# Patient Record
Sex: Male | Born: 1935 | Race: White | Hispanic: No | Marital: Married | Smoking: Former smoker
Health system: Southern US, Community
[De-identification: ages and names within clinical notes are randomized; demographics above are authoritative.]

## PROBLEM LIST (undated history)

## (undated) DIAGNOSIS — IMO0001 Reserved for inherently not codable concepts without codable children: Secondary | ICD-10-CM

## (undated) DIAGNOSIS — J189 Pneumonia, unspecified organism: Secondary | ICD-10-CM

## (undated) DIAGNOSIS — R350 Frequency of micturition: Secondary | ICD-10-CM

## (undated) DIAGNOSIS — R233 Spontaneous ecchymoses: Secondary | ICD-10-CM

## (undated) DIAGNOSIS — R0600 Dyspnea, unspecified: Secondary | ICD-10-CM

## (undated) DIAGNOSIS — R51 Headache: Secondary | ICD-10-CM

## (undated) DIAGNOSIS — R519 Headache, unspecified: Secondary | ICD-10-CM

## (undated) DIAGNOSIS — D649 Anemia, unspecified: Secondary | ICD-10-CM

## (undated) DIAGNOSIS — I25810 Atherosclerosis of coronary artery bypass graft(s) without angina pectoris: Secondary | ICD-10-CM

## (undated) DIAGNOSIS — E785 Hyperlipidemia, unspecified: Secondary | ICD-10-CM

## (undated) DIAGNOSIS — I639 Cerebral infarction, unspecified: Secondary | ICD-10-CM

## (undated) DIAGNOSIS — N390 Urinary tract infection, site not specified: Secondary | ICD-10-CM

## (undated) DIAGNOSIS — H9190 Unspecified hearing loss, unspecified ear: Secondary | ICD-10-CM

## (undated) DIAGNOSIS — R42 Dizziness and giddiness: Secondary | ICD-10-CM

## (undated) DIAGNOSIS — M542 Cervicalgia: Secondary | ICD-10-CM

## (undated) DIAGNOSIS — F03918 Unspecified dementia, unspecified severity, with other behavioral disturbance: Secondary | ICD-10-CM

## (undated) DIAGNOSIS — I10 Essential (primary) hypertension: Secondary | ICD-10-CM

## (undated) DIAGNOSIS — I6529 Occlusion and stenosis of unspecified carotid artery: Secondary | ICD-10-CM

## (undated) DIAGNOSIS — G8929 Other chronic pain: Secondary | ICD-10-CM

## (undated) DIAGNOSIS — F0391 Unspecified dementia with behavioral disturbance: Secondary | ICD-10-CM

## (undated) DIAGNOSIS — R238 Other skin changes: Secondary | ICD-10-CM

## (undated) DIAGNOSIS — R0602 Shortness of breath: Secondary | ICD-10-CM

## (undated) DIAGNOSIS — R569 Unspecified convulsions: Secondary | ICD-10-CM

## (undated) DIAGNOSIS — K219 Gastro-esophageal reflux disease without esophagitis: Secondary | ICD-10-CM

## (undated) DIAGNOSIS — M1990 Unspecified osteoarthritis, unspecified site: Secondary | ICD-10-CM

## (undated) HISTORY — DX: Gastro-esophageal reflux disease without esophagitis: K21.9

## (undated) HISTORY — DX: Atherosclerosis of coronary artery bypass graft(s) without angina pectoris: I25.810

## (undated) HISTORY — PX: COLONOSCOPY: SHX174

## (undated) HISTORY — DX: Anemia, unspecified: D64.9

## (undated) HISTORY — DX: Essential (primary) hypertension: I10

## (undated) HISTORY — DX: Unspecified osteoarthritis, unspecified site: M19.90

## (undated) HISTORY — DX: Occlusion and stenosis of unspecified carotid artery: I65.29

## (undated) HISTORY — DX: Hyperlipidemia, unspecified: E78.5

## (undated) HISTORY — DX: Cerebral infarction, unspecified: I63.9

---
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
## 1966-03-02 HISTORY — PX: APPENDECTOMY: SHX54

## 1996-03-02 HISTORY — PX: CARDIAC CATHETERIZATION: SHX172

## 1996-03-02 HISTORY — PX: CORONARY ARTERY BYPASS GRAFT: SHX141

## 1998-03-12 ENCOUNTER — Ambulatory Visit (HOSPITAL_COMMUNITY): Admission: RE | Admit: 1998-03-12 | Discharge: 1998-03-12 | Payer: Self-pay | Admitting: Gastroenterology

## 1999-03-08 ENCOUNTER — Encounter: Payer: Self-pay | Admitting: Emergency Medicine

## 1999-03-08 ENCOUNTER — Emergency Department (HOSPITAL_COMMUNITY): Admission: EM | Admit: 1999-03-08 | Discharge: 1999-03-08 | Payer: Self-pay | Admitting: Emergency Medicine

## 1999-04-02 ENCOUNTER — Ambulatory Visit (HOSPITAL_COMMUNITY): Admission: RE | Admit: 1999-04-02 | Discharge: 1999-04-02 | Payer: Self-pay

## 2001-10-24 ENCOUNTER — Ambulatory Visit (HOSPITAL_COMMUNITY): Admission: RE | Admit: 2001-10-24 | Discharge: 2001-10-24 | Payer: Self-pay | Admitting: Gastroenterology

## 2004-07-17 ENCOUNTER — Ambulatory Visit: Payer: Self-pay | Admitting: Cardiology

## 2005-09-16 ENCOUNTER — Ambulatory Visit: Payer: Self-pay | Admitting: Cardiology

## 2005-10-01 ENCOUNTER — Ambulatory Visit: Payer: Self-pay

## 2005-11-09 ENCOUNTER — Ambulatory Visit: Payer: Self-pay | Admitting: Cardiology

## 2006-03-10 ENCOUNTER — Ambulatory Visit: Payer: Self-pay

## 2006-03-10 ENCOUNTER — Ambulatory Visit: Payer: Self-pay | Admitting: Cardiology

## 2006-06-30 ENCOUNTER — Ambulatory Visit: Payer: Self-pay | Admitting: Cardiology

## 2007-07-14 ENCOUNTER — Ambulatory Visit: Payer: Self-pay | Admitting: Internal Medicine

## 2007-07-14 ENCOUNTER — Inpatient Hospital Stay (HOSPITAL_COMMUNITY): Admission: EM | Admit: 2007-07-14 | Discharge: 2007-07-19 | Payer: Self-pay | Admitting: Emergency Medicine

## 2007-07-15 ENCOUNTER — Encounter (INDEPENDENT_AMBULATORY_CARE_PROVIDER_SITE_OTHER): Payer: Self-pay | Admitting: Neurology

## 2007-07-18 ENCOUNTER — Ambulatory Visit: Payer: Self-pay | Admitting: *Deleted

## 2007-07-19 ENCOUNTER — Ambulatory Visit: Payer: Self-pay | Admitting: Physical Medicine & Rehabilitation

## 2007-07-26 ENCOUNTER — Encounter: Admission: RE | Admit: 2007-07-26 | Discharge: 2007-09-06 | Payer: Self-pay | Admitting: Neurology

## 2007-11-16 ENCOUNTER — Ambulatory Visit: Payer: Self-pay | Admitting: Cardiology

## 2008-02-08 ENCOUNTER — Ambulatory Visit: Payer: Self-pay | Admitting: Cardiology

## 2008-02-28 ENCOUNTER — Ambulatory Visit: Payer: Self-pay | Admitting: Vascular Surgery

## 2010-03-02 HISTORY — PX: EYE SURGERY: SHX253

## 2010-03-02 HISTORY — PX: CATARACT EXTRACTION W/ INTRAOCULAR LENS  IMPLANT, BILATERAL: SHX1307

## 2010-07-15 NOTE — Assessment & Plan Note (Signed)
[name]                            CARDIOLOGY OFFICE NOTE   [name], [name]                  MRN:          [id number]  DATE:11/16/2007                            DOB:          1931/06/11    PRIMARY CARE PHYSICIAN:  [name] Cradle, NP   REASON FOR PRESENTATION:  Evaluate the patient with coronary artery  disease and hospitalization earlier this year with a CVA.   HISTORY OF PRESENT ILLNESS:  The patient returns for followup after  being hospitalized in May with a right lenticular nucleus infarct  secondary to right internal carotid artery stenosis.  He was treated  with tPA at that time.  He actually has had little residual deficit.  He  has had some mild left-sided weakness, and a carotid Doppler  demonstrated that the right carotid stenosis, which was previously known  to be 60-80% stenosis with probably on a higher end of 80%.  The left  carotid stenosis was in the lower end of the 60-80% range.  He was to be  seen by the vascular surgeon, but he is yet to do this and wanted to be  seen here first.   He has actually recovered nicely.  He is walking daily.  He states that  with this level of activity, he is not having any chest discomfort, neck  discomfort, arm discomfort, activity-induced nausea or vomiting, or  excessive diaphoresis.  He has not been having any palpitation,  presyncope, or syncope.  He has had no PND or orthopnea.  He has had no  further neurologic complaints.  He has had no motor deficit, speech, or  visual problems.  He is having his newly diagnosed diabetes managed.   PAST MEDICAL HISTORY:  1. Coronary artery disease status post CABG in 1988 (SVG to the PDA,      SVG to the diagonal, SVG to the posterior lateral, and LIMA to the      LAD).  2. CVA as described.  3. Newly diagnosed diabetes mellitus.  4. Gastroesophageal reflux disease.  5. Hypertension.  6. Hyperlipidemia.  7. Appendectomy.   ALLERGIES:   None.   MEDICATIONS:  1. Niaspan 1000 mg daily.  2. Metoprolol 25 mg b.i.d.  3. Simvastatin 20 mg daily.  4. Omega-3.  5. Over-the-counter potassium.  6. Glyburide 2.5 mg b.i.d.  7. Meloxicam 15 mg daily.  8. Plavix 75 mg daily.   REVIEW OF SYSTEMS:  As stated in the HPI and otherwise negative for all  other systems.   PHYSICAL EXAMINATION:  GENERAL:  The patient is in no distress.  VITAL SIGNS:  Blood pressure 142/70, heart rate 79 and regular, weight  191 pounds, and body mass index 26.  HEENT:  Eyelids are unremarkable; pupils equal, round, and reactive to  light; fundi not visualized; oral mucosa unremarkable.  NECK:  No jugular venous distention at 45 degrees; carotid upstroke  brisk and symmetric; no bruits, no thyromegaly.  LYMPHATICS:  No cervical, axillary, and inguinal adenopathy.  LUNGS:  Clear to auscultation bilaterally.  BACK:  No costovertebral angle tenderness.  CHEST:  Unremarkable.  HEART:  PMI not displaced or sustained; S1 and S2 within normal limits;  no S3, no S4; no clicks, no rubs, and no murmurs.  ABDOMEN:  Flat; positive bowel sounds, normal in frequency and pitch; no  bruits, no rebound, no guarding; no midline pulsatile mass, no  hepatomegaly, no splenomegaly.  SKIN:  No rashes, no nodules.  EXTREMITIES:  2+ dorsalis pedis and posterior tibialis on the left, 1+  dorsalis pedis and absent post tibialis on the right; no cyanosis, no  clubbing, no edema.  NEURO:  Oriented to person, place, and time; cranial nerves II through  XII grossly intact; motor grossly intact.   ASSESSMENT AND PLAN:  1. Carotid stenosis.  The patient is now status post cerebrovascular      accident.  He has recovered nicely from this.  However, he does      need to be referred to the surgeons for probable right carotid      endarterectomy and then follow up for his left carotid stenosis.  I      will make this referal today.  He will continue with risk      reduction.  2.  Hypertension.  Blood pressure is slightly elevated.  I would like      him to be back on Altace and will start at 2.5 mg daily.  He will      continue the other meds as listed.  3. Coronary artery disease.  The patient is having no ongoing chest      pain.  He had negative stress perfusion study in 2007.  Therefore,      no further cardiovascular testing is suggested.  He would be at      acceptable risk for carotid endarterectomy.  He needs continued      risk reduction.  4. Dyslipidemia.  He had a good lipid profile.  We will have this      followed by [name].  The goals being LDL less than 70 and      HDL greater than 40.  5. Diabetes.  He has had weight loss and he is dieting.  He is on      medications and he is going to be followed by [name].  6. Claudication.  The patient does not have any ongoing claudication      in his walking.  This is being managed conservatively.  Of note, he      has an ABI of 0.76 on the right and 1.1 on the left.  This was done      in 2007.  7. Followup.  I would like to see him back in few months, but again      will be sending him to the surgeons for followup.     [name] Rotunda, MD, [name]  Electronically Signed    JH/MedQ  DD: 11/16/2007  DT: 11/17/2007  Job #: [id number]   cc:   [name] Cradle, NP

## 2010-07-15 NOTE — H&P (Signed)
NAMESALIOU, BARNIER           ACCOUNT NO.:  [id number]   MEDICAL RECORD NO.:  [id number]          PATIENT TYPE:  INP   LOCATION:  3113                         FACILITY:  [facility]   PHYSICIAN:  Gustavus Messing. Orlin Hilding, M.D.DATE OF BIRTH:  [birth year]   DATE OF ADMISSION:  07/14/2007  DATE OF DISCHARGE:                              HISTORY & PHYSICAL   CHIEF COMPLAINT:  Left-sided weakness.   This was occurred stroke that was called at 8:56 p.m.  Symptom onset was  approximately 7:30 p.m.  CT results were noted at 9:45, initial exam  9:45 p.m.   HISTORY OF PRESENT ILLNESS:  Mr. Rosencrans is a 77 year old right-handed  white man who has a history of hypertension who was in usual state of  health, sat down for dinner at 7:30, while eating he felt that his left  arm was not right, stood up and tried to walk and the left leg would not  work, 911 was called, and he was transferred to emergency room.   REVIEW OF SYSTEMS:  Please refer to the chart.  A 13-system review was  negative except for some occasional arthritic-type shoulder and hip  pain, severe urinary frequency, otherwise as in the history of present  illness.  No headache or vision problems.   PAST MEDICAL HISTORY:  Significant for:  1. Coronary artery disease, status post CABG in 1998.  2. Left carotid stenosis, which has been followed.  3. Hypertension.  4. Hyperlipidemia.  5. Remote appendectomy with gangrene.   MEDICATIONS:  1. Omega-3 fish oil 1 a day.  2. Metoprolol 50 mg half a tablet twice a day.  3. Simvastatin 20 mg nightly.  4. Niaspan ER 1000 mg q.h.s.  5. Aspirin 325 mg daily.   ALLERGIES:  No known drug allergies.   SOCIAL HISTORY:  He is married.  He is retired from [employer].   FAMILY HISTORY:  Positive for some vascular disease.   PHYSICAL EXAMINATION:  VITAL SIGNS:  On exam, blood pressure 142/79,  pulse 82, respirations 28, 97% sat.  HEENT:  Head is normocephalic and atraumatic.  Mucosa is moist.  NECK:   Supple without bruits.  HEART:  Regular rate and rhythm.  LUNGS:  Clear to auscultation.  ABDOMEN:  Obese and nontender.  SKIN:  Dry.  NEUROLOGIC:  He is awake and alert.  Answers 2 out of 2 questions  correctly.  Obeys 2 out of 2 commands correctly.  He has normal gaze.  No visual field loss.  No facial weakness, has a level 1 drift in the  left upper extremity.  No drift in the right upper extremity.  Level 3  drift in the left lower extremity.  No drift in the right lower  extremity.  No ataxia.  Mild decreased sensation to pinprick on the  left.  No aphasia.  No dysarthria.  He scores a 5 on the stroke scale.   CT scan of the brain was negative for any bleeding or anything acute.  NIH stroke scale was 5.  Modified Rankin is 0.  BMET is normal except  for glucose of 204 and there is  no history of diabetes.  CBC is normal.  INR was 1.0.   ASSESSMENT:  Right brain stroke, motor and sensory, but no cortical  features. I suspect a subcortical stroke, either periventricular,  internal capsular or pons.   PLAN:  IV tPA full dose; stroke workup with an MRI, MRA, carotid  Doppler, transcranial Doppler, 2D-echo; may need to put him on Aggrenox  or Plavix, since this occurred while on aspirin.  Stroke Team will  follow.      Catherine A. Orlin Hilding, M.D.  Electronically Signed     CAW/MEDQ  D:  07/14/2007  T:  07/15/2007  Job:  [id number]

## 2010-07-15 NOTE — Assessment & Plan Note (Signed)
[facility]                            CARDIOLOGY OFFICE NOTE   NAME:[name], [name]                  MRN:          [id number]  DATE:02/08/2008                            DOB:          12-13-1931    PRIMARY CARE PHYSICIAN:  Paulita Cradle, Nurse Practitioner.   REASON FOR PRESENTATION:  Evaluate the patient with coronary artery  disease and peripheral vascular disease and hypertension.   HISTORY OF PRESENT ILLNESS:  The patient is 77 years old.  He presents  for followup of the above.  At the last appointment, he was recovering  from a CVA.  He was supposed to see the Vascular Surgeons for  consideration of carotid endarterectomy as he has disease as described  below.  However, for some reason that did not happen.  He has had no  cardiovascular complaints since I last saw him.  He seems to have  recovered substantially from his CVA, though he has some slight memory  problems.  He denies any chest discomfort, neck or arm discomfort.  He  has had no palpitation, presyncope, or syncope.  He denies any PND or  orthopnea.  He says that he is walking routinely.  He is newly diagnosed  for diabetes and he is on medications for control of this.   PAST MEDICAL HISTORY:  1. Coronary artery disease, status post CABG in 1988 (SVG to the PDA,      SVG to diagonal, SVG to posterolateral, LIMA to LAD.  He had a      stress perfusion study most recently in August 2007.  This      demonstrated no evidence of ischemia or infarct).  2. Carotid stenosis (right carotid 60-80% probably on the higher end      of this.  Left carotid 60-80% probably on the lower end).  3. CVA (right lenticular nucleus infarct secondary to right internal      carotid artery stenosis).  4. Peripheral vascular disease (0.76 ABI on the right and 1.1 on the      left managed conservatively).  5. Diabetes mellitus newly diagnosed.  6. Gastroesophageal reflux disease.  7. Hypertension.  8. Hyperlipidemia.  9. Appendectomy.   ALLERGIES:  None.   MEDICATIONS:  1. Niaspan 1000 mg daily.  2. Metoprolol 25 mg b.i.d.  3. Simvastatin 20 mg daily.  4. Omega-3.  5. Glyburide 2.5 mg b.i.d.  6. Meloxicam 15 mg daily.  7. Plavix 75 mg daily.   REVIEW OF SYSTEMS:  As stated in the HPI and otherwise negative for  other systems.   PHYSICAL EXAMINATION:  GENERAL:  The patient is pleasant, in no  distress.  VITAL SIGNS:  Blood pressure 168/68, heart rate 78 and regular, weight  194 pounds, body mass index 26.  HEENT:  Eyes unremarkable.  Pupils equal, round, and reactive to light.  Fundi not visualized.  Oral mucosa unremarkable.  NECK:  No jugular venus distention at 45 degrees, carotid upstroke brisk  and symmetrical.  No bruits.  No thyromegaly.  LYMPHATICS:  No cervical, axillary, or inguinal adenopathy.  LUNGS: Clear to auscultation bilaterally.  BACK:  No costovertebral angle tenderness.  CHEST:  Well-healed sternotomy scar.  HEART:  PMI not displaced or sustained, S1 and S2 are within normal  limits.  No S3, no S4.  No clicks, no rubs, no murmurs.  ABDOMEN:  Obese mildly, positive bowel sounds, normal in frequency and  pitch.  No bruits, no rebound, no guarding.  No midline pulsatile mass.  No organomegaly,  no splenomegaly.  SKIN:  No rashes, no nodules.  EXTREMITIES:  2+ pulses throughout, edema, no cyanosis, no clubbing.  NEUROLOGIC:  Oriented to person, place, and time.  Cranial nerves II  through XII are grossly intact.  Motor grossly intact.   ASSESSMENT/PLAN:  1. Carotid stenosis.  Today, he will leave the office with an      appointment in hand for evaluation for probable carotid      endarterectomy.  He will remain on the medications as listed.  2. Hypertension.  I am going to increase his metoprolol to 50 mg      b.i.d.  I am doing this in lieu of the Altace, which he was to      start at the last appointment.  For some reason, this did not      happen  either.  I suspect that he has some confusion, which started      to rate things down for him.  At this point, I think the beta-      blocker might be a better choice for the benefits with preoperative      management when started long-term before higher risk surgeries.  3. Coronary disease.  There had been no ongoing chest pain.  No      further cardiovascular testing is suggested.  He would be at      acceptable risk for the carotid endarterectomy.  4. Dyslipidemia, per Paulita Cradle with goal LDL less than 17, HDL      greater than 40.  5. Claudication.  The patient is having no new symptoms.  We will      continue with conservative management and risk reduction.  6. Diabetes, per Paulita Cradle.  7. Followup.  I would see him back after the carotid endarterectomy if      indeed this is done.  Otherwise, I will see him back in about 3      months.     Rollene Rotunda, MD, [facility]  Electronically Signed    JH/MedQ  DD: 02/08/2008  DT: 02/09/2008  Job #: [id number]   cc:   Paulita Cradle, NP

## 2010-07-15 NOTE — Discharge Summary (Signed)
[name], [name]           ACCOUNT NO.:  [id number]   MEDICAL RECORD NO.:  [id number]          PATIENT TYPE:  INP   LOCATION:  3034                         FACILITY:  [facility]   PHYSICIAN:  Annie Main, N.P.      DATE OF BIRTH:  01-06-1932   DATE OF ADMISSION:  07/14/2007  DATE OF DISCHARGE:  07/19/2007                               DISCHARGE SUMMARY   DIAGNOSES AT TIME OF DISCHARGE:  1. Right lenticular nucleus infarct secondary to right internal      carotid artery stenosis.  2. Bilateral internal carotid artery stenosis right greater than left.  3. Diabetes, new diagnosis.  4. Coronary artery disease with coronary artery bypass grafting in      1998.  5. Known left carotid stenosis.  6. Hypertension.  7. Hyperlipidemia.  8. Remote appendectomy with gangrene in 1968.   MEDICINE AT THE TIME OF DISCHARGE:  1. Metoprolol 50 mg once a day.  2. Niaspan 1000 mg at bedtime.  3. Simvastatin 20 mg a day.  4. Fish oil 1 g a day.  5. Plavix 75 mg a day.  6. Glyburide 2.5 mg b.i.d.   STUDIES PERFORMED:  1. CT of the brain on admission shows no acute abnormality.  2. MRI of the brain shows small acute nonhemorrhagic infarct in the      posterior aspect of the right lenticular nucleus.  Small-vessel      disease.  Old small left cerebellar infarct.  Paranasal sinus and      mastoid air cell mucosal thickening/partial opacification.  3. MRA of the head shows mild-to-moderate intracranial atherosclerotic      type changes.  4. CT angio of the head first.  Atherosclerotic calcification present      in the cavernous carotids bilaterally with mild-to-moderate      stenosis on the right, no significant stenosis in circle of Willis.      Both vertebral arteries are patent including the basilar.  There is      no intracranial aneurysm.  5. CTA of the neck shows a 60% stenosis right ICA, 50% stenosis left      ICA.  Carotid Doppler shows right greater than 80% stenosis, left      with  60-80% ICA stenosis.  6. Transcranial Doppler performed, results pending.  7. Two-D echocardiogram shows EF of 65-75% with no left ventricular      regional wall motion abnormalities.  No embolic source.  8. EKG shows normal sinus rhythm.   LABORATORY STUDIES:  Cholesterol 119, triglycerides 53, HDL 23, LDL 85.  Urinalysis with 0 to 2 white blood cells, 3 to 6 red blood cells.  Homocystine 9.5, hemoglobin A1c 7.3.  Coagulation studies on admission  normal.  CBC normal.  Chemistry with glucose 204, albumin 3.4,  otherwise, normal.   HISTORY OF PRESENT ILLNESS:  [name] is a 77 year old  right-handed Caucasian male with a history of hypertension who was in  his usual state of health when he sat down for dinner at 7:30 p.m.  While eating, he felt that his left arm was not right, he stood  up and  tried to walk and the left leg would not support him.  911 was called  and he was transferred to the [facility] Emergency Room for evaluation.  His NIH stroke scale was 5 and he was felt to be an intravenous t-PA  candidate.  He was given full-dose intravenous t-PA and admitted to the  neuro intensive care.   HOSPITAL COURSE:  MRI supported a new right lenticular form of infarct  which was found to be secondary to right ICA stenosis.  The patient had  no left ICA stenosis, but now right ICA stenosis is greater than left.  CBTS was consulted.  They evaluated and will likely plan surgery in the  right ICA in the next several weeks.  Related to stroke, he has vascular  risk factors of new diagnosis of diabetes, as well as dyslipidemia and  coronary artery disease and hypertension.  He was placed on Plavix for  secondary stroke prevention.  Glyburide was added for diabetes  treatment.  He began his diabetes education, will need to follow up with  his primary care physician.  Overall, he was ambulating relatively well,  is too high-level to go to inpatient rehab.  They recommended  outpatient  therapy followup for him and that will be arranged.   CONDITION ON DISCHARGE:  The patient alert and oriented x3.  No aphasia.  No dysarthria.  His eye movements are full.  He has no drift.  He has  decreased fine motor movement in his left.  He has mild left hematemesis  in his right arm, orbits his left.  He has mild left lower leg weakness.  His breath sounds are clear.  His heart rate is regular.   DISCHARGE/PLAN:  1. Discharge the patient home with family.  2. Outpatient PT and OT.  3. Plavix for secondary stroke prevention.  4. Follow up with Liliane Bade within 1 month for potential right      carotid endarterectomy.  5. Follow up with primary care physician related to new diagnosis of      diabetes.  6. Follow up Dr. Delia Heady as needed in 3-4 months.      Annie Main, N.P.     SB/MEDQ  D:  07/19/2007  T:  07/19/2007  Job:  [id number]   cc:   Pramod P. Pearlean Brownie, MD  P. Liliane Bade, M.D.

## 2010-07-15 NOTE — Consult Note (Signed)
VASCULAR SURGERY CONSULTATION   [name], [name]  DOB:  October 11, 1931                                       02/28/2008  CHART#:[id number]   I saw the patient in the office today in consultation concerning his  carotid disease.  He was referred by Dr. Antoine [name].  This is a pleasant  77 year old right-handed gentleman with known mild carotid disease.  He  had a carotid duplex scan in January of 2008 at the [facility] office which  showed evidence of a 40-59% right carotid stenosis and a 40-59% left  carotid stenosis.  This is based on velocity criteria.  He subsequently  had a right hemispheric stroke in May of 2009 which was associated with  left arm and left lower extremity weakness.  He had an infarct in the  right lenticular nucleus and underwent successful TPA at that time.  His  symptoms lasted approximately 3 days and essentially resolved completely  with no significant residual deficit.  During that hospitalization his  carotid duplex scan showed evidence of a 60-79% right carotid stenosis.  He also had evidence of a 60-79% left carotid stenosis.  He has been on  Plavix since his stroke and has done well with no further neurologic  symptoms.  Specifically he denies any history of TIAs, expressive or  receptive aphasia or amaurosis fugax.  He was sent today for vascular  consultation.   PAST MEDICAL HISTORY:  1. Significant for coronary artery disease.  He underwent coronary      revascularization in 1988.  2. Recently diagnosed diabetes.  3. Hypertension.  4. Hypercholesterolemia.  5. History of gastroesophageal reflux disease.  6. He denies any history of previous myocardial infarction, history of      congestive heart failure or history of COPD.   FAMILY HISTORY:  There is no history of premature cardiovascular  disease.   SOCIAL HISTORY:  He is married with three children.  He is retired.  He  quit tobacco in 1976.   REVIEW OF SYSTEMS AND MEDICATIONS:   Are documented on the medical  history form in his chart.   PHYSICAL EXAMINATION:  General:  A 77 year old gentleman who appears his  stated age.  HEENT:  Unremarkable.  Neck:  Supple.  There is no cervical  lymphadenopathy.  I do not detect any carotid bruits.  Lungs:  Are clear  bilaterally to auscultation.  Cardiac:  He has a regular rate and  rhythm.  Abdomen:  Soft and nontender.  I cannot palpate an aneurysm.  He has palpable femoral and pedal pulses bilaterally with no significant  lower extremity swelling.  Neurological:  He has good strength in the  upper extremities and lower extremities bilaterally.   Duplex scan in our office today shows evidence of bilateral 40-59%  carotid stenoses.  The velocities are clearly less compared to the study  at [facility] in May.   I have explained that based on his carotid duplex scan today that I  would not recommend carotid endarterectomy based on this information  alone.  It is possible that he had a significant stenosis in May and had  embolic disease and this plaque ultimately healed and now he has a  significantly lesser degree of stenosis.  He has had no further symptoms  on Plavix and I think it would be reasonable to simply  repeat his duplex  scan in 6 months and only consider right carotid endarterectomy if the  stenosis progressed to greater than 80%.  The second option would be to  proceed with cerebral arteriography in order to determine if he has  significant ulceration on the right which might put him at risk for  stroke in which case consideration could be given to carotid  endarterectomy.  If the arteriogram showed a smooth moderate stenosis  then again certainly I would not recommend carotid endarterectomy.  He  understands the risk with cerebral arteriography includes a 1-2% risk of  stroke.  After extensive discussion with the patient and his wife they  are more comfortable with simply checking this again in 6 months and I   think this is perfectly reasonable.  As he has had previous Doppler  studies at the [facility] office we will ask Dr. Antoine [name] to arrange his  followup carotid duplex scan in 6 months and then I can see him back  after his study.  If the [place] office would prefer we would be happy  to arrange for his duplex scan here.  He does know to continue taking  his Plavix.   [name], M.D.  Electronically Signed  CSD/MEDQ  D:  02/28/2008  T:  02/29/2008  Job:  [id number]   cc:   [name] Rotunda, MD, [facility]

## 2010-07-15 NOTE — Procedures (Signed)
CAROTID DUPLEX EXAM   INDICATION:  Follow-up evaluation of known carotid artery disease.   HISTORY:  Diabetes:  Yes.  Cardiac:  Coronary artery bypass graft x4.  Hypertension:  Yes.  Smoking:  No.  Previous Surgery:  No.  CV History:  Previous carotid duplex revealed 60-79% right ICA stenosis  (high end of range) and 60-79% left ICA stenosis (low end of range) on  March 10, 2006.  Patient had a stroke in May of 2009.  Amaurosis Fugax No, Paresthesias No, Hemiparesis No.                                       RIGHT             LEFT  Brachial systolic pressure:         156               158  Brachial Doppler waveforms:         Triphasic         Triphasic  Vertebral direction of flow:        Antegrade         Antegrade  DUPLEX VELOCITIES (cm/sec)  CCA peak systolic                   78                87  ECA peak systolic                   156               142  ICA peak systolic                   161               161  ICA end diastolic                   54                59  PLAQUE MORPHOLOGY:                  Mixed             Mixed  PLAQUE AMOUNT:                      Moderate          Moderate  PLAQUE LOCATION:                    Proximal ICA      Proximal ICA   IMPRESSION:  1. A 40-59% ICA stenosis bilaterally.  2. Velocities are stable compared to previous studies including study      performed on Jul 15, 2007, at [facility].   ___________________________________________  Di Kindle. Edilia Bo, M.D.   MC/MEDQ  D:  02/28/2008  T:  02/28/2008  Job:  [id number]

## 2010-07-18 NOTE — Consult Note (Signed)
. [facility]  Patient:    [name]                   MRN: [id number] Proc. Date: 03/08/99 Adm. Date:  [id number] Attending:  Otilio Connors [name] CC:         [name], [facility], [place], [place]             Rollene [name], M.D. [facility]                          Consultation Report  REASON FOR CONSULTATION:  I have been asked by [name] of the emergency  department to evaluate [name] for shortness of breath and chest discomfort.  HISTORY OF PRESENT ILLNESS:  [name] is status post coronary artery bypass surgery for a positive stress test and dyspnea on exertion a year and a half ago. At that time he had coronary artery bypass surgery by [name] x 4. He is followed now by [name] in [place].  [name] performed a stress test a year after his surgery, which sounds like a stress Cardiolite. Apparently this was normal according to his family.  He has had no exertional angina or no  exertional dyspnea.  This Wednesday, he developed upper respiratory tract infection manifested by nasal stuffiness.  It has been clear in nature.  He denies any fever.  There has been no cough or hemoptysis.  Over the last couple of days when he lies down, he develops a sharp twinge in his chest and then at other times a dull ache with some shortness of breath.  He says it is clearly up in his nose and airway.  He has a lot of postnasal drainage. e saw [name] at [facility], who placed him on Allegra 60 mg and a pharmacist gave him Afrin nasal spray.  It has not helped.  He is due to come off the Afrin tomorrow per his pharmacist.  He came to the emergency room tonight after initially calling us but we could not get through on the number he left.  He arrived about 1 in the morning.  His vital signs when he arrived were stable.  He was in no acute distress.  His  initial  laboratory data showed a negative CPK and a negative troponin.  ECG showed normal sinus rhythm with a very slight intraventricular conduction delay, though no ST segment changes.  He had normal sinus rhythm.  His CPK and troponins were negative x 1.  Chest x-ray showed no acute disease.  I was asked to see him to rule out angina or an acute coronary syndrome.  CURRENT MEDICATIONS: 1. Metoclopramide q.i.d. 2. Aspirin a day. 3. Metoprolol b.i.d. 4. Zocor 20 mg a day. 5. Prilosec 20 mg a day. 6. He is also on Allegra 60 mg a day. 7. Afrin nasal spray.  PHYSICAL EXAMINATION:  SKIN:  His skin is warm and dry.  GENERAL:  He is in no acute distress.  VITAL SIGNS: Blood pressure was156/80, pulse 69 and regular, respirations 18 and unlabored, temperature 97.4.  His saturations were 95%.  NECK:  Carotid upstrokes were equal bilaterally without bruits.  There was no JVD. There was no lymphadenopathy.  LUNGS:  His lungs were clear to auscultation and percussion.  CHEST:  Chest examination revealed no chest wall tenderness.  He had normal S1 nd  S2 without gallop, rub, or murmur.  ABDOMEN:  Soft.  EXTREMITIES:  Reveal no edema.  LABORATORY DATA:  Showed a white count of 6.1, hemoglobin of 14.3.  CPK total of 106, MB of 3.2.  Troponin of 0.05.  ASSESSMENT:  Upper respiratory viral illness with nasal stuffiness and postnasal drip causing shortness of breath and some chest tightness.  The fact that this s not associated with exertion actually is relieved with getting up and walking around, and his temperature related to this infection, I do not think it is cardiac.  His ECG is unremarkable.  His examination is unremarkable.  His chest  x-ray is clear and his CPK enzymes and troponins are negative.  PLAN: 1. I have changed his Allegra to Allegra-D 60 b.i.d. for seven days. 2. I have stopped his Afrin nasal spray. 3. He will have saline nasal spray. 4. Followup with [name]  and [name] p.r.n. DD:  03/08/99 TD:  03/08/99 Job: [id number]

## 2010-07-18 NOTE — Assessment & Plan Note (Signed)
[facility]                              CARDIOLOGY OFFICE NOTE   NAME:[name], [name]                  MRN:          [id number]  DATE:09/16/2005                            DOB:          1931-05-05    PRIMARY CARE PHYSICIAN:  Dr. Montey Hora.   REASON FOR PRESENTATION:  Evaluate the patient with coronary disease and  dizziness.   HISTORY OF PRESENT ILLNESS:  The patient is now 77 years old. He presents  for followup and reports that he has had recent onset dizziness. He  describes these spells particularly when he moves from a lying to seated or  standing position. It also happens if he turns, though this is not as  reproducible. He feels dizzy for several seconds before it goes away. He  said this is very similar to symptoms he had before his bypass. He never had  any angina and still does not describe any chest discomfort, neck  discomfort, arm discomfort, activity induced nausea, vomiting, or excessive  diaphoresis. He does not notice any palpitations. He has had no shortness of  breath. Denies any PND or orthopnea. He has had some leg pain, particularly  in his right hip. Also in his right leg slightly with walking. He also has  some resting tingling and pain in his right greater than left leg.   PAST MEDICAL HISTORY:  1.  Coronary artery disease status post CABG in 1998 (SVG to the PDA, SVG to      diagonal, SVG to posterolateral, and LIMA to the LAD).  2.  Gastroesophageal reflux disease.  3.  Hypertension.  4.  Hyperlipidemia.  5.  Appendectomy.   ALLERGIES:  No known drug allergies.   MEDICATIONS:  1.  Multivitamin.  2.  Aspirin 325 mg daily.  3.  Prilosec 20 mg daily.  4.  Metoprolol 25 mg b.i.d.  5.  Zocor 20 mg daily.  6.  Altace 2.5 mg daily.  7.  Metoclopramide 10 mg b.i.d.  8.  Niaspan 500 mg b.i.d.   REVIEW OF SYSTEMS:  As stated in the HPI. Otherwise, negative for other  systems.   PHYSICAL EXAMINATION:   GENERAL:  The patient is in no distress.  VITAL SIGNS:  Blood pressure 111/88, heart rate 74 and regular. Weight 197  pounds.  HEENT:  Eye exam unremarkable. Pupils equally, round and reactive to light.  Fundi visualized. Oral mucosa unremarkable.  NECK:  No jugular venous distention at 45 degrees. Carotid upstroke brisk  and symmetric. No bruits, thyromegaly.  LYMPHATICS:  No cervical, axillary, or inguinal adenopathy.  LUNGS:  Clear to auscultation bilaterally.  BACK:  No costovertebral angle tenderness.  CHEST:  Unremarkable.  HEART:  PMI not displaced or sustained, S1 and S2 within normal limits. No  S3, no S4, no murmurs.  ABDOMEN:  Flat. Positive bowel sounds. Normal in frequency and pitch. No  bruits, rebound, guarding. No midline pulsatile mass, hepatosplenomegaly, or  splenomegaly.  SKIN:  No rashes, no nodules.  EXTREMITIES:  2+ pulses throughout, no edema, no cyanosis, no clubbing.  NEUROLOGIC:  Oriented to person, place, and  time. Cranial nerves II-XII are  grossly intact. Motor grossly intact.  SKIN:  No rashes or nodules.  2+ upper pulses, 1+ dorsalis pedis and post  tibialis on the left, absent dorsalis pedis and post tibialis on the right,  no cyanosis, clubbing, no edema.   ASSESSMENT/PLAN:  1.  Dizziness:  The patient has dizziness similar to previous angina. He was      not orthostatic, which I would have expected given his symptoms. It has      been several years since his bypass. Therefore, I will go ahead and get      an exercise Cardiolite on him. Other evaluation will be as described      below.  2.  Peripheral vascular disease:  The patient clearly has reduced pulses and      some symptoms consistent with claudication in his right leg in      particular. He also had some mild plaque in his carotids in the past.      Therefore, I am going to get carotid Doppler's. At the same time I will      get ABI's to evaluate possible peripheral vascular disease.  3.   Dyslipidemia:  He had a reasonable lipid profile followed by Dr. Dimple Casey.      He will continue the medications as listed.  4.  Followup:  I will see him back based on the results of the above. If all      of the above testing is normal I might empirically prescribe some      compression stockings to see if that helps his dizziness. Alternatively,      he may need referral for a possible inner ear problem.                               Rollene Rotunda, MD, [facility]    JH/MedQ  DD:  09/16/2005  DT:  09/16/2005  Job #:  [id number]   cc:   Magnus Sinning. Rice, MD

## 2010-07-18 NOTE — Assessment & Plan Note (Signed)
[facility]                            CARDIOLOGY OFFICE NOTE   NAME:[name], [name]                  MRN:          [id number]  DATE:06/30/2006                            DOB:          September 11, 1931    PRIMARY CARE PHYSICIAN:  [facility].   REASON FOR PROCEDURE:  Evaluate the patient for coronary disease.   HISTORY OF PRESENT ILLNESS:  The patient presents for follow up.  He is  77 years old.  He has had no new symptoms since I last saw him but he  continues to have some dizziness, but without presyncope or syncope.  This has been a chronic problem.  It happens sometimes when he turns his  head.  He also has some symptoms consistent with orthostasis.  It has  been mild.  He has had no chest discomfort, neck or arm discomfort since  I last saw him.  He has had no palpitations.  He has had no shortness of  breath.  Denies any PND or orthopnea.  He walks when the weather is  nice.   He did see Dr. Samule Ohm because of some decreased lower extremity pulses.  He was noted to have carotid disease as described.  His peripheral  disease was mild and responded to cilostazol.  No further workup is  planned.   PAST MEDICAL HISTORY:  1. Coronary artery disease status post CABG in 1988 (SVG to the PDA,      SVG to diagonal, SVG to posterolateral, LIMA to the LAD).  2. Gastroesophageal reflux disease.  3. Hypertension.  4. Hyperlipidemia.  5. Appendectomy.   ALLERGIES:  None.   MEDICATIONS:  1. Cilostazol 100 mg b.i.d.  2. Niaspan 1000 mg daily.  3. Toprol 25 mg b.i.d.  4. Metoclopramide 10 mg daily.  5. Altace 2.5 mg daily.  6. Omeprazole 20 mg daily.  7. Simvastatin 20 mg daily.  8. Omega 3.  9. OTC.  10.Aspirin 325 mg daily.   REVIEW OF SYSTEMS:  As stated in the HPI, and otherwise, negative for  other systems.   PHYSICAL EXAMINATION:  GENERAL APPEARANCE: The patient is in no  distress.  VITAL SIGNS:  Blood pressure  130/70, heart rate 53 and regular, weight  201 pounds, body mass index 27.  HEENT:  Eyes:  Unremarkable.  Pupils equal, round and reactive to light.  Fundi not visualized.  Oral mucosa unremarkable.  NECK:  No jugular venous distention.  Wave form within normal limits.  Carotid upstrokes brisk and symmetric.  No bruits or thyromegaly.  LYMPHATICS.  No cervical, axillary or inguinal adenopathy.  LUNGS:  Clear to auscultation bilaterally.  BACK:  No costovertebral angle tenderness.  CHEST:  Unremarkable.  HEART:  PMI not displaced or sustained.  S1, S2 within normal limits.  No S3, S4, no clicks, rubs, murmurs.  ABDOMEN:  Flat, positive bowel sounds.  Normal in frequency and pitch.  No bruits, rebound, guarding.  No midline pulsatile mass.  No  organomegaly.  SKIN:  No rashes, no nodules.  EXTREMITIES:  Pulses 2+ upper, 2+ dorsalis and posterior tibialis on the  left, absent posterior tibialis and dorsalis pedis on the right.  NEUROLOGICAL:  Oriented to person, place and time.  Cranial nerves II-  XII grossly intact. Motor grossly intact.   LABORATORY DATA:  EKG sinus bradycardia, rate 53, axis within normal  limits, intervals within normal limits, no acute ST-T wave changes.   ASSESSMENT:  1. Dizziness.  This is a chronic problem.  It was similar to an      anginal symptom that he had, but we evaluated this with a stress      test last year, and there was no evidence of ischemia.  It is not      particularly problematic.  No further evaluation will be warranted      at this point.  2. Peripheral vascular disease.  He will continue his Pletal.  He will      continue the risk reduction.  Of note, he does have carotid      stenosis which is 60-79% bilaterally, and he is due to have follow      up carotids this summer.  I reminded him of this.  3. Dyslipidemia.  We will get a lipid profile as it has been awhile.      We will check some routine blood work.  I have encouraged him to       follow with his primary care doctors.  4. Coronary disease.  He will continue secondary to risk reduction.  I      have encouraged exercise 5 days a week.  5. Follow up.  We will see him back in one year or sooner if needed.     Rollene Rotunda, MD, [facility]  Electronically Signed    JH/MedQ  DD: 06/30/2006  DT: 06/30/2006  Job #: [id number]   cc:   [facility]

## 2010-07-18 NOTE — Progress Notes (Signed)
[facility]                        PERIPHERAL VASCULAR OFFICE NOTE   NAME:[name], [name] KUENZI                  MRN:          [id number]  DATE:03/10/2006                            DOB:          September 25, 1931    PRIMARY CARE PHYSICIAN:  [name], M.D.   HISTORY OF PRESENT ILLNESS:  [name] is a 77 year old gentleman with  atherosclerotic peripheral arterial disease. I met him for this in  September. We found him to have a severe focal stenosis in the mid  portion of the right superficial femoral artery. Right ABI is 0.76 and  left is 1.1. I recommended Pletal and increasing his daily exercise. He  has been compliant with both. He now is essentially asymptomatic with  respect to claudication.   He does continue to have some occasional episodes of light-headedness,  particularly when arising from standing. Evaluation previously has shown  him to not be orthostatic. Carotid duplex performed this morning shows  mild disease bilaterally and antegrade flow in both vertebrals. There is  no evidence of subclavian stenosis. Thus, it does not appear that he has  any arterial insufficiency to cause his light-headedness.   CURRENT MEDICATIONS:  1. Pletal 100 mg twice per day.  2. Niaspan ER 100 mg per day.  3. Metoprolol 25 mg twice per day.  4. Metoclopramide 10 mg per day.  5. Altace 2.5 mg per day.  6. Omeprazole 20 mg per day.  7. Simvastatin 20 mg per day.  8. Omega 3 1000 mg per day.  9. Over-the-counter potassium.   PHYSICAL EXAMINATION:  GENERAL:  Well appearing.  VITAL SIGNS:  Heart rate 84, blood pressure 139/84 on the right and  120/70 on the left.  NECK:  He has no jugular venous distention, thyromegaly or  lymphadenopathy.  LUNGS:  Clear to auscultation.  HEART:  He has a nondisplaced point of maximal cardiac impulse. There is  a regular rate and rhythm without murmur, rub or gallop.  ABDOMEN:  Soft, nondistended, nontender.  There is no hepatosplenomegaly.  Bowel sounds are normal. There is no midline pulsatile mass.  EXTREMITIES:  Warm without clubbing, cyanosis, edema or ulceration.  Carotid pulses 2+ bilaterally without bruit. Femoral pulses 2+  bilaterally without bruit. Left DP, PT and DP pulses all 2+. On the  right popliteal, DP and PT pulses are not palpable.   IMPRESSION/RECOMMENDATIONS:  1. Lower extremity vascular disease:  Now asymptomatic on Pletal and      exercise therapy. He will continue this. He is very pleased with      this result. Given his asymptomatic status and      the likelihood that he will remain stable, I have not scheduled him      for followup with me.  2. Light-headedness:  The patient feels it is improving on its own. I      will not assess any further.     [name] Farber, MD  Electronically Signed    WED/MedQ  DD: 03/10/2006  DT: 03/10/2006  Job #: [id number]   cc:   [name], M.D.  [name] Rotunda, MD, [facility]

## 2010-07-18 NOTE — Progress Notes (Signed)
[facility]                          PERIPHERAL VASCULAR OFFICE NOTE   NAME:[name], [name]                  MRN:          [id number]  DATE:11/09/2005                            DOB:          1931-06-26    PRIMARY CARE PHYSICIAN:  Magnus Sinning. Rice, M.D.   REASON FOR CONSULTATION:  Patient referred by Dr. Antoine Poche for right leg  claudication.   HISTORY OF PRESENT ILLNESS:  [name] is a 77 year old gentleman with  atherosclerotic coronary disease.  For the past 8 months he has had  discomfort in his right calf with walking.  He has not let this affect his  activity at all.  He continues to hike at [employer] with his grandson.  He does have to stop after walking about half a mile.  Symptoms abate within  a minute or two and he is able to keep walking.  He does have some numbness  in his right foot at night, which he thinks is new.  He has had no other  resting discomfort in his right foot and has certainly had no tissue loss.  He does have chronic chilblains related to frostbite in his toes, which  occurred decades ago.  He is able to clearly distinguish this new symptom  from that.   PAST MEDICAL HISTORY:  1. Coronary artery disease, status post CABG in 1998.  Ejection fraction      53%.  2. He is status post appendectomy in 1968.  3. He has GERD.  4. Hypertension.  5. Hypercholesterolemia.  6. Crush injury of the right foot and ankle in 1985.   ALLERGIES:  No known drug allergies.   CURRENT MEDICATIONS:  1. Multivitamin.  2. Aspirin 325 mg per day.  3. Prilosec 20 mg per day.  4. Metoprolol 25 mg twice per day.  5. Zocor 20 mg per day.  6. Altace 2.5 mg per day.  7. Metoclopramide 10 mg twice per day.  8. Niaspan 500 mg twice per day.  9. Iron 325 mg per day.  10.Over-the-counter potassium.   SOCIAL HISTORY:  The patient is retired from [employer]  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  He enjoys  walking and hiking.  He is married with 3 grown children.  He quit smoking  31 years ago.  He denies alcohol and illicit drug use.   FAMILY HISTORY:  Father died of heart failure at 102.  Mother is alive at 50  after having had a stroke.  Seven siblings are alive and well, as are his 3  children.  A brother died at 54 in a car accident.   REVIEW OF SYSTEMS:  Decreased hearing.  Occasional dizziness.  Occasional  rash on his face.  Occasional edema on the side of his saphenous vein graft  harvest.  Review of systems is otherwise negative in detail except as above.   PHYSICAL EXAMINATION:  GENERAL:  On physical examination, [name] is a  very robust-appearing man in no distress.  VITAL SIGNS:  Heart rate 77, blood pressure 144/72 on the  right, 138/79 on  the left.  He is 5 feet 11 inches tall and weighs 201 pounds.  HEENT:  Normal.  SKIN:  Normal.  MUSCULOSKELETAL:  Normal.  NECK:  There is no jugular venous distention, thyromegaly or  lymphadenopathy.  LUNGS:  Clear to auscultation.  CARDIAC:  He has a nondisplaced point of maximal cardiac impulse.  There is  a regular rate and rhythm without murmur, rub, or gallop.  ABDOMEN:  Soft, nondistended, nontender.  There is no hepatosplenomegaly.  There is no pulsatile midline mass.  No abdominal bruits.  EXTREMITIES:  Warm without clubbing, cyanosis, edema, or ulceration.  Hair  growth is preserved on both feet.  PERIPHERAL VASCULAR:  Carotid pulses 2+ bilaterally without bruit.  Radial  pulses 2+ bilaterally.  Femoral pulses 2+ bilaterally without bruit.  Left  popliteal, DP and PT pulses are all 2+.  On the right, the popliteal, DP and  PT are not palpable.  NEUROLOGIC:  He is alert and oriented x3 with normal affect and normal  neurologic exam.   Lower extremity duplex examination performed October 01, 2005, demonstrates an  ABI on the right of 0.76 and on the left of 1.1.  He has a focal severe  stenosis in the  mid-SFA on the right.   IMPRESSION/RECOMMENDATIONS:  The patient has clear atherosclerotic  peripheral arterial disease, which is contributing to his claudication.  His  claudication is not lifestyle-limiting.  He already exercises 2 days per  week.  I have asked him to increase this to 5 days per week.  Will try  cilostazol for improvement of symptomatology.  I will then see him back in 3  months' time.   Thank you for the opportunity to participate in his care.                                   Salvadore Farber, MD   WED/MedQ  DD:  11/09/2005  DT:  11/10/2005  Job #:  [id number]   cc:   Rollene Rotunda, MD, [facility]  Magnus Sinning. Rice, M.D.

## 2010-07-18 NOTE — Op Note (Signed)
NAMERAJINDER, MESICK                     ACCOUNT NO.:  [id number]   MEDICAL RECORD NO.:  [id number]                   PATIENT TYPE:  AMB   LOCATION:  ENDO                                 FACILITY:  [facility]   PHYSICIAN:  Petra Kuba, M.D.                 DATE OF BIRTH:  [birth year]   DATE OF PROCEDURE:  10/24/2001  DATE OF DISCHARGE:                                 OPERATIVE REPORT   PROCEDURE:  Colonoscopy.   INDICATIONS:  Patient with a history of colon polyps, due for repeat  screening, some increased constipation.  Consent was signed after risks,  benefits, methods, and options thoroughly discussed in the office.   MEDICATIONS:  Demerol 40 mg, Versed 4 mg.   DESCRIPTION OF PROCEDURE:  Rectal inspection was pertinent for external  hemorrhoids.  Digital exam was negative.  The video pediatric adjustable  colonoscope was inserted and with some difficulty due to a tortuous,  diverticulum-filled sigmoid.  Once through this area, we were easily able to  advance around the colon to the cecum.  This did not require any abdominal  pressure or any position changes.  No other abnormalities were seen on  insertion.  The cecum was identified by the appendiceal orifice and the  ileocecal valve.  In fact, the scope was inserted a short way in the  terminal ileum, which was normal.  Photo documentation was obtained.  Prep  on the right side was fairly adequate.  There was some stool adherent to the  wall of the colon, lots of washing and suctioning, no obvious medium or  large lesion was seen.  Tiny lesions could have been missed.  The scope was  slowly withdrawn.  The rest of the prep was adequate.  On slow withdrawal  back to the colon, other than sigmoid diverticula with tortuosity, no other  abnormalities were seen.  Once back in the rectum the scope was then  retroflexed, pertinent for some internal hemorrhoids.  The scope was  straightened and readvanced a short way up the left  side of the colon, air  was suctioned, and the scope removed.  The patient tolerated the procedure  well.  There was no obvious immediate complications.   ENDOSCOPIC DIAGNOSES:  1. Internal-external small hemorrhoids.  2. Sigmoid diverticula and tortuosity.  3. Otherwise within normal limits to the terminal ileum.   PLAN:  Happy to see back p.r.n.  Return care to Dr. Reola Calkins for the customary  health care maintenance to include yearly rectals and guaiacs, and otherwise  repeat screening in five years if doing well medically.                                               Petra Kuba, M.D.    MEM/MEDQ  D:  10/24/2001  T:  10/26/2001  Job:  [id number]   cc:   Gaynelle Cage

## 2010-08-11 ENCOUNTER — Encounter: Payer: Self-pay | Admitting: Nurse Practitioner

## 2010-11-26 LAB — URINALYSIS, ROUTINE W REFLEX MICROSCOPIC
Nitrite: NEGATIVE
Protein, ur: NEGATIVE
Specific Gravity, Urine: 1.012

## 2010-11-26 LAB — LIPID PANEL
Cholesterol: 119
HDL: 23 — ABNORMAL LOW
LDL Cholesterol: 85
Total CHOL/HDL Ratio: 5.2
Triglycerides: 53

## 2011-01-13 ENCOUNTER — Ambulatory Visit (HOSPITAL_COMMUNITY)
Admission: RE | Admit: 2011-01-13 | Discharge: 2011-01-13 | Disposition: A | Discharge status: Home / Self Care | Payer: Medicare Other | Source: Ambulatory Visit | Attending: Family Medicine | Admitting: Family Medicine

## 2011-01-13 DIAGNOSIS — H34219 Partial retinal artery occlusion, unspecified eye: Secondary | ICD-10-CM

## 2011-01-13 DIAGNOSIS — I2510 Atherosclerotic heart disease of native coronary artery without angina pectoris: Secondary | ICD-10-CM

## 2011-01-13 DIAGNOSIS — R0989 Other specified symptoms and signs involving the circulatory and respiratory systems: Secondary | ICD-10-CM

## 2011-01-13 NOTE — Progress Notes (Signed)
*  PRELIMINARY RESULTS*  Carotid Duplex  has been performed.  Bilateral greater than 80 % ICA.  Dr. Birdena Jubilee notified.  Patient to call office this afternoon for further instructions.  Vanna Scotland 01/13/2011, 7:31 PM

## 2011-01-15 ENCOUNTER — Other Ambulatory Visit: Payer: Self-pay

## 2011-01-15 DIAGNOSIS — I6529 Occlusion and stenosis of unspecified carotid artery: Secondary | ICD-10-CM

## 2011-01-16 ENCOUNTER — Encounter: Payer: Self-pay | Admitting: Vascular Surgery

## 2011-01-26 ENCOUNTER — Other Ambulatory Visit (INDEPENDENT_AMBULATORY_CARE_PROVIDER_SITE_OTHER): Payer: Medicare Other | Admitting: *Deleted

## 2011-01-26 DIAGNOSIS — I6529 Occlusion and stenosis of unspecified carotid artery: Secondary | ICD-10-CM

## 2011-01-29 NOTE — Procedures (Unsigned)
CAROTID DUPLEX EXAM  INDICATION:  80% stenoses.  HISTORY: Diabetes:  Yes. Cardiac:  CABG. Hypertension:  Yes. Smoking:  No. Previous Surgery:  No. CV History:  Occasional positional dizziness. Amaurosis Fugax No, Paresthesias No, Hemiparesis No.                                      RIGHT               LEFT Brachial systolic pressure:         150                 148 Brachial Doppler waveforms:         Normal              Normal Vertebral direction of flow:        Antegrade           Antegrade DUPLEX VELOCITIES (cm/sec) CCA peak systolic                   67                  49 ECA peak systolic                   184                 25 ICA peak systolic                   607                 598 ICA end diastolic                   252                 287 PLAQUE MORPHOLOGY:                  Mixed               Mixed PLAQUE AMOUNT:                      Severe              Severe PLAQUE LOCATION:                    ICA/ECA/bifurcation ICA/ECA/bifurcation  IMPRESSION: 1. Doppler velocities suggest 80% to 99% stenoses of the bilateral     proximal internal carotid arteries. 2. No significant change in the percentage of stenoses of the     bilateral internal carotid arteries noted when compared to the     previous examination on 01/13/2011 at [facility]. 3. Vascular consult appointment is scheduled for 02/11/2011.     ___________________________________________ Di Kindle. Edilia Bo, M.D.  CH/MEDQ  D:  01/27/2011  T:  01/27/2011  Job:  [id number]

## 2011-02-10 ENCOUNTER — Encounter: Payer: Self-pay | Admitting: Vascular Surgery

## 2011-02-11 ENCOUNTER — Encounter: Payer: Self-pay | Admitting: Vascular Surgery

## 2011-02-11 ENCOUNTER — Other Ambulatory Visit: Payer: Self-pay

## 2011-02-11 ENCOUNTER — Encounter (HOSPITAL_COMMUNITY): Payer: Self-pay | Admitting: Pharmacy Technician

## 2011-02-11 ENCOUNTER — Ambulatory Visit (INDEPENDENT_AMBULATORY_CARE_PROVIDER_SITE_OTHER): Payer: Medicare Other | Admitting: Vascular Surgery

## 2011-02-11 VITALS — BP 155/63 | HR 69 | Resp 16 | Ht 61.0 in | Wt 196.0 lb

## 2011-02-11 DIAGNOSIS — I6529 Occlusion and stenosis of unspecified carotid artery: Secondary | ICD-10-CM | POA: Insufficient documentation

## 2011-02-11 NOTE — Progress Notes (Signed)
Vascular and Vein Specialist of [facility]  Patient name: [name] MRN: [id number] DOB: [birth year] Sex: male  REASON FOR CONSULT: Bilateral severe carotid artery stenoses. Referred by Dr. Paulita Cradle.  HPI: [name] is a 77 y.o. male who I had last seen in December of 2009. He had a right hemispheric stroke associated with some left arm and left lower tree weakness. His carotid duplex scan back in showed bilateral 40-59% carotid stenoses. I planned for him to continue with routine follow carotid duplex scans and I believe this is been done at the [facility] office. I have not seen him in 3 years. He had a carotid duplex scan done in our office on 01/26/2011 which shows that he has severe bilateral carotid stenoses. He was sent for vascular consultation.  The patient has had no subsequent history of stroke since the event in 2009. He's had no expressive or receptive aphasia or amaurosis fugax. His had no history of TIAs. He does have a history of type 2 diabetes which is stable on his current medications. In addition his hypertension has been stable on his current medications.  Past Medical History  Diagnosis Date  . Diabetes mellitus type 2   . CAD (coronary artery disease) of artery bypass graft   . GERD (gastroesophageal reflux disease)   . Hyperlipidemia   . CVA (cerebral vascular accident)     left   . DJD (degenerative joint disease)   . Urosepsis   . Anemia   . Hypertension   . Peripheral vascular disease     Family History  Problem Relation Age of Onset  . Diabetes Sister   . Cancer Brother   . Diabetes Brother     SOCIAL HISTORY: History  Substance Use Topics  . Smoking status: Former Smoker    Types: Cigarettes  . Smokeless tobacco: Not on file   Comment: quit in 1976  . Alcohol Use: Yes     quit in 1981    Allergies  Allergen Reactions  . Afrin Nasal Spray   . Allegra   . Flonase (Fluticasone Propionate)     Current Outpatient  Prescriptions  Medication Sig Dispense Refill  . clopidogrel (PLAVIX) 75 MG tablet Take 75 mg by mouth daily.        . fish oil-omega-3 fatty acids 1000 MG capsule Take 2 g by mouth daily.        . Flaxseed, Linseed, (FLAX SEED OIL PO) Take by mouth.        . glyBURIDE (DIABETA) 2.5 MG tablet Take 2.5 mg by mouth 2 (two) times daily with a meal.        . meloxicam (MOBIC) 15 MG tablet Take 15 mg by mouth daily.        . metoprolol (LOPRESSOR) 50 MG tablet Take 50 mg by mouth 2 (two) times daily. Take half tablet by mouth two times a day         . Multiple Vitamin (MULTIVITAMIN) tablet Take 1 tablet by mouth daily.        . niacin (NIASPAN) 1000 MG CR tablet Take 1,000 mg by mouth at bedtime.        . ramipril (ALTACE) 2.5 MG tablet Take 2.5 mg by mouth daily.        . simvastatin (ZOCOR) 20 MG tablet Take 20 mg by mouth at bedtime.          REVIEW OF SYSTEMS: Arly.Keller ] denotes positive finding; [  ] denotes  negative finding CARDIOVASCULAR:  [ ]  chest pain   [ ]  chest pressure   [ ]  palpitations   [ ]  orthopnea   [ ]  dyspnea on exertion   Arly.Keller ] claudication   [ ]  rest pain   [ ]  DVT   [ ]  phlebitis PULMONARY:   [ ]  productive cough   [ ]  asthma   [ ]  wheezing NEUROLOGIC:   [ ]  weakness  [ ]  paresthesias  [ ]  aphasia  [ ]  amaurosis  Arly.Keller ] dizziness- when standing rapidly HEMATOLOGIC:   [ ]  bleeding problems   [ ]  clotting disorders MUSCULOSKELETAL:  [ ]  joint pain   [ ]  joint swelling [ ]  leg swelling GASTROINTESTINAL: [ ]   blood in stool  [ ]   hematemesis GENITOURINARY:  [ ]   dysuria  [ ]   hematuria PSYCHIATRIC:  [ ]  history of major depression INTEGUMENTARY:  [ ]  rashes  [ ]  ulcers CONSTITUTIONAL:  [ ]  fever   [ ]  chills  PHYSICAL EXAM: Filed Vitals:   02/11/11 0930 02/11/11 0932  BP: 175/74 155/63  Pulse: 68 69  Resp: 16   Height: 5\' 1"  (1.549 m)   Weight: 196 lb (88.905 kg)   SpO2: 98% 98%   Body mass index is 37.03 kg/(m^2). GENERAL: The patient is a well-nourished male, in no  acute distress. The vital signs are documented above. CARDIOVASCULAR: There is a regular rate and rhythm without significant murmur appreciated. He has bilateral carotid bruits. He has palpable femoral pulses and warm well-perfused feet. PULMONARY: There is good air exchange bilaterally without wheezing or rales. ABDOMEN: Soft and non-tender with normal pitched bowel sounds.  MUSCULOSKELETAL: There are no major deformities or cyanosis. NEUROLOGIC: No focal weakness or paresthesias are detected. SKIN: There are no ulcers or rashes noted. PSYCHIATRIC: The patient has a normal affect.  DATA:  1. I have independently interpreted his carotid duplex scan done 01/26/2011. This shows severe bilateral carotid stenoses. The end diastolic velocity on the right is 252 cm/s. The end diastolic velocity on the left is 287 cm/s. Both vertebral arteries are patent with normally directed flow.  MEDICAL ISSUES:  Given that the carotid stenoses have progressed to greater than 80%, I have recommended staged bilateral carotid endarterectomies. His left carotid endarterectomy is scheduled for 02/17/2011. I would plan on proceeding with right carotid endarterectomy in approximately 4 weeks postoperatively. Have instructed him to discontinue his Plavix preoperatively and did begin taking aspirin 81 mg a day.  I have reviewed the indications for carotid endarterectomy, that is to lower the risk of future stroke. I have also reviewed the potential complications of surgery, including but not limited to: bleeding, stroke (perioperative risk 1-2%), MI, nerve injury of other unpredictable medical problems. All of the patients questions were answered and they are agreeable to proceed with surgery.   Arye Weyenberg S [facility]: [phone]

## 2011-02-13 ENCOUNTER — Encounter (HOSPITAL_COMMUNITY)
Admission: RE | Admit: 2011-02-13 | Discharge: 2011-02-13 | Disposition: A | Discharge status: Home / Self Care | Payer: Medicare Other | Source: Ambulatory Visit | Attending: Anesthesiology | Admitting: Anesthesiology

## 2011-02-13 ENCOUNTER — Other Ambulatory Visit: Payer: Self-pay

## 2011-02-13 ENCOUNTER — Encounter (HOSPITAL_COMMUNITY): Payer: Self-pay

## 2011-02-13 ENCOUNTER — Encounter (HOSPITAL_COMMUNITY)
Admission: RE | Admit: 2011-02-13 | Discharge: 2011-02-13 | Disposition: A | Discharge status: Home / Self Care | Payer: Medicare Other | Source: Ambulatory Visit | Attending: Vascular Surgery | Admitting: Vascular Surgery

## 2011-02-13 DIAGNOSIS — Z01810 Encounter for preprocedural cardiovascular examination: Secondary | ICD-10-CM | POA: Insufficient documentation

## 2011-02-13 DIAGNOSIS — Z538 Procedure and treatment not carried out for other reasons: Secondary | ICD-10-CM | POA: Insufficient documentation

## 2011-02-13 DIAGNOSIS — Z01812 Encounter for preprocedural laboratory examination: Secondary | ICD-10-CM | POA: Insufficient documentation

## 2011-02-13 DIAGNOSIS — Z01818 Encounter for other preprocedural examination: Secondary | ICD-10-CM | POA: Insufficient documentation

## 2011-02-13 LAB — URINALYSIS, ROUTINE W REFLEX MICROSCOPIC
Ketones, ur: NEGATIVE mg/dL
Nitrite: NEGATIVE
Protein, ur: NEGATIVE mg/dL
pH: 7 (ref 5.0–8.0)

## 2011-02-13 LAB — CBC
MCH: 32.1 pg (ref 26.0–34.0)
MCHC: 35 g/dL (ref 30.0–36.0)
MCV: 91.6 fL (ref 78.0–100.0)
Platelets: 180 10*3/uL (ref 150–400)
RDW: 14.2 % (ref 11.5–15.5)

## 2011-02-13 LAB — DIFFERENTIAL
Basophils Absolute: 0 10*3/uL (ref 0.0–0.1)
Lymphocytes Relative: 35 % (ref 12–46)
Monocytes Absolute: 0.4 10*3/uL (ref 0.1–1.0)
Neutro Abs: 2.5 10*3/uL (ref 1.7–7.7)

## 2011-02-13 LAB — COMPREHENSIVE METABOLIC PANEL
AST: 16 U/L (ref 0–37)
CO2: 29 mEq/L (ref 19–32)
Calcium: 9.3 mg/dL (ref 8.4–10.5)
Creatinine, Ser: 0.95 mg/dL (ref 0.50–1.35)
GFR calc non Af Amer: 78 mL/min — ABNORMAL LOW (ref >90)
Total Protein: 7.5 g/dL (ref 6.0–8.3)

## 2011-02-13 LAB — TYPE AND SCREEN: ABO/RH(D): A NEG

## 2011-02-13 LAB — URINE MICROSCOPIC-ADD ON

## 2011-02-13 NOTE — Progress Notes (Signed)
Last visit to cardiac 08 note in epic .

## 2011-02-13 NOTE — Pre-Procedure Instructions (Signed)
[address]ang  02/13/2011   Your procedure is scheduled on:  02/17/11  Report to [facility] at 730 AM.  Call this number if you have problems the morning of surgery: [phone]   Remember:   Do not eat food:After Midnight.  May have clear liquids: up to 4 Hours before arrival.  Clear liquids include soda, tea, black coffee, apple or grape juice, broth.  Take these medicines the morning of surgery with A SIP OF WATER: metoprolol, asa    STOP PLAVIX, FISH OIL, FLAX SEED , MOBIC  NOW IF NOT ALREADY STOPPED   Do not wear jewelry, make-up or nail polish.  Do not wear lotions, powders, or perfumes. You may wear deodorant.  Do not shave 48 hours prior to surgery.  Do not bring valuables to the hospital.  Contacts, dentures or bridgework may not be worn into surgery.  Leave suitcase in the car. After surgery it may be brought to your room.  For patients admitted to the hospital, checkout time is 11:00 AM the day of discharge.   Patients discharged the day of surgery will not be allowed to drive home.  Name and phone number of your driver: Darel Hong [phone]  Special Instructions: CHG Shower Use Special Wash: 1/2 bottle night before surgery and 1/2 bottle morning of surgery.   Please read over the following fact sheets that you were given: Pain Booklet, Coughing and Deep Breathing, Blood Transfusion Information, MRSA Information and Surgical Site Infection Prevention

## 2011-02-16 ENCOUNTER — Encounter (HOSPITAL_COMMUNITY): Payer: Self-pay | Admitting: Vascular Surgery

## 2011-02-16 ENCOUNTER — Telehealth: Payer: Self-pay | Admitting: *Deleted

## 2011-02-16 NOTE — Consult Note (Signed)
Anesthesia:  Patient is a 77 year old male scheduled for a left CEA on 02/17/11 with a right CEA at a later date.  His history includes DM2, CAD s/p CABG '88 (LIMa to LAD, SVG to PDA, SVG to DIAG, SVG to PL), GERD, HLD, right brain CVA '09, anemia, HTN, and PVD.  He is a former smoker.  He was last seen by Dr. Antoine Poche on 02/08/08.  His PCP office then was [facility].  His preoperative EKG shows SB and incomplete left BBB by the interpreting Cardiologist.  I thought these were subtle changes from his last EKG in 2009.  His last echo was on 07/15/07 showing normal LV systolic function, EF 65-75%.  Carotid duplex on 01/26/11 showed 80-99% bilateral ICA stenosis.  By notes, he has not had a recurrent CVA since 2009.  Preoperative labs are acceptable from an Anesthesia standpoint, but UA shows large leukocytes, many bacteria, too many WBCs to count, but negative nitrites.  His CXR showed no acute process.  Since he has a history of CAD/CABG without follow-up in three years, he would probably benefit from Cardiology evaluation preoperatively.  I reviewed above with Dr. Chaney Malling who agrees.  I notified Oswaldo Done RN at [facility] of UA results and that Anesthesia is requesting Cardiology input preoperatively.  She will review with Dr. Edilia Bo and will contact me or Dr. Chaney Malling if any questions or concerns.

## 2011-02-16 NOTE — Telephone Encounter (Signed)
Left CEA for 02/17/11 cancelled due to anesthesia needing [place] to obtain Cardiac clearance prior to surgery

## 2011-02-17 ENCOUNTER — Encounter (HOSPITAL_COMMUNITY): Admission: RE | Payer: Self-pay | Source: Ambulatory Visit

## 2011-02-17 ENCOUNTER — Ambulatory Visit (HOSPITAL_COMMUNITY): Admission: RE | Admit: 2011-02-17 | Payer: Medicare Other | Source: Ambulatory Visit | Admitting: Vascular Surgery

## 2011-02-17 SURGERY — ENDARTERECTOMY, CAROTID
Anesthesia: General | Site: Neck | Laterality: Left

## 2011-03-03 DIAGNOSIS — I6529 Occlusion and stenosis of unspecified carotid artery: Secondary | ICD-10-CM

## 2011-03-03 HISTORY — DX: Occlusion and stenosis of unspecified carotid artery: I65.29

## 2011-03-17 ENCOUNTER — Ambulatory Visit (INDEPENDENT_AMBULATORY_CARE_PROVIDER_SITE_OTHER): Payer: Medicare Other | Admitting: Cardiology

## 2011-03-17 ENCOUNTER — Encounter: Payer: Self-pay | Admitting: Cardiology

## 2011-03-17 DIAGNOSIS — I10 Essential (primary) hypertension: Secondary | ICD-10-CM | POA: Insufficient documentation

## 2011-03-17 DIAGNOSIS — E119 Type 2 diabetes mellitus without complications: Secondary | ICD-10-CM | POA: Insufficient documentation

## 2011-03-17 DIAGNOSIS — E785 Hyperlipidemia, unspecified: Secondary | ICD-10-CM

## 2011-03-17 DIAGNOSIS — I739 Peripheral vascular disease, unspecified: Secondary | ICD-10-CM | POA: Insufficient documentation

## 2011-03-17 DIAGNOSIS — I25810 Atherosclerosis of coronary artery bypass graft(s) without angina pectoris: Secondary | ICD-10-CM

## 2011-03-17 DIAGNOSIS — Z01810 Encounter for preprocedural cardiovascular examination: Secondary | ICD-10-CM | POA: Insufficient documentation

## 2011-03-17 NOTE — Patient Instructions (Signed)
Your physician wants you to follow-up in: ONE YEAR You will receive a reminder letter in the mail two months in advance. If you don't receive a letter, please call our office to schedule the follow-up appointment.   Your physician has requested that you have en exercise stress myoview. For further information please visit [email or web address]. Please follow instruction sheet, as given.   

## 2011-03-17 NOTE — Assessment & Plan Note (Signed)
His blood pressure is well controlled. He will continue the meds as listed. 

## 2011-03-17 NOTE — Assessment & Plan Note (Signed)
The patient has coronary disease with distant bypass grafting. His last stress perfusion study was several years ago. Given this screening stress perfusion study is indicated prior to elective surgery. This is according to ACC/AHA guidelines. Preoperative clearance will be pending this.

## 2011-03-17 NOTE — Assessment & Plan Note (Signed)
I will defer to his primary provider with a goal LDL less than 70 and HDL greater than 50.

## 2011-03-17 NOTE — Progress Notes (Signed)
HPI The patient presents for evaluation prior to having endarterectomy. I saw him several years ago for his known coronary and peripheral vascular disease. Since I last saw him he has had no acute cardiovascular complaints. He does not recall having any symptoms in the past with his bypass. Nowadays he does his activities of daily living and walks several times a day a long distance to his mailbox. With this he denies any chest pressure, neck or arm discomfort. He doesn't have any shortness of breath, PND or orthopnea. He does not have palpitations, presyncope or syncope. He has no weight gain or edema. He reports that he is having his cholesterol and blood pressure followed routinely.  Allergies  Allergen Reactions  . Afrin Nasal Spray Other (See Comments)    "goes berserk"   . Allegra Other (See Comments)    "loses it"  . Flonase (Fluticasone Propionate) Other (See Comments)    "goes berserk"    Current Outpatient Prescriptions  Medication Sig Dispense Refill  . aspirin EC 325 MG tablet Take 325 mg by mouth daily. Started 02/11/11 when plavix was stopped for procedure       . clopidogrel (PLAVIX) 75 MG tablet Take 75 mg by mouth daily. Stopped for procedure on 02/11/11...replaced with aspirin      . fish oil-omega-3 fatty acids 1000 MG capsule Take 2 g by mouth daily.       . Flaxseed, Linseed, (FLAX SEED OIL PO) Take 1,300 mg by mouth daily.       Marland Kitchen glyBURIDE (DIABETA) 2.5 MG tablet Take 2.5 mg by mouth 2 (two) times daily with a meal.       . meloxicam (MOBIC) 15 MG tablet Take 15 mg by mouth daily.       . metoprolol (LOPRESSOR) 50 MG tablet Take 50 mg by mouth 2 (two) times daily. Take half tablet by mouth two times a day        . Multiple Vitamin (MULTIVITAMIN) tablet Take 1 tablet by mouth daily.       . niacin (NIASPAN) 1000 MG CR tablet Take 1,000 mg by mouth at bedtime.       . ramipril (ALTACE) 2.5 MG tablet Take 2.5 mg by mouth daily.       . simvastatin (ZOCOR) 20 MG  tablet Take 20 mg by mouth at bedtime.       . Tamsulosin HCl (FLOMAX) 0.4 MG CAPS Take 0.4 mg by mouth daily.        Past Medical History  Diagnosis Date  . Diabetes mellitus type 2   . CAD (coronary artery disease) of artery bypass graft   . GERD (gastroesophageal reflux disease)   . Hyperlipidemia   . CVA (cerebral vascular accident)     left   . DJD (degenerative joint disease)   . Urosepsis   . Anemia   . Hypertension   . Peripheral vascular disease     Past Surgical History  Procedure Date  . Appendectomy   . Right ankle   . Coronary artery bypass graft 1998  . Eye surgery     iol left, cat rem rt    Family History  Problem Relation Age of Onset  . Diabetes Sister   . Cancer Brother   . Diabetes Brother     History   Social History  . Marital Status: Married    Spouse Name: N/A    Number of Children: N/A  . Years of Education: N/A  Occupational History  . Not on file.   Social History Main Topics  . Smoking status: Former Smoker    Types: Cigarettes  . Smokeless tobacco: Not on file   Comment: quit in 1976  . Alcohol Use: Yes     quit in 1981  . Drug Use: No  . Sexually Active: Not on file   Other Topics Concern  . Not on file   Social History Narrative  . No narrative on file    ROS:  Occasional right leg swelling, mild orthostatic dizziness, occasional cough.  Otherwise as stated in the HPI and negative for all other systems.   PHYSICAL EXAM BP 154/68  Pulse 71  Resp 16  Ht 5\' 11"  (1.803 m)  Wt 199 lb (90.266 kg)  BMI 27.75 kg/m2 GENERAL:  Well appearing HEENT:  Pupils equal round and reactive, fundi not visualized, oral mucosa unremarkable NECK:  No jugular venous distention, waveform within normal limits, carotid upstroke brisk and symmetric, bilatral bruits, no thyromegaly LYMPHATICS:  No cervical, inguinal adenopathy LUNGS:  Clear to auscultation bilaterally BACK:  No CVA tenderness CHEST:  Well healed sternotomy  scar. HEART:  PMI not displaced or sustained,S1 and S2 within normal limits, no S3, no S4, no clicks, no rubs, no murmurs ABD:  Flat, positive bowel sounds normal in frequency in pitch, no bruits, no rebound, no guarding, no midline pulsatile mass, no hepatomegaly, no splenomegaly EXT:  2 plus pulses upper, absent DP/PT right and decreased on the left, no edema, no cyanosis no clubbing SKIN:  No rashes no nodules NEURO:  Cranial nerves II through XII grossly intact, motor grossly intact throughout PSYCH:  Cognitively intact, oriented to person place and time  EKG:  Eyes rhythm, rate 57, axis within normal limits, intervals within normal limits, no acute ST-T wave changes. Feb 12, 2010.  ASSESSMENT AND PLAN

## 2011-03-19 ENCOUNTER — Ambulatory Visit (HOSPITAL_COMMUNITY): Payer: Medicare Other | Attending: Cardiology | Admitting: Radiology

## 2011-03-19 DIAGNOSIS — I739 Peripheral vascular disease, unspecified: Secondary | ICD-10-CM

## 2011-03-19 DIAGNOSIS — E785 Hyperlipidemia, unspecified: Secondary | ICD-10-CM

## 2011-03-19 DIAGNOSIS — I2510 Atherosclerotic heart disease of native coronary artery without angina pectoris: Secondary | ICD-10-CM

## 2011-03-19 DIAGNOSIS — E119 Type 2 diabetes mellitus without complications: Secondary | ICD-10-CM | POA: Insufficient documentation

## 2011-03-19 DIAGNOSIS — Z01810 Encounter for preprocedural cardiovascular examination: Secondary | ICD-10-CM

## 2011-03-19 DIAGNOSIS — I779 Disorder of arteries and arterioles, unspecified: Secondary | ICD-10-CM | POA: Insufficient documentation

## 2011-03-19 DIAGNOSIS — Z8679 Personal history of other diseases of the circulatory system: Secondary | ICD-10-CM | POA: Insufficient documentation

## 2011-03-19 DIAGNOSIS — I10 Essential (primary) hypertension: Secondary | ICD-10-CM

## 2011-03-19 DIAGNOSIS — I25810 Atherosclerosis of coronary artery bypass graft(s) without angina pectoris: Secondary | ICD-10-CM

## 2011-03-19 DIAGNOSIS — I6529 Occlusion and stenosis of unspecified carotid artery: Secondary | ICD-10-CM

## 2011-03-19 DIAGNOSIS — Z87891 Personal history of nicotine dependence: Secondary | ICD-10-CM | POA: Insufficient documentation

## 2011-03-19 DIAGNOSIS — Z951 Presence of aortocoronary bypass graft: Secondary | ICD-10-CM | POA: Insufficient documentation

## 2011-03-19 MED ORDER — TECHNETIUM TC 99M TETROFOSMIN IV KIT
33.0000 | PACK | Freq: Once | INTRAVENOUS | Status: AC | PRN
  Administered 2011-03-19: 33 via INTRAVENOUS

## 2011-03-19 MED ORDER — TECHNETIUM TC 99M TETROFOSMIN IV KIT
10.0000 | PACK | Freq: Once | INTRAVENOUS | Status: AC | PRN
  Administered 2011-03-19: 10 via INTRAVENOUS

## 2011-03-19 MED ORDER — REGADENOSON 0.4 MG/5ML IV SOLN
0.4000 mg | Freq: Once | INTRAVENOUS | Status: AC
  Administered 2011-03-19: 0.4 mg via INTRAVENOUS

## 2011-03-19 NOTE — Progress Notes (Signed)
[facility] SITE 3 NUCLEAR MED [address] [place] [address] [phone]  Cardiology Nuclear Med Study  [name] is a 78 y.o. male [id number] 1931-05-29   Nuclear Med Background Indication for Stress Test:  Evaluation for Ischemia, Graft Patency and Surgical Clearance Pending (B) Endarterectomy by Dr. Waverly Ferrari History:  '98 CABG; '07 MPS:No ischemia, EF=53%; '09 Echo:EF=65-75% Cardiac Risk Factors: Carotid Disease, CVA, History of Smoking, Hypertension, Lipids, NIDDM and PVD  Symptoms:  No cardiac symptoms.   Nuclear Pre-Procedure Caffeine/Decaff Intake:  None NPO After: 6:00pm   Lungs:  Slight expiratory wheeze, cleared with cough.  O2 SAT 97% on RA. IV 0.9% NS with Angio Cath:  22g  IV Site: R Antecubital x 1, tolerated well IV Started by:  Irean Hong, RN  Chest Size (in):  44 Cup Size: n/a  Height: 5\' 11"  (1.803 m)  Weight:  190 lb (86.183 kg)  BMI:  Body mass index is 26.50 kg/(m^2). Tech Comments: Patient held Metoprolol and Diabeta x 24 hours.  FBS at 4:30 am today was 90, per patient.    Nuclear Med Study 1 or 2 day study: 1 day  Stress Test Type:  Eugenie Birks  Reading MD: Olga Millers, MD  Order Authorizing Provider:  Rollene Rotunda, MD  Resting Radionuclide: Technetium 65m Tetrofosmin  Resting Radionuclide Dose: 10.0 mCi   Stress Radionuclide:  Technetium 52m Tetrofosmin  Stress Radionuclide Dose: 33.0 mCi           Stress Protocol Rest HR: 75 Stress HR: 105  Rest BP: 143/71 Stress BP: 145/75  Exercise Time (min): n/a METS: n/a   Predicted Max HR: 143 bpm % Max HR: 73.43 bpm Rate Pressure Product: 57846   Dose of Adenosine (mg):  n/a Dose of Lexiscan: 0.4 mg  Dose of Atropine (mg): n/a Dose of Dobutamine: n/a mcg/kg/min (at max HR)  Stress Test Technologist: Smiley Houseman, CMA-N  Nuclear Technologist:  Domenic Polite, CNMT     Rest Procedure:  Myocardial perfusion imaging was performed at rest 45 minutes  following the intravenous administration of Technetium 37m Tetrofosmin.  Rest ECG: No Acute changes.  Stress Procedure:  The patient received IV Lexiscan 0.4 mg over 15-seconds.  Technetium 28m Tetrofosmin injected at 30-seconds.  There were nonspecific T-wave changes and occasional PVC's with Lexiscan.  Quantitative spect images were obtained after a 45 minute delay.  Stress ECG: No significant ST segment change suggestive of ischemia.  QPS Raw Data Images:  Acquisition technically good; normal left ventricular size. Stress Images:  There is decreased uptake in the inferoseptal wall. Rest Images:  There is decreased uptake in the inferoseptal wall. Subtraction (SDS):  No evidence of ischemia. Transient Ischemic Dilatation (Normal <1.22):  1.13 Lung/Heart Ratio (Normal <0.45):  0.29  Quantitative Gated Spect Images QGS EDV:  113 ml QGS ESV:  61 ml QGS cine images:  LV function appears better than calculated EF; suggest echocardiogram to further assess if clinically indicated. QGS EF: 46%  Impression Exercise Capacity:  Lexiscan with no exercise. BP Response:  Normal blood pressure response. Clinical Symptoms:  No chest pain. ECG Impression:  No diagnostic ST changes. Comparison with Prior Nuclear Study: No significant change from previous study  Overall Impression:  Probably normal stress nuclear study with a small fixed inferoseptal defect suggestive of thinning; no ischemia.   Olga Millers

## 2011-03-30 ENCOUNTER — Telehealth: Payer: Self-pay | Admitting: Cardiology

## 2011-03-30 NOTE — Telephone Encounter (Signed)
New problem Pt wants when his surgery is to be scheduled. He hasnt heard anything please call him back. He will be home tomorrow

## 2011-03-30 NOTE — Telephone Encounter (Signed)
Will forward to Dr. Jenene Slicker nurse for review.

## 2011-03-31 NOTE — Telephone Encounter (Signed)
The patient had a low risk study.  Therfore, based on ACC/AHA guidelines, the patient would be at acceptable risk for the planned procedure without further cardiovascular testing.

## 2011-03-31 NOTE — Telephone Encounter (Signed)
Reviewed results of stress testing again with pt.  He is aware to call Dr Adele Dan office to schedule his surgery.

## 2011-04-01 ENCOUNTER — Telehealth: Payer: Self-pay | Admitting: *Deleted

## 2011-04-01 ENCOUNTER — Other Ambulatory Visit: Payer: Self-pay | Admitting: *Deleted

## 2011-04-01 ENCOUNTER — Encounter (HOSPITAL_COMMUNITY): Payer: Self-pay | Admitting: Pharmacy Technician

## 2011-04-01 NOTE — Telephone Encounter (Signed)
[name] [name] called to reschedule his Left CEA surgery with Dr Edilia Bo. I spoke with Dr Edilia Bo. We scheduled the surgery for 04/09/11. I went over directions with [name] and he verbalized understanding.

## 2011-04-06 ENCOUNTER — Other Ambulatory Visit (HOSPITAL_COMMUNITY): Payer: Self-pay | Admitting: *Deleted

## 2011-04-06 NOTE — Pre-Procedure Instructions (Signed)
20 Karthikeya Funke Strubel  04/06/2011   Your procedure is scheduled on:  Thurs, Feb 7 @ 0730  Report to [facility] at 0530 AM.  Call this number if you have problems the morning of surgery: [phone]   Remember:   Do not eat food:After Midnight.  May have clear liquids: up to 4 Hours before arrival.(until 1:30 am)  Clear liquids include soda, tea, black coffee, apple or grape juice, broth.  Take these medicines the morning of surgery with A SIP OF WATER: Metoprolol and Flomax   Do not wear jewelry, make-up or nail polish.  Do not wear lotions, powders, or perfumes. You may wear deodorant.  Do not shave 48 hours prior to surgery.  Do not bring valuables to the hospital.  Contacts, dentures or bridgework may not be worn into surgery.  Leave suitcase in the car. After surgery it may be brought to your room.  For patients admitted to the hospital, checkout time is 11:00 AM the day of discharge.   Patients discharged the day of surgery will not be allowed to drive home.  Name and phone number of your driver:   Special Instructions: CHG Shower Use Special Wash: 1/2 bottle night before surgery and 1/2 bottle morning of surgery.   Please read over the following fact sheets that you were given: Pain Booklet, Coughing and Deep Breathing, Blood Transfusion Information, MRSA Information and Surgical Site Infection Prevention

## 2011-04-07 ENCOUNTER — Encounter (HOSPITAL_COMMUNITY): Payer: Self-pay

## 2011-04-07 ENCOUNTER — Encounter (HOSPITAL_COMMUNITY)
Admission: RE | Admit: 2011-04-07 | Discharge: 2011-04-07 | Disposition: A | Discharge status: Home / Self Care | Payer: Medicare Other | Source: Ambulatory Visit | Attending: Vascular Surgery | Admitting: Vascular Surgery

## 2011-04-07 HISTORY — DX: Pneumonia, unspecified organism: J18.9

## 2011-04-07 HISTORY — DX: Unspecified hearing loss, unspecified ear: H91.90

## 2011-04-07 HISTORY — DX: Spontaneous ecchymoses: R23.3

## 2011-04-07 HISTORY — DX: Shortness of breath: R06.02

## 2011-04-07 HISTORY — DX: Dizziness and giddiness: R42

## 2011-04-07 HISTORY — DX: Reserved for inherently not codable concepts without codable children: IMO0001

## 2011-04-07 HISTORY — DX: Other skin changes: R23.8

## 2011-04-07 HISTORY — DX: Urinary tract infection, site not specified: N39.0

## 2011-04-07 HISTORY — DX: Frequency of micturition: R35.0

## 2011-04-07 LAB — URINALYSIS, ROUTINE W REFLEX MICROSCOPIC
Glucose, UA: NEGATIVE mg/dL
Protein, ur: NEGATIVE mg/dL
Urobilinogen, UA: 0.2 mg/dL (ref 0.0–1.0)

## 2011-04-07 LAB — URINE MICROSCOPIC-ADD ON

## 2011-04-07 LAB — COMPREHENSIVE METABOLIC PANEL
ALT: 14 U/L (ref 0–53)
Albumin: 3.6 g/dL (ref 3.5–5.2)
Calcium: 9 mg/dL (ref 8.4–10.5)
GFR calc Af Amer: 81 mL/min — ABNORMAL LOW (ref >90)
Glucose, Bld: 162 mg/dL — ABNORMAL HIGH (ref 70–99)
Sodium: 136 mEq/L (ref 135–145)
Total Protein: 7.6 g/dL (ref 6.0–8.3)

## 2011-04-07 LAB — DIFFERENTIAL
Eosinophils Relative: 3 % (ref 0–5)
Lymphocytes Relative: 23 % (ref 12–46)
Lymphs Abs: 1.7 10*3/uL (ref 0.7–4.0)

## 2011-04-07 LAB — CBC
MCH: 31.8 pg (ref 26.0–34.0)
MCHC: 34 g/dL (ref 30.0–36.0)
Platelets: 196 10*3/uL (ref 150–400)
RDW: 14 % (ref 11.5–15.5)

## 2011-04-07 LAB — TYPE AND SCREEN
ABO/RH(D): A NEG
Antibody Screen: NEGATIVE

## 2011-04-07 LAB — PROTIME-INR
INR: 0.98 (ref 0.00–1.49)
Prothrombin Time: 13.2 seconds (ref 11.6–15.2)

## 2011-04-07 MED ORDER — DEXTROSE 5 % IV SOLN: 1.5000 g | INTRAVENOUS | Status: DC

## 2011-04-07 NOTE — Progress Notes (Signed)
Dr.Hochriene is cardiologist-last office visit in epic from 03/17/11 and 03/30/11  Stress test done 03/23/11 in epic  Echo done 07/15/07  Heart cath in 1998

## 2011-04-08 MED ORDER — SODIUM CHLORIDE 0.9 % IV SOLN: INTRAVENOUS | Status: DC

## 2011-04-08 MED ORDER — DEXTROSE 5 % IV SOLN
1.5000 g | INTRAVENOUS | Status: DC
  Filled 2011-04-08: qty 1.5

## 2011-04-09 ENCOUNTER — Other Ambulatory Visit: Payer: Self-pay | Admitting: *Deleted

## 2011-04-09 ENCOUNTER — Encounter (HOSPITAL_COMMUNITY): Payer: Self-pay | Admitting: Vascular Surgery

## 2011-04-09 ENCOUNTER — Other Ambulatory Visit: Payer: Self-pay | Admitting: Vascular Surgery

## 2011-04-09 ENCOUNTER — Ambulatory Visit (HOSPITAL_COMMUNITY): Payer: Medicare Other | Admitting: Vascular Surgery

## 2011-04-09 ENCOUNTER — Encounter (HOSPITAL_COMMUNITY)
Admission: RE | Disposition: A | Discharge status: Home / Self Care | Payer: Self-pay | Source: Ambulatory Visit | Attending: Vascular Surgery

## 2011-04-09 ENCOUNTER — Encounter (HOSPITAL_COMMUNITY): Payer: Self-pay | Admitting: *Deleted

## 2011-04-09 ENCOUNTER — Inpatient Hospital Stay (HOSPITAL_COMMUNITY)
Admission: RE | Admit: 2011-04-09 | Discharge: 2011-04-10 | DRG: 039 | Disposition: A | Discharge status: Home / Self Care | Payer: Medicare Other | Source: Ambulatory Visit | Attending: Vascular Surgery | Admitting: Vascular Surgery

## 2011-04-09 DIAGNOSIS — M1990 Unspecified osteoarthritis, unspecified site: Secondary | ICD-10-CM | POA: Diagnosis present

## 2011-04-09 DIAGNOSIS — K219 Gastro-esophageal reflux disease without esophagitis: Secondary | ICD-10-CM | POA: Diagnosis present

## 2011-04-09 DIAGNOSIS — E785 Hyperlipidemia, unspecified: Secondary | ICD-10-CM | POA: Diagnosis present

## 2011-04-09 DIAGNOSIS — M109 Gout, unspecified: Secondary | ICD-10-CM | POA: Diagnosis present

## 2011-04-09 DIAGNOSIS — I6529 Occlusion and stenosis of unspecified carotid artery: Secondary | ICD-10-CM

## 2011-04-09 DIAGNOSIS — Z79899 Other long term (current) drug therapy: Secondary | ICD-10-CM

## 2011-04-09 DIAGNOSIS — Z01812 Encounter for preprocedural laboratory examination: Secondary | ICD-10-CM

## 2011-04-09 DIAGNOSIS — Z7982 Long term (current) use of aspirin: Secondary | ICD-10-CM

## 2011-04-09 DIAGNOSIS — I10 Essential (primary) hypertension: Secondary | ICD-10-CM | POA: Diagnosis present

## 2011-04-09 DIAGNOSIS — Z87891 Personal history of nicotine dependence: Secondary | ICD-10-CM

## 2011-04-09 DIAGNOSIS — E119 Type 2 diabetes mellitus without complications: Secondary | ICD-10-CM | POA: Diagnosis present

## 2011-04-09 DIAGNOSIS — Z888 Allergy status to other drugs, medicaments and biological substances status: Secondary | ICD-10-CM

## 2011-04-09 DIAGNOSIS — I2510 Atherosclerotic heart disease of native coronary artery without angina pectoris: Secondary | ICD-10-CM | POA: Diagnosis present

## 2011-04-09 DIAGNOSIS — I658 Occlusion and stenosis of other precerebral arteries: Secondary | ICD-10-CM | POA: Diagnosis present

## 2011-04-09 DIAGNOSIS — I739 Peripheral vascular disease, unspecified: Secondary | ICD-10-CM | POA: Diagnosis present

## 2011-04-09 DIAGNOSIS — R29898 Other symptoms and signs involving the musculoskeletal system: Secondary | ICD-10-CM | POA: Diagnosis present

## 2011-04-09 DIAGNOSIS — I69998 Other sequelae following unspecified cerebrovascular disease: Secondary | ICD-10-CM

## 2011-04-09 DIAGNOSIS — D649 Anemia, unspecified: Secondary | ICD-10-CM | POA: Diagnosis present

## 2011-04-09 DIAGNOSIS — Z87440 Personal history of urinary (tract) infections: Secondary | ICD-10-CM

## 2011-04-09 DIAGNOSIS — Z01810 Encounter for preprocedural cardiovascular examination: Secondary | ICD-10-CM

## 2011-04-09 DIAGNOSIS — Z7902 Long term (current) use of antithrombotics/antiplatelets: Secondary | ICD-10-CM

## 2011-04-09 HISTORY — PX: ENDARTERECTOMY: SHX5162

## 2011-04-09 LAB — GLUCOSE, CAPILLARY: Glucose-Capillary: 101 mg/dL — ABNORMAL HIGH (ref 70–99)

## 2011-04-09 LAB — HEMOGLOBIN A1C: Mean Plasma Glucose: 128 mg/dL — ABNORMAL HIGH (ref <117)

## 2011-04-09 SURGERY — ENDARTERECTOMY, CAROTID
Anesthesia: General | Site: Neck | Laterality: Left | Wound class: Clean

## 2011-04-09 MED ORDER — TEMAZEPAM 15 MG PO CAPS: 15.0000 mg | ORAL_CAPSULE | Freq: Every evening | ORAL | Status: DC | PRN

## 2011-04-09 MED ORDER — PROTAMINE SULFATE 10 MG/ML IV SOLN
INTRAVENOUS | Status: DC | PRN
  Administered 2011-04-09: 20 mg via INTRAVENOUS
  Administered 2011-04-09 (×2): 10 mg via INTRAVENOUS

## 2011-04-09 MED ORDER — DEXTRAN 40 IN SALINE 10-0.9 % IV SOLN
INTRAVENOUS | Status: DC | PRN
  Administered 2011-04-09: 100 mL

## 2011-04-09 MED ORDER — ASPIRIN EC 325 MG PO TBEC
325.0000 mg | DELAYED_RELEASE_TABLET | Freq: Every day | ORAL | Status: DC
  Administered 2011-04-09 – 2011-04-10 (×2): 325 mg via ORAL
  Filled 2011-04-09 (×3): qty 1

## 2011-04-09 MED ORDER — METOPROLOL TARTRATE 1 MG/ML IV SOLN: 2.0000 mg | INTRAVENOUS | Status: DC | PRN

## 2011-04-09 MED ORDER — HEPARIN SODIUM (PORCINE) 1000 UNIT/ML IJ SOLN
INTRAMUSCULAR | Status: DC | PRN
  Administered 2011-04-09: 8000 [IU] via INTRAVENOUS

## 2011-04-09 MED ORDER — SODIUM CHLORIDE 0.9 % IV SOLN: 500.0000 mL | Freq: Once | INTRAVENOUS | Status: AC | PRN

## 2011-04-09 MED ORDER — ONDANSETRON HCL 4 MG/2ML IJ SOLN
INTRAMUSCULAR | Status: DC | PRN
  Administered 2011-04-09: 4 mg via INTRAVENOUS

## 2011-04-09 MED ORDER — NIACIN ER (ANTIHYPERLIPIDEMIC) 500 MG PO TBCR
1000.0000 mg | EXTENDED_RELEASE_TABLET | Freq: Every day | ORAL | Status: DC
  Filled 2011-04-09 (×2): qty 2

## 2011-04-09 MED ORDER — DOCUSATE SODIUM 100 MG PO CAPS
100.0000 mg | ORAL_CAPSULE | Freq: Every day | ORAL | Status: DC
  Administered 2011-04-10: 100 mg via ORAL
  Filled 2011-04-09: qty 1

## 2011-04-09 MED ORDER — ONE-DAILY MULTI VITAMINS PO TABS: 1.0000 | ORAL_TABLET | Freq: Every day | ORAL | Status: DC

## 2011-04-09 MED ORDER — LIDOCAINE HCL (CARDIAC) 20 MG/ML IV SOLN
INTRAVENOUS | Status: DC | PRN
  Administered 2011-04-09: 80 mg via INTRAVENOUS

## 2011-04-09 MED ORDER — LABETALOL HCL 5 MG/ML IV SOLN: 10.0000 mg | INTRAVENOUS | Status: DC | PRN

## 2011-04-09 MED ORDER — NEOSTIGMINE METHYLSULFATE 1 MG/ML IJ SOLN
INTRAMUSCULAR | Status: DC | PRN
  Administered 2011-04-09: 3 mg via INTRAVENOUS

## 2011-04-09 MED ORDER — GUAIFENESIN-DM 100-10 MG/5ML PO SYRP: 15.0000 mL | ORAL_SOLUTION | ORAL | Status: DC | PRN

## 2011-04-09 MED ORDER — INSULIN ASPART 100 UNIT/ML ~~LOC~~ SOLN
0.0000 [IU] | Freq: Three times a day (TID) | SUBCUTANEOUS | Status: DC
  Filled 2011-04-09: qty 3

## 2011-04-09 MED ORDER — POTASSIUM CHLORIDE CRYS ER 20 MEQ PO TBCR: 20.0000 meq | EXTENDED_RELEASE_TABLET | Freq: Once | ORAL | Status: AC | PRN

## 2011-04-09 MED ORDER — PROPOFOL 10 MG/ML IV EMUL
INTRAVENOUS | Status: DC | PRN
  Administered 2011-04-09: 160 mg via INTRAVENOUS

## 2011-04-09 MED ORDER — PHENOL 1.4 % MT LIQD: 1.0000 | OROMUCOSAL | Status: DC | PRN

## 2011-04-09 MED ORDER — ACETAMINOPHEN 650 MG RE SUPP: 325.0000 mg | RECTAL | Status: DC | PRN

## 2011-04-09 MED ORDER — OXYCODONE HCL 5 MG PO TABS: 5.0000 mg | ORAL_TABLET | ORAL | Status: DC | PRN

## 2011-04-09 MED ORDER — EPHEDRINE SULFATE 50 MG/ML IJ SOLN
INTRAMUSCULAR | Status: DC | PRN
  Administered 2011-04-09 (×4): 10 mg via INTRAVENOUS

## 2011-04-09 MED ORDER — DEXTRAN 40 IN SALINE 10-0.9 % IV SOLN
10.0000 mL/h | INTRAVENOUS | Status: DC
  Filled 2011-04-09: qty 500

## 2011-04-09 MED ORDER — POTASSIUM CHLORIDE IN NACL 20-0.9 MEQ/L-% IV SOLN
INTRAVENOUS | Status: DC
  Administered 2011-04-09: 15:00:00 via INTRAVENOUS
  Filled 2011-04-09 (×3): qty 1000

## 2011-04-09 MED ORDER — LIDOCAINE-EPINEPHRINE (PF) 1 %-1:200000 IJ SOLN
INTRAMUSCULAR | Status: DC | PRN
  Administered 2011-04-09: 10 mL

## 2011-04-09 MED ORDER — MORPHINE SULFATE 2 MG/ML IJ SOLN: 2.0000 mg | INTRAMUSCULAR | Status: DC | PRN

## 2011-04-09 MED ORDER — CLOPIDOGREL BISULFATE 75 MG PO TABS
75.0000 mg | ORAL_TABLET | Freq: Every day | ORAL | Status: DC
  Administered 2011-04-09 – 2011-04-10 (×2): 75 mg via ORAL
  Filled 2011-04-09 (×2): qty 1

## 2011-04-09 MED ORDER — METOPROLOL TARTRATE 25 MG PO TABS
25.0000 mg | ORAL_TABLET | Freq: Two times a day (BID) | ORAL | Status: DC
  Administered 2011-04-10: 25 mg via ORAL
  Filled 2011-04-09 (×4): qty 1

## 2011-04-09 MED ORDER — FAMOTIDINE IN NACL 20-0.9 MG/50ML-% IV SOLN
20.0000 mg | Freq: Two times a day (BID) | INTRAVENOUS | Status: DC
  Administered 2011-04-09 (×2): 20 mg via INTRAVENOUS
  Filled 2011-04-09 (×4): qty 50

## 2011-04-09 MED ORDER — ADULT MULTIVITAMIN W/MINERALS CH
1.0000 | ORAL_TABLET | Freq: Every day | ORAL | Status: DC
  Administered 2011-04-09 – 2011-04-10 (×2): 1 via ORAL
  Filled 2011-04-09 (×2): qty 1

## 2011-04-09 MED ORDER — ASPIRIN EC 325 MG PO TBEC: 325.0000 mg | DELAYED_RELEASE_TABLET | Freq: Every day | ORAL | Status: DC

## 2011-04-09 MED ORDER — ROCURONIUM BROMIDE 100 MG/10ML IV SOLN
INTRAVENOUS | Status: DC | PRN
  Administered 2011-04-09: 50 mg via INTRAVENOUS

## 2011-04-09 MED ORDER — TAMSULOSIN HCL 0.4 MG PO CAPS
0.4000 mg | ORAL_CAPSULE | Freq: Every day | ORAL | Status: DC
  Administered 2011-04-10: 0.4 mg via ORAL
  Filled 2011-04-09 (×2): qty 1

## 2011-04-09 MED ORDER — GLYBURIDE 2.5 MG PO TABS
2.5000 mg | ORAL_TABLET | Freq: Two times a day (BID) | ORAL | Status: DC
  Administered 2011-04-10: 2.5 mg via ORAL
  Filled 2011-04-09 (×4): qty 1

## 2011-04-09 MED ORDER — GLYCOPYRROLATE 0.2 MG/ML IJ SOLN
INTRAMUSCULAR | Status: DC | PRN
  Administered 2011-04-09: .4 mg via INTRAVENOUS

## 2011-04-09 MED ORDER — FENTANYL CITRATE 0.05 MG/ML IJ SOLN
INTRAMUSCULAR | Status: DC | PRN
  Administered 2011-04-09: 100 ug via INTRAVENOUS
  Administered 2011-04-09: 50 ug via INTRAVENOUS

## 2011-04-09 MED ORDER — 0.9 % SODIUM CHLORIDE (POUR BTL) OPTIME
TOPICAL | Status: DC | PRN
  Administered 2011-04-09: 1000 mL

## 2011-04-09 MED ORDER — PHENYLEPHRINE HCL 10 MG/ML IJ SOLN
INTRAMUSCULAR | Status: DC | PRN
  Administered 2011-04-09 (×2): 80 ug via INTRAVENOUS

## 2011-04-09 MED ORDER — LACTATED RINGERS IV SOLN
INTRAVENOUS | Status: DC | PRN
  Administered 2011-04-09 (×2): via INTRAVENOUS

## 2011-04-09 MED ORDER — OMEGA-3 FATTY ACIDS 1000 MG PO CAPS: 1.0000 g | ORAL_CAPSULE | Freq: Every day | ORAL | Status: DC

## 2011-04-09 MED ORDER — SODIUM CHLORIDE 0.9 % IR SOLN
Status: DC | PRN
  Administered 2011-04-09: 08:00:00

## 2011-04-09 MED ORDER — OMEGA-3-ACID ETHYL ESTERS 1 G PO CAPS
1.0000 g | ORAL_CAPSULE | Freq: Every day | ORAL | Status: DC
  Administered 2011-04-09 – 2011-04-10 (×2): 1 g via ORAL
  Filled 2011-04-09 (×2): qty 1

## 2011-04-09 MED ORDER — ONDANSETRON HCL 4 MG/2ML IJ SOLN: 4.0000 mg | Freq: Four times a day (QID) | INTRAMUSCULAR | Status: DC | PRN

## 2011-04-09 MED ORDER — HYDROMORPHONE HCL PF 1 MG/ML IJ SOLN: 0.2500 mg | INTRAMUSCULAR | Status: DC | PRN

## 2011-04-09 MED ORDER — SIMVASTATIN 20 MG PO TABS
20.0000 mg | ORAL_TABLET | Freq: Every day | ORAL | Status: DC
  Filled 2011-04-09 (×2): qty 1

## 2011-04-09 MED ORDER — HYDRALAZINE HCL 20 MG/ML IJ SOLN
10.0000 mg | INTRAMUSCULAR | Status: DC | PRN
  Filled 2011-04-09: qty 0.5

## 2011-04-09 MED ORDER — RAMIPRIL 2.5 MG PO CAPS
2.5000 mg | ORAL_CAPSULE | Freq: Every day | ORAL | Status: DC
  Administered 2011-04-09 – 2011-04-10 (×2): 2.5 mg via ORAL
  Filled 2011-04-09 (×2): qty 1

## 2011-04-09 MED ORDER — DEXTROSE 5 % IV SOLN
1.5000 g | Freq: Two times a day (BID) | INTRAVENOUS | Status: AC
  Administered 2011-04-09 – 2011-04-10 (×2): 1.5 g via INTRAVENOUS
  Filled 2011-04-09 (×2): qty 1.5

## 2011-04-09 MED ORDER — ACETAMINOPHEN 325 MG PO TABS: 325.0000 mg | ORAL_TABLET | ORAL | Status: DC | PRN

## 2011-04-09 MED ORDER — DOPAMINE-DEXTROSE 3.2-5 MG/ML-% IV SOLN: 3.0000 ug/kg/min | INTRAVENOUS | Status: DC

## 2011-04-09 SURGICAL SUPPLY — 51 items
ADH SKN CLS APL DERMABOND .7 (GAUZE/BANDAGES/DRESSINGS) ×1
BAG DECANTER FOR FLEXI CONT (MISCELLANEOUS) ×2 IMPLANT
CANISTER SUCTION 2500CC (MISCELLANEOUS) ×2 IMPLANT
CANNULA VESSEL W/WING WO/VALVE (CANNULA) ×2 IMPLANT
CATH ROBINSON RED A/P 18FR (CATHETERS) ×2 IMPLANT
CLIP TI MEDIUM 24 (CLIP) ×2 IMPLANT
CLIP TI WIDE RED SMALL 24 (CLIP) ×2 IMPLANT
CLOTH BEACON ORANGE TIMEOUT ST (SAFETY) ×2 IMPLANT
COVER SURGICAL LIGHT HANDLE (MISCELLANEOUS) ×4 IMPLANT
CRADLE DONUT ADULT HEAD (MISCELLANEOUS) ×2 IMPLANT
DERMABOND ADVANCED (GAUZE/BANDAGES/DRESSINGS) ×1
DERMABOND ADVANCED .7 DNX12 (GAUZE/BANDAGES/DRESSINGS) ×1 IMPLANT
DRAIN CHANNEL 15F RND FF W/TCR (WOUND CARE) IMPLANT
DRAPE PROXIMA HALF (DRAPES) ×1 IMPLANT
DRAPE WARM FLUID 44X44 (DRAPE) ×2 IMPLANT
ELECT REM PT RETURN 9FT ADLT (ELECTROSURGICAL) ×2
ELECTRODE REM PT RTRN 9FT ADLT (ELECTROSURGICAL) ×1 IMPLANT
EVACUATOR SILICONE 100CC (DRAIN) IMPLANT
GLOVE BIO SURGEON STRL SZ 6.5 (GLOVE) ×2 IMPLANT
GLOVE BIO SURGEON STRL SZ7.5 (GLOVE) ×2 IMPLANT
GLOVE BIOGEL PI IND STRL 7.5 (GLOVE) ×1 IMPLANT
GLOVE BIOGEL PI INDICATOR 7.5 (GLOVE) ×3
GLOVE ECLIPSE 6.5 STRL STRAW (GLOVE) ×4 IMPLANT
GOWN PREVENTION PLUS XXLARGE (GOWN DISPOSABLE) ×6 IMPLANT
GOWN STRL NON-REIN LRG LVL3 (GOWN DISPOSABLE) ×4 IMPLANT
KIT BASIN OR (CUSTOM PROCEDURE TRAY) ×2 IMPLANT
KIT ROOM TURNOVER OR (KITS) ×2 IMPLANT
NDL HYPO 25X1 1.5 SAFETY (NEEDLE) ×1 IMPLANT
NEEDLE HYPO 25X1 1.5 SAFETY (NEEDLE) ×2 IMPLANT
NS IRRIG 1000ML POUR BTL (IV SOLUTION) ×4 IMPLANT
PACK CAROTID (CUSTOM PROCEDURE TRAY) ×2 IMPLANT
PAD ARMBOARD 7.5X6 YLW CONV (MISCELLANEOUS) ×4 IMPLANT
PATCH HEMASHIELD 8X75 (Vascular Products) ×4 IMPLANT
SHUNT CAROTID BYPASS 10 (VASCULAR PRODUCTS) IMPLANT
SHUNT CAROTID BYPASS 12 (VASCULAR PRODUCTS) ×1 IMPLANT
SHUNT CAROTID BYPASS 12FRX15.5 (VASCULAR PRODUCTS) IMPLANT
SPECIMEN JAR SMALL (MISCELLANEOUS) ×2 IMPLANT
SPONGE SURGIFOAM ABS GEL 100 (HEMOSTASIS) IMPLANT
SUT PROLENE 6 0 BV (SUTURE) ×3 IMPLANT
SUT PROLENE 7 0 BV 1 (SUTURE) IMPLANT
SUT SILK 2 0 FS (SUTURE) IMPLANT
SUT SILK 4 0 (SUTURE) ×2
SUT SILK 4-0 18XBRD TIE 12 (SUTURE) IMPLANT
SUT VIC AB 3-0 SH 27 (SUTURE) ×2
SUT VIC AB 3-0 SH 27X BRD (SUTURE) ×1 IMPLANT
SUT VICRYL 4-0 PS2 18IN ABS (SUTURE) ×2 IMPLANT
SYR CONTROL 10ML LL (SYRINGE) ×2 IMPLANT
TOWEL OR 17X24 6PK STRL BLUE (TOWEL DISPOSABLE) ×2 IMPLANT
TOWEL OR 17X26 10 PK STRL BLUE (TOWEL DISPOSABLE) ×2 IMPLANT
TRAY FOLEY CATH 14FRSI W/METER (CATHETERS) ×2 IMPLANT
WATER STERILE IRR 1000ML POUR (IV SOLUTION) ×2 IMPLANT

## 2011-04-09 NOTE — Progress Notes (Signed)
VASCULAR PROGRESS NOTE  SUBJECTIVE: Resting comfortably  PHYSICAL EXAM: Filed Vitals:   04/09/11 1215 04/09/11 1219 04/09/11 1230 04/09/11 1300  BP:  108/52    Pulse: 62  63   Temp:      TempSrc:      Resp: 17  20   Height:    5\' 11"  (1.803 m)  Weight:    197 lb 12 oz (89.7 kg)  SpO2: 98%  97%    Lungs: clear to auscultation Neuro: intact Incision looks fine  ASSESSMENT/PLAN: 1. Stable postop 2. Anticipate discharge in AM  Waverly Ferrari, MD, FACS Beeper: [phone] 04/09/2011

## 2011-04-09 NOTE — Op Note (Signed)
NAME: [name]   MRN: [id number] DOB: Jan 24, 1932    DATE OF OPERATION: 04/09/2011  PREOP DIAGNOSIS: Asymptomatic bilateral carotid artery stenoses  POSTOP DIAGNOSIS: same  PROCEDURE: Left carotid endarterectomy with Dacron patch angioplasty.  SURGEON: Di Kindle. Edilia Bo, MD, FACS  ASSIST: Newton Pigg,  PA  ANESTHESIA: Gen.   EBL: minimal  INDICATIONS: [name] is a 78 y.o. male who presented with bilateral critical carotid stenoses. Both stenoses were well beyond 80%. He presents for elective left carotid endarterectomy with plans for staged right carotid endarterectomy in 4-6 weeks.  FINDINGS: the stenosis was a 90%.  TECHNIQUE: The patient was taken to the operating room after arterial line was placed by anesthesia. The patient received a general anesthetic. The left neck was prepped and draped in the usual sterile fashion. An incision was made along the anterior border of the sternocleidomastoid and the dissection carried down to the common carotid artery which was dissected free and controlled with a Rummel tourniquet. The facial vein was divided between 2-0 silk ties. The internal carotid artery was controlled above the plaque and the superior thyroid artery and external carotid arteries were also controlled. The patient was then heparinized. After the heparin had circulated for 3 minutes clamps were placed on the internal then the external then the common carotid artery. A longitudinal arteriotomy was made in the common carotid artery and this was extended through the plaque into the internal carotid artery. A 12 shunt was placed into the internal carotid artery back bled and then placed into the common carotid artery and secured with Rummel tourniquet. Flow was reestablished through the shunt. An endarterectomy plane was established proximally the plaque was sharply divided. The vertical endarterectomy was performed of the external carotid artery. Distally  there was a nice tapering the plaque and no tacking sutures were required. Eversion endarterectomy was performed of the external carotid artery. The artery was irrigated with copious amounts of heparin and dextran all loose debris removed. A Dacron patch was then sewn using continuous 6-0 Prolene suture. Prior to completing the patch closure the shunt was removed. The artery was backbled and flushed appropriately and the anastomosis completed. Flows reestablished first to the external carotid artery and into the internal carotid artery. At the completion was an excellent Doppler signal distal to the patch in a good pulse. The heparin was partially reversed with protamine. The wounds irrigated. The deep layer was closed with a running 3-0 Vicryl. The platysma was closed with a running 3-0 Vicryl. The skin was closed with 4-0 subcuticular stitch. Dermabond was applied. The patient awoke neurologically intact. All needle and sponge counts were correct. He was transferred to the recovery room in stable condition.  Waverly Ferrari, MD, FACS [facility]  DATE OF DICTATION:   04/09/2011

## 2011-04-09 NOTE — Anesthesia Preprocedure Evaluation (Addendum)
Anesthesia Evaluation  Patient identified by MRN, date of birth, ID band Patient awake    Reviewed: Allergy & Precautions, H&P , NPO status , Patient's Chart, lab work & pertinent test results  History of Anesthesia Complications Negative for: history of anesthetic complications  Airway Mallampati: II  Neck ROM: full    Dental   Pulmonary shortness of breath,          Cardiovascular hypertension, Pt. on medications and Pt. on home beta blockers + CAD and + Peripheral Vascular Disease Regular Normal    Neuro/Psych CVA    GI/Hepatic GERD-  Medicated,  Endo/Other  Diabetes mellitus-, Type 2, Oral Hypoglycemic Agents  Renal/GU      Musculoskeletal   Abdominal   Peds  Hematology   Anesthesia Other Findings   Reproductive/Obstetrics                          Anesthesia Physical Anesthesia Plan  ASA: III  Anesthesia Plan: General   Post-op Pain Management:    Induction: Intravenous  Airway Management Planned: Oral ETT  Additional Equipment: Arterial line  Intra-op Plan:   Post-operative Plan: Extubation in OR  Informed Consent: I have reviewed the patients History and Physical, chart, labs and discussed the procedure including the risks, benefits and alternatives for the proposed anesthesia with the patient or authorized representative who has indicated his/her understanding and acceptance.     Plan Discussed with: CRNA and Surgeon  Anesthesia Plan Comments:         Anesthesia Quick Evaluation

## 2011-04-09 NOTE — Anesthesia Procedure Notes (Signed)
Procedure Name: Intubation Date/Time: 04/09/2011 7:42 AM Performed by: Fuller Canada Pre-anesthesia Checklist: Patient identified, Timeout performed, Emergency Drugs available, Suction available and Patient being monitored Patient Re-evaluated:Patient Re-evaluated prior to inductionOxygen Delivery Method: Circle System Utilized Preoxygenation: Pre-oxygenation with 100% oxygen Intubation Type: IV induction Ventilation: Mask ventilation without difficulty Laryngoscope Size: Mac and 3 Grade View: Grade I Tube type: Oral Tube size: 7.5 mm Number of attempts: 1 Airway Equipment and Method: stylet Placement Confirmation: breath sounds checked- equal and bilateral,  ETT inserted through vocal cords under direct vision and positive ETCO2 Secured at: 22 cm Tube secured with: Tape Dental Injury: Teeth and Oropharynx as per pre-operative assessment

## 2011-04-09 NOTE — Progress Notes (Signed)
Spoke with dr Gelene Mink  Re ekg showing L BBB, , STATED WAS OKAY.

## 2011-04-09 NOTE — H&P (Signed)
[facility]     HISTORY & PHYSICAL EXAMINATION   Patient name: [name]      MRN: [id number]        DOB: 1931-10-11        Sex: male   REASON FOR CONSULT: Bilateral severe carotid artery stenoses. Referred by Dr. Paulita Cradle.   HPI: [name] is a 78 y.o. male who I had last seen in December of 2009. He had a right hemispheric stroke associated with some left arm and left lower extremity weakness. His carotid duplex scan back in showed bilateral 40-59% carotid stenoses. I planned for him to continue with routine follow carotid duplex scans and I believe this is been done at the [facility] office. I have not seen him in 3 years. He had a carotid duplex scan done in our office on 01/26/2011 which shows that he has severe bilateral carotid stenoses. He was sent for vascular consultation.  The patient has had no subsequent history of stroke since the event in 2009. He's had no expressive or receptive aphasia or amaurosis fugax. His had no history of TIAs. He does have a history of type 2 diabetes which is stable on his current medications. In addition his hypertension has been stable on his current medications.    Past Medical History   Diagnosis  Date   .  Diabetes mellitus type 2     .  CAD (coronary artery disease) of artery bypass graft     .  GERD (gastroesophageal reflux disease)     .  Hyperlipidemia     .  CVA (cerebral vascular accident)         left    .  DJD (degenerative joint disease)     .  Urosepsis     .  Anemia     .  Hypertension     .  Peripheral vascular disease        Family History   Problem  Relation  Age of Onset   .  Diabetes  Sister     .  Cancer  Brother     .  Diabetes  Brother      SOCIAL HISTORY: History   Substance Use Topics   .  Smoking status:  Former Smoker       Types:  Cigarettes   .  Smokeless tobacco:  Not on file     Comment: quit in 1976   .  Alcohol Use:  Yes         quit in 1981      Allergies   Allergen  Reactions   .  Afrin Nasal Spray     .  Allegra     .  Flonase (Fluticasone Propionate)        Current Outpatient Prescriptions   Medication  Sig  Dispense  Refill   .  clopidogrel (PLAVIX) 75 MG tablet  Take 75 mg by mouth daily.           .  fish oil-omega-3 fatty acids 1000 MG capsule  Take 2 g by mouth daily.           .  Flaxseed, Linseed, (FLAX SEED OIL PO)  Take by mouth.           .  glyBURIDE (DIABETA) 2.5 MG tablet  Take 2.5 mg by mouth 2 (two) times daily with a meal.           .  meloxicam (MOBIC) 15 MG tablet  Take 15 mg by mouth daily.           .  metoprolol (LOPRESSOR) 50 MG tablet  Take 50 mg by mouth 2 (two) times daily. Take half tablet by mouth two times a day              .  Multiple Vitamin (MULTIVITAMIN) tablet  Take 1 tablet by mouth daily.           .  niacin (NIASPAN) 1000 MG CR tablet  Take 1,000 mg by mouth at bedtime.           .  ramipril (ALTACE) 2.5 MG tablet  Take 2.5 mg by mouth daily.           .  simvastatin (ZOCOR) 20 MG tablet  Take 20 mg by mouth at bedtime.            REVIEW OF SYSTEMS: Arly.Keller ] denotes positive finding; [  ] denotes negative finding CARDIOVASCULAR:  [ ]  chest pain   [ ]  chest pressure   [ ]  palpitations   [ ]  orthopnea               [ ]  dyspnea on exertion   Arly.Keller ] claudication   [ ]  rest pain   [ ]  DVT   [ ]  phlebitis PULMONARY:   [ ]  productive cough   [ ]  asthma   [ ]  wheezing NEUROLOGIC:   [ ]  weakness  [ ]  paresthesias  [ ]  aphasia  [ ]  amaurosis  Arly.Keller ] dizziness- when standing rapidly HEMATOLOGIC:   [ ]  bleeding problems   [ ]  clotting disorders MUSCULOSKELETAL:  [ ]  joint pain   [ ]  joint swelling [ ]  leg swelling GASTROINTESTINAL: [ ]   blood in stool  [ ]   hematemesis GENITOURINARY:  [ ]   dysuria  [ ]   hematuria PSYCHIATRIC:  [ ]  history of major depression INTEGUMENTARY:  [ ]  rashes  [ ]  ulcers CONSTITUTIONAL:  [ ]  fever   [ ]  chills  PHYSICAL EXAM: Filed Vitals:   04/09/11 0605  BP: 136/67    Pulse: 58  Temp: 97.8 F (36.6 C)  Resp: 18   Body mass index is 37.03 kg/(m^2). GENERAL: The patient is a well-nourished male, in no acute distress. The vital signs are documented above. CARDIOVASCULAR: There is a regular rate and rhythm without significant murmur appreciated. He has bilateral carotid bruits. He has palpable femoral pulses and warm well-perfused feet. PULMONARY: There is good air exchange bilaterally without wheezing or rales. ABDOMEN: Soft and non-tender with normal pitched bowel sounds.   MUSCULOSKELETAL: There are no major deformities or cyanosis. NEUROLOGIC: No focal weakness or paresthesias are detected. SKIN: There are no ulcers or rashes noted. PSYCHIATRIC: The patient has a normal affect.  DATA:   1. I have independently interpreted his carotid duplex scan done 01/26/2011. This shows severe bilateral carotid stenoses. The end diastolic velocity on the right is 252 cm/s. The end diastolic velocity on the left is 287 cm/s. Both vertebral arteries are patent with normally directed flow.  MEDICAL ISSUES:  Given that the carotid stenoses have progressed to greater than 80%, I have recommended staged bilateral carotid endarterectomies. His left carotid endarterectomy is scheduled for 02/17/2011. I would plan on proceeding with right carotid endarterectomy in approximately 4 weeks postoperatively. Have instructed him to discontinue his Plavix preoperatively and did begin taking aspirin 81 mg a day.  I  have reviewed the indications for carotid endarterectomy, that is to lower the risk of future stroke. I have also reviewed the potential complications of surgery, including but not limited to: bleeding, stroke (perioperative risk 1-2%), MI, nerve injury of other unpredictable medical problems. All of the patients questions were answered and they are agreeable to proceed with surgery.    DICKSON,CHRISTOPHER S [facility] [place] Beeper:  [phone]

## 2011-04-09 NOTE — Transfer of Care (Signed)
Immediate Anesthesia Transfer of Care Note  Patient: [name]  Procedure(s) Performed:  ENDARTERECTOMY CAROTID - Left Carotid endarterectomy With Dacron Patch Angioplasty  Patient Location: PACU  Anesthesia Type: General  Level of Consciousness: awake, alert , oriented and patient cooperative  Airway & Oxygen Therapy: Patient Spontanous Breathing and Patient connected to nasal cannula oxygen  Post-op Assessment: Report given to PACU RN, Post -op Vital signs reviewed and stable, Patient moving all extremities X 4 and Patient able to stick tongue midline  Post vital signs: Reviewed and stable  Complications: No apparent anesthesia complications

## 2011-04-09 NOTE — Anesthesia Postprocedure Evaluation (Signed)
Anesthesia Post Note  Patient: [name]  Procedure(s) Performed:  ENDARTERECTOMY CAROTID - Left Carotid endarterectomy With Dacron Patch Angioplasty  Anesthesia type: General  Patient location: PACU  Post pain: Pain level controlled and Adequate analgesia  Post assessment: Post-op Vital signs reviewed, Patient's Cardiovascular Status Stable, Respiratory Function Stable, Patent Airway and Pain level controlled  Last Vitals:  Filed Vitals:   04/09/11 1104  BP: 112/45  Pulse:   Temp:   Resp:     Post vital signs: Reviewed and stable  Level of consciousness: awake, alert  and oriented  Complications: No apparent anesthesia complications

## 2011-04-10 ENCOUNTER — Encounter (HOSPITAL_COMMUNITY): Payer: Self-pay | Admitting: Vascular Surgery

## 2011-04-10 LAB — BASIC METABOLIC PANEL
BUN: 11 mg/dL (ref 6–23)
CO2: 25 mEq/L (ref 19–32)
Chloride: 104 mEq/L (ref 96–112)
GFR calc non Af Amer: 70 mL/min — ABNORMAL LOW (ref >90)
Glucose, Bld: 81 mg/dL (ref 70–99)
Potassium: 3.9 mEq/L (ref 3.5–5.1)

## 2011-04-10 LAB — GLUCOSE, CAPILLARY

## 2011-04-10 LAB — CBC
HCT: 33.8 % — ABNORMAL LOW (ref 39.0–52.0)
Hemoglobin: 11.3 g/dL — ABNORMAL LOW (ref 13.0–17.0)
MCHC: 33.4 g/dL (ref 30.0–36.0)
RBC: 3.62 MIL/uL — ABNORMAL LOW (ref 4.22–5.81)

## 2011-04-10 MED ORDER — OXYCODONE HCL 5 MG PO TABS: 5.0000 mg | ORAL_TABLET | ORAL | Status: AC | PRN

## 2011-04-10 MED ORDER — FAMOTIDINE 20 MG PO TABS
20.0000 mg | ORAL_TABLET | Freq: Every day | ORAL | Status: AC
  Administered 2011-04-10: 20 mg via ORAL
  Filled 2011-04-10 (×2): qty 1

## 2011-04-10 NOTE — Discharge Summary (Signed)
[facility] Discharge Summary  [name] 1931-07-17 78 y.o. male  [id number]  Admission Date: 04/09/2011  Discharge Date: 04/10/11  Physician: Chuck Hint, MD  Admission Diagnosis: Bil ICA stenosis   HPI:   This is a 78 y.o. male who I had last seen in December of 2009. He had a right hemispheric stroke associated with some left arm and left lower extremity weakness. His carotid duplex scan back in showed bilateral 40-59% carotid stenoses. I planned for him to continue with routine follow carotid duplex scans and I believe this is been done at the [facility] office. I have not seen him in 3 years. He had a carotid duplex scan done in our office on 01/26/2011 which shows that he has severe bilateral carotid stenoses. He was sent for vascular consultation.  The patient has had no subsequent history of stroke since the event in 2009. He's had no expressive or receptive aphasia or amaurosis fugax. His had no history of TIAs. He does have a history of type 2 diabetes which is stable on his current medications. In addition his hypertension has been stable on his current medications.     Hospital Course:  The patient was admitted to the hospital and taken to the operating room on 04/09/2011 and underwent Left CEA.  The pt tolerated the procedure well and was transported to the PACU in good condition. By POD 1, his neuro exam was in tact and he was doing well.   The remainder of the hospital course consisted of increasing ambulation and increasing intake of solids without difficulty.    Basename 04/10/11 0335 04/07/11 0941  NA 138 136  K 3.9 4.4  CL 104 99  CO2 25 29  GLUCOSE 81 162*  BUN 11 14  CALCIUM 8.5 9.0    Basename 04/10/11 0335 04/07/11 0941  WBC 7.0 7.2  HGB 11.3* 13.6  HCT 33.8* 40.0  PLT 162 196    Basename 04/07/11 0941  INR 0.98     Discharge Instructions:   The patient is discharged to home with extensive instructions on wound  care and progressive ambulation.  They are instructed not to drive or perform any heavy lifting until returning to see the physician in his office.   Shower daily with soap and water. Discharge Orders    Future Appointments: Provider: Department: Dept Phone: Center:   04/29/2011 11:00 AM [facility] [phone] [facility]   04/29/2011 12:30 PM Chuck Hint, MD [facility] [phone] [facility]     Future Orders Please Complete By Expires   Resume previous diet      Driving Restrictions      Comments:   No driving for 2 weeks   Lifting restrictions      Comments:   No lifting for 4 weeks   Call MD for:  temperature >100.5      Call MD for:  redness, tenderness, or signs of infection (pain, swelling, bleeding, redness, odor or green/yellow discharge around incision site)      Call MD for:  severe or increased pain, loss or decreased feeling  in affected limb(s)      CAROTID Sugery: Call MD for difficulty swallowing or speaking; weakness in arms or legs that is a new symtom; severe headache.  If you have increased swelling in the neck and/or  are having difficulty breathing, CALL 911      may wash over wound with mild soap and water  Scheduling Instructions:   Shower daily with soap and water starting 04/10/11    Resume previous diet      Driving Restrictions      Comments:   No driving for 2 weeks   Lifting restrictions      Comments:   No lifting for 6 weeks   Call MD for:  temperature >100.5      Call MD for:  redness, tenderness, or signs of infection (pain, swelling, bleeding, redness, odor or green/yellow discharge around incision site)      Call MD for:  severe or increased pain, loss or decreased feeling  in affected limb(s)      CAROTID Sugery: Call MD for difficulty swallowing or speaking; weakness in arms or legs that is a new symtom; severe headache.  If you have increased swelling in the neck and/or  are having difficulty breathing, CALL 911          Discharge Diagnosis:  Bil ICA stenosis  Secondary Diagnosis: Patient Active Problem List  Diagnoses  . Occlusion and stenosis of carotid artery without mention of cerebral infarction  . Preop cardiovascular exam  . Diabetes mellitus type 2  . CAD (coronary artery disease) of artery bypass graft  . Hyperlipidemia  . Hypertension  . Peripheral vascular disease   Past Medical History  Diagnosis Date  . Diabetes mellitus type 2   . CAD (coronary artery disease) of artery bypass graft   . GERD (gastroesophageal reflux disease)   . Hyperlipidemia     takes Niacin nightly  . DJD (degenerative joint disease)   . Urosepsis   . Peripheral vascular disease   . Hypertension     takes Metoprolol and Ramipril daily  . Pneumonia     hx of 1959  . Dizziness   . CVA (cerebral vascular accident) 4+yrs ago    right arm/leg occ hurts  . Gout     hx of--73yrs ago  . Itching     on right leg  . Bruises easily     pt is on Plavix and ASA  . Shortness of breath     wakes pt up during night with occ shortness of breathe;states it happens about once a month  . Impaired hearing   . Urinary frequency   . UTI (lower urinary tract infection)     hx of--03/2010  . Anemia     takes Glyburide bid       Deshaun, [place]  Home Medication Instructions [id number]   Printed on:04/10/11 8119  Medication Information                    glyBURIDE (DIABETA) 2.5 MG tablet Take 2.5 mg by mouth 2 (two) times daily with a meal.            ramipril (ALTACE) 2.5 MG tablet Take 2.5 mg by mouth daily.            simvastatin (ZOCOR) 20 MG tablet Take 20 mg by mouth at bedtime.            niacin (NIASPAN) 1000 MG CR tablet Take 1,000 mg by mouth at bedtime.            fish oil-omega-3 fatty acids 1000 MG capsule Take 1 g by mouth daily.            Flaxseed, Linseed, (FLAX SEED OIL PO) Take 1,300 mg by mouth daily.  Multiple Vitamin (MULTIVITAMIN) tablet Take 1 tablet by mouth  daily.            metoprolol (LOPRESSOR) 50 MG tablet Take 25 mg by mouth 2 (two) times daily.            clopidogrel (PLAVIX) 75 MG tablet Take 75 mg by mouth daily.            aspirin EC 325 MG tablet Take 325 mg by mouth daily.            Tamsulosin HCl (FLOMAX) 0.4 MG CAPS Take 0.4 mg by mouth daily.           oxyCODONE (OXY IR/ROXICODONE) 5 MG immediate release tablet Take 1-2 tablets (5-10 mg total) by mouth every 4 (four) hours as needed.             Disposition: home   Patient's condition: is Good  Follow up: 1. Dr. Edilia Bo in 2 weeks.  At that time, we will schedule his right CEA and stop his Plavix at that time.   Newton Pigg, PA-C [facility] [phone] 04/10/2011  7:29 AM

## 2011-04-10 NOTE — Progress Notes (Signed)
VASCULAR PROGRESS NOTE  SUBJECTIVE: no complaints. Patient ambulating without difficulty and eating. No problems swallowing or difficulty breathing.  PHYSICAL EXAM: Filed Vitals:   04/09/11 1300 04/09/11 2000 04/10/11 0000 04/10/11 0400  BP:  122/56    Pulse:  78    Temp:  98 F (36.7 C) 98.2 F (36.8 C) 98.7 F (37.1 C)  TempSrc:  Oral Oral Oral  Resp:  19 19 18   Height: 5\' 11"  (1.803 m)     Weight: 197 lb 12 oz (89.7 kg)     SpO2:  98%     Lungs: clear to auscultation Neuro: Intact Left neck incision looks fine.  LABS: Lab Results  Component Value Date   WBC 7.0 04/10/2011   HGB 11.3* 04/10/2011   HCT 33.8* 04/10/2011   MCV 93.4 04/10/2011   PLT 162 04/10/2011   Lab Results  Component Value Date   CREATININE 1.01 04/10/2011   Lab Results  Component Value Date   INR 0.98 04/07/2011   CBG (last 3)   Basename 04/09/11 2144 04/09/11 1655 04/09/11 1021  GLUCAP 101* 93 161*     ASSESSMENT/PLAN: 1. 1 Day Post-Op s/p: left carotid endarterectomy 2. Plan discharge today. The office will call him in 3 weeks for a follow up visit. At that point we can schedule him for staged right carotid endarterectomy. He'll resume his Plavix and then discontinue his Plavix one-week preoperatively.  Waverly Ferrari, MD, FACS Beeper: [phone] 04/10/2011

## 2011-04-10 NOTE — Discharge Summary (Signed)
A degree with above. Plan follow up in 3 weeks to discuss right carotid endarterectomy. Di Kindle. Edilia Bo, MD, FACS Beeper [phone] 04/10/2011

## 2011-04-10 NOTE — Progress Notes (Addendum)
Pt given d/c instructions and reviewed d/c instructions. Pt given prescription for Oxycodone. All questions answered. Belongings with pt. Pt d/c via wheelchair home with wife.   Holly Bodily

## 2011-04-10 NOTE — Progress Notes (Signed)
Utilization review completed. Lynnox Girten, RN, BSN. 04/10/11  

## 2011-04-28 ENCOUNTER — Encounter: Payer: Self-pay | Admitting: Vascular Surgery

## 2011-04-29 ENCOUNTER — Other Ambulatory Visit: Payer: Self-pay

## 2011-04-29 ENCOUNTER — Ambulatory Visit (INDEPENDENT_AMBULATORY_CARE_PROVIDER_SITE_OTHER): Payer: Medicare Other | Admitting: Vascular Surgery

## 2011-04-29 ENCOUNTER — Encounter: Payer: Self-pay | Admitting: Vascular Surgery

## 2011-04-29 ENCOUNTER — Other Ambulatory Visit (INDEPENDENT_AMBULATORY_CARE_PROVIDER_SITE_OTHER): Payer: Medicare Other | Admitting: *Deleted

## 2011-04-29 VITALS — BP 115/55 | HR 78 | Resp 16 | Ht 71.0 in | Wt 195.0 lb

## 2011-04-29 DIAGNOSIS — I6529 Occlusion and stenosis of unspecified carotid artery: Secondary | ICD-10-CM

## 2011-04-29 NOTE — Progress Notes (Signed)
[facility] [place]  Patient name: [name] MRN: [id number] DOB: 1931/12/18 Sex: male HISTORY AND PHYSICAL EXAM:  CC: greater than 80% right carotid stenosis   HPI: [name] is a 78 y.o. male who had a history of a right hemispheric stroke associated with left arm weakness and left lower extremity weakness back in 2009. At that time he had 40-59% carotid stenoses. I not seen in many years and was sent back on 02/11/2011 after a carotid duplex scan done at the [facility] office and showed evidence of bilateral severe carotid disease. Since 2009, he had no further history of stroke, TIAs, expressive or receptive aphasia, or amaurosis fugax. I recommended staged bilateral carotid endarterectomies. He underwent preoperative cardiac clearance by Dr. Rollene Rotunda.  On 04/09/2011 he underwent left carotid endarterectomy with Dacron patch angioplasty and did very well. He was discharged on postoperative day #1. He was seen in follow up today in his had no history of stroke, TIAs, expressive or receptive aphasia, or amaurosis fugax. He is now ready to schedule right carotid endarterectomy. He has been back on his Plavix.  Past Medical History  Diagnosis Date  . Diabetes mellitus type 2   . CAD (coronary artery disease) of artery bypass graft   . GERD (gastroesophageal reflux disease)   . Hyperlipidemia     takes Niacin nightly  . DJD (degenerative joint disease)   . Urosepsis   . Peripheral vascular disease   . Hypertension     takes Metoprolol and Ramipril daily  . Pneumonia     hx of 1959  . Dizziness   . CVA (cerebral vascular accident) 4+yrs ago    right arm/leg occ hurts  . Gout     hx of--58yrs ago  . Itching     on right leg  . Bruises easily     pt is on Plavix and ASA  . Shortness of breath     wakes pt up during night with occ shortness of breathe;states it happens about once a month  . Impaired hearing   . Urinary frequency   . UTI  (lower urinary tract infection)     hx of--03/2010  . Anemia     takes Glyburide bid    Family History  Problem Relation Age of Onset  . Diabetes Sister   . Cancer Brother   . Diabetes Brother   . Anesthesia problems Neg Hx   . Hypotension Neg Hx   . Malignant hyperthermia Neg Hx   . Pseudochol deficiency Neg Hx     SOCIAL HISTORY: History  Substance Use Topics  . Smoking status: Former Smoker    Types: Cigarettes  . Smokeless tobacco: Not on file   Comment: quit 50yrs ago  . Alcohol Use: Yes     quit in 1981    Allergies  Allergen Reactions  . Afrin Nasal Spray Other (See Comments)    "goes berserk"   . Allegra Other (See Comments)    "loses it"  . Flonase (Fluticasone Propionate) Other (See Comments)    "goes berserk"    Current Outpatient Prescriptions  Medication Sig Dispense Refill  . aspirin EC 325 MG tablet Take 325 mg by mouth daily.       . clopidogrel (PLAVIX) 75 MG tablet Take 75 mg by mouth daily.       . fish oil-omega-3 fatty acids 1000 MG capsule Take 1 g by mouth daily.       Marland Kitchen  Flaxseed, Linseed, (FLAX SEED OIL PO) Take 1,300 mg by mouth daily.       Marland Kitchen glyBURIDE (DIABETA) 2.5 MG tablet Take 2.5 mg by mouth 2 (two) times daily with a meal.       . metoprolol (LOPRESSOR) 50 MG tablet Take 25 mg by mouth 2 (two) times daily.       . Multiple Vitamin (MULTIVITAMIN) tablet Take 1 tablet by mouth daily.       . niacin (NIASPAN) 1000 MG CR tablet Take 1,000 mg by mouth at bedtime.       . ramipril (ALTACE) 2.5 MG tablet Take 2.5 mg by mouth daily.       . simvastatin (ZOCOR) 20 MG tablet Take 20 mg by mouth at bedtime.       . Tamsulosin HCl (FLOMAX) 0.4 MG CAPS Take 0.4 mg by mouth daily.        REVIEW OF SYSTEMS: Arly.Keller ] denotes positive finding; [  ] denotes negative finding  CARDIOVASCULAR:  [ ]  chest pain   [ ]  chest pressure   [ ]  palpitations   [ ]  orthopnea   [ ]  dyspnea on exertion   [ ]  claudication   [ ]  rest pain   [ ]  DVT   [ ]   phlebitis PULMONARY:   [ ]  productive cough   [ ]  asthma   [ ]  wheezing NEUROLOGIC:   [ ]  weakness  [ ]  paresthesias  [ ]  aphasia  [ ]  amaurosis  [ ]  dizziness HEMATOLOGIC:   [ ]  bleeding problems   [ ]  clotting disorders MUSCULOSKELETAL:  [ ]  joint pain   [ ]  joint swelling [ ]  leg swelling GASTROINTESTINAL: [ ]   blood in stool  [ ]   hematemesis GENITOURINARY:  [ ]   dysuria  [ ]   hematuria PSYCHIATRIC:  [ ]  history of major depression INTEGUMENTARY:  [ ]  rashes  [ ]  ulcers CONSTITUTIONAL:  [ ]  fever   [ ]  chills  PHYSICAL EXAM: Filed Vitals:   04/29/11 1138 04/29/11 1139  BP: 105/46 115/55  Pulse: 75 78  Resp: 16   Height: 5\' 11"  (1.803 m)   Weight: 195 lb (88.451 kg)   SpO2: 98% 97%   Body mass index is 27.20 kg/(m^2). GENERAL: The patient is a well-nourished male, in no acute distress. The vital signs are documented above. CARDIOVASCULAR: There is a regular rate and rhythm without significant murmur appreciated. He has a right carotid bruit. PULMONARY: There is good air exchange bilaterally without wheezing or rales. ABDOMEN: Soft and non-tender with normal pitched bowel sounds.  MUSCULOSKELETAL: There are no major deformities or cyanosis. NEUROLOGIC: No focal weakness or paresthesias are detected. SKIN: There are no ulcers or rashes noted. PSYCHIATRIC: The patient has a normal affect.  DATA:  Lab Results  Component Value Date   WBC 7.0 04/10/2011   HGB 11.3* 04/10/2011   HCT 33.8* 04/10/2011   MCV 93.4 04/10/2011   PLT 162 04/10/2011   Lab Results  Component Value Date   NA 138 04/10/2011   K 3.9 04/10/2011   CL 104 04/10/2011   CO2 25 04/10/2011   Lab Results  Component Value Date   CREATININE 1.01 04/10/2011   Lab Results  Component Value Date   INR 0.98 04/07/2011   INR 0.97 02/13/2011   Lab Results  Component Value Date   HGBA1C 6.1* 04/09/2011   CBG (last 3)  No results found for this basename: GLUCAP:3 in the last 72  hours  Carotid duplex of the right side today shows  that the right carotid artery still is patent with very high velocities. End diastolic velocity is 247 cm/s.  MEDICAL ISSUES:  ASYMPTOMATIC CAROTID ARTERY DISEASE: The patient has done well after left carotid endarterectomy. At this point I think it is safe to proceed with right carotid endarterectomy. We'll have him stop his Plavix one-week preoperatively. I have reviewed the indications for carotid endarterectomy, that is to lower the risk of future stroke. I have also reviewed the potential complications of surgery, including but not limited to: bleeding, stroke (perioperative risk 1-2%), MI, nerve injury of other unpredictable medical problems. All of the patients questions were answered and they are agreeable to proceed with surgery. His surgery scheduled is scheduled for 05/08/2011. He does know to continue taking his aspirin.  Suesan Mohrmann S [facility] [place] Beeper: [phone]

## 2011-04-30 ENCOUNTER — Encounter (HOSPITAL_COMMUNITY): Payer: Self-pay | Admitting: Pharmacy Technician

## 2011-05-04 NOTE — Pre-Procedure Instructions (Addendum)
20 [name]  05/04/2011   Your procedure is scheduled on:     Friday  05/08/11    Report to [facility] at 530 AM.  Call this number if you have problems the morning of surgery: [phone]   Remember:   Do not eat food:After Midnight.  May have clear liquids: up to 4 Hours before arrival.  Clear liquids include soda, tea, black coffee, apple or grape juice, broth.  Take these medicines the morning of surgery with A SIP OF WATER:    Lopressor(metoprolol), flomax    Do not wear jewelry, make-up or nail polish.  Do not wear lotions, powders, or perfumes. You may wear deodorant.  Do not shave 48 hours prior to surgery.  Do not bring valuables to the hospital.  Contacts, dentures or bridgework may not be worn into surgery.  Leave suitcase in the car. After surgery it may be brought to your room.  For patients admitted to the hospital, checkout time is 11:00 AM the day of discharge.   Patients discharged the day of surgery will not be allowed to drive home.  Name and phone number of your driver:   Special Instructions: CHG Shower Use Special Wash: 1/2 bottle night before surgery and 1/2 bottle morning of surgery.   Please read over the following fact sheets that you were given: Pain Booklet, MRSA Information and Surgical Site Infection Prevention

## 2011-05-05 ENCOUNTER — Encounter (HOSPITAL_COMMUNITY): Payer: Self-pay

## 2011-05-05 ENCOUNTER — Encounter (HOSPITAL_COMMUNITY)
Admission: RE | Admit: 2011-05-05 | Discharge: 2011-05-05 | Disposition: A | Discharge status: Home / Self Care | Payer: Medicare Other | Source: Ambulatory Visit | Attending: Vascular Surgery | Admitting: Vascular Surgery

## 2011-05-05 LAB — DIFFERENTIAL
Eosinophils Absolute: 0.2 10*3/uL (ref 0.0–0.7)
Lymphocytes Relative: 33 % (ref 12–46)
Lymphs Abs: 1.5 10*3/uL (ref 0.7–4.0)
Monocytes Relative: 9 % (ref 3–12)
Neutro Abs: 2.5 10*3/uL (ref 1.7–7.7)
Neutrophils Relative %: 53 % (ref 43–77)

## 2011-05-05 LAB — URINALYSIS, ROUTINE W REFLEX MICROSCOPIC
Bilirubin Urine: NEGATIVE
Ketones, ur: NEGATIVE mg/dL
Nitrite: NEGATIVE
Protein, ur: NEGATIVE mg/dL
pH: 7 (ref 5.0–8.0)

## 2011-05-05 LAB — COMPREHENSIVE METABOLIC PANEL
Albumin: 3.9 g/dL (ref 3.5–5.2)
Alkaline Phosphatase: 51 U/L (ref 39–117)
BUN: 13 mg/dL (ref 6–23)
Calcium: 9.4 mg/dL (ref 8.4–10.5)
Creatinine, Ser: 1.05 mg/dL (ref 0.50–1.35)
GFR calc Af Amer: 77 mL/min — ABNORMAL LOW (ref >90)
Glucose, Bld: 84 mg/dL (ref 70–99)
Potassium: 4.4 mEq/L (ref 3.5–5.1)
Total Protein: 7.5 g/dL (ref 6.0–8.3)

## 2011-05-05 LAB — CBC
HCT: 39.6 % (ref 39.0–52.0)
Hemoglobin: 12.9 g/dL — ABNORMAL LOW (ref 13.0–17.0)
MCH: 30.3 pg (ref 26.0–34.0)
MCHC: 32.6 g/dL (ref 30.0–36.0)
RDW: 14.1 % (ref 11.5–15.5)

## 2011-05-05 LAB — PROTIME-INR
INR: 0.98 (ref 0.00–1.49)
Prothrombin Time: 13.2 seconds (ref 11.6–15.2)

## 2011-05-05 LAB — TYPE AND SCREEN: ABO/RH(D): A NEG

## 2011-05-05 LAB — APTT: aPTT: 34 seconds (ref 24–37)

## 2011-05-05 MED ORDER — DEXTROSE 5 % IV SOLN: 1.5000 g | Freq: Once | INTRAVENOUS | Status: DC

## 2011-05-05 NOTE — Progress Notes (Signed)
Stress test and echo in epic.  Dr Claudie Fisherman is cardiac doc

## 2011-05-06 NOTE — Procedures (Unsigned)
CAROTID DUPLEX EXAM  INDICATION:  Right carotid stenosis.  HISTORY: Diabetes:  Yes. Cardiac:  CABG. Hypertension:  Yes. Smoking:  No. Previous Surgery:  Left carotid endarterectomy April 09, 2011. CV History:  Currently asymptomatic. Amaurosis Fugax No, Paresthesias No, Hemiparesis No                                      RIGHT             LEFT Brachial systolic pressure: Brachial Doppler waveforms: Vertebral direction of flow:        Antegrade DUPLEX VELOCITIES (cm/sec) CCA peak systolic                   75 ECA peak systolic                   132 ICA peak systolic                   511 ICA end diastolic                   247 PLAQUE MORPHOLOGY:                  Mixed PLAQUE AMOUNT:                      Severe PLAQUE LOCATION:                    ICA, bifurcation, ECA  IMPRESSION: 1. Right internal carotid artery velocities suggest 80-99% stenosis. 2. Right external carotid artery stenosis. 3. Antegrade right vertebral artery. 4. Essentially unchanged compared to the previous exam performed     01/26/2011.  ___________________________________________ Di Kindle. Edilia Bo, M.D.  EM/MEDQ  D:  04/29/2011  T:  04/29/2011  Job:  [id number]

## 2011-05-07 MED ORDER — DEXTROSE 5 % IV SOLN
1.5000 g | Freq: Once | INTRAVENOUS | Status: AC
  Administered 2011-05-08: 1.5 g via INTRAVENOUS
  Filled 2011-05-07: qty 1.5

## 2011-05-07 NOTE — Consult Note (Signed)
Anesthesia:  Patient is a 78 year old male scheduled for right CEA on 05/08/11.  History includes DM2, HLD, PNA, urosepsis, CVA, SOB, anemia, PVD, GERD, HTN, former smoker, gout, dizziness, CAD s/p CABG, prior left CEA on 04/09/11.  Primary Cardiologist is Dr. Antoine Poche.  He cleared him prior to his left CEA after having a Lexiscan stress test on 03/20/11 with results showing probably normal stress nuclear study with a small fixed inferoseptal defect suggestive of thinning; no ischemia.  EF 46%.  It was felt to be a low risk scan.  Echo on 07/15/07 showed: - Overall left ventricular systolic function was normal. Left ventricular ejection fraction was estimated , range being 65 % to 75 %.  EKGs from 02/13/11 (SR with incomplete left BBB) and 04/15/11 (sinus arrhythmia with possible septal infarct) noted.  CXR from 02/13/11 shows no acute disease.  Preoperative labs acceptable.  Plan to proceed.

## 2011-05-08 ENCOUNTER — Ambulatory Visit (HOSPITAL_COMMUNITY): Payer: Medicare Other | Admitting: *Deleted

## 2011-05-08 ENCOUNTER — Encounter (HOSPITAL_COMMUNITY): Payer: Self-pay | Admitting: *Deleted

## 2011-05-08 ENCOUNTER — Inpatient Hospital Stay (HOSPITAL_COMMUNITY)
Admission: RE | Admit: 2011-05-08 | Discharge: 2011-05-09 | DRG: 039 | Disposition: A | Discharge status: Home / Self Care | Payer: Medicare Other | Source: Ambulatory Visit | Attending: Vascular Surgery | Admitting: Vascular Surgery

## 2011-05-08 ENCOUNTER — Encounter (HOSPITAL_COMMUNITY)
Admission: RE | Disposition: A | Discharge status: Home / Self Care | Payer: Self-pay | Source: Ambulatory Visit | Attending: Vascular Surgery

## 2011-05-08 DIAGNOSIS — H9190 Unspecified hearing loss, unspecified ear: Secondary | ICD-10-CM | POA: Diagnosis present

## 2011-05-08 DIAGNOSIS — M1990 Unspecified osteoarthritis, unspecified site: Secondary | ICD-10-CM | POA: Diagnosis present

## 2011-05-08 DIAGNOSIS — Z7982 Long term (current) use of aspirin: Secondary | ICD-10-CM

## 2011-05-08 DIAGNOSIS — Z951 Presence of aortocoronary bypass graft: Secondary | ICD-10-CM

## 2011-05-08 DIAGNOSIS — I69998 Other sequelae following unspecified cerebrovascular disease: Secondary | ICD-10-CM

## 2011-05-08 DIAGNOSIS — Z87440 Personal history of urinary (tract) infections: Secondary | ICD-10-CM

## 2011-05-08 DIAGNOSIS — R350 Frequency of micturition: Secondary | ICD-10-CM | POA: Diagnosis present

## 2011-05-08 DIAGNOSIS — E119 Type 2 diabetes mellitus without complications: Secondary | ICD-10-CM | POA: Diagnosis present

## 2011-05-08 DIAGNOSIS — I739 Peripheral vascular disease, unspecified: Secondary | ICD-10-CM | POA: Diagnosis present

## 2011-05-08 DIAGNOSIS — Z7902 Long term (current) use of antithrombotics/antiplatelets: Secondary | ICD-10-CM

## 2011-05-08 DIAGNOSIS — I10 Essential (primary) hypertension: Secondary | ICD-10-CM | POA: Diagnosis present

## 2011-05-08 DIAGNOSIS — K219 Gastro-esophageal reflux disease without esophagitis: Secondary | ICD-10-CM | POA: Diagnosis present

## 2011-05-08 DIAGNOSIS — E785 Hyperlipidemia, unspecified: Secondary | ICD-10-CM | POA: Diagnosis present

## 2011-05-08 DIAGNOSIS — I2510 Atherosclerotic heart disease of native coronary artery without angina pectoris: Secondary | ICD-10-CM | POA: Diagnosis present

## 2011-05-08 DIAGNOSIS — R29898 Other symptoms and signs involving the musculoskeletal system: Secondary | ICD-10-CM | POA: Diagnosis present

## 2011-05-08 DIAGNOSIS — I6529 Occlusion and stenosis of unspecified carotid artery: Principal | ICD-10-CM | POA: Diagnosis present

## 2011-05-08 DIAGNOSIS — Z888 Allergy status to other drugs, medicaments and biological substances status: Secondary | ICD-10-CM

## 2011-05-08 DIAGNOSIS — Z87891 Personal history of nicotine dependence: Secondary | ICD-10-CM

## 2011-05-08 DIAGNOSIS — Z79899 Other long term (current) drug therapy: Secondary | ICD-10-CM

## 2011-05-08 HISTORY — PX: ENDARTERECTOMY: SHX5162

## 2011-05-08 LAB — GLUCOSE, CAPILLARY
Glucose-Capillary: 115 mg/dL — ABNORMAL HIGH (ref 70–99)
Glucose-Capillary: 90 mg/dL (ref 70–99)
Glucose-Capillary: 94 mg/dL (ref 70–99)

## 2011-05-08 SURGERY — ENDARTERECTOMY, CAROTID
Anesthesia: General | Site: Neck | Laterality: Right | Wound class: Clean

## 2011-05-08 MED ORDER — SODIUM CHLORIDE 0.9 % IV SOLN
INTRAVENOUS | Status: DC
  Administered 2011-05-08: 100 mL via INTRAVENOUS
  Administered 2011-05-08: 15:00:00 via INTRAVENOUS

## 2011-05-08 MED ORDER — INSULIN ASPART 100 UNIT/ML ~~LOC~~ SOLN
0.0000 [IU] | Freq: Three times a day (TID) | SUBCUTANEOUS | Status: DC
  Filled 2011-05-08: qty 3

## 2011-05-08 MED ORDER — OXYCODONE HCL 5 MG PO TABS: 5.0000 mg | ORAL_TABLET | ORAL | Status: DC | PRN

## 2011-05-08 MED ORDER — DEXTROSE 5 % IV SOLN
1.5000 g | Freq: Two times a day (BID) | INTRAVENOUS | Status: AC
  Administered 2011-05-08 – 2011-05-09 (×2): 1.5 g via INTRAVENOUS
  Filled 2011-05-08 (×2): qty 1.5

## 2011-05-08 MED ORDER — DOPAMINE-DEXTROSE 3.2-5 MG/ML-% IV SOLN: 3.0000 ug/kg/min | INTRAVENOUS | Status: DC

## 2011-05-08 MED ORDER — PHENOL 1.4 % MT LIQD: 1.0000 | OROMUCOSAL | Status: DC | PRN

## 2011-05-08 MED ORDER — ONDANSETRON HCL 4 MG/2ML IJ SOLN: 4.0000 mg | Freq: Once | INTRAMUSCULAR | Status: DC | PRN

## 2011-05-08 MED ORDER — SIMVASTATIN 20 MG PO TABS
20.0000 mg | ORAL_TABLET | Freq: Every day | ORAL | Status: DC
  Administered 2011-05-08: 20 mg via ORAL
  Filled 2011-05-08 (×2): qty 1

## 2011-05-08 MED ORDER — LABETALOL HCL 5 MG/ML IV SOLN: 10.0000 mg | INTRAVENOUS | Status: DC | PRN

## 2011-05-08 MED ORDER — TAMSULOSIN HCL 0.4 MG PO CAPS
0.4000 mg | ORAL_CAPSULE | Freq: Every day | ORAL | Status: DC
  Administered 2011-05-08 – 2011-05-09 (×2): 0.4 mg via ORAL
  Filled 2011-05-08 (×2): qty 1

## 2011-05-08 MED ORDER — ACETAMINOPHEN 325 MG PO TABS: 325.0000 mg | ORAL_TABLET | ORAL | Status: DC | PRN

## 2011-05-08 MED ORDER — GLYCOPYRROLATE 0.2 MG/ML IJ SOLN
INTRAMUSCULAR | Status: DC | PRN
  Administered 2011-05-08: 0.2 mg via INTRAVENOUS

## 2011-05-08 MED ORDER — LIDOCAINE-EPINEPHRINE (PF) 1 %-1:200000 IJ SOLN
INTRAMUSCULAR | Status: DC | PRN
  Administered 2011-05-08: 10 mL

## 2011-05-08 MED ORDER — HYDRALAZINE HCL 20 MG/ML IJ SOLN: 10.0000 mg | INTRAMUSCULAR | Status: DC | PRN

## 2011-05-08 MED ORDER — NIACIN ER (ANTIHYPERLIPIDEMIC) 500 MG PO TBCR
1000.0000 mg | EXTENDED_RELEASE_TABLET | Freq: Every day | ORAL | Status: DC
  Filled 2011-05-08 (×2): qty 2

## 2011-05-08 MED ORDER — GLYBURIDE 2.5 MG PO TABS
2.5000 mg | ORAL_TABLET | Freq: Two times a day (BID) | ORAL | Status: DC
  Administered 2011-05-08 – 2011-05-09 (×2): 2.5 mg via ORAL
  Filled 2011-05-08 (×4): qty 1

## 2011-05-08 MED ORDER — POTASSIUM CHLORIDE CRYS ER 20 MEQ PO TBCR: 20.0000 meq | EXTENDED_RELEASE_TABLET | Freq: Once | ORAL | Status: AC | PRN

## 2011-05-08 MED ORDER — RAMIPRIL 2.5 MG PO CAPS
2.5000 mg | ORAL_CAPSULE | Freq: Every day | ORAL | Status: DC
  Administered 2011-05-08 – 2011-05-09 (×2): 2.5 mg via ORAL
  Filled 2011-05-08 (×2): qty 1

## 2011-05-08 MED ORDER — FAMOTIDINE IN NACL 20-0.9 MG/50ML-% IV SOLN
20.0000 mg | Freq: Two times a day (BID) | INTRAVENOUS | Status: DC
  Administered 2011-05-08 (×2): 20 mg via INTRAVENOUS
  Filled 2011-05-08 (×4): qty 50

## 2011-05-08 MED ORDER — DOCUSATE SODIUM 100 MG PO CAPS
100.0000 mg | ORAL_CAPSULE | Freq: Every day | ORAL | Status: DC
  Administered 2011-05-09: 100 mg via ORAL
  Filled 2011-05-08: qty 1

## 2011-05-08 MED ORDER — SODIUM CHLORIDE 0.9 % IV SOLN
10.0000 mg | INTRAVENOUS | Status: DC | PRN
  Administered 2011-05-08: 50 ug/min via INTRAVENOUS

## 2011-05-08 MED ORDER — CLOPIDOGREL BISULFATE 75 MG PO TABS
75.0000 mg | ORAL_TABLET | Freq: Every day | ORAL | Status: DC
  Administered 2011-05-09: 75 mg via ORAL
  Filled 2011-05-08: qty 1

## 2011-05-08 MED ORDER — MIDAZOLAM HCL 5 MG/5ML IJ SOLN
INTRAMUSCULAR | Status: DC | PRN
  Administered 2011-05-08: 1 mg via INTRAVENOUS

## 2011-05-08 MED ORDER — ACETAMINOPHEN 650 MG RE SUPP: 325.0000 mg | RECTAL | Status: DC | PRN

## 2011-05-08 MED ORDER — DEXTRAN 40 IN SALINE 10-0.9 % IV SOLN
25.0000 mL/h | INTRAVENOUS | Status: DC
  Filled 2011-05-08: qty 500

## 2011-05-08 MED ORDER — MAGNESIUM SULFATE 40 MG/ML IJ SOLN
2.0000 g | Freq: Once | INTRAMUSCULAR | Status: AC | PRN
  Filled 2011-05-08: qty 50

## 2011-05-08 MED ORDER — RAMIPRIL 2.5 MG PO TABS: 2.5000 mg | ORAL_TABLET | Freq: Every day | ORAL | Status: DC

## 2011-05-08 MED ORDER — EPHEDRINE SULFATE 50 MG/ML IJ SOLN
INTRAMUSCULAR | Status: DC | PRN
  Administered 2011-05-08: 10 mg via INTRAVENOUS

## 2011-05-08 MED ORDER — METOPROLOL TARTRATE 1 MG/ML IV SOLN: 2.0000 mg | INTRAVENOUS | Status: DC | PRN

## 2011-05-08 MED ORDER — SENNOSIDES-DOCUSATE SODIUM 8.6-50 MG PO TABS
1.0000 | ORAL_TABLET | Freq: Every evening | ORAL | Status: DC | PRN
  Filled 2011-05-08: qty 1

## 2011-05-08 MED ORDER — SODIUM CHLORIDE 0.9 % IV SOLN
INTRAVENOUS | Status: DC | PRN
  Administered 2011-05-08: 09:00:00 via INTRAVENOUS

## 2011-05-08 MED ORDER — VECURONIUM BROMIDE 10 MG IV SOLR
INTRAVENOUS | Status: DC | PRN
  Administered 2011-05-08: 10 mg via INTRAVENOUS

## 2011-05-08 MED ORDER — DEXTRAN 40 IN SALINE 10-0.9 % IV SOLN
INTRAVENOUS | Status: DC | PRN
  Administered 2011-05-08: 500 mL

## 2011-05-08 MED ORDER — HYDROMORPHONE HCL PF 1 MG/ML IJ SOLN: 0.2500 mg | INTRAMUSCULAR | Status: DC | PRN

## 2011-05-08 MED ORDER — GUAIFENESIN-DM 100-10 MG/5ML PO SYRP: 15.0000 mL | ORAL_SOLUTION | ORAL | Status: DC | PRN

## 2011-05-08 MED ORDER — NIACIN ER (ANTIHYPERLIPIDEMIC) 1000 MG PO TBCR: 1000.0000 mg | EXTENDED_RELEASE_TABLET | Freq: Every day | ORAL | Status: DC

## 2011-05-08 MED ORDER — ONDANSETRON HCL 4 MG/2ML IJ SOLN: 4.0000 mg | Freq: Four times a day (QID) | INTRAMUSCULAR | Status: DC | PRN

## 2011-05-08 MED ORDER — HEPARIN SODIUM (PORCINE) 1000 UNIT/ML IJ SOLN
INTRAMUSCULAR | Status: DC | PRN
  Administered 2011-05-08: 8 mL via INTRAVENOUS

## 2011-05-08 MED ORDER — 0.9 % SODIUM CHLORIDE (POUR BTL) OPTIME
TOPICAL | Status: DC | PRN
  Administered 2011-05-08: 1000 mL

## 2011-05-08 MED ORDER — LACTATED RINGERS IV SOLN
INTRAVENOUS | Status: DC | PRN
  Administered 2011-05-08: 07:00:00 via INTRAVENOUS

## 2011-05-08 MED ORDER — FENTANYL CITRATE 0.05 MG/ML IJ SOLN
INTRAMUSCULAR | Status: DC | PRN
  Administered 2011-05-08: 200 ug via INTRAVENOUS

## 2011-05-08 MED ORDER — SODIUM CHLORIDE 0.9 % IR SOLN
Status: DC | PRN
  Administered 2011-05-08: 09:00:00

## 2011-05-08 MED ORDER — ASPIRIN EC 325 MG PO TBEC
325.0000 mg | DELAYED_RELEASE_TABLET | Freq: Every day | ORAL | Status: DC
  Administered 2011-05-09: 325 mg via ORAL
  Filled 2011-05-08: qty 1

## 2011-05-08 MED ORDER — SODIUM CHLORIDE 0.9 % IV SOLN: INTRAVENOUS | Status: DC

## 2011-05-08 MED ORDER — SODIUM CHLORIDE 0.9 % IV SOLN: 500.0000 mL | Freq: Once | INTRAVENOUS | Status: AC | PRN

## 2011-05-08 MED ORDER — ASPIRIN EC 325 MG PO TBEC: 325.0000 mg | DELAYED_RELEASE_TABLET | Freq: Every day | ORAL | Status: DC

## 2011-05-08 MED ORDER — METOPROLOL TARTRATE 25 MG PO TABS
25.0000 mg | ORAL_TABLET | Freq: Two times a day (BID) | ORAL | Status: DC
  Administered 2011-05-08 – 2011-05-09 (×3): 25 mg via ORAL
  Filled 2011-05-08 (×4): qty 1

## 2011-05-08 MED ORDER — PROTAMINE SULFATE 10 MG/ML IV SOLN
INTRAVENOUS | Status: DC | PRN
  Administered 2011-05-08: 10 mg via INTRAVENOUS
  Administered 2011-05-08: 30 mg via INTRAVENOUS

## 2011-05-08 MED ORDER — MORPHINE SULFATE 2 MG/ML IJ SOLN: 0.0500 mg/kg | INTRAMUSCULAR | Status: DC | PRN

## 2011-05-08 MED ORDER — THROMBIN 20000 UNITS EX KIT
PACK | CUTANEOUS | Status: DC | PRN
  Administered 2011-05-08: 09:00:00 via TOPICAL

## 2011-05-08 MED ORDER — PROPOFOL 10 MG/ML IV BOLUS
INTRAVENOUS | Status: DC | PRN
  Administered 2011-05-08: 100 mg via INTRAVENOUS

## 2011-05-08 MED FILL — Heparin Sodium (Porcine) Inj 1000 Unit/ML: INTRAMUSCULAR | Qty: 10 | Status: AC

## 2011-05-08 SURGICAL SUPPLY — 55 items
ADH SKN CLS APL DERMABOND .7 (GAUZE/BANDAGES/DRESSINGS) ×1
ADH SKN CLS LQ APL DERMABOND (GAUZE/BANDAGES/DRESSINGS) ×1
BAG DECANTER FOR FLEXI CONT (MISCELLANEOUS) ×2 IMPLANT
CANISTER SUCTION 2500CC (MISCELLANEOUS) ×2 IMPLANT
CANNULA VESSEL W/WING WO/VALVE (CANNULA) ×2 IMPLANT
CATH ROBINSON RED A/P 18FR (CATHETERS) ×2 IMPLANT
CLIP TI MEDIUM 24 (CLIP) ×2 IMPLANT
CLIP TI WIDE RED SMALL 24 (CLIP) ×2 IMPLANT
CLOTH BEACON ORANGE TIMEOUT ST (SAFETY) ×2 IMPLANT
COVER SURGICAL LIGHT HANDLE (MISCELLANEOUS) ×4 IMPLANT
CRADLE DONUT ADULT HEAD (MISCELLANEOUS) ×2 IMPLANT
DERMABOND ADHESIVE PROPEN (GAUZE/BANDAGES/DRESSINGS) ×1
DERMABOND ADVANCED (GAUZE/BANDAGES/DRESSINGS) ×1
DERMABOND ADVANCED .7 DNX12 (GAUZE/BANDAGES/DRESSINGS) ×1 IMPLANT
DERMABOND ADVANCED .7 DNX6 (GAUZE/BANDAGES/DRESSINGS) ×1 IMPLANT
DRAIN CHANNEL 15F RND FF W/TCR (WOUND CARE) IMPLANT
DRAPE WARM FLUID 44X44 (DRAPE) ×2 IMPLANT
ELECT REM PT RETURN 9FT ADLT (ELECTROSURGICAL) ×2
ELECTRODE REM PT RTRN 9FT ADLT (ELECTROSURGICAL) ×1 IMPLANT
EVACUATOR SILICONE 100CC (DRAIN) IMPLANT
GLOVE BIO SURGEON STRL SZ7.5 (GLOVE) ×3 IMPLANT
GLOVE BIOGEL PI IND STRL 7.0 (GLOVE) IMPLANT
GLOVE BIOGEL PI IND STRL 7.5 (GLOVE) ×1 IMPLANT
GLOVE BIOGEL PI INDICATOR 7.0 (GLOVE) ×3
GLOVE BIOGEL PI INDICATOR 7.5 (GLOVE) ×4
GLOVE ECLIPSE 7.0 STRL STRAW (GLOVE) ×1 IMPLANT
GLOVE SS BIOGEL STRL SZ 7 (GLOVE) IMPLANT
GLOVE SUPERSENSE BIOGEL SZ 7 (GLOVE) ×1
GLOVE SURG SS PI 7.5 STRL IVOR (GLOVE) ×4 IMPLANT
GOWN PREVENTION PLUS XLARGE (GOWN DISPOSABLE) ×4 IMPLANT
GOWN STRL NON-REIN LRG LVL3 (GOWN DISPOSABLE) ×7 IMPLANT
KIT BASIN OR (CUSTOM PROCEDURE TRAY) ×2 IMPLANT
KIT ROOM TURNOVER OR (KITS) ×2 IMPLANT
NDL HYPO 25X1 1.5 SAFETY (NEEDLE) ×1 IMPLANT
NEEDLE HYPO 25X1 1.5 SAFETY (NEEDLE) ×2 IMPLANT
NS IRRIG 1000ML POUR BTL (IV SOLUTION) ×4 IMPLANT
PACK CAROTID (CUSTOM PROCEDURE TRAY) ×2 IMPLANT
PAD ARMBOARD 7.5X6 YLW CONV (MISCELLANEOUS) ×4 IMPLANT
PATCH HEMASHIELD 8X75 (Vascular Products) ×2 IMPLANT
SHUNT CAROTID BYPASS 10 (VASCULAR PRODUCTS) ×1 IMPLANT
SHUNT CAROTID BYPASS 12FRX15.5 (VASCULAR PRODUCTS) IMPLANT
SPECIMEN JAR SMALL (MISCELLANEOUS) ×2 IMPLANT
SPONGE SURGIFOAM ABS GEL 100 (HEMOSTASIS) IMPLANT
SUT PROLENE 6 0 BV (SUTURE) ×2 IMPLANT
SUT PROLENE 7 0 BV 1 (SUTURE) IMPLANT
SUT SILK 2 0 FS (SUTURE) IMPLANT
SUT VIC AB 3-0 SH 27 (SUTURE) ×2
SUT VIC AB 3-0 SH 27X BRD (SUTURE) ×1 IMPLANT
SUT VICRYL 4-0 PS2 18IN ABS (SUTURE) ×2 IMPLANT
SYR CONTROL 10ML LL (SYRINGE) ×2 IMPLANT
THROMBIN, TOPICAL 20,000UNITS ×2 IMPLANT
TOWEL OR 17X24 6PK STRL BLUE (TOWEL DISPOSABLE) ×2 IMPLANT
TOWEL OR 17X26 10 PK STRL BLUE (TOWEL DISPOSABLE) ×2 IMPLANT
TRAY FOLEY CATH 14FRSI W/METER (CATHETERS) ×2 IMPLANT
WATER STERILE IRR 1000ML POUR (IV SOLUTION) ×2 IMPLANT

## 2011-05-08 NOTE — Preoperative (Signed)
Beta Blockers   Reason not to administer Beta Blockers:Not Applicable 

## 2011-05-08 NOTE — Progress Notes (Signed)
VASCULAR PROGRESS NOTE  SUBJECTIVE: No complaints.  PHYSICAL EXAM: Filed Vitals:   05/08/11 1132 05/08/11 1145 05/08/11 1147 05/08/11 1222  BP: 133/52  125/55 131/61  Pulse: 71 74 73 66  Temp:   97.5 F (36.4 C) 98.5 F (36.9 C)  TempSrc:    Oral  Resp: 17 17 15 24   Height:    5\' 11"  (1.803 m)  Weight:    194 lb 3.6 oz (88.1 kg)  SpO2: 98% 98% 98% 98%   Lungs: clear to auscultation Right neck incision looks fine. Neuro intact  CBG (last 3)   Basename 05/08/11 1007 05/08/11 0616  GLUCAP 160* 115*     ASSESSMENT/PLAN: 1. Doing well status post right carotid endarterectomy. 2. Anticipate discharge tomorrow. 3. May resume Plavix.  Waverly Ferrari, MD, FACS Beeper: [phone] 05/08/2011

## 2011-05-08 NOTE — Anesthesia Postprocedure Evaluation (Signed)
Anesthesia Post Note  Patient: [name]  Procedure(s) Performed: Procedure(s) (LRB): ENDARTERECTOMY CAROTID (Right)  Anesthesia type: general  Patient location: PACU  Post pain: Pain level controlled  Post assessment: Patient's Cardiovascular Status Stable  Last Vitals:  Filed Vitals:   05/08/11 1047  BP: 124/57  Pulse: 71  Temp:   Resp: 17    Post vital signs: Reviewed and stable  Level of consciousness: sedated  Complications: No apparent anesthesia complications

## 2011-05-08 NOTE — Anesthesia Preprocedure Evaluation (Addendum)
Anesthesia Evaluation  Patient identified by MRN, date of birth, ID band Patient awake    Reviewed: Allergy & Precautions, H&P , NPO status , Patient's Chart, lab work & pertinent test results  Airway Mallampati: I TM Distance: >3 FB Neck ROM: Full    Dental   Pulmonary          Cardiovascular hypertension, Pt. on medications + CAD     Neuro/Psych    GI/Hepatic GERD-  Controlled,  Endo/Other  Diabetes mellitus-, Well Controlled, Type 2  Renal/GU      Musculoskeletal   Abdominal   Peds  Hematology   Anesthesia Other Findings   Reproductive/Obstetrics                          Anesthesia Physical Anesthesia Plan  ASA: III  Anesthesia Plan: General   Post-op Pain Management:    Induction: Intravenous  Airway Management Planned: Oral ETT  Additional Equipment:   Intra-op Plan:   Post-operative Plan: Extubation in OR  Informed Consent: I have reviewed the patients History and Physical, chart, labs and discussed the procedure including the risks, benefits and alternatives for the proposed anesthesia with the patient or authorized representative who has indicated his/her understanding and acceptance.     Plan Discussed with: CRNA and Surgeon  Anesthesia Plan Comments:         Anesthesia Quick Evaluation

## 2011-05-08 NOTE — Discharge Summary (Signed)
[facility] Discharge Summary  [name] [birth year] 78 y.o. male  [id number]  Admission Date: 05/08/2011  Discharge Date: 05/09/11  Physician: [name] Hint, MD  Admission Diagnosis: right internal carotid artery stenosis   HPI:   This is a 78 y.o. male who had a history of a right hemispheric stroke associated with left arm weakness and left lower extremity weakness back in 2009. At that time he had 40-59% carotid stenoses. I not seen in many years and was sent back on 02/11/2011 after a carotid duplex scan done at the [facility] office and showed evidence of bilateral severe carotid disease. Since 2009, he had no further history of stroke, TIAs, expressive or receptive aphasia, or amaurosis fugax. I recommended staged bilateral carotid endarterectomies. He underwent preoperative cardiac clearance by Dr. Rollene [name].  On 04/09/2011 he underwent left carotid endarterectomy with Dacron patch angioplasty and did very well. He was discharged on postoperative day #1. He was seen in follow up today in his had no history of stroke, TIAs, expressive or receptive aphasia, or amaurosis fugax. He is now ready to schedule right carotid endarterectomy. He has been back on his Plavix.   Hospital Course:  The patient was admitted to the hospital and taken to the operating room on 05/08/2011 and underwent right carotid endarterectomy.  The pt tolerated the procedure well and was transported to the PACU in good condition.   By POD 1, the pt neuro status in tact.    The remainder of the hospital course consisted of increasing ambulation and increasing intake of solids without difficulty.  LABS  CBC    Component Value Date/Time   WBC 4.7 05/05/2011 0936   RBC 4.26 05/05/2011 0936   HGB 12.9* 05/05/2011 0936   HCT 39.6 05/05/2011 0936   PLT 180 05/05/2011 0936   MCV 93.0 05/05/2011 0936   MCH 30.3 05/05/2011 0936   MCHC 32.6 05/05/2011 0936   RDW 14.1 05/05/2011 0936   LYMPHSABS  1.5 05/05/2011 0936   MONOABS 0.4 05/05/2011 0936   EOSABS 0.2 05/05/2011 0936   BASOSABS 0.0 05/05/2011 0936    BMET    Component Value Date/Time   NA 135 05/05/2011 0936   K 4.4 05/05/2011 0936   CL 99 05/05/2011 0936   CO2 29 05/05/2011 0936   GLUCOSE 84 05/05/2011 0936   BUN 13 05/05/2011 0936   CREATININE 1.05 05/05/2011 0936   CALCIUM 9.4 05/05/2011 0936   GFRNONAA 66* 05/05/2011 0936   GFRAA 77* 05/05/2011 0936      Discharge Instructions:   The patient is discharged to home with extensive instructions on wound care and progressive ambulation.  They are instructed not to drive or perform any heavy lifting until returning to see the physician in his office.  Discharge Orders    Future Appointments: Provider: Department: Dept Phone: Center:   05/20/2011 3:15 PM [name] Hint, MD [facility] [phone] [facility]     Future Orders Please Complete By Expires   Resume previous diet      Driving Restrictions      Comments:   No driving for 2 weeks and while taking pain medication   Lifting restrictions      Comments:   No lifting for 6 weeks   Call MD for:  temperature >100.5      Call MD for:  redness, tenderness, or signs of infection (pain, swelling, bleeding, redness, odor or green/yellow discharge around incision site)      Call  MD for:  severe or increased pain, loss or decreased feeling  in affected limb(s)      may wash over wound with mild soap and water      Scheduling Instructions:   Shower daily with soap and water starting 05/10/11    CAROTID Sugery: Call MD for difficulty swallowing or speaking; weakness in arms or legs that is a new symtom; severe headache.  If you have increased swelling in the neck and/or  are having difficulty breathing, CALL 911         Discharge Diagnosis:  right internal carotid artery stenosis  Secondary Diagnosis: Patient Active Problem List  Diagnoses  . Occlusion and stenosis of carotid artery without mention of cerebral infarction  . Preop  cardiovascular exam  . Diabetes mellitus type 2  . CAD (coronary artery disease) of artery bypass graft  . Hyperlipidemia  . Hypertension  . Peripheral vascular disease   Past Medical History  Diagnosis Date  . Diabetes mellitus type 2   . GERD (gastroesophageal reflux disease)   . Hyperlipidemia     takes Niacin nightly  . DJD (degenerative joint disease)   . Urosepsis   . Hypertension     takes Metoprolol and Ramipril daily  . Pneumonia     hx of 1959  . Dizziness   . CVA (cerebral vascular accident) 4+yrs ago    right arm/leg occ hurts  . Gout     hx of--47yrs ago  . Itching     on right leg  . Bruises easily     pt is on Plavix and ASA  . Shortness of breath     wakes pt up during night with occ shortness of breathe;states it happens about once a month  . Impaired hearing   . Urinary frequency   . UTI (lower urinary tract infection)     hx of--03/2010  . Anemia     takes Glyburide bid  . CAD (coronary artery disease) of artery bypass graft   . Peripheral vascular disease   . S/P carotid endarterectomy      [name], [name]  Home Medication Instructions [id number]   Printed on:05/08/11 1357  Medication Information                    glyBURIDE (DIABETA) 2.5 MG tablet Take 2.5 mg by mouth 2 (two) times daily with a meal.            ramipril (ALTACE) 2.5 MG tablet Take 2.5 mg by mouth daily.            simvastatin (ZOCOR) 20 MG tablet Take 20 mg by mouth at bedtime.            niacin (NIASPAN) 1000 MG CR tablet Take 1,000 mg by mouth at bedtime.            fish oil-omega-3 fatty acids 1000 MG capsule Take 1 g by mouth daily.            Flaxseed, Linseed, (FLAX SEED OIL PO) Take 1,300 mg by mouth daily.            metoprolol (LOPRESSOR) 50 MG tablet Take 25 mg by mouth 2 (two) times daily.            clopidogrel (PLAVIX) 75 MG tablet Take 75 mg by mouth daily.            aspirin EC 325 MG tablet Take 325 mg by mouth daily.  Tamsulosin HCl (FLOMAX) 0.4 MG CAPS Take 0.4 mg by mouth daily.           Multiple Vitamin (MULITIVITAMIN WITH MINERALS) TABS Take 1 tablet by mouth daily.           oxyCODONE (OXY IR/ROXICODONE) 5 MG immediate release tablet Take 1 tablet (5 mg total) by mouth every 4 (four) hours as needed. #30 NR            Disposition: home  Patient's condition: is Good  Follow up: 1. Dr.  Edilia Bo in 2 weeks.   Newton Pigg, PA-C [facility] [phone] 05/08/2011  1:57 PM

## 2011-05-08 NOTE — Anesthesia Procedure Notes (Signed)
Procedure Name: Intubation Date/Time: 05/08/2011 7:46 AM Performed by: Rosita Fire Pre-anesthesia Checklist: Patient identified, Emergency Drugs available, Suction available and Patient being monitored Patient Re-evaluated:Patient Re-evaluated prior to inductionOxygen Delivery Method: Circle system utilized Preoxygenation: Pre-oxygenation with 100% oxygen Intubation Type: IV induction Ventilation: Mask ventilation without difficulty Grade View: Grade I Tube type: Oral Tube size: 7.5 mm Number of attempts: 1 Placement Confirmation: ETT inserted through vocal cords under direct vision,  positive ETCO2 and breath sounds checked- equal and bilateral Secured at: 23 cm Tube secured with: Tape Dental Injury: Teeth and Oropharynx as per pre-operative assessment

## 2011-05-08 NOTE — Addendum Note (Signed)
Addendum  created 05/08/11 1215 by Skylan Lara S Dayanna Pryce, CRNA   Modules edited:Anesthesia Medication Administration    

## 2011-05-08 NOTE — Progress Notes (Addendum)
Pt tx from PACU, pt VSS

## 2011-05-08 NOTE — OR Nursing (Signed)
On assessment in holding area, patient had strong, equal bilateral hand grips, equal bilateral foot movement and tongue was straight and midline on extension.

## 2011-05-08 NOTE — H&P (View-Only) (Signed)
[facility] [place]  Patient name: [name] MRN: [id number] DOB: 12/31/1931 Sex: male HISTORY AND PHYSICAL EXAM:  CC: greater than 80% right carotid stenosis   HPI: [name] is a 78 y.o. male who had a history of a right hemispheric stroke associated with left arm weakness and left lower extremity weakness back in 2009. At that time he had 40-59% carotid stenoses. I not seen in many years and was sent back on 02/11/2011 after a carotid duplex scan done at the [place] office and showed evidence of bilateral severe carotid disease. Since 2009, he had no further history of stroke, TIAs, expressive or receptive aphasia, or amaurosis fugax. I recommended staged bilateral carotid endarterectomies. He underwent preoperative cardiac clearance by Dr. James Hochrein.  On 04/09/2011 he underwent left carotid endarterectomy with Dacron patch angioplasty and did very well. He was discharged on postoperative day #1. He was seen in follow up today in his had no history of stroke, TIAs, expressive or receptive aphasia, or amaurosis fugax. He is now ready to schedule right carotid endarterectomy. He has been back on his Plavix.  Past Medical History  Diagnosis Date  . Diabetes mellitus type 2   . CAD (coronary artery disease) of artery bypass graft   . GERD (gastroesophageal reflux disease)   . Hyperlipidemia     takes Niacin nightly  . DJD (degenerative joint disease)   . Urosepsis   . Peripheral vascular disease   . Hypertension     takes Metoprolol and Ramipril daily  . Pneumonia     hx of 1959  . Dizziness   . CVA (cerebral vascular accident) 4+yrs ago    right arm/leg occ hurts  . Gout     hx of--4yrs ago  . Itching     on right leg  . Bruises easily     pt is on Plavix and ASA  . Shortness of breath     wakes pt up during night with occ shortness of breathe;states it happens about once a month  . Impaired hearing   . Urinary frequency   . UTI  (lower urinary tract infection)     hx of--03/2010  . Anemia     takes Glyburide bid    Family History  Problem Relation Age of Onset  . Diabetes Sister   . Cancer Brother   . Diabetes Brother   . Anesthesia problems Neg Hx   . Hypotension Neg Hx   . Malignant hyperthermia Neg Hx   . Pseudochol deficiency Neg Hx     SOCIAL HISTORY: History  Substance Use Topics  . Smoking status: Former Smoker    Types: Cigarettes  . Smokeless tobacco: Not on file   Comment: quit 40yrs ago  . Alcohol Use: Yes     quit in 1981    Allergies  Allergen Reactions  . Afrin Nasal Spray Other (See Comments)    "goes berserk"   . Allegra Other (See Comments)    "loses it"  . Flonase (Fluticasone Propionate) Other (See Comments)    "goes berserk"    Current Outpatient Prescriptions  Medication Sig Dispense Refill  . aspirin EC 325 MG tablet Take 325 mg by mouth daily.       . clopidogrel (PLAVIX) 75 MG tablet Take 75 mg by mouth daily.       . fish oil-omega-3 fatty acids 1000 MG capsule Take 1 g by mouth daily.       .   Flaxseed, Linseed, (FLAX SEED OIL PO) Take 1,300 mg by mouth daily.       . glyBURIDE (DIABETA) 2.5 MG tablet Take 2.5 mg by mouth 2 (two) times daily with a meal.       . metoprolol (LOPRESSOR) 50 MG tablet Take 25 mg by mouth 2 (two) times daily.       . Multiple Vitamin (MULTIVITAMIN) tablet Take 1 tablet by mouth daily.       . niacin (NIASPAN) 1000 MG CR tablet Take 1,000 mg by mouth at bedtime.       . ramipril (ALTACE) 2.5 MG tablet Take 2.5 mg by mouth daily.       . simvastatin (ZOCOR) 20 MG tablet Take 20 mg by mouth at bedtime.       . Tamsulosin HCl (FLOMAX) 0.4 MG CAPS Take 0.4 mg by mouth daily.        REVIEW OF SYSTEMS: [X ] denotes positive finding; [  ] denotes negative finding  CARDIOVASCULAR:  [ ] chest pain   [ ] chest pressure   [ ] palpitations   [ ] orthopnea   [ ] dyspnea on exertion   [ ] claudication   [ ] rest pain   [ ] DVT   [ ]  phlebitis PULMONARY:   [ ] productive cough   [ ] asthma   [ ] wheezing NEUROLOGIC:   [ ] weakness  [ ] paresthesias  [ ] aphasia  [ ] amaurosis  [ ] dizziness HEMATOLOGIC:   [ ] bleeding problems   [ ] clotting disorders MUSCULOSKELETAL:  [ ] joint pain   [ ] joint swelling [ ] leg swelling GASTROINTESTINAL: [ ]  blood in stool  [ ]  hematemesis GENITOURINARY:  [ ]  dysuria  [ ]  hematuria PSYCHIATRIC:  [ ] history of major depression INTEGUMENTARY:  [ ] rashes  [ ] ulcers CONSTITUTIONAL:  [ ] fever   [ ] chills  PHYSICAL EXAM: Filed Vitals:   04/29/11 1138 04/29/11 1139  BP: 105/46 115/55  Pulse: 75 78  Resp: 16   Height: 5' 11" (1.803 m)   Weight: 195 lb (88.451 kg)   SpO2: 98% 97%   Body mass index is 27.20 kg/(m^2). GENERAL: The patient is a well-nourished male, in no acute distress. The vital signs are documented above. CARDIOVASCULAR: There is a regular rate and rhythm without significant murmur appreciated. He has a right carotid bruit. PULMONARY: There is good air exchange bilaterally without wheezing or rales. ABDOMEN: Soft and non-tender with normal pitched bowel sounds.  MUSCULOSKELETAL: There are no major deformities or cyanosis. NEUROLOGIC: No focal weakness or paresthesias are detected. SKIN: There are no ulcers or rashes noted. PSYCHIATRIC: The patient has a normal affect.  DATA:  Lab Results  Component Value Date   WBC 7.0 04/10/2011   HGB 11.3* 04/10/2011   HCT 33.8* 04/10/2011   MCV 93.4 04/10/2011   PLT 162 04/10/2011   Lab Results  Component Value Date   NA 138 04/10/2011   K 3.9 04/10/2011   CL 104 04/10/2011   CO2 25 04/10/2011   Lab Results  Component Value Date   CREATININE 1.01 04/10/2011   Lab Results  Component Value Date   INR 0.98 04/07/2011   INR 0.97 02/13/2011   Lab Results  Component Value Date   HGBA1C 6.1* 04/09/2011   CBG (last 3)  No results found for this basename: GLUCAP:3 in the last 72   hours  Carotid duplex of the right side today shows  that the right carotid artery still is patent with very high velocities. End diastolic velocity is 247 cm/s.  MEDICAL ISSUES:  ASYMPTOMATIC CAROTID ARTERY DISEASE: The patient has done well after left carotid endarterectomy. At this point I think it is safe to proceed with right carotid endarterectomy. We'll have him stop his Plavix one-week preoperatively. I have reviewed the indications for carotid endarterectomy, that is to lower the risk of future stroke. I have also reviewed the potential complications of surgery, including but not limited to: bleeding, stroke (perioperative risk 1-2%), MI, nerve injury of other unpredictable medical problems. All of the patients questions were answered and they are agreeable to proceed with surgery. His surgery scheduled is scheduled for 05/08/2011. He does know to continue taking his aspirin.  Shain Pauwels S [facility] [place] Beeper: [phone]    

## 2011-05-08 NOTE — Interval H&P Note (Signed)
History and Physical Interval Note:  05/08/2011 7:02 AM  [name]  has presented today for surgery, with the diagnosis of right internal carotid artery stenosis  The various methods of treatment have been discussed with the patient and family. After consideration of risks, benefits and other options for treatment, the patient has consented to: RIGHT CAROTID ENDARTERECTOMY.  The patients' history has been reviewed, patient examined, no change in status, stable for surgery.  I have reviewed the patients' chart and labs.  Questions were answered to the patient's satisfaction.     [name] S

## 2011-05-08 NOTE — Transfer of Care (Signed)
Immediate Anesthesia Transfer of Care Note  Patient: [name]  Procedure(s) Performed: Procedure(s) (LRB): ENDARTERECTOMY CAROTID (Right)  Patient Location: PACU  Anesthesia Type: General  Level of Consciousness: awake, alert  and oriented  Airway & Oxygen Therapy: Patient Spontanous Breathing and Patient connected to nasal cannula oxygen  Post-op Assessment: Report given to PACU RN, Post -op Vital signs reviewed and stable and Patient moving all extremities X 4  Post vital signs: Reviewed and stable  Complications: No apparent anesthesia complications

## 2011-05-08 NOTE — Op Note (Signed)
NAME: [name]   MRN: [id number] DOB: January 08, 1932    DATE OF OPERATION: 05/08/2011  PREOP DIAGNOSIS: asymptomatic greater than 80% right carotid stenosis  POSTOP DIAGNOSIS: same  PROCEDURE: right carotid endarterectomy with Dacron patch angioplasty  SURGEON: Di Kindle. Edilia Bo, MD, FACS  ASSIST: Lianne Cure PA  ANESTHESIA: Gen.   EBL: minimal  INDICATIONS: [name] is a 78 y.o. male who presented with bilateral severe carotid stenoses. He underwent left carotid endarterectomy approximately a month ago and has done well from that standpoint. He returns for elective staged right carotid endarterectomy.  FINDINGS: the patient had a 95% hemorrhagic plaque in the internal carotid artery.  TECHNIQUE: Patient was brought to the operating room and received a general anesthetic. Arterial line had been placed by anesthesia. The right neck was prepped and draped in usual sterile fashion an incision was made along the anterior border of the sternocleidomastoid and the dissection carried down to the common carotid artery which was dissected free and controlled with Rummel tourniquet. The facial vein was divided between 2-0 silk ties. The internal carotid artery was controlled above the plaque in the external carotid artery and superior thyroid arteries were controlled. The patient was then heparinized. Clamps were then placed on the internal then the common then the external carotid artery. A longitudinal arteriotomy was made in the common carotid artery and this is extended up through the plaque into the internal carotid artery. A 10 shunt was placed into the internal carotid artery, backbled and then placed into the common carotid artery and secured with Rummel tourniquet. Flow was reestablished the shunt. Endarterectomy plane was established proximally and the plaque was sharply divided. The reverse endarterectomy was performed of the external carotid artery. Distally there was a  nice taper in the plaque and no tacking sutures were required. The artery was irrigated with copious amounts of heparin and dextran all loose debris removed. A Dacron patch was then sewn using continuous 6-0 Prolene suture. Prior to completing the patch closure shunt was removed. The arteries were backbled and flushed appropriate lead and the anastomosis completed. Closure established first to the external carotid artery and into the internal carotid artery. The heparin was partially reversed with protamine. Layer was closed with running 3-0 Vicryl. The platysma was closed with running 3-0 Vicryl. The skin was closed with 4-0 subcuticular stitch. Dermabond was applied. The patient awoke neurologically intact. He was transferred to recovery in stable condition. All needle and sponge counts were correct.  Waverly Ferrari, MD, FACS [facility]  DATE OF DICTATION:   05/08/2011

## 2011-05-08 NOTE — Addendum Note (Signed)
Addendum  created 05/08/11 1215 by Marena Chancy, CRNA   Modules edited:Anesthesia Medication Administration

## 2011-05-08 NOTE — Progress Notes (Signed)
Antibiotic doses reviewed for renal function; doses ordered appropriate for current renal function.  Pharmacy will sign off.  Thank you.  Michelle Roben Schliep, PharmD, BCPS  

## 2011-05-09 ENCOUNTER — Encounter (HOSPITAL_COMMUNITY): Payer: Self-pay | Admitting: Emergency Medicine

## 2011-05-09 ENCOUNTER — Emergency Department (HOSPITAL_COMMUNITY)
Admission: EM | Admit: 2011-05-09 | Discharge: 2011-05-10 | Disposition: A | Discharge status: Home / Self Care | Payer: Medicare Other | Attending: Emergency Medicine | Admitting: Emergency Medicine

## 2011-05-09 DIAGNOSIS — I739 Peripheral vascular disease, unspecified: Secondary | ICD-10-CM | POA: Insufficient documentation

## 2011-05-09 DIAGNOSIS — R070 Pain in throat: Secondary | ICD-10-CM | POA: Insufficient documentation

## 2011-05-09 DIAGNOSIS — R1310 Dysphagia, unspecified: Secondary | ICD-10-CM | POA: Insufficient documentation

## 2011-05-09 DIAGNOSIS — E119 Type 2 diabetes mellitus without complications: Secondary | ICD-10-CM | POA: Insufficient documentation

## 2011-05-09 DIAGNOSIS — Z862 Personal history of diseases of the blood and blood-forming organs and certain disorders involving the immune mechanism: Secondary | ICD-10-CM | POA: Insufficient documentation

## 2011-05-09 DIAGNOSIS — M542 Cervicalgia: Secondary | ICD-10-CM | POA: Insufficient documentation

## 2011-05-09 DIAGNOSIS — G8918 Other acute postprocedural pain: Secondary | ICD-10-CM | POA: Insufficient documentation

## 2011-05-09 DIAGNOSIS — K219 Gastro-esophageal reflux disease without esophagitis: Secondary | ICD-10-CM | POA: Insufficient documentation

## 2011-05-09 DIAGNOSIS — I2510 Atherosclerotic heart disease of native coronary artery without angina pectoris: Secondary | ICD-10-CM | POA: Insufficient documentation

## 2011-05-09 DIAGNOSIS — R209 Unspecified disturbances of skin sensation: Secondary | ICD-10-CM | POA: Insufficient documentation

## 2011-05-09 DIAGNOSIS — E785 Hyperlipidemia, unspecified: Secondary | ICD-10-CM | POA: Insufficient documentation

## 2011-05-09 DIAGNOSIS — I10 Essential (primary) hypertension: Secondary | ICD-10-CM | POA: Insufficient documentation

## 2011-05-09 DIAGNOSIS — Z79899 Other long term (current) drug therapy: Secondary | ICD-10-CM | POA: Insufficient documentation

## 2011-05-09 DIAGNOSIS — Z7982 Long term (current) use of aspirin: Secondary | ICD-10-CM | POA: Insufficient documentation

## 2011-05-09 DIAGNOSIS — R51 Headache: Secondary | ICD-10-CM | POA: Insufficient documentation

## 2011-05-09 DIAGNOSIS — Z8639 Personal history of other endocrine, nutritional and metabolic disease: Secondary | ICD-10-CM | POA: Insufficient documentation

## 2011-05-09 DIAGNOSIS — Z8673 Personal history of transient ischemic attack (TIA), and cerebral infarction without residual deficits: Secondary | ICD-10-CM | POA: Insufficient documentation

## 2011-05-09 LAB — BASIC METABOLIC PANEL
CO2: 27 mEq/L (ref 19–32)
Calcium: 8.5 mg/dL (ref 8.4–10.5)
Chloride: 100 mEq/L (ref 96–112)
Creatinine, Ser: 1.06 mg/dL (ref 0.50–1.35)
Glucose, Bld: 103 mg/dL — ABNORMAL HIGH (ref 70–99)

## 2011-05-09 LAB — CBC
Hemoglobin: 12.4 g/dL — ABNORMAL LOW (ref 13.0–17.0)
MCH: 31.6 pg (ref 26.0–34.0)
MCV: 93.6 fL (ref 78.0–100.0)
Platelets: 151 10*3/uL (ref 150–400)
RBC: 3.93 MIL/uL — ABNORMAL LOW (ref 4.22–5.81)
WBC: 7.3 10*3/uL (ref 4.0–10.5)

## 2011-05-09 LAB — GLUCOSE, CAPILLARY: Glucose-Capillary: 113 mg/dL — ABNORMAL HIGH (ref 70–99)

## 2011-05-09 MED ORDER — FAMOTIDINE 20 MG PO TABS
20.0000 mg | ORAL_TABLET | Freq: Two times a day (BID) | ORAL | Status: DC
  Filled 2011-05-09 (×2): qty 1

## 2011-05-09 NOTE — Progress Notes (Signed)
D/C per MD order, pt VSS, D/C instructions given, family at [facility], prescription given, pt verbalized understanding of d/c, all questions answered

## 2011-05-09 NOTE — ED Notes (Signed)
PT. REPORTS SORE THROAT WITH SWELLING , HEADACHE , RIGHT NECK SWELLING AND BILATERAL LEGS WEAKNESS ONSET TODAY , STATES RECENT RIGHT CAROTID ARTERY SURGERY YESTERDAY - DISCHARGED THIS MORNING. AMBULATORY. RESPIRATIONS UNLABORED.

## 2011-05-09 NOTE — ED Provider Notes (Signed)
History     CSN: [id number]  Arrival date & time 05/09/11  2151   First MD Initiated Contact with Patient 05/09/11 2329      Chief Complaint  Patient presents with  . Sore Throat    (Consider location/radiation/quality/duration/timing/severity/associated sxs/prior treatment) HPI Comments: 78 year old male with a history of diabetes, hypertension, stroke, recent carotid endarterectomy who presents with complaint of right neck pain swallowing dysphasia and headache. According to the medical record the patient had a stroke in 2001, had followup carotid duplex ultrasound done in December of 2012 and was scheduled for staged bilateral carotid repair. In February he underwent left carotid endarterectomy and yesterday underwent right carotid endarterectomy. According to the records this was an uneventful procedure, the patient was neurovascularly intact prior to have postop. The patient states that he went home this morning, was able to drink, bruits, talk and eat a meal throughout the day. This evening he had a meal, took a nap and when he awoke he found her to avoid painful to swallow, felt like he was swallowing in a stroke and had swelling on the back with tenderness to palpation of the slight numbness just superior to surgical incision. Symptoms are persistent, nothing makes this better, denies fevers chills nausea or vomiting., worse with swallowing  Patient is a 78 y.o. male presenting with pharyngitis. The history is provided by the patient and medical records.  Sore Throat    Past Medical History  Diagnosis Date  . Diabetes mellitus type 2   . GERD (gastroesophageal reflux disease)   . Hyperlipidemia     takes Niacin nightly  . DJD (degenerative joint disease)   . Urosepsis   . Hypertension     takes Metoprolol and Ramipril daily  . Pneumonia     hx of 1959  . Dizziness   . CVA (cerebral vascular accident) 4+yrs ago    right arm/leg occ hurts  . Gout     hx of--70yrs ago  .  Itching     on right leg  . Bruises easily     pt is on Plavix and ASA  . Shortness of breath     wakes pt up during night with occ shortness of breathe;states it happens about once a month  . Impaired hearing   . Urinary frequency   . UTI (lower urinary tract infection)     hx of--03/2010  . Anemia     takes Glyburide bid  . CAD (coronary artery disease) of artery bypass graft   . Peripheral vascular disease   . S/P carotid endarterectomy     Past Surgical History  Procedure Date  . Appendectomy 1968  . Eye surgery     right eye surgery at 14yrs ago  . Cataract surgery 2012    bilateral  . Colonoscopy   . Endarterectomy 04/09/2011    Procedure: ENDARTERECTOMY CAROTID;  Surgeon: Chuck Hint, MD;  Location: [facility] OR;  Service: Vascular;  Laterality: Left;  Left Carotid endarterectomy With Dacron Patch Angioplasty  . Coronary artery bypass graft 1998    LIMA to LAD, SVG to diagonal, SVG to PDA, SVG to posterior lateral.  1988  . Cardiac catheterization 1998    dr hochrin    Family History  Problem Relation Age of Onset  . Diabetes Sister   . Cancer Brother   . Diabetes Brother   . Anesthesia problems Neg Hx   . Hypotension Neg Hx   . Malignant hyperthermia Neg Hx   .  Pseudochol deficiency Neg Hx     History  Substance Use Topics  . Smoking status: Former Smoker    Types: Cigarettes  . Smokeless tobacco: Not on file   Comment: quit 21yrs ago  . Alcohol Use: Yes     quit in 1981      Review of Systems  All other systems reviewed and are negative.    Allergies  Afrin nasal spray; Allegra; and Flonase  Home Medications   Current Outpatient Rx  Name Route Sig Dispense Refill  . ASPIRIN EC 325 MG PO TBEC Oral Take 325 mg by mouth daily.     Marland Kitchen CLOPIDOGREL BISULFATE 75 MG PO TABS Oral Take 75 mg by mouth daily.     . OMEGA-3 FATTY ACIDS 1000 MG PO CAPS Oral Take 1 g by mouth daily.     Marland Kitchen FLAX SEED OIL PO Oral Take 1,300 mg by mouth daily.     .  GLYBURIDE 2.5 MG PO TABS Oral Take 2.5 mg by mouth 2 (two) times daily with a meal.     . METOPROLOL TARTRATE 50 MG PO TABS Oral Take 25 mg by mouth 2 (two) times daily.     . ADULT MULTIVITAMIN W/MINERALS CH Oral Take 1 tablet by mouth daily.    Marland Kitchen NIACIN ER (ANTIHYPERLIPIDEMIC) 1000 MG PO TBCR Oral Take 1,000 mg by mouth at bedtime.     . OXYCODONE HCL 5 MG PO TABS Oral Take 5 mg by mouth every 4 (four) hours as needed. For pain    . RAMIPRIL 2.5 MG PO TABS Oral Take 2.5 mg by mouth daily.     Marland Kitchen SIMVASTATIN 20 MG PO TABS Oral Take 20 mg by mouth at bedtime.     . TAMSULOSIN HCL 0.4 MG PO CAPS Oral Take 0.4 mg by mouth daily.      BP 178/61  Pulse 87  Temp(Src) 98.6 F (37 C) (Oral)  Resp 20  SpO2 97%  Physical Exam  Nursing note and vitals reviewed. Constitutional: He appears well-developed and well-nourished. No distress.  HENT:  Head: Normocephalic and atraumatic.  Mouth/Throat: Oropharynx is clear and moist. No oropharyngeal exudate.       Oropharynx is clear, no tenderness in the mucosa, no swelling of the posterior oropharynx, ratio is normal, mucous members are moist  Eyes: Conjunctivae and EOM are normal. Pupils are equal, round, and reactive to light. Right eye exhibits no discharge. Left eye exhibits no discharge. No scleral icterus.  Neck: Normal range of motion. Neck supple. No JVD present. No thyromegaly present.       No bruits - mild ttp over the incision site - mild swelling surroundint the incision site which is C/D/I.    Cardiovascular: Normal rate, regular rhythm, normal heart sounds and intact distal pulses.  Exam reveals no gallop and no friction rub.   No murmur heard. Pulmonary/Chest: Effort normal and breath sounds normal. No respiratory distress. He has no wheezes. He has no rales.  Abdominal: Soft. Bowel sounds are normal. He exhibits no distension and no mass. There is no tenderness.  Musculoskeletal: Normal range of motion. He exhibits no edema and no  tenderness.  Lymphadenopathy:    He has no cervical adenopathy.  Neurological: He is alert. Coordination normal.       Neurologic exam:  Speech clear, pupils equal round reactive to light, extraocular movements intact  Normal peripheral visual fields Cranial nerves III through XII normal including no facial droop Follows commands, moves  all extremities x4, normal strength to bilateral upper and lower extremities at all major muscle groups including grip Sensation normal to light touch and pinprick other than small area superior to the incision site on the right neck which has slight decreased sensation to the angle of the jaw. Normal sensation to the face including the cheeks and forehead and lips. Coordination intact, no limb ataxia, finger-nose-finger normal Rapid alternating movements normal No pronator drift Gait normal   Skin: Skin is warm and dry. No rash noted. No erythema.  Psychiatric: He has a normal mood and affect. His behavior is normal.    ED Course  Procedures (including critical care time)  Labs Reviewed - No data to display Ct Soft Tissue Neck W Contrast  05/10/2011  *RADIOLOGY REPORT*  Clinical Data: Increased right neck swelling.  Pharyngitis and throat tenderness.  36 hours post carotid endarterectomy.  CT NECK WITH CONTRAST  Technique:  Multidetector CT imaging of the neck was performed with intravenous contrast.  Contrast: 80mL OMNIPAQUE IOHEXOL 300 MG/ML IJ SOLN  Comparison: None.  Findings: Scattered air in the soft tissues of the right neck. Mild right neck soft tissue swelling.  No fluid collections or masses demonstrated.  No enlarged lymph nodes.  The lung apices are clear.  Post CABG changes.  Cervical spine degenerative changes.  IMPRESSION: Expected postoperative changes in the right neck.  No hematoma or abscess.  Original Report Authenticated By: Darrol Angel, M.D.     1. Post-op pain       MDM  Vital signs reveal mild hypertension, neurologically  is intact, as it appears to be postop swallowing, we'll discuss what vascular surgery regarding possible imaging.  X-rays according to my interpretation and in agreement with radiology of her normal. There is no signs of hemorrhage or swelling or airway compromise. Findings described patient, pain in the throat may be related to the intubation for the procedure. There is no signs of hematoma or infection. There was discussed with the vascular surgeon on call who agrees with followup in the office.      Vida Roller, MD 05/10/11 [phone]

## 2011-05-09 NOTE — Progress Notes (Addendum)
[facility] Progress Note  05/09/2011 7:51 AM POD 1  Subjective:  No complaints this am.  Doing well.   Filed Vitals:   05/09/11 0355  BP: 155/55  Pulse: 76  Temp: 98.8 F (37.1 C)  Resp: 17     Physical Exam: Neuro:  In tact.  No deficit. Incision:  C/d/i without drainage.  CBC    Component Value Date/Time   WBC 4.7 05/05/2011 0936   RBC 4.26 05/05/2011 0936   HGB 12.9* 05/05/2011 0936   HCT 39.6 05/05/2011 0936   PLT 180 05/05/2011 0936   MCV 93.0 05/05/2011 0936   MCH 30.3 05/05/2011 0936   MCHC 32.6 05/05/2011 0936   RDW 14.1 05/05/2011 0936   LYMPHSABS 1.5 05/05/2011 0936   MONOABS 0.4 05/05/2011 0936   EOSABS 0.2 05/05/2011 0936   BASOSABS 0.0 05/05/2011 0936    BMET    Component Value Date/Time   NA 135 05/05/2011 0936   K 4.4 05/05/2011 0936   CL 99 05/05/2011 0936   CO2 29 05/05/2011 0936   GLUCOSE 84 05/05/2011 0936   BUN 13 05/05/2011 0936   CREATININE 1.05 05/05/2011 0936   CALCIUM 9.4 05/05/2011 0936   GFRNONAA 66* 05/05/2011 0936   GFRAA 77* 05/05/2011 0936     Intake/Output Summary (Last 24 hours) at 05/09/11 0751 Last data filed at 05/09/11 0500  Gross per 24 hour  Intake   3320 ml  Output   3725 ml  Net   -405 ml      Assessment/Plan:  This is a 78 y.o. male who is s/p right CEA POD 1 -doing well this am.  Neuro is in tact. -pt needs to void before being d/c'd home. -/fu with Dr. Edilia Bo in 2 weeks.  Newton Pigg, PA-C [facility] [phone]  Addendum  I have independently interviewed and examined the patient, and I agree with the physician assistant's findings.  No hematoma.  Inc c/d/i.  Neuro intact.  D/C home after voids  Leonides Sake, MD [facility] [place] Office: [phone] Pager: [phone]  05/09/2011, 9:52 AM

## 2011-05-10 ENCOUNTER — Emergency Department (HOSPITAL_COMMUNITY): Payer: Medicare Other

## 2011-05-10 MED ORDER — IOHEXOL 300 MG/ML  SOLN
80.0000 mL | Freq: Once | INTRAMUSCULAR | Status: AC | PRN
  Administered 2011-05-10: 80 mL via INTRAVENOUS

## 2011-05-10 NOTE — Discharge Instructions (Signed)
Your x-ray shows no signs of abnormal bleeding or swelling at your surgical site. I have discussed your care with the vascular surgeon on call who agrees with following up in the office at your followup appointment. If you develop severe or worsening symptoms you should return to the emergency department immediately

## 2011-05-10 NOTE — ED Notes (Signed)
Patient s/p endartarectomy today.  Patient now having pain with sore throat and pain in right neck.  Patient states there is swelling in right neck.  Patient states that he did not have pain and sore throat with the left side one month ago.

## 2011-05-11 ENCOUNTER — Encounter (HOSPITAL_COMMUNITY): Payer: Self-pay | Admitting: Vascular Surgery

## 2011-05-11 NOTE — Addendum Note (Signed)
Addendum  created 05/11/11 1353 by Kalila Adkison Bruno Roland Prine, CRNA   Modules edited:Anesthesia Medication Administration    

## 2011-05-11 NOTE — Addendum Note (Signed)
Addendum  created 05/11/11 1353 by Fuller Canada, CRNA   Modules edited:Anesthesia Medication Administration

## 2011-05-13 NOTE — Addendum Note (Signed)
Addendum  created 05/13/11 1125 by Adair Laundry, CRNA   Modules edited:Anesthesia Medication Administration

## 2011-05-13 NOTE — Addendum Note (Signed)
Addendum  created 05/13/11 1123 by Adair Laundry, CRNA   Modules edited:Anesthesia Medication Administration

## 2011-05-14 ENCOUNTER — Emergency Department (HOSPITAL_COMMUNITY): Payer: Medicare Other

## 2011-05-14 ENCOUNTER — Ambulatory Visit (HOSPITAL_COMMUNITY): Payer: Medicare Other

## 2011-05-14 ENCOUNTER — Inpatient Hospital Stay (HOSPITAL_COMMUNITY): Payer: Medicare Other

## 2011-05-14 ENCOUNTER — Encounter (HOSPITAL_COMMUNITY): Payer: Self-pay | Admitting: Radiology

## 2011-05-14 ENCOUNTER — Other Ambulatory Visit: Payer: Self-pay

## 2011-05-14 ENCOUNTER — Inpatient Hospital Stay (HOSPITAL_COMMUNITY)
Admission: EM | Admit: 2011-05-14 | Discharge: 2011-05-15 | DRG: 101 | Disposition: A | Discharge status: Home / Self Care | Payer: Medicare Other | Attending: Internal Medicine | Admitting: Internal Medicine

## 2011-05-14 DIAGNOSIS — Z8673 Personal history of transient ischemic attack (TIA), and cerebral infarction without residual deficits: Secondary | ICD-10-CM

## 2011-05-14 DIAGNOSIS — E785 Hyperlipidemia, unspecified: Secondary | ICD-10-CM

## 2011-05-14 DIAGNOSIS — Z7982 Long term (current) use of aspirin: Secondary | ICD-10-CM

## 2011-05-14 DIAGNOSIS — Z79899 Other long term (current) drug therapy: Secondary | ICD-10-CM

## 2011-05-14 DIAGNOSIS — D649 Anemia, unspecified: Secondary | ICD-10-CM | POA: Diagnosis present

## 2011-05-14 DIAGNOSIS — H9190 Unspecified hearing loss, unspecified ear: Secondary | ICD-10-CM | POA: Diagnosis present

## 2011-05-14 DIAGNOSIS — I2510 Atherosclerotic heart disease of native coronary artery without angina pectoris: Secondary | ICD-10-CM | POA: Diagnosis present

## 2011-05-14 DIAGNOSIS — R569 Unspecified convulsions: Secondary | ICD-10-CM

## 2011-05-14 DIAGNOSIS — E119 Type 2 diabetes mellitus without complications: Secondary | ICD-10-CM | POA: Diagnosis present

## 2011-05-14 DIAGNOSIS — I10 Essential (primary) hypertension: Secondary | ICD-10-CM

## 2011-05-14 DIAGNOSIS — Z87891 Personal history of nicotine dependence: Secondary | ICD-10-CM

## 2011-05-14 DIAGNOSIS — G40901 Epilepsy, unspecified, not intractable, with status epilepticus: Secondary | ICD-10-CM

## 2011-05-14 DIAGNOSIS — I6529 Occlusion and stenosis of unspecified carotid artery: Secondary | ICD-10-CM

## 2011-05-14 DIAGNOSIS — G459 Transient cerebral ischemic attack, unspecified: Secondary | ICD-10-CM

## 2011-05-14 DIAGNOSIS — Z951 Presence of aortocoronary bypass graft: Secondary | ICD-10-CM

## 2011-05-14 DIAGNOSIS — I739 Peripheral vascular disease, unspecified: Secondary | ICD-10-CM

## 2011-05-14 DIAGNOSIS — I959 Hypotension, unspecified: Secondary | ICD-10-CM

## 2011-05-14 DIAGNOSIS — Z01810 Encounter for preprocedural cardiovascular examination: Secondary | ICD-10-CM

## 2011-05-14 DIAGNOSIS — I25810 Atherosclerosis of coronary artery bypass graft(s) without angina pectoris: Secondary | ICD-10-CM

## 2011-05-14 HISTORY — DX: Unspecified convulsions: R56.9

## 2011-05-14 LAB — POCT I-STAT TROPONIN I: Troponin i, poc: 0.03 ng/mL (ref 0.00–0.08)

## 2011-05-14 LAB — CARDIAC PANEL(CRET KIN+CKTOT+MB+TROPI)
CK, MB: 7.5 ng/mL (ref 0.3–4.0)
CK, MB: 6.1 ng/mL (ref 0.3–4.0)
Relative Index: 3.7 — ABNORMAL HIGH (ref 0.0–2.5)
Total CK: 190 U/L (ref 7–232)
Total CK: 164 U/L (ref 7–232)
Troponin I: 0.44 ng/mL (ref <0.30)

## 2011-05-14 LAB — POCT I-STAT, CHEM 8
Chloride: 101 mEq/L (ref 96–112)
HCT: 38 % — ABNORMAL LOW (ref 39.0–52.0)
Potassium: 3.4 mEq/L — ABNORMAL LOW (ref 3.5–5.1)
Sodium: 139 mEq/L (ref 135–145)

## 2011-05-14 LAB — DIFFERENTIAL
Basophils Relative: 0 % (ref 0–1)
Eosinophils Absolute: 0.3 10*3/uL (ref 0.0–0.7)
Lymphocytes Relative: 12 % (ref 12–46)
Neutrophils Relative %: 82 % — ABNORMAL HIGH (ref 43–77)

## 2011-05-14 LAB — LACTIC ACID, PLASMA: Lactic Acid, Venous: 9.6 mmol/L — ABNORMAL HIGH (ref 0.5–2.2)

## 2011-05-14 LAB — URINE MICROSCOPIC-ADD ON

## 2011-05-14 LAB — URINALYSIS, ROUTINE W REFLEX MICROSCOPIC
Ketones, ur: NEGATIVE mg/dL
Leukocytes, UA: NEGATIVE
Nitrite: NEGATIVE
Protein, ur: NEGATIVE mg/dL
Urobilinogen, UA: 0.2 mg/dL (ref 0.0–1.0)
pH: 6 (ref 5.0–8.0)

## 2011-05-14 LAB — GLUCOSE, CAPILLARY
Glucose-Capillary: 150 mg/dL — ABNORMAL HIGH (ref 70–99)
Glucose-Capillary: 123 mg/dL — ABNORMAL HIGH (ref 70–99)

## 2011-05-14 LAB — ETHANOL: Alcohol, Ethyl (B): <11 mg/dL (ref 0–11)

## 2011-05-14 LAB — CBC
Hemoglobin: 12.2 g/dL — ABNORMAL LOW (ref 13.0–17.0)
MCHC: 33 g/dL (ref 30.0–36.0)
RBC: 3.91 MIL/uL — ABNORMAL LOW (ref 4.22–5.81)

## 2011-05-14 MED ORDER — ACETAMINOPHEN 325 MG PO TABS: 650.0000 mg | ORAL_TABLET | ORAL | Status: DC | PRN

## 2011-05-14 MED ORDER — METOPROLOL TARTRATE 12.5 MG HALF TABLET
12.5000 mg | ORAL_TABLET | Freq: Two times a day (BID) | ORAL | Status: DC
  Administered 2011-05-14 – 2011-05-15 (×3): 12.5 mg via ORAL
  Filled 2011-05-14 (×4): qty 1

## 2011-05-14 MED ORDER — TAMSULOSIN HCL 0.4 MG PO CAPS
0.4000 mg | ORAL_CAPSULE | Freq: Every day | ORAL | Status: DC
  Administered 2011-05-14 – 2011-05-15 (×2): 0.4 mg via ORAL
  Filled 2011-05-14 (×2): qty 1

## 2011-05-14 MED ORDER — GADOBENATE DIMEGLUMINE 529 MG/ML IV SOLN
15.0000 mL | Freq: Once | INTRAVENOUS | Status: AC
  Administered 2011-05-14: 15 mL via INTRAVENOUS

## 2011-05-14 MED ORDER — SIMVASTATIN 20 MG PO TABS
20.0000 mg | ORAL_TABLET | Freq: Every day | ORAL | Status: DC
  Administered 2011-05-14: 20 mg via ORAL
  Filled 2011-05-14 (×2): qty 1

## 2011-05-14 MED ORDER — SODIUM CHLORIDE 0.9 % IV SOLN
INTRAVENOUS | Status: AC
  Administered 2011-05-14: 75 mL/h via INTRAVENOUS

## 2011-05-14 MED ORDER — SODIUM CHLORIDE 0.9 % IV SOLN
750.0000 mg | Freq: Two times a day (BID) | INTRAVENOUS | Status: DC
  Administered 2011-05-14 – 2011-05-15 (×3): 750 mg via INTRAVENOUS
  Filled 2011-05-14 (×5): qty 7.5

## 2011-05-14 MED ORDER — ASPIRIN EC 325 MG PO TBEC
325.0000 mg | DELAYED_RELEASE_TABLET | Freq: Every day | ORAL | Status: DC
  Administered 2011-05-14 – 2011-05-15 (×2): 325 mg via ORAL
  Filled 2011-05-14 (×2): qty 1

## 2011-05-14 MED ORDER — SODIUM CHLORIDE 0.9 % IV SOLN
1000.0000 mg | INTRAVENOUS | Status: AC
  Administered 2011-05-14: 1000 mg via INTRAVENOUS
  Filled 2011-05-14: qty 10

## 2011-05-14 MED ORDER — INSULIN ASPART 100 UNIT/ML ~~LOC~~ SOLN
0.0000 [IU] | Freq: Three times a day (TID) | SUBCUTANEOUS | Status: DC
  Administered 2011-05-15: 2 [IU] via SUBCUTANEOUS

## 2011-05-14 MED ORDER — NIACIN ER (ANTIHYPERLIPIDEMIC) 1000 MG PO TBCR: 1000.0000 mg | EXTENDED_RELEASE_TABLET | Freq: Every day | ORAL | Status: DC

## 2011-05-14 MED ORDER — INSULIN ASPART 100 UNIT/ML ~~LOC~~ SOLN: 0.0000 [IU] | Freq: Every day | SUBCUTANEOUS | Status: DC

## 2011-05-14 MED ORDER — OXYCODONE HCL 5 MG PO TABS
5.0000 mg | ORAL_TABLET | ORAL | Status: DC | PRN
  Administered 2011-05-14 (×3): 5 mg via ORAL
  Filled 2011-05-14 (×3): qty 1

## 2011-05-14 MED ORDER — NIACIN ER 500 MG PO CPCR
1000.0000 mg | ORAL_CAPSULE | Freq: Every day | ORAL | Status: DC
  Administered 2011-05-14: 1000 mg via ORAL
  Filled 2011-05-14 (×2): qty 2

## 2011-05-14 MED ORDER — POTASSIUM CHLORIDE 10 MEQ/100ML IV SOLN
10.0000 meq | INTRAVENOUS | Status: AC
  Administered 2011-05-14 (×2): 10 meq via INTRAVENOUS
  Filled 2011-05-14 (×2): qty 100

## 2011-05-14 NOTE — Progress Notes (Signed)
Patient admitted earlier today after a seizure at home. Of note, he had a recent CEA on 3/8. Neurology has seen him and started him on keppra. No further seizure activity in the hospital. MRI results have been reviewed with neuro PA Andres Labrum, who has in turn reviewed with neurologist Dr. Lyman Speller. They do not believe cerebritis is an issue. Likely seizure had to do with hyperperfusion state after CEA. Will continue to follow. Obtain PT/OT evals to determine best discharge disposition.

## 2011-05-14 NOTE — ED Notes (Signed)
Wife at bedside; plan of care discussed; pt presently awake, alert, oriented; approp in conversation

## 2011-05-14 NOTE — ED Notes (Signed)
Patient transported to CT on monitor with RN at bedside

## 2011-05-14 NOTE — ED Notes (Signed)
Per EMS, Valium 6 mg total given IVP PTA

## 2011-05-14 NOTE — ED Provider Notes (Addendum)
History     CSN: [id number]  Arrival date & time 05/14/11  0226   First MD Initiated Contact with Patient 05/14/11 0228      Chief Complaint  Patient presents with  . Seizures    Pt was at home and heard pt having seizure.  Pt was in floor crawling around.  Pt had lt and rt side nec.  Pt has abrasions on rt forehead, rt cheek, nose.  EMS was called at 0055.  Pt had seizure for unknown time, had seizure with EMS 2 minutes, grand mal, 20 min later had another siezure.  Pt was not speaking with EMS, was combative.  Pt had 12 lead, ST, Glucose on scene 105 then 122 then 150.  18g, rac? intact.  Pt does not have hx of seizures.    (Consider location/radiation/quality/duration/timing/severity/associated sxs/prior treatment) Patient is a 78 y.o. male presenting with seizures. The history is provided by the EMS personnel and the spouse. The history is limited by the condition of the patient. No language interpreter was used.  Seizures  This is a new problem. The current episode started 1 to 2 hours ago. The problem has been resolved. There were 2 to 3 seizures. The most recent episode lasted more than 5 minutes. Associated symptoms include sleepiness and confusion. Characteristics include rhythmic jerking, loss of consciousness and bit tongue. The episode was witnessed. The seizures did not continue in the ED. The seizure(s) had no focality. none There has been no fever. Medications administered prior to arrival include diazepam IV.    Past Medical History  Diagnosis Date  . Diabetes mellitus type 2   . GERD (gastroesophageal reflux disease)   . Hyperlipidemia     takes Niacin nightly  . DJD (degenerative joint disease)   . Urosepsis   . Hypertension     takes Metoprolol and Ramipril daily  . Pneumonia     hx of 1959  . Dizziness   . CVA (cerebral vascular accident) 4+yrs ago    right arm/leg occ hurts  . Gout     hx of--57yrs ago  . Itching     on right leg  . Bruises easily     pt  is on Plavix and ASA  . Shortness of breath     wakes pt up during night with occ shortness of breathe;states it happens about once a month  . Impaired hearing   . Urinary frequency   . UTI (lower urinary tract infection)     hx of--03/2010  . Anemia     takes Glyburide bid  . CAD (coronary artery disease) of artery bypass graft   . Peripheral vascular disease   . S/P carotid endarterectomy     Past Surgical History  Procedure Date  . Appendectomy 1968  . Eye surgery     right eye surgery at 62yrs ago  . Cataract surgery 2012    bilateral  . Colonoscopy   . Endarterectomy 04/09/2011    Procedure: ENDARTERECTOMY CAROTID;  Surgeon: Chuck Hint, MD;  Location: [facility] OR;  Service: Vascular;  Laterality: Left;  Left Carotid endarterectomy With Dacron Patch Angioplasty  . Coronary artery bypass graft 1998    LIMA to LAD, SVG to diagonal, SVG to PDA, SVG to posterior lateral.  1988  . Cardiac catheterization 1998    dr hochrin  . Endarterectomy 05/08/2011    Procedure: ENDARTERECTOMY CAROTID;  Surgeon: Chuck Hint, MD;  Location: [facility] OR;  Service: Vascular;  Laterality: Right;  with [place] Patch Angioplasty    Family History  Problem Relation Age of Onset  . Diabetes Sister   . Cancer Brother   . Diabetes Brother   . Anesthesia problems Neg Hx   . Hypotension Neg Hx   . Malignant hyperthermia Neg Hx   . Pseudochol deficiency Neg Hx     History  Substance Use Topics  . Smoking status: Former Smoker    Types: Cigarettes  . Smokeless tobacco: Not on file   Comment: quit 79yrs ago  . Alcohol Use: Yes     quit in 1981      Review of Systems  Unable to perform ROS Neurological: Positive for seizures and loss of consciousness.  Psychiatric/Behavioral: Positive for confusion.    Allergies  Afrin nasal spray; Allegra; and Flonase  Home Medications   Current Outpatient Rx  Name Route Sig Dispense Refill  . ASPIRIN EC 325 MG PO TBEC Oral Take 325 mg  by mouth daily.     Marland Kitchen CLOPIDOGREL BISULFATE 75 MG PO TABS Oral Take 75 mg by mouth daily.     . OMEGA-3 FATTY ACIDS 1000 MG PO CAPS Oral Take 1 g by mouth daily.     Marland Kitchen FLAX SEED OIL PO Oral Take 1,300 mg by mouth daily.     . GLYBURIDE 2.5 MG PO TABS Oral Take 2.5 mg by mouth 2 (two) times daily with a meal.     . METOPROLOL TARTRATE 50 MG PO TABS Oral Take 25 mg by mouth 2 (two) times daily.     . ADULT MULTIVITAMIN W/MINERALS CH Oral Take 1 tablet by mouth daily.    Marland Kitchen NIACIN ER (ANTIHYPERLIPIDEMIC) 1000 MG PO TBCR Oral Take 1,000 mg by mouth at bedtime.     . OXYCODONE HCL 5 MG PO TABS Oral Take 5 mg by mouth every 4 (four) hours as needed. For pain    . RAMIPRIL 2.5 MG PO TABS Oral Take 2.5 mg by mouth daily.     Marland Kitchen SIMVASTATIN 20 MG PO TABS Oral Take 20 mg by mouth at bedtime.     . TAMSULOSIN HCL 0.4 MG PO CAPS Oral Take 0.4 mg by mouth daily.      BP 115/56  Pulse 104  Temp(Src) 97.8 F (36.6 C) (Oral)  Resp 26  SpO2 96%  Physical Exam  Constitutional: He appears well-developed and well-nourished. No distress.  HENT:       Facial and forehead abrasions B  Eyes: Conjunctivae are normal. Pupils are equal, round, and reactive to light.  Neck: No tracheal deviation present.  Cardiovascular: Tachycardia present.   Pulmonary/Chest: Effort normal and breath sounds normal. No stridor. He has no wheezes. He has no rales.  Abdominal: Soft. Bowel sounds are normal. There is no tenderness. There is no rebound and no guarding.  Musculoskeletal: Normal range of motion.  Neurological:       Confused but awake  Skin: Skin is warm and dry.    ED Course  Procedures (including critical care time)  Labs Reviewed  CBC - Abnormal; Notable for the following:    WBC 13.9 (*)    RBC 3.91 (*)    Hemoglobin 12.2 (*)    HCT 37.0 (*)    All other components within normal limits  POCT I-STAT, CHEM 8 - Abnormal; Notable for the following:    Potassium 3.4 (*)    Glucose, Bld 164 (*)     Hemoglobin 12.9 (*)    HCT 38.0 (*)  All other components within normal limits  DIFFERENTIAL  POCT I-STAT TROPONIN I  URINALYSIS, ROUTINE W REFLEX MICROSCOPIC  URINE CULTURE  LACTIC ACID, PLASMA   Ct Head Wo Contrast  05/14/2011  *RADIOLOGY REPORT*  Clinical Data: Seizure,  CT HEAD WITHOUT CONTRAST,CT CERVICAL SPINE WITHOUT CONTRAST  Technique:  Contiguous axial images were obtained from the base of the skull through the vertex without contrast.,Technique: Multidetector CT imaging of the cervical spine was performed. Multiplanar CT image reconstructions were also generated.  Comparison: 05/10/2011 neck CT, 07/18/2007 head CT  Findings: Prominence of the sulci, cisterns, and ventricles, in keeping with volume loss. There are subcortical and periventricular white matter hypodensities, a nonspecific finding most often seen with chronic microangiopathic changes.  There is no evidence for acute hemorrhage, overt hydrocephalus, mass lesion, or abnormal extra-axial fluid collection.  No definite CT evidence for acute cortical based (large artery) infarction. Remote appearing lacunar infarction within the right basal ganglia.  The visualized paranasal sinuses and mastoid air cells are predominately clear.  No displaced calvarial fracture.  Cervical spine: Multilevel degenerative change.  No acute fracture or dislocation identified.  No aggressive osseous lesion.  No prevertebral soft tissue swelling.  IMPRESSION: Mild volume loss and white matter changes.  Remote appearing right basal ganglia lacunar infarction.  No definite acute intracranial abnormality.  Multilevel degenerative changes of the cervical spine.  No acute fracture or dislocation identified.  Original Report Authenticated By: Waneta Martins, M.D.   Ct Cervical Spine Wo Contrast  05/14/2011  *RADIOLOGY REPORT*  Clinical Data: Seizure,  CT HEAD WITHOUT CONTRAST,CT CERVICAL SPINE WITHOUT CONTRAST  Technique:  Contiguous axial images were  obtained from the base of the skull through the vertex without contrast.,Technique: Multidetector CT imaging of the cervical spine was performed. Multiplanar CT image reconstructions were also generated.  Comparison: 05/10/2011 neck CT, 07/18/2007 head CT  Findings: Prominence of the sulci, cisterns, and ventricles, in keeping with volume loss. There are subcortical and periventricular white matter hypodensities, a nonspecific finding most often seen with chronic microangiopathic changes.  There is no evidence for acute hemorrhage, overt hydrocephalus, mass lesion, or abnormal extra-axial fluid collection.  No definite CT evidence for acute cortical based (large artery) infarction. Remote appearing lacunar infarction within the right basal ganglia.  The visualized paranasal sinuses and mastoid air cells are predominately clear.  No displaced calvarial fracture.  Cervical spine: Multilevel degenerative change.  No acute fracture or dislocation identified.  No aggressive osseous lesion.  No prevertebral soft tissue swelling.  IMPRESSION: Mild volume loss and white matter changes.  Remote appearing right basal ganglia lacunar infarction.  No definite acute intracranial abnormality.  Multilevel degenerative changes of the cervical spine.  No acute fracture or dislocation identified.  Original Report Authenticated By: Waneta Martins, M.D.   Dg Chest Port 1 View  05/14/2011  *RADIOLOGY REPORT*  Clinical Data: Shortness of breath  PORTABLE CHEST - 1 VIEW  Comparison: 02/13/2011  Findings: Hypoaeration results in interstitial and vascular crowding.  Status post median sternotomy and CABG.  Heart size upper normal limits to mildly enlarged.  Mild central vascular fullness without overt edema.  No pneumothorax.  No pleural effusion.  No acute osseous abnormality.  IMPRESSION: Hypoaeration results in interstitial crowding.  No focal consolidation.  Heart size upper normal limits to mildly enlarged, status post median  sternotomy and CABG.  Original Report Authenticated By: Waneta Martins, M.D.     No diagnosis found.    MDM  Seen by  neuro hospitalist  Date: 05/14/2011  Rate:90  Rhythm: normal sinus rhythm  QRS Axis: normal  Intervals: normal  ST/T Wave abnormalities: normal  Conduction Disutrbances:none  Narrative Interpretation:   Old EKG Reviewed: none available        Leemon Ayala K Chao Blazejewski-Rasch, MD 05/14/11 0354  Houa Nie K Bayne Fosnaugh-Rasch, MD 05/14/11 [phone]

## 2011-05-14 NOTE — ED Notes (Addendum)
MD at bedside. Doutova MD

## 2011-05-14 NOTE — Progress Notes (Signed)
[facility]  Only possible relationship between CEA and seizure activity is cerebral hyperperfusion syndrome which results in intracranial hemorrhage.  No evidence of such on non-contrast Head  CT.  Keep BP under control (SBP < 150) to avoid cerebral hyperperfusion syndrome.  - will check on patient later  Leonides Sake, MD [facility]: [phone] Pager: [phone]  05/14/2011, 8:38 AM

## 2011-05-14 NOTE — H&P (Addendum)
PCP:   Dolores Hoose, OTR  Vascular surgery: Durwin Nora  Chief Complaint:   seizure  HPI: [name] is a 78 y.o. male   has a past medical history of Diabetes mellitus type 2; GERD (gastroesophageal reflux disease); Hyperlipidemia; DJD (degenerative joint disease); Urosepsis; Hypertension; Pneumonia; Dizziness; CVA (cerebral vascular accident) (4+yrs ago); Gout; Itching; Bruises easily; Shortness of breath; Impaired hearing; Urinary frequency; UTI (lower urinary tract infection); Anemia; CAD (coronary artery disease) of artery bypass graft; Peripheral vascular disease; and S/P carotid endarterectomy.   Presented with  Possible seizure episode this am. Of note he had right carotid endarterectomy done on 3/8. Wife found him on the floor face down having apparent tonic clonic seizure with tongue biting. Wife could not turn him over called EMS. Apparently was able to speak some time after the event.  He had one more after EMS arrived and one more in ER. Patient cannot recollect any of this. He received valium by EMS and then was loaded with 1 gm of Keppra in ED. He was seen by neurology who wish for medicine to admit but did recommend MRI of the brain, MRA and MRA of the neck as well as EEG.    Review of Systems:    Pertinent positives include:  Constitutional:  No weight loss, night sweats, Fevers, chills, fatigue, weight loss  HEENT:  No headaches, Difficulty swallowing,Tooth/dental problems,Sore throat,  No sneezing, itching, ear ache, nasal congestion, post nasal drip,  Cardio-vascular:  No chest pain, Orthopnea, PND, anasarca, dizziness, palpitations.no Bilateral lower extremity swelling  GI:  No heartburn, indigestion, abdominal pain, nausea, vomiting, diarrhea, change in bowel habits, loss of appetite, melena, blood in stool, hematoemesis Resp:  no shortness of breath at rest. No dyspnea on exertion, No excess mucus, no productive cough, No non-productive cough,  No coughing up of blood.No change in color of mucus.No wheezing. Skin:  no rash or lesions. No jaundice GU:  no dysuria, change in color of urine, no urgency or frequency. No straining to urinate.  No flank pain.  Musculoskeletal:  No joint pain or no joint swelling. No decreased range of motion. No back pain.  Psych:  No change in mood or affect. No depression or anxiety. No memory loss.  Neuro: no localizing neurological complaints, no tingling, no weakness, no double vision, no gait abnormality, no slurred speech, no confusion  Otherwise ROS are negative except for above, 10 systems were reviewed  Past Medical History: Past Medical History  Diagnosis Date  . Diabetes mellitus type 2   . GERD (gastroesophageal reflux disease)   . Hyperlipidemia     takes Niacin nightly  . DJD (degenerative joint disease)   . Urosepsis   . Hypertension     takes Metoprolol and Ramipril daily  . Pneumonia     hx of 1959  . Dizziness   . CVA (cerebral vascular accident) 4+yrs ago    right arm/leg occ hurts  . Gout     hx of--56yrs ago  . Itching     on right leg  . Bruises easily     pt is on Plavix and ASA  . Shortness of breath     wakes pt up during night with occ shortness of breathe;states it happens about once a month  . Impaired hearing   . Urinary frequency   . UTI (lower urinary tract infection)     hx of--03/2010  . Anemia     takes Glyburide bid  .  CAD (coronary artery disease) of artery bypass graft   . Peripheral vascular disease   . S/P carotid endarterectomy    Past Surgical History  Procedure Date  . Appendectomy 1968  . Eye surgery     right eye surgery at 77yrs ago  . Cataract surgery 2012    bilateral  . Colonoscopy   . Endarterectomy 04/09/2011    Procedure: ENDARTERECTOMY CAROTID;  Surgeon: Chuck Hint, MD;  Location: [facility] OR;  Service: Vascular;  Laterality: Left;  Left Carotid endarterectomy With Dacron Patch Angioplasty  . Coronary artery bypass  graft 1998    LIMA to LAD, SVG to diagonal, SVG to PDA, SVG to posterior lateral.  1988  . Cardiac catheterization 1998    dr hochrin  . Endarterectomy 05/08/2011    Procedure: ENDARTERECTOMY CAROTID;  Surgeon: Chuck Hint, MD;  Location: [facility] OR;  Service: Vascular;  Laterality: Right;  with Heamashield Patch Angioplasty     Medications: Prior to Admission medications   Medication Sig Start Date End Date Taking? Authorizing Provider  aspirin EC 325 MG tablet Take 325 mg by mouth daily.    Yes Historical Provider, MD  fish oil-omega-3 fatty acids 1000 MG capsule Take 1 g by mouth daily.    Yes Historical Provider, MD  Flaxseed, Linseed, (FLAX SEED OIL PO) Take 1,300 mg by mouth daily.    Yes Historical Provider, MD  glyBURIDE (DIABETA) 2.5 MG tablet Take 2.5 mg by mouth 2 (two) times daily with a meal.    Yes Historical Provider, MD  metoprolol (LOPRESSOR) 50 MG tablet Take 25 mg by mouth 2 (two) times daily.    Yes Historical Provider, MD  Multiple Vitamin (MULITIVITAMIN WITH MINERALS) TABS Take 1 tablet by mouth daily.   Yes Historical Provider, MD  niacin (NIASPAN) 1000 MG CR tablet Take 1,000 mg by mouth at bedtime.    Yes Historical Provider, MD  oxyCODONE (OXY IR/ROXICODONE) 5 MG immediate release tablet Take 5 mg by mouth every 4 (four) hours as needed. For pain 05/08/11 05/18/11 Yes Darl Householder, PA  ramipril (ALTACE) 2.5 MG tablet Take 2.5 mg by mouth daily.    Yes Historical Provider, MD  simvastatin (ZOCOR) 20 MG tablet Take 20 mg by mouth at bedtime.    Yes Historical Provider, MD  Tamsulosin HCl (FLOMAX) 0.4 MG CAPS Take 0.4 mg by mouth daily.   Yes Historical Provider, MD    Allergies:   Allergies  Allergen Reactions  . Afrin Nasal Spray Other (See Comments)    Heart attack symptoms   . Allegra Other (See Comments)    Heart attack symptoms  . Flonase (Fluticasone Propionate) Other (See Comments)    Heart attack symptoms    Social  History:  Ambulatoryindependently  Lives at home with wife   reports that he has quit smoking. His smoking use included Cigarettes. He does not have any smokeless tobacco history on file. He reports that he drinks alcohol. He reports that he does not use illicit drugs.   Family History: family history includes Cancer in his brother and Diabetes in his brother and sister.  There is no history of Anesthesia problems, and Hypotension, and Malignant hyperthermia, and Pseudochol deficiency, .    Physical Exam: Patient Vitals for the past 24 hrs:  BP Temp Temp src Pulse Resp SpO2  05/14/11 0300 115/56 mmHg - - - - 96 %  05/14/11 0239 116/48 mmHg - - 104  26  94 %  05/14/11 0231  71/57 mmHg 97.8 F (36.6 C) Oral 105  18  91 %    1. General:  in No Acute distress 2. Psychological: Alert and  Oriented to self and situation 3. Head/ENT:    Dry Mucous Membranes                          Traumatic with few bruises on the face, neck supple, R surgical site seems somewhat edematous but no evidence of infection                          Normal  Dentition 4. SKIN: normal  Skin turgor,  Skin clean Dry and intact no rash 5. Heart: Regular rate and rhythm no Murmur, Rub or gallop 6. Lungs: Clear to auscultation bilaterally, no wheezes or crackles   7. Abdomen: Soft, non-tender, Non distended, slightly enlarged bladder 8. Lower extremities: no clubbing, cyanosis, or edema 9. Neurologically: moving all 4 extremities equally: grips equal bilateraly , strength 5/5 in all 4 ext. CN 2-12 intact 10. MSK: Normal range of motion  body mass index is unknown because there is no height or weight on file.   Labs on Admission:   [facility] 05/14/11 0255  NA 139  K 3.4*  CL 101  CO2 --  GLUCOSE 164*  BUN 14  CREATININE 1.10  CALCIUM --  MG --  PHOS --   No results found for this basename: AST:2,ALT:2,ALKPHOS:2,BILITOT:2,PROT:2,ALBUMIN:2 in the last 72 hours No results found for this basename:  LIPASE:2,AMYLASE:2 in the last 72 hours  Basename 05/14/11 0255 05/14/11 0235  WBC -- 13.9*  NEUTROABS -- 11.3*  HGB 12.9* 12.2*  HCT 38.0* 37.0*  MCV -- 94.6  PLT -- 166   No results found for this basename: CKTOTAL:3,CKMB:3,CKMBINDEX:3,TROPONINI:3 in the last 72 hours No results found for this basename: TSH,T4TOTAL,FREET3,T3FREE,THYROIDAB in the last 72 hours No results found for this basename: VITAMINB12:2,FOLATE:2,FERRITIN:2,TIBC:2,IRON:2,RETICCTPCT:2 in the last 72 hours Lab Results  Component Value Date   HGBA1C 6.1* 04/09/2011    The CrCl is unknown because both a height and weight (above a minimum accepted value) are required for this calculation. ABG    Component Value Date/Time   TCO2 20 05/14/2011 0255     No results found for this basename: DDIMER     Other results:  I have pearsonaly reviewed this: ECG REPORT  Rate:90  Rhythm: NSR ST&T Change: no ischemia    Cultures: No results found for this basename: sdes, specrequest, cult, reptstatus       Radiological Exams on Admission: Ct Head Wo Contrast  05/14/2011  *RADIOLOGY REPORT*  Clinical Data: Seizure,  CT HEAD WITHOUT CONTRAST,CT CERVICAL SPINE WITHOUT CONTRAST  Technique:  Contiguous axial images were obtained from the base of the skull through the vertex without contrast.,Technique: Multidetector CT imaging of the cervical spine was performed. Multiplanar CT image reconstructions were also generated.  Comparison: 05/10/2011 neck CT, 07/18/2007 head CT  Findings: Prominence of the sulci, cisterns, and ventricles, in keeping with volume loss. There are subcortical and periventricular white matter hypodensities, a nonspecific finding most often seen with chronic microangiopathic changes.  There is no evidence for acute hemorrhage, overt hydrocephalus, mass lesion, or abnormal extra-axial fluid collection.  No definite CT evidence for acute cortical based (large artery) infarction. Remote appearing lacunar  infarction within the right basal ganglia.  The visualized paranasal sinuses and mastoid air cells are predominately clear.  No displaced  calvarial fracture.  Cervical spine: Multilevel degenerative change.  No acute fracture or dislocation identified.  No aggressive osseous lesion.  No prevertebral soft tissue swelling.  IMPRESSION: Mild volume loss and white matter changes.  Remote appearing right basal ganglia lacunar infarction.  No definite acute intracranial abnormality.  Multilevel degenerative changes of the cervical spine.  No acute fracture or dislocation identified.  Original Report Authenticated By: Waneta Martins, M.D.   Ct Cervical Spine Wo Contrast  05/14/2011  *RADIOLOGY REPORT*  Clinical Data: Seizure,  CT HEAD WITHOUT CONTRAST,CT CERVICAL SPINE WITHOUT CONTRAST  Technique:  Contiguous axial images were obtained from the base of the skull through the vertex without contrast.,Technique: Multidetector CT imaging of the cervical spine was performed. Multiplanar CT image reconstructions were also generated.  Comparison: 05/10/2011 neck CT, 07/18/2007 head CT  Findings: Prominence of the sulci, cisterns, and ventricles, in keeping with volume loss. There are subcortical and periventricular white matter hypodensities, a nonspecific finding most often seen with chronic microangiopathic changes.  There is no evidence for acute hemorrhage, overt hydrocephalus, mass lesion, or abnormal extra-axial fluid collection.  No definite CT evidence for acute cortical based (large artery) infarction. Remote appearing lacunar infarction within the right basal ganglia.  The visualized paranasal sinuses and mastoid air cells are predominately clear.  No displaced calvarial fracture.  Cervical spine: Multilevel degenerative change.  No acute fracture or dislocation identified.  No aggressive osseous lesion.  No prevertebral soft tissue swelling.  IMPRESSION: Mild volume loss and white matter changes.  Remote  appearing right basal ganglia lacunar infarction.  No definite acute intracranial abnormality.  Multilevel degenerative changes of the cervical spine.  No acute fracture or dislocation identified.  Original Report Authenticated By: Waneta Martins, M.D.   Dg Chest Port 1 View  05/14/2011  *RADIOLOGY REPORT*  Clinical Data: Shortness of breath  PORTABLE CHEST - 1 VIEW  Comparison: 02/13/2011  Findings: Hypoaeration results in interstitial and vascular crowding.  Status post median sternotomy and CABG.  Heart size upper normal limits to mildly enlarged.  Mild central vascular fullness without overt edema.  No pneumothorax.  No pleural effusion.  No acute osseous abnormality.  IMPRESSION: Hypoaeration results in interstitial crowding.  No focal consolidation.  Heart size upper normal limits to mildly enlarged, status post median sternotomy and CABG.  Original Report Authenticated By: Waneta Martins, M.D.    Assessment/Plan  79yo W PVD sp endarterectomy on 3/8 now with possible seizure will admit for further evaluation and asses for cva   Present on Admission:  .Seizure - as per neurology recommendations obtain MRI of brain w/wo cont. MRA of brain and neck EEG Will obtain echo and cycle CE since it is not completely clear the etiology of his presentation. Watch on telemetry Keppra IV 750mg  BID  .Diabetes mellitus type 2 - hold PO meds and put on SSI  .Hypertension - given some soft BP in ER will hold ramipril and cont metoprolol at decreased dose with holding parameters .Occlusion and stenosis of carotid artery without mention of cerebral infarction - Would benefit from vascular surgical consult given recent operation .Hypotension, unspecified - give IV fluid hold ramipril   Prophylaxis: SCD  CODE STATUS: Patient wished to be DNR/DNI which was confirmed by his family  I have spent a total of 55 min on this admition  Charmelle Soh 05/14/2011, 4:30 AM

## 2011-05-14 NOTE — Progress Notes (Signed)
   CARE MANAGEMENT NOTE 05/14/2011  Patient:  [name], [name]   Account Number:  [id number]  Date Initiated:  05/14/2011  Documentation initiated by:  Letha Cape  Subjective/Objective Assessment:   dx seizure and assess for cva  admit- lives with spouse.     Action/Plan:   Anticipated DC Date:  05/17/2011   Anticipated DC Plan:  HOME/SELF CARE      DC Planning Services  CM consult      Choice offered to / List presented to:             Status of service:  In process, will continue to follow Medicare Important Message given?   (If response is "NO", the following Medicare IM given date fields will be blank) Date Medicare IM given:   Date Additional Medicare IM given:    Discharge Disposition:    Per UR Regulation:    If discussed at Long Length of Stay Meetings, dates discussed:    Comments:  05/14/11 15:30 Letha Cape RN, BSN [phone] patient lives with spouse, NCM will continue to follow for dc needs.

## 2011-05-14 NOTE — ED Notes (Signed)
Pharmacy notified of Keppra order

## 2011-05-14 NOTE — Progress Notes (Signed)
Utilization review completed.  

## 2011-05-14 NOTE — Progress Notes (Signed)
MRI of brain has been reviewed by neurology.  MRI shows mild to moderate white matter disease and no acute stroke.   I have discussed patient with Dr. Lyman Speller and he  agrees with the above mentioned.   Felicie Morn PA-C [facility] [phone]  05/14/2011, 4:04 PM

## 2011-05-14 NOTE — Consult Note (Signed)
Neurology Consult Note  Referring Physician: Dr. Winfield Rast Primary Care Physician: Dolores Hoose, OTR  Chief Complaint: Seizure  HPI: [name] is a 78 y.o.  male with a history of prior stroke, and recent carotid endarterectomies bilaterally with the right side done on 05/08/11, who presents with an episode concerning for seizure.  The patient states that he has no memory of what happened.  He went to bed at 10:30 tonight and that's the last thing he remembers.  His wife states that she heard some loud noises which woke her up from sleep this morning.  She went into his room and he was shaking all over and hitting his head against the floor.  This lasted for an unknown period of time and stopped prior to EMS arriving.  He was then up and walking around confused and another episode happened.  There was no incontinence but he did bite his tongue.  He again had another episode while coming to the ER.  He is currently awake and has no specific complaints of numbness, weakness, double vision, blurry vision, dysarthria, dysphagia, or vertigo.  Current facility-administered medications:levETIRAcetam (KEPPRA) 1,000 mg in sodium chloride 0.9 % 100 mL IVPB, 1,000 mg, Intravenous, To Major, April K Palumbo-Rasch, MD, 1,000 mg at 05/14/11 0302 Current outpatient prescriptions:aspirin EC 325 MG tablet, Take 325 mg by mouth daily. , Disp: , Rfl: ;  fish oil-omega-3 fatty acids 1000 MG capsule, Take 1 g by mouth daily. , Disp: , Rfl: ;  Flaxseed, Linseed, (FLAX SEED OIL PO), Take 1,300 mg by mouth daily. , Disp: , Rfl: ;  glyBURIDE (DIABETA) 2.5 MG tablet, Take 2.5 mg by mouth 2 (two) times daily with a meal. , Disp: , Rfl:  metoprolol (LOPRESSOR) 50 MG tablet, Take 25 mg by mouth 2 (two) times daily. , Disp: , Rfl: ;  Multiple Vitamin (MULITIVITAMIN WITH MINERALS) TABS, Take 1 tablet by mouth daily., Disp: , Rfl: ;  niacin (NIASPAN) 1000 MG CR tablet, Take 1,000 mg by mouth at  bedtime. , Disp: , Rfl: ;  oxyCODONE (OXY IR/ROXICODONE) 5 MG immediate release tablet, Take 5 mg by mouth every 4 (four) hours as needed. For pain, Disp: , Rfl:  ramipril (ALTACE) 2.5 MG tablet, Take 2.5 mg by mouth daily. , Disp: , Rfl: ;  simvastatin (ZOCOR) 20 MG tablet, Take 20 mg by mouth at bedtime. , Disp: , Rfl: ;  Tamsulosin HCl (FLOMAX) 0.4 MG CAPS, Take 0.4 mg by mouth daily., Disp: , Rfl:   Past Medical History  Diagnosis Date  . Diabetes mellitus type 2   . GERD (gastroesophageal reflux disease)   . Hyperlipidemia     takes Niacin nightly  . DJD (degenerative joint disease)   . Urosepsis   . Hypertension     takes Metoprolol and Ramipril daily  . Pneumonia     hx of 1959  . Dizziness   . CVA (cerebral vascular accident) 4+yrs ago    right arm/leg occ hurts  . Gout     hx of--20yrs ago  . Itching     on right leg  . Bruises easily     pt is on Plavix and ASA  . Shortness of breath     wakes pt up during night with occ shortness of breathe;states it happens about once a month  . Impaired hearing   . Urinary frequency   . UTI (lower urinary tract infection)     hx of--03/2010  .  Anemia     takes Glyburide bid  . CAD (coronary artery disease) of artery bypass graft   . Peripheral vascular disease   . S/P carotid endarterectomy      Past Surgical History  Procedure Date  . Appendectomy 1968  . Eye surgery     right eye surgery at 30yrs ago  . Cataract surgery 2012    bilateral  . Colonoscopy   . Endarterectomy 04/09/2011    Procedure: ENDARTERECTOMY CAROTID;  Surgeon: Chuck Hint, MD;  Location: [facility] OR;  Service: Vascular;  Laterality: Left;  Left Carotid endarterectomy With Dacron Patch Angioplasty  . Coronary artery bypass graft 1998    LIMA to LAD, SVG to diagonal, SVG to PDA, SVG to posterior lateral.  1988  . Cardiac catheterization 1998    dr hochrin  . Endarterectomy 05/08/2011    Procedure: ENDARTERECTOMY CAROTID;  Surgeon: Chuck Hint, MD;  Location: [facility] OR;  Service: Vascular;  Laterality: Right;  with Heamashield Patch Angioplasty    Allergies  Allergen Reactions  . Afrin Nasal Spray Other (See Comments)    Heart attack symptoms   . Allegra Other (See Comments)    Heart attack symptoms  . Flonase (Fluticasone Propionate) Other (See Comments)    Heart attack symptoms    Family History  Problem Relation Age of Onset  . Diabetes Sister   . Cancer Brother   . Diabetes Brother   . Anesthesia problems Neg Hx   . Hypotension Neg Hx   . Malignant hyperthermia Neg Hx   . Pseudochol deficiency Neg Hx     History  Substance Use Topics  . Smoking status: Former Smoker    Types: Cigarettes  . Smokeless tobacco: Not on file   Comment: quit 47yrs ago  . Alcohol Use: Yes     quit in 1981      Review of Systems A complete review of systems was performed and was negative.  Physical Exam: BP 115/56  Pulse 104  Temp(Src) 97.8 F (36.6 C) (Oral)  Resp 26  SpO2 96%  GENERAL:     Well nourished, well hydrated, no acute distress. Multiple tongue ecchymosis.  CARDIOVASCULAR:   - Regular rate and rhythm, no thrills or palpable murmurs, S1, S2, no murmur, no rubs or gallops.   MENTAL STATUS EXAM:    - Orientation: Alert and oriented to person, place and time.  - Memory: Cooperative, follows commands well. Recent and remote memory normal.  - Attention, concentration: Attention span and concentration are normal.  - Language: Speech is clear and language is normal.  - Fund of knowledge: Aware of current events, vocabulary appropriate for patient age.   CRANIAL NERVES:    - CN 2 (Optic): Visual fields intact to confrontation, funduscopic examination without optic disk pallor or edema, retinal vessels are normal.  - CN 3,4,6 (EOM): Pupils equal and reactive to light and near full eye movement without nystagmus.  - CN 5 (Trigeminal): Facial sensation is normal, no weakness of masticatory muscles.  - CN 7  (Facial): No facial weakness or asymmetry.  - CN 8 (Auditory): Auditory acuity grossly normal.  - CN 9,10 (Glossophar): The uvula is midline, the palate elevates symmetrically.  - CN 11 (spinal access): Normal sternocleidomastoid and trapezius strength.  -CN 12 (Hypoglossal): The tongue is midline. No atrophy or fasciculations.   MOTOR: 5/5 strength throughout bilateral upper and lower extremities in muscle groups both proximally and distally.  Muscle Tone: Tone and muscle bulk  are normal in the upper and lower extremities.   REFLEXES:   - Biceps:                 (R): 1+  (L):1+  - Brachioradialis:    (R): 1+  (L): 1+  - Patellar:                (R): 2+   (L):2+  - Achilles:                (R): 0   (L):0  - Babinski:   (R): absent  (L): present  COORDINATION:  finger-to-nose intact, heel-to-shin intact, and rapid alternating movements intact, no tremor.   SENSATION:   Intact to light touch, vibration, pinprick. Negative Romberg test..   GAIT: Pt deferred  Diagnostic Studies: CT Head- Mild volume loss and white matter changes. Remote appearing right basal ganglia lacunar infarction. No definite acute intracranial abnormality  Impression: 78 y/o male with new onset seizure with essentially normal neurological exam.  The most likely cause is a small stroke from his surgery or reperfusion injury, but he doesn't have any major deficits on his neurological exam.  He has been afebrile so infectious causes of seizure are unlikely.  His basic lab work doesn't reveal a metabolic cause for new onset seizure.    Recommendations: Continue on Keppra 750 mg bid for now. Obtain MRI brain with and without contrast, MRA head and neck without contrast Obtain EEG   It has been a pleasure to participate in the care of this patient.  Best Regards, Lajuana Carry, MD

## 2011-05-15 ENCOUNTER — Inpatient Hospital Stay (HOSPITAL_COMMUNITY): Payer: Medicare Other

## 2011-05-15 LAB — GLUCOSE, CAPILLARY: Glucose-Capillary: 79 mg/dL (ref 70–99)

## 2011-05-15 LAB — CBC
MCH: 31 pg (ref 26.0–34.0)
MCHC: 33.2 g/dL (ref 30.0–36.0)
RDW: 14 % (ref 11.5–15.5)

## 2011-05-15 LAB — CARDIAC PANEL(CRET KIN+CKTOT+MB+TROPI)
CK, MB: 3.4 ng/mL (ref 0.3–4.0)
CK, MB: 3.2 ng/mL (ref 0.3–4.0)
CK, MB: 3.1 ng/mL (ref 0.3–4.0)
Relative Index: 2.2 (ref 0.0–2.5)
Relative Index: 0.6 (ref 0.0–2.5)
Total CK: 152 U/L (ref 7–232)
Total CK: 276 U/L — ABNORMAL HIGH (ref 7–232)
Total CK: 556 U/L — ABNORMAL HIGH (ref 7–232)
Troponin I: <0.3 ng/mL (ref <0.30)
Troponin I: <0.3 ng/mL (ref <0.30)

## 2011-05-15 LAB — LIPID PANEL
LDL Cholesterol: 61 mg/dL (ref 0–99)
Total CHOL/HDL Ratio: 3.6 RATIO
VLDL: 11 mg/dL (ref 0–40)

## 2011-05-15 LAB — BASIC METABOLIC PANEL
BUN: 8 mg/dL (ref 6–23)
Calcium: 8.4 mg/dL (ref 8.4–10.5)
Creatinine, Ser: 0.91 mg/dL (ref 0.50–1.35)
GFR calc Af Amer: >90 mL/min (ref >90)
GFR calc non Af Amer: 80 mL/min — ABNORMAL LOW (ref >90)
Glucose, Bld: 79 mg/dL (ref 70–99)

## 2011-05-15 MED ORDER — LEVETIRACETAM 750 MG PO TABS: 750.0000 mg | ORAL_TABLET | Freq: Two times a day (BID) | ORAL | Status: DC

## 2011-05-15 NOTE — Consult Note (Signed)
Subjective: Patient is stable.   Objective: Vital signs in last 24 hours: Temp:  [98 F (36.7 C)-99.3 F (37.4 C)] 98 F (36.7 C) (03/15 0546) Pulse Rate:  [73-84] 73  (03/15 0546) Resp:  [18-20] 20  (03/15 0546) BP: (130-144)/(62-72) 131/64 mmHg (03/15 0546) SpO2:  [92 %-97 %] 92 % (03/15 0546) FiO2 (%):  [40 %] 40 % (03/14 1607) Weight:  [88.45 kg (195 lb)] 88.45 kg (195 lb) (03/14 2100)  Intake/Output from previous day: 03/14 0701 - 03/15 0700 In: 1360 [P.O.:360; IV Piggyback:100] Out: 1925 [Urine:1925] Intake/Output this shift:   Nutritional status: Carb Control  Neurological exam: AAO*3. No aphasia.  Was able to tell me months of the year forwards and backwards correctly, exhibiting good attention span. Recall was 3 of 3 after 5 minutes. Followed complex commands. Cranial nerves: EOMI, PERRL. Visual fields were full. Sensation to V1 through V3 areas of the face was intact and symmetric throughout. There was no facial asymmetry. Hearing to finger rub was equal and symmetrical bilaterally. Shoulder shrug was 5/5 and symmetric bilaterally. Head rotation was 5/5 bilaterally. There was no dysarthria or palatal deviation. Motor: strength was 5/5 and symmetric throughout. Sensory: was intact throughout to light touch, pinpric. Coordination: finger-to-nosewere intact and symmetric bilaterally. Reflexes: were 1+ in upper extremities and 1+ at the knees and trace at the ankles. Plantar response was downgoing bilaterally. Gait: Romberg test was negative. There was no ataxia noted.  Lab Results:  Basename 05/15/11 0500 05/14/11 0255 05/14/11 0235  WBC 7.7 -- 13.9*  HGB 11.3* 12.9* --  HCT 34.0* 38.0* --  PLT 165 -- 166  NA 137 139 --  K 3.6 3.4* --  CL 102 101 --  CO2 26 -- --  GLUCOSE 79 164* --  BUN 8 14 --  CREATININE 0.91 1.10 --  CALCIUM 8.4 -- --  LABA1C -- -- --   Lipid Panel  Basename 05/15/11 0500  CHOL 100  TRIG 57  HDL 28*  CHOLHDL 3.6  VLDL 11  LDLCALC 61    Studies/Results: Ct Head Wo Contrast  05/14/2011  *RADIOLOGY REPORT*  Clinical Data: Seizure,  CT HEAD WITHOUT CONTRAST,CT CERVICAL SPINE WITHOUT CONTRAST  Technique:  Contiguous axial images were obtained from the base of the skull through the vertex without contrast.,Technique: Multidetector CT imaging of the cervical spine was performed. Multiplanar CT image reconstructions were also generated.  Comparison: 05/10/2011 neck CT, 07/18/2007 head CT  Findings: Prominence of the sulci, cisterns, and ventricles, in keeping with volume loss. There are subcortical and periventricular white matter hypodensities, a nonspecific finding most often seen with chronic microangiopathic changes.  There is no evidence for acute hemorrhage, overt hydrocephalus, mass lesion, or abnormal extra-axial fluid collection.  No definite CT evidence for acute cortical based (large artery) infarction. Remote appearing lacunar infarction within the right basal ganglia.  The visualized paranasal sinuses and mastoid air cells are predominately clear.  No displaced calvarial fracture.  Cervical spine: Multilevel degenerative change.  No acute fracture or dislocation identified.  No aggressive osseous lesion.  No prevertebral soft tissue swelling.  IMPRESSION: Mild volume loss and white matter changes.  Remote appearing right basal ganglia lacunar infarction.  No definite acute intracranial abnormality.  Multilevel degenerative changes of the cervical spine.  No acute fracture or dislocation identified.  Original Report Authenticated By: Waneta Martins, M.D.   Ct Cervical Spine Wo Contrast  05/14/2011  *RADIOLOGY REPORT*  Clinical Data: Seizure,  CT HEAD WITHOUT CONTRAST,CT CERVICAL SPINE  WITHOUT CONTRAST  Technique:  Contiguous axial images were obtained from the base of the skull through the vertex without contrast.,Technique: Multidetector CT imaging of the cervical spine was performed. Multiplanar CT image reconstructions were  also generated.  Comparison: 05/10/2011 neck CT, 07/18/2007 head CT  Findings: Prominence of the sulci, cisterns, and ventricles, in keeping with volume loss. There are subcortical and periventricular white matter hypodensities, a nonspecific finding most often seen with chronic microangiopathic changes.  There is no evidence for acute hemorrhage, overt hydrocephalus, mass lesion, or abnormal extra-axial fluid collection.  No definite CT evidence for acute cortical based (large artery) infarction. Remote appearing lacunar infarction within the right basal ganglia.  The visualized paranasal sinuses and mastoid air cells are predominately clear.  No displaced calvarial fracture.  Cervical spine: Multilevel degenerative change.  No acute fracture or dislocation identified.  No aggressive osseous lesion.  No prevertebral soft tissue swelling.  IMPRESSION: Mild volume loss and white matter changes.  Remote appearing right basal ganglia lacunar infarction.  No definite acute intracranial abnormality.  Multilevel degenerative changes of the cervical spine.  No acute fracture or dislocation identified.  Original Report Authenticated By: Waneta Martins, M.D.   Mr Angiogram Neck W Wo Contrast  05/14/2011  *RADIOLOGY REPORT*  Clinical Data:  Remote CVA.  Seizures.  Recent right carotid endarterectomy.  MRI HEAD WITHOUT AND WITH CONTRAST MRA HEAD WITHOUT CONTRAST MRA NECK WITHOUT AND WITH CONTRAST  Technique:  Multiplanar, multiecho pulse sequences of the brain and surrounding structures were obtained without and with intravenous contrast.  Angiographic images of the Circle of Willis were obtained using MRA technique without intravenous contrast. Angiographic images of the neck were obtained using MRA technique without and with intravenous contrast.  Carotid stenosis measurements (when applicable) are obtained utilizing NASCET criteria, using the distal internal carotid diameter as the denominator.  Contrast: 15mL  MULTIHANCE GADOBENATE DIMEGLUMINE 529 MG/ML IV SOLN  Comparison:  CT head without contrast 05/14/2011.  MRI brain and MRA head without contrast 07/15/2007 at [facility]  MRI HEAD  Findings:  The diffusion weighted images demonstrate no evidence for acute or subacute infarction.  Cortical T2 and FLAIR hyperintensities are present within the posterior right temporal, right parietal, and right occipital lobes.  There is no associated enhancement or hemorrhage.  Periventricular and subcortical white matter disease has progressed since the prior exam.  In particular, there is now a remote appearing lacunar infarct in the right corona radiata.  A remote lacunar infarct is also present in the posterior right putamen. Subcortical white matter lesions have increased in size and number.  The medial temporal lobes are symmetric in size and signal.  Flow is present in the major intracranial arteries.  The globes and orbits are intact.  Mild mucosal thickening is present in the maxillary sinuses.  The paranasal sinuses are otherwise clear. Minimal fluid is present in the mastoid air cells.  No obstructing nasopharyngeal lesion is evident.  IMPRESSION:  1.  Focal cortical and subcortical T2 and FLAIR hyperintensity in the posterior right temporal, occipital, right parietal lobes. This may be postictal.  Focal cerebritis is considered.  There is no hemorrhage or enhancement to suggest venous infarction.  This does not have the typical appearance of a late arterial infarct. This could be an atypical presentation of unilateral PRES.  2.  2.  Interval progression of periventricular white matter disease. 3.  Interval infarcts of the right basal ganglia are not acute.  MRA HEAD  Findings:  Mild irregularity is evident within the cavernous carotid arteries bilaterally without significant stenosis by NASCET criteria.  The A1 and M1 segments are normal.  There is mild attenuation distal MCA branch vessels bilaterally.  No  significant proximal stenosis or occlusion is evident.  The anterior communicating artery is patent.  The right vertebral artery is the dominant vessel.  The PICA origins are visualized and within normal limits bilaterally.  The basilar artery is normal.  Left posterior cerebral artery originates from basilar tip.  The right posterior cerebral artery is of fetal type.  There is mild attenuation of distal PCA branch vessels.  IMPRESSION:  1.  Mild small vessel disease. 2.  No significant proximal stenosis, aneurysm, or branch vessel occlusion.  MRA NECK  Findings: The time-of-flight images demonstrate no significant flow disturbance at either carotid bifurcation.  Flow is antegrade within the vertebral arteries.  Post contrast images demonstrate a standard three-vessel arch configuration.  The vertebral arteries both originate from subclavian arteries.  Signal loss at the origin of the right vertebral artery suggests a moderate stenosis of at least 60 ear 70% relative to the distal vessel.  The left vertebral artery origin is tortuous without focal stenosis.  The right to common carotid arteries within normal limits.  There is a patulous appearance through the right carotid bifurcation, compatible with an endarterectomy.  The distal right internal carotid artery is tortuous without a focal stenosis.  There is signal loss at the origin of the left common carotid artery suggesting stenosis of at least 60% relative to the distal vessel.  The bifurcation is patulous, compatible with the prior endarterectomy.  There is mild focal narrowing at the distal aspect of the endarterectomy, less than 50% relative to the distal vessel.  IMPRESSION:  1.  Moderate to proximal right vertebral artery stenosis relative to the distal vessel. 2.  Status post bilateral endarterectomies without evidence for significant residual or recurrent stenosis. 3.  Mild to moderate proximal left common carotid artery stenosis. 4.  Tortuosity of the  right internal carotid artery without focal stenosis.  Original Report Authenticated By: Jamesetta Orleans. MATTERN, M.D.   Mr Laqueta Jean Wo Contrast  05/14/2011  *RADIOLOGY REPORT*  Clinical Data:  Remote CVA.  Seizures.  Recent right carotid endarterectomy.  MRI HEAD WITHOUT AND WITH CONTRAST MRA HEAD WITHOUT CONTRAST MRA NECK WITHOUT AND WITH CONTRAST  Technique:  Multiplanar, multiecho pulse sequences of the brain and surrounding structures were obtained without and with intravenous contrast.  Angiographic images of the Circle of Willis were obtained using MRA technique without intravenous contrast. Angiographic images of the neck were obtained using MRA technique without and with intravenous contrast.  Carotid stenosis measurements (when applicable) are obtained utilizing NASCET criteria, using the distal internal carotid diameter as the denominator.  Contrast: 15mL MULTIHANCE GADOBENATE DIMEGLUMINE 529 MG/ML IV SOLN  Comparison:  CT head without contrast 05/14/2011.  MRI brain and MRA head without contrast 07/15/2007 at [facility].  MRI HEAD  Findings:  The diffusion weighted images demonstrate no evidence for acute or subacute infarction.  Cortical T2 and FLAIR hyperintensities are present within the posterior right temporal, right parietal, and right occipital lobes.  There is no associated enhancement or hemorrhage.  Periventricular and subcortical white matter disease has progressed since the prior exam.  In particular, there is now a remote appearing lacunar infarct in the right corona radiata.  A remote lacunar infarct is also present in the posterior right putamen. Subcortical white matter lesions  have increased in size and number.  The medial temporal lobes are symmetric in size and signal.  Flow is present in the major intracranial arteries.  The globes and orbits are intact.  Mild mucosal thickening is present in the maxillary sinuses.  The paranasal sinuses are otherwise clear. Minimal fluid is  present in the mastoid air cells.  No obstructing nasopharyngeal lesion is evident.  IMPRESSION:  1.  Focal cortical and subcortical T2 and FLAIR hyperintensity in the posterior right temporal, occipital, right parietal lobes. This may be postictal.  Focal cerebritis is considered.  There is no hemorrhage or enhancement to suggest venous infarction.  This does not have the typical appearance of a late arterial infarct. This could be an atypical presentation of unilateral PRES.  2.  2.  Interval progression of periventricular white matter disease. 3.  Interval infarcts of the right basal ganglia are not acute.  MRA HEAD  Findings: Mild irregularity is evident within the cavernous carotid arteries bilaterally without significant stenosis by NASCET criteria.  The A1 and M1 segments are normal.  There is mild attenuation distal MCA branch vessels bilaterally.  No significant proximal stenosis or occlusion is evident.  The anterior communicating artery is patent.  The right vertebral artery is the dominant vessel.  The PICA origins are visualized and within normal limits bilaterally.  The basilar artery is normal.  Left posterior cerebral artery originates from basilar tip.  The right posterior cerebral artery is of fetal type.  There is mild attenuation of distal PCA branch vessels.  IMPRESSION:  1.  Mild small vessel disease. 2.  No significant proximal stenosis, aneurysm, or branch vessel occlusion.  MRA NECK  Findings: The time-of-flight images demonstrate no significant flow disturbance at either carotid bifurcation.  Flow is antegrade within the vertebral arteries.  Post contrast images demonstrate a standard three-vessel arch configuration.  The vertebral arteries both originate from subclavian arteries.  Signal loss at the origin of the right vertebral artery suggests a moderate stenosis of at least 60 ear 70% relative to the distal vessel.  The left vertebral artery origin is tortuous without focal stenosis.   The right to common carotid arteries within normal limits.  There is a patulous appearance through the right carotid bifurcation, compatible with an endarterectomy.  The distal right internal carotid artery is tortuous without a focal stenosis.  There is signal loss at the origin of the left common carotid artery suggesting stenosis of at least 60% relative to the distal vessel.  The bifurcation is patulous, compatible with the prior endarterectomy.  There is mild focal narrowing at the distal aspect of the endarterectomy, less than 50% relative to the distal vessel.  IMPRESSION:  1.  Moderate to proximal right vertebral artery stenosis relative to the distal vessel. 2.  Status post bilateral endarterectomies without evidence for significant residual or recurrent stenosis. 3.  Mild to moderate proximal left common carotid artery stenosis. 4.  Tortuosity of the right internal carotid artery without focal stenosis.  Original Report Authenticated By: Jamesetta Orleans. MATTERN, M.D.   Dg Chest Port 1 View  05/14/2011  *RADIOLOGY REPORT*  Clinical Data: Shortness of breath  PORTABLE CHEST - 1 VIEW  Comparison: 02/13/2011  Findings: Hypoaeration results in interstitial and vascular crowding.  Status post median sternotomy and CABG.  Heart size upper normal limits to mildly enlarged.  Mild central vascular fullness without overt edema.  No pneumothorax.  No pleural effusion.  No acute osseous abnormality.  IMPRESSION: Hypoaeration results  in interstitial crowding.  No focal consolidation.  Heart size upper normal limits to mildly enlarged, status post median sternotomy and CABG.  Original Report Authenticated By: Waneta Martins, M.D.   Mr Mra Head/brain Wo Cm  05/14/2011  *RADIOLOGY REPORT*  Clinical Data:  Remote CVA.  Seizures.  Recent right carotid endarterectomy.  MRI HEAD WITHOUT AND WITH CONTRAST MRA HEAD WITHOUT CONTRAST MRA NECK WITHOUT AND WITH CONTRAST  Technique:  Multiplanar, multiecho pulse sequences of  the brain and surrounding structures were obtained without and with intravenous contrast.  Angiographic images of the Circle of Willis were obtained using MRA technique without intravenous contrast. Angiographic images of the neck were obtained using MRA technique without and with intravenous contrast.  Carotid stenosis measurements (when applicable) are obtained utilizing NASCET criteria, using the distal internal carotid diameter as the denominator.  Contrast: 15mL MULTIHANCE GADOBENATE DIMEGLUMINE 529 MG/ML IV SOLN  Comparison:  CT head without contrast 05/14/2011.  MRI brain and MRA head without contrast 07/15/2007 at [facility].  MRI HEAD  Findings:  The diffusion weighted images demonstrate no evidence for acute or subacute infarction.  Cortical T2 and FLAIR hyperintensities are present within the posterior right temporal, right parietal, and right occipital lobes.  There is no associated enhancement or hemorrhage.  Periventricular and subcortical white matter disease has progressed since the prior exam.  In particular, there is now a remote appearing lacunar infarct in the right corona radiata.  A remote lacunar infarct is also present in the posterior right putamen. Subcortical white matter lesions have increased in size and number.  The medial temporal lobes are symmetric in size and signal.  Flow is present in the major intracranial arteries.  The globes and orbits are intact.  Mild mucosal thickening is present in the maxillary sinuses.  The paranasal sinuses are otherwise clear. Minimal fluid is present in the mastoid air cells.  No obstructing nasopharyngeal lesion is evident.  IMPRESSION:  1.  Focal cortical and subcortical T2 and FLAIR hyperintensity in the posterior right temporal, occipital, right parietal lobes. This may be postictal.  Focal cerebritis is considered.  There is no hemorrhage or enhancement to suggest venous infarction.  This does not have the typical appearance of a late  arterial infarct. This could be an atypical presentation of unilateral PRES.  2.  2.  Interval progression of periventricular white matter disease. 3.  Interval infarcts of the right basal ganglia are not acute.  MRA HEAD  Findings: Mild irregularity is evident within the cavernous carotid arteries bilaterally without significant stenosis by NASCET criteria.  The A1 and M1 segments are normal.  There is mild attenuation distal MCA branch vessels bilaterally.  No significant proximal stenosis or occlusion is evident.  The anterior communicating artery is patent.  The right vertebral artery is the dominant vessel.  The PICA origins are visualized and within normal limits bilaterally.  The basilar artery is normal.  Left posterior cerebral artery originates from basilar tip.  The right posterior cerebral artery is of fetal type.  There is mild attenuation of distal PCA branch vessels.  IMPRESSION:  1.  Mild small vessel disease. 2.  No significant proximal stenosis, aneurysm, or branch vessel occlusion.  MRA NECK  Findings: The time-of-flight images demonstrate no significant flow disturbance at either carotid bifurcation.  Flow is antegrade within the vertebral arteries.  Post contrast images demonstrate a standard three-vessel arch configuration.  The vertebral arteries both originate from subclavian arteries.  Signal loss at  the origin of the right vertebral artery suggests a moderate stenosis of at least 60 ear 70% relative to the distal vessel.  The left vertebral artery origin is tortuous without focal stenosis.  The right to common carotid arteries within normal limits.  There is a patulous appearance through the right carotid bifurcation, compatible with an endarterectomy.  The distal right internal carotid artery is tortuous without a focal stenosis.  There is signal loss at the origin of the left common carotid artery suggesting stenosis of at least 60% relative to the distal vessel.  The bifurcation is  patulous, compatible with the prior endarterectomy.  There is mild focal narrowing at the distal aspect of the endarterectomy, less than 50% relative to the distal vessel.  IMPRESSION:  1.  Moderate to proximal right vertebral artery stenosis relative to the distal vessel. 2.  Status post bilateral endarterectomies without evidence for significant residual or recurrent stenosis. 3.  Mild to moderate proximal left common carotid artery stenosis. 4.  Tortuosity of the right internal carotid artery without focal stenosis.  Original Report Authenticated By: Jamesetta Orleans. MATTERN, M.D.   Medications: I have reviewed the patient's current medications.  Assessment/Plan: 78 years old man with seizure. Patient had recent carotid endarterectomies b/l.  1) EEG today after which we will sign off - results will not change management currently, but may have implications down the line in decided to modify treatment 2) Cont. Keppra at current dose and follow-up with outpatient neurology 3) Call with questions  LOS: 1 day   [name]

## 2011-05-15 NOTE — Progress Notes (Signed)
   CARE MANAGEMENT NOTE 05/15/2011  Patient:  [name], [name]   Account Number:  [id number]  Date Initiated:  05/14/2011  Documentation initiated by:  Letha Cape  Subjective/Objective Assessment:   dx seizure and assess for cva  admit- lives with spouse.     Action/Plan:   PT EVAL-no pt needs   Anticipated DC Date:  05/15/2011   Anticipated DC Plan:  HOME/SELF CARE      DC Planning Services  CM consult      Choice offered to / List presented to:             Status of service:  Completed, signed off Medicare Important Message given?   (If response is "NO", the following Medicare IM given date fields will be blank) Date Medicare IM given:   Date Additional Medicare IM given:    Discharge Disposition:  HOME/SELF CARE  Per UR Regulation:    If discussed at Long Length of Stay Meetings, dates discussed:    Comments:  05/15/11 11:19 Letha Cape RN, BSN [phone] patient for dc today, per physical therapy , patient has no pt needs.  Patient has medication coverage and transportation.  05/14/11 15:30 Letha Cape RN, BSN [phone] patient lives with spouse, NCM will continue to follow for dc needs.

## 2011-05-15 NOTE — Progress Notes (Signed)
Physical Therapy Evaluation Patient Details Name: [name] MRN: [id number] DOB: August 15, 1931 Today's Date: 05/15/2011  Problem List:  Patient Active Problem List  Diagnoses  . Occlusion and stenosis of carotid artery without mention of cerebral infarction  . Preop cardiovascular exam  . Diabetes mellitus type 2  . CAD (coronary artery disease) of artery bypass graft  . Hyperlipidemia  . Hypertension  . Peripheral vascular disease  . Seizure  . Hypotension, unspecified    Past Medical History:  Past Medical History  Diagnosis Date  . Diabetes mellitus type 2   . GERD (gastroesophageal reflux disease)   . Hyperlipidemia     takes Niacin nightly  . DJD (degenerative joint disease)   . Urosepsis   . Hypertension     takes Metoprolol and Ramipril daily  . Dizziness   . Gout     hx of--63yrs ago  . Itching     on right leg  . Bruises easily     pt is on Plavix and ASA  . Shortness of breath     wakes pt up during night with occ shortness of breathe;states it happens about once a month  . Impaired hearing   . Urinary frequency   . UTI (lower urinary tract infection)     hx of--03/2010  . Anemia     takes Glyburide bid  . CAD (coronary artery disease) of artery bypass graft   . Peripheral vascular disease   . S/P carotid endarterectomy   . Pneumonia     "at least twice that I know of"  . Seizures 05/14/11    "first time was today"  . CVA (cerebral vascular accident) ~ 2009    right arm/leg occ hurts   Past Surgical History:  Past Surgical History  Procedure Date  . Appendectomy 1968  . Colonoscopy   . Endarterectomy 04/09/2011    Procedure: ENDARTERECTOMY CAROTID;  Surgeon: Chuck Hint, MD;  Location: [facility] OR;  Service: Vascular;  Laterality: Left;  Left Carotid endarterectomy With [place]ac[place] Patch Angioplasty  . Cardiac catheterization 1998    dr hochrin  . Endarterectomy 05/08/2011    Procedure: ENDARTERECTOMY CAROTID;  Surgeon: Chuck Hint,  MD;  Location: [facility] OR;  Service: Vascular;  Laterality: Right;  with [place] Patch Angioplasty  . Cataract extraction w/ intraocular lens  implant, bilateral 2012  . Coronary artery bypass graft 1998    LIMA to LAD, SVG to diagonal, SVG to PDA, SVG to posterior lateral.   . Eye surgery 1953    right ; "piece of metal removed"    PT Assessment/Plan/Recommendation PT Assessment Clinical Impression Statement: Pt presents with a medical diagnosis of seizures following a CEA. Pt is at modified independence for all mobility and has no further acute PT needs. PT Recommendation/Assessment: Patent does not need any further PT services No Skilled PT: All education completed;Patient will have necessary level of assist by caregiver at discharge;Patient is modified independent with all activity/mobility PT Recommendation Follow Up Recommendations: No PT follow up Equipment Recommended: None recommended by PT PT Goals     PT Evaluation Precautions/Restrictions  Precautions Precautions: Fall Restrictions Weight Bearing Restrictions: No Prior Functioning  Home Living Lives With: Spouse Receives Help From: Family Type of Home: House Home Layout: One level Home Access: Stairs to enter Entrance Stairs-Rails: Can reach both Entrance Stairs-Number of Steps: 5 Bathroom Shower/Tub: Walk-in shower;Door [employer] Toilet: Standard Bathroom Accessibility: Yes How Accessible: Accessible via walker Home Adaptive Equipment: Straight cane Prior Function  Level of Independence: Independent with basic ADLs;Independent with gait;Independent with transfers;Independent with homemaking with ambulation Able to Take Stairs?: Yes Driving: Yes Vocation: Part time employment Vocation Requirements: Holiday representative Leisure: Hobbies-yes (Comment) Comments: going out to eat Cognition Cognition Arousal/Alertness: Awake/alert Overall Cognitive Status: Appears within functional limits for tasks assessed Orientation  Level: Oriented X4 Sensation/Coordination Sensation Light Touch: Appears Intact Coordination Gross Motor Movements are Fluid and Coordinated: Yes Extremity Assessment RLE Assessment RLE Assessment: Within Functional Limits LLE Assessment LLE Assessment: Within Functional Limits Mobility (including Balance) Bed Mobility Bed Mobility: Yes Supine to Sit: 6: Modified independent (Device/Increase time) Sitting - Scoot to Edge of Bed: 6: Modified independent (Device/Increase time) Sit to Supine: 6: Modified independent (Device/Increase time) Transfers Transfers: Yes Sit to Stand: 6: Modified independent (Device/Increase time) Stand to Sit: 6: Modified independent (Device/Increase time) Ambulation/Gait Ambulation/Gait: Yes Ambulation/Gait Assistance: 5: Supervision Ambulation/Gait Assistance Details (indicate cue type and reason): Supervision for safety. Ambulation Distance (Feet): 200 Feet Assistive device: None Gait Pattern: Within Functional Limits Gait velocity: normal gait speed Stairs: Yes Stairs Assistance: 6: Modified independent (Device/Increase time) Stair Management Technique: Two rails;Forwards Number of Stairs: 4     Exercise    End of Session PT - End of Session Equipment Utilized During Treatment: Gait belt Activity Tolerance: Patient tolerated treatment well Patient left: in bed;with call bell in reach Nurse Communication: Mobility status for transfers;Mobility status for ambulation General Behavior During Session: [facility] for tasks performed Cognition: [facility] for tasks performed  Milana Kidney 05/15/2011, 1:52 PM  05/15/2011 Milana Kidney DPT PAGER: [phone] OFFICE: [phone]

## 2011-05-15 NOTE — Consult Note (Signed)
EEG was normal. Cont. Keppra. Please arrange follow-up with [facility] if possible. Call with questions.  Carmell Austria, MD

## 2011-05-15 NOTE — Discharge Summary (Signed)
Physician Discharge Summary  Patient ID: [name] MRN: [id number] DOB/AGE: [id number] 76 y.o.  Admit date: 05/14/2011 Discharge date: 05/15/2011  Primary Care Physician:  Dolores Hoose, OTR   Discharge Diagnoses:    Principal Problem:  *Seizure Active Problems:  Occlusion and stenosis of carotid artery without mention of cerebral infarction  Diabetes mellitus type 2  Hypertension  Hypotension, unspecified    Medication List  As of 05/15/2011  5:47 PM   STOP taking these medications         clopidogrel 75 MG tablet         TAKE these medications         aspirin EC 325 MG tablet   Take 325 mg by mouth daily.      fish oil-omega-3 fatty acids 1000 MG capsule   Take 1 g by mouth daily.      FLAX SEED OIL PO   Take 1,300 mg by mouth daily.      glyBURIDE 2.5 MG tablet   Commonly known as: DIABETA   Take 2.5 mg by mouth 2 (two) times daily with a meal.      levETIRAcetam 750 MG tablet   Commonly known as: KEPPRA   Take 1 tablet (750 mg total) by mouth every 12 (twelve) hours.      metoprolol 50 MG tablet   Commonly known as: LOPRESSOR   Take 25 mg by mouth 2 (two) times daily.      mulitivitamin with minerals Tabs   Take 1 tablet by mouth daily.      niacin 1000 MG CR tablet   Commonly known as: NIASPAN   Take 1,000 mg by mouth at bedtime.      oxyCODONE 5 MG immediate release tablet   Commonly known as: Oxy IR/ROXICODONE   Take 5 mg by mouth every 4 (four) hours as needed. For pain      ramipril 2.5 MG tablet   Commonly known as: ALTACE   Take 2.5 mg by mouth daily.      simvastatin 20 MG tablet   Commonly known as: ZOCOR   Take 20 mg by mouth at bedtime.      Tamsulosin HCl 0.4 MG Caps   Commonly known as: FLOMAX   Take 0.4 mg by mouth daily.             Disposition and Follow-up:  Will be discharged home today in stable and improved condition. Needs f/u with neurology as an outpatient.  Consults:  neurology Dr.  Lyman Speller   Significant Diagnostic Studies:  Ct Head Wo Contrast  05/14/2011  *RADIOLOGY REPORT*  Clinical Data: Seizure,  CT HEAD WITHOUT CONTRAST,CT CERVICAL SPINE WITHOUT CONTRAST  Technique:  Contiguous axial images were obtained from the base of the skull through the vertex without contrast.,Technique: Multidetector CT imaging of the cervical spine was performed. Multiplanar CT image reconstructions were also generated.  Comparison: 05/10/2011 neck CT, 07/18/2007 head CT  Findings: Prominence of the sulci, cisterns, and ventricles, in keeping with volume loss. There are subcortical and periventricular white matter hypodensities, a nonspecific finding most often seen with chronic microangiopathic changes.  There is no evidence for acute hemorrhage, overt hydrocephalus, mass lesion, or abnormal extra-axial fluid collection.  No definite CT evidence for acute cortical based (large artery) infarction. Remote appearing lacunar infarction within the right basal ganglia.  The visualized paranasal sinuses and mastoid air cells are predominately clear.  No displaced calvarial fracture.  Cervical spine: Multilevel degenerative change.  No acute fracture or dislocation identified.  No aggressive osseous lesion.  No prevertebral soft tissue swelling.  IMPRESSION: Mild volume loss and white matter changes.  Remote appearing right basal ganglia lacunar infarction.  No definite acute intracranial abnormality.  Multilevel degenerative changes of the cervical spine.  No acute fracture or dislocation identified.  Original Report Authenticated By: Waneta Martins, M.D.   Ct Cervical Spine Wo Contrast  05/14/2011  *RADIOLOGY REPORT*  Clinical Data: Seizure,  CT HEAD WITHOUT CONTRAST,CT CERVICAL SPINE WITHOUT CONTRAST  Technique:  Contiguous axial images were obtained from the base of the skull through the vertex without contrast.,Technique: Multidetector CT imaging of the cervical spine was performed. Multiplanar CT image  reconstructions were also generated.  Comparison: 05/10/2011 neck CT, 07/18/2007 head CT  Findings: Prominence of the sulci, cisterns, and ventricles, in keeping with volume loss. There are subcortical and periventricular white matter hypodensities, a nonspecific finding most often seen with chronic microangiopathic changes.  There is no evidence for acute hemorrhage, overt hydrocephalus, mass lesion, or abnormal extra-axial fluid collection.  No definite CT evidence for acute cortical based (large artery) infarction. Remote appearing lacunar infarction within the right basal ganglia.  The visualized paranasal sinuses and mastoid air cells are predominately clear.  No displaced calvarial fracture.  Cervical spine: Multilevel degenerative change.  No acute fracture or dislocation identified.  No aggressive osseous lesion.  No prevertebral soft tissue swelling.  IMPRESSION: Mild volume loss and white matter changes.  Remote appearing right basal ganglia lacunar infarction.  No definite acute intracranial abnormality.  Multilevel degenerative changes of the cervical spine.  No acute fracture or dislocation identified.  Original Report Authenticated By: Waneta Martins, M.D.   Mr Angiogram Neck W Wo Contrast  05/14/2011  *RADIOLOGY REPORT*  Clinical Data:  Remote CVA.  Seizures.  Recent right carotid endarterectomy.  MRI HEAD WITHOUT AND WITH CONTRAST MRA HEAD WITHOUT CONTRAST MRA NECK WITHOUT AND WITH CONTRAST  Technique:  Multiplanar, multiecho pulse sequences of the brain and surrounding structures were obtained without and with intravenous contrast.  Angiographic images of the Circle of Willis were obtained using MRA technique without intravenous contrast. Angiographic images of the neck were obtained using MRA technique without and with intravenous contrast.  Carotid stenosis measurements (when applicable) are obtained utilizing NASCET criteria, using the distal internal carotid diameter as the denominator.   Contrast: 15mL MULTIHANCE GADOBENATE DIMEGLUMINE 529 MG/ML IV SOLN  Comparison:  CT head without contrast 05/14/2011.  MRI brain and MRA head without contrast 07/15/2007 at [facility].  MRI HEAD  Findings:  The diffusion weighted images demonstrate no evidence for acute or subacute infarction.  Cortical T2 and FLAIR hyperintensities are present within the posterior right temporal, right parietal, and right occipital lobes.  There is no associated enhancement or hemorrhage.  Periventricular and subcortical white matter disease has progressed since the prior exam.  In particular, there is now a remote appearing lacunar infarct in the right corona radiata.  A remote lacunar infarct is also present in the posterior right putamen. Subcortical white matter lesions have increased in size and number.  The medial temporal lobes are symmetric in size and signal.  Flow is present in the major intracranial arteries.  The globes and orbits are intact.  Mild mucosal thickening is present in the maxillary sinuses.  The paranasal sinuses are otherwise clear. Minimal fluid is present in the mastoid air cells.  No obstructing nasopharyngeal lesion is evident.  IMPRESSION:  1.  Focal  cortical and subcortical T2 and FLAIR hyperintensity in the posterior right temporal, occipital, right parietal lobes. This may be postictal.  Focal cerebritis is considered.  There is no hemorrhage or enhancement to suggest venous infarction.  This does not have the typical appearance of a late arterial infarct. This could be an atypical presentation of unilateral PRES.  2.  2.  Interval progression of periventricular white matter disease. 3.  Interval infarcts of the right basal ganglia are not acute.  MRA HEAD  Findings: Mild irregularity is evident within the cavernous carotid arteries bilaterally without significant stenosis by NASCET criteria.  The A1 and M1 segments are normal.  There is mild attenuation distal MCA branch vessels  bilaterally.  No significant proximal stenosis or occlusion is evident.  The anterior communicating artery is patent.  The right vertebral artery is the dominant vessel.  The PICA origins are visualized and within normal limits bilaterally.  The basilar artery is normal.  Left posterior cerebral artery originates from basilar tip.  The right posterior cerebral artery is of fetal type.  There is mild attenuation of distal PCA branch vessels.  IMPRESSION:  1.  Mild small vessel disease. 2.  No significant proximal stenosis, aneurysm, or branch vessel occlusion.  MRA NECK  Findings: The time-of-flight images demonstrate no significant flow disturbance at either carotid bifurcation.  Flow is antegrade within the vertebral arteries.  Post contrast images demonstrate a standard three-vessel arch configuration.  The vertebral arteries both originate from subclavian arteries.  Signal loss at the origin of the right vertebral artery suggests a moderate stenosis of at least 60 ear 70% relative to the distal vessel.  The left vertebral artery origin is tortuous without focal stenosis.  The right to common carotid arteries within normal limits.  There is a patulous appearance through the right carotid bifurcation, compatible with an endarterectomy.  The distal right internal carotid artery is tortuous without a focal stenosis.  There is signal loss at the origin of the left common carotid artery suggesting stenosis of at least 60% relative to the distal vessel.  The bifurcation is patulous, compatible with the prior endarterectomy.  There is mild focal narrowing at the distal aspect of the endarterectomy, less than 50% relative to the distal vessel.  IMPRESSION:  1.  Moderate to proximal right vertebral artery stenosis relative to the distal vessel. 2.  Status post bilateral endarterectomies without evidence for significant residual or recurrent stenosis. 3.  Mild to moderate proximal left common carotid artery stenosis. 4.   Tortuosity of the right internal carotid artery without focal stenosis.  Original Report Authenticated By: Jamesetta Orleans. MATTERN, M.D.   Mr Laqueta Jean Wo Contrast  05/14/2011  *RADIOLOGY REPORT*  Clinical Data:  Remote CVA.  Seizures.  Recent right carotid endarterectomy.  MRI HEAD WITHOUT AND WITH CONTRAST MRA HEAD WITHOUT CONTRAST MRA NECK WITHOUT AND WITH CONTRAST  Technique:  Multiplanar, multiecho pulse sequences of the brain and surrounding structures were obtained without and with intravenous contrast.  Angiographic images of the Circle of Willis were obtained using MRA technique without intravenous contrast. Angiographic images of the neck were obtained using MRA technique without and with intravenous contrast.  Carotid stenosis measurements (when applicable) are obtained utilizing NASCET criteria, using the distal internal carotid diameter as the denominator.  Contrast: 15mL MULTIHANCE GADOBENATE DIMEGLUMINE 529 MG/ML IV SOLN  Comparison:  CT head without contrast 05/14/2011.  MRI brain and MRA head without contrast 07/15/2007 at [facility].  MRI HEAD  Findings:  The diffusion weighted images demonstrate no evidence for acute or subacute infarction.  Cortical T2 and FLAIR hyperintensities are present within the posterior right temporal, right parietal, and right occipital lobes.  There is no associated enhancement or hemorrhage.  Periventricular and subcortical white matter disease has progressed since the prior exam.  In particular, there is now a remote appearing lacunar infarct in the right corona radiata.  A remote lacunar infarct is also present in the posterior right putamen. Subcortical white matter lesions have increased in size and number.  The medial temporal lobes are symmetric in size and signal.  Flow is present in the major intracranial arteries.  The globes and orbits are intact.  Mild mucosal thickening is present in the maxillary sinuses.  The paranasal sinuses are otherwise  clear. Minimal fluid is present in the mastoid air cells.  No obstructing nasopharyngeal lesion is evident.  IMPRESSION:  1.  Focal cortical and subcortical T2 and FLAIR hyperintensity in the posterior right temporal, occipital, right parietal lobes. This may be postictal.  Focal cerebritis is considered.  There is no hemorrhage or enhancement to suggest venous infarction.  This does not have the typical appearance of a late arterial infarct. This could be an atypical presentation of unilateral PRES.  2.  2.  Interval progression of periventricular white matter disease. 3.  Interval infarcts of the right basal ganglia are not acute.  MRA HEAD  Findings: Mild irregularity is evident within the cavernous carotid arteries bilaterally without significant stenosis by NASCET criteria.  The A1 and M1 segments are normal.  There is mild attenuation distal MCA branch vessels bilaterally.  No significant proximal stenosis or occlusion is evident.  The anterior communicating artery is patent.  The right vertebral artery is the dominant vessel.  The PICA origins are visualized and within normal limits bilaterally.  The basilar artery is normal.  Left posterior cerebral artery originates from basilar tip.  The right posterior cerebral artery is of fetal type.  There is mild attenuation of distal PCA branch vessels.  IMPRESSION:  1.  Mild small vessel disease. 2.  No significant proximal stenosis, aneurysm, or branch vessel occlusion.  MRA NECK  Findings: The time-of-flight images demonstrate no significant flow disturbance at either carotid bifurcation.  Flow is antegrade within the vertebral arteries.  Post contrast images demonstrate a standard three-vessel arch configuration.  The vertebral arteries both originate from subclavian arteries.  Signal loss at the origin of the right vertebral artery suggests a moderate stenosis of at least 60 ear 70% relative to the distal vessel.  The left vertebral artery origin is tortuous  without focal stenosis.  The right to common carotid arteries within normal limits.  There is a patulous appearance through the right carotid bifurcation, compatible with an endarterectomy.  The distal right internal carotid artery is tortuous without a focal stenosis.  There is signal loss at the origin of the left common carotid artery suggesting stenosis of at least 60% relative to the distal vessel.  The bifurcation is patulous, compatible with the prior endarterectomy.  There is mild focal narrowing at the distal aspect of the endarterectomy, less than 50% relative to the distal vessel.  IMPRESSION:  1.  Moderate to proximal right vertebral artery stenosis relative to the distal vessel. 2.  Status post bilateral endarterectomies without evidence for significant residual or recurrent stenosis. 3.  Mild to moderate proximal left common carotid artery stenosis. 4.  Tortuosity of the right internal carotid artery without  focal stenosis.  Original Report Authenticated By: Jamesetta Orleans. MATTERN, M.D.   Dg Chest Port 1 View  05/14/2011  *RADIOLOGY REPORT*  Clinical Data: Shortness of breath  PORTABLE CHEST - 1 VIEW  Comparison: 02/13/2011  Findings: Hypoaeration results in interstitial and vascular crowding.  Status post median sternotomy and CABG.  Heart size upper normal limits to mildly enlarged.  Mild central vascular fullness without overt edema.  No pneumothorax.  No pleural effusion.  No acute osseous abnormality.  IMPRESSION: Hypoaeration results in interstitial crowding.  No focal consolidation.  Heart size upper normal limits to mildly enlarged, status post median sternotomy and CABG.  Original Report Authenticated By: Waneta Martins, M.D.   Mr Mra Head/brain Wo Cm  05/14/2011  *RADIOLOGY REPORT*  Clinical Data:  Remote CVA.  Seizures.  Recent right carotid endarterectomy.  MRI HEAD WITHOUT AND WITH CONTRAST MRA HEAD WITHOUT CONTRAST MRA NECK WITHOUT AND WITH CONTRAST  Technique:  Multiplanar,  multiecho pulse sequences of the brain and surrounding structures were obtained without and with intravenous contrast.  Angiographic images of the Circle of Willis were obtained using MRA technique without intravenous contrast. Angiographic images of the neck were obtained using MRA technique without and with intravenous contrast.  Carotid stenosis measurements (when applicable) are obtained utilizing NASCET criteria, using the distal internal carotid diameter as the denominator.  Contrast: 15mL MULTIHANCE GADOBENATE DIMEGLUMINE 529 MG/ML IV SOLN  Comparison:  CT head without contrast 05/14/2011.  MRI brain and MRA head without contrast 07/15/2007 at [facility].  MRI HEAD  Findings:  The diffusion weighted images demonstrate no evidence for acute or subacute infarction.  Cortical T2 and FLAIR hyperintensities are present within the posterior right temporal, right parietal, and right occipital lobes.  There is no associated enhancement or hemorrhage.  Periventricular and subcortical white matter disease has progressed since the prior exam.  In particular, there is now a remote appearing lacunar infarct in the right corona radiata.  A remote lacunar infarct is also present in the posterior right putamen. Subcortical white matter lesions have increased in size and number.  The medial temporal lobes are symmetric in size and signal.  Flow is present in the major intracranial arteries.  The globes and orbits are intact.  Mild mucosal thickening is present in the maxillary sinuses.  The paranasal sinuses are otherwise clear. Minimal fluid is present in the mastoid air cells.  No obstructing nasopharyngeal lesion is evident.  IMPRESSION:  1.  Focal cortical and subcortical T2 and FLAIR hyperintensity in the posterior right temporal, occipital, right parietal lobes. This may be postictal.  Focal cerebritis is considered.  There is no hemorrhage or enhancement to suggest venous infarction.  This does not have the  typical appearance of a late arterial infarct. This could be an atypical presentation of unilateral PRES.  2.  2.  Interval progression of periventricular white matter disease. 3.  Interval infarcts of the right basal ganglia are not acute.  MRA HEAD  Findings: Mild irregularity is evident within the cavernous carotid arteries bilaterally without significant stenosis by NASCET criteria.  The A1 and M1 segments are normal.  There is mild attenuation distal MCA branch vessels bilaterally.  No significant proximal stenosis or occlusion is evident.  The anterior communicating artery is patent.  The right vertebral artery is the dominant vessel.  The PICA origins are visualized and within normal limits bilaterally.  The basilar artery is normal.  Left posterior cerebral artery originates from basilar tip.  The right posterior cerebral artery is of fetal type.  There is mild attenuation of distal PCA branch vessels.  IMPRESSION:  1.  Mild small vessel disease. 2.  No significant proximal stenosis, aneurysm, or branch vessel occlusion.  MRA NECK  Findings: The time-of-flight images demonstrate no significant flow disturbance at either carotid bifurcation.  Flow is antegrade within the vertebral arteries.  Post contrast images demonstrate a standard three-vessel arch configuration.  The vertebral arteries both originate from subclavian arteries.  Signal loss at the origin of the right vertebral artery suggests a moderate stenosis of at least 60 ear 70% relative to the distal vessel.  The left vertebral artery origin is tortuous without focal stenosis.  The right to common carotid arteries within normal limits.  There is a patulous appearance through the right carotid bifurcation, compatible with an endarterectomy.  The distal right internal carotid artery is tortuous without a focal stenosis.  There is signal loss at the origin of the left common carotid artery suggesting stenosis of at least 60% relative to the distal  vessel.  The bifurcation is patulous, compatible with the prior endarterectomy.  There is mild focal narrowing at the distal aspect of the endarterectomy, less than 50% relative to the distal vessel.  IMPRESSION:  1.  Moderate to proximal right vertebral artery stenosis relative to the distal vessel. 2.  Status post bilateral endarterectomies without evidence for significant residual or recurrent stenosis. 3.  Mild to moderate proximal left common carotid artery stenosis. 4.  Tortuosity of the right internal carotid artery without focal stenosis.  Original Report Authenticated By: Jamesetta Orleans. MATTERN, M.D.    Brief H and P: For complete details please refer to admission H and P, but in brief patient is a 78 y.o. male with a past medical history of Diabetes mellitus type 2; GERD (gastroesophageal reflux disease); Hyperlipidemia; DJD (degenerative joint disease); Urosepsis; Hypertension; Pneumonia; Dizziness; CVA (cerebral vascular accident) (4+yrs ago); Gout; Itching; Bruises easily; Shortness of breath; Impaired hearing; Urinary frequency; UTI (lower urinary tract infection); Anemia; CAD (coronary artery disease) of artery bypass graft; Peripheral vascular disease; and S/P carotid endarterectomy who presented with a possible seizure episode this am. Of note he had right carotid endarterectomy done on 3/8.  Wife found him on the floor face down having apparent tonic clonic seizure with tongue biting. Wife could not turn him over called EMS. Apparently was able to speak some time after the event. He had one more after EMS arrived and one more in ER. Patient cannot recollect any of this. He received valium by EMS and then was loaded with 1 gm of Keppra in ED. We were asked to admit him for further evaluation and management.       Hospital Course:  Principal Problem:  *Seizure Active Problems:  Occlusion and stenosis of carotid artery without mention of cerebral infarction  Diabetes mellitus type 2   Hypertension  Hypotension, unspecified   #1 Seizure: Started on Keppra 750 mg BID. Seen by neurology. EEG normal. Per them ok to discharge home on current dose of keppra and outpatient followup with neurology. Unclear of relationship between recent CEA and seizure.  Rest of chronic medical issues have been stable this hospitalization and his home medications have not been altered.  Time spent on Discharge: Greater than 30 minutes.  SignedChaya Jan [facility] Pager: [phone] 05/15/2011, 5:47 PM

## 2011-05-15 NOTE — Progress Notes (Signed)
  Echocardiogram 2D Echocardiogram has been performed by [facility] on 05/14/11.  Javae Braaten, Real Cons 05/15/2011, 5:37 PM

## 2011-05-15 NOTE — Evaluation (Signed)
Occupational Therapy Evaluation Patient Details Name: [name] MRN: [id number] DOB: Jul 14, 1931 Today's Date: 05/15/2011  Problem List:  Patient Active Problem List  Diagnoses  . Occlusion and stenosis of carotid artery without mention of cerebral infarction  . Preop cardiovascular exam  . Diabetes mellitus type 2  . CAD (coronary artery disease) of artery bypass graft  . Hyperlipidemia  . Hypertension  . Peripheral vascular disease  . Seizure  . Hypotension, unspecified    Past Medical History:  Past Medical History  Diagnosis Date  . Diabetes mellitus type 2   . GERD (gastroesophageal reflux disease)   . Hyperlipidemia     takes Niacin nightly  . DJD (degenerative joint disease)   . Urosepsis   . Hypertension     takes Metoprolol and Ramipril daily  . Dizziness   . Gout     hx of--24yrs ago  . Itching     on right leg  . Bruises easily     pt is on Plavix and ASA  . Shortness of breath     wakes pt up during night with occ shortness of breathe;states it happens about once a month  . Impaired hearing   . Urinary frequency   . UTI (lower urinary tract infection)     hx of--03/2010  . Anemia     takes Glyburide bid  . CAD (coronary artery disease) of artery bypass graft   . Peripheral vascular disease   . S/P carotid endarterectomy   . Pneumonia     "at least twice that I know of"  . Seizures 05/14/11    "first time was today"  . CVA (cerebral vascular accident) ~ 2009    right arm/leg occ hurts   Past Surgical History:  Past Surgical History  Procedure Date  . Appendectomy 1968  . Colonoscopy   . Endarterectomy 04/09/2011    Procedure: ENDARTERECTOMY CAROTID;  Surgeon: Chuck Hint, MD;  Location: [facility] OR;  Service: Vascular;  Laterality: Left;  Left Carotid endarterectomy With Dacron Patch Angioplasty  . Cardiac catheterization 1998    dr hochrin  . Endarterectomy 05/08/2011    Procedure: ENDARTERECTOMY CAROTID;  Surgeon: Chuck Hint, MD;  Location: [facility] OR;  Service: Vascular;  Laterality: Right;  with Heamashield Patch Angioplasty  . Cataract extraction w/ intraocular lens  implant, bilateral 2012  . Coronary artery bypass graft 1998    LIMA to LAD, SVG to diagonal, SVG to PDA, SVG to posterior lateral.   . Eye surgery 1953    right ; "piece of metal removed"    OT Assessment/Plan/Recommendation OT Assessment Clinical Impression Statement: 78 yo male s/p seizure that currently does not have acute OT needs and no dme recommended. Pt appears and reports being near baseline. Pt does report feeling weak "all over" but "getting there" when provided questioning cues  OT Recommendation/Assessment: Patient does not need any further OT services OT Recommendation Follow Up Recommendations: No OT follow up Equipment Recommended: None recommended by OT OT Goals    OT Evaluation Precautions/Restrictions  Precautions Precautions: Fall Restrictions Weight Bearing Restrictions: No Prior Functioning Home Living Lives With: Spouse Receives Help From: Family Type of Home: House Home Layout: One level Home Access: Stairs to enter Entrance Stairs-Rails: Can reach both Entrance Stairs-Number of Steps: 5 Bathroom Shower/Tub: Walk-in shower;Door [employer] Toilet: Standard Bathroom Accessibility: Yes How Accessible: Accessible via walker Home Adaptive Equipment: Straight cane Prior Function Able to Take Stairs?: Yes Driving: Yes Vocation: Part time employment  Vocation Requirements: Holiday representative Leisure: Hobbies-yes (Comment) Comments: going out to eat ADL ADL Grooming: Performed;Wash/dry hands;Modified independent Where Assessed - Grooming: Standing at sink Toilet Transfer: Performed;Modified independent Toilet Transfer Method: Proofreader: Regular height toilet Toileting - Clothing Manipulation: Performed;Modified independent Where Assessed - Toileting Clothing Manipulation: Sit to  stand from 3-in-1 or toilet Toileting - Hygiene: Performed;Modified independent Where Assessed - Toileting Hygiene: Sit to stand from 3-in-1 or toilet Vision/Perception  Vision - History Baseline Vision: No visual deficits Vision - Assessment Vision Assessment: Vision not tested Cognition Cognition Arousal/Alertness: Awake/alert Overall Cognitive Status: Appears within functional limits for tasks assessed Orientation Level: Oriented X4 Sensation/Coordination Sensation Light Touch: Appears Intact Coordination Gross Motor Movements are Fluid and Coordinated: Yes Fine Motor Movements are Fluid and Coordinated: Yes Extremity Assessment RUE Assessment RUE Assessment: Within Functional Limits LUE Assessment LUE Assessment: Within Functional Limits Mobility  Bed Mobility Bed Mobility: Yes Supine to Sit: 6: Modified independent (Device/Increase time) Sitting - Scoot to Edge of Bed: With rail (with extended time) Sit to Supine: 6: Modified independent (Device/Increase time);With rail Transfers Transfers: Yes Sit to Stand: 6: Modified independent (Device/Increase time) Stand to Sit: 6: Modified independent (Device/Increase time) Exercises   End of Session OT - End of Session Activity Tolerance: Patient tolerated treatment well Patient left: in bed;with call bell in reach (sitting on side of the bed) Nurse Communication: Mobility status for transfers General Behavior During Session: [facility] for tasks performed Cognition: [facility] for tasks performed   Lucile Shutters 05/15/2011, 2:53 PM  Pager: [phone]

## 2011-05-16 LAB — URINE CULTURE

## 2011-05-18 ENCOUNTER — Encounter: Payer: Self-pay | Admitting: Neurology

## 2011-05-18 MED ORDER — DEXTROSE 5 % IV SOLN
1.5000 g | INTRAVENOUS | Status: DC | PRN
  Administered 2011-04-09: 1.5 g via INTRAVENOUS

## 2011-05-18 NOTE — Addendum Note (Signed)
Addendum  created 05/18/11 1610 by Fuller Canada, CRNA   Modules edited:Anesthesia Medication Administration

## 2011-05-18 NOTE — Addendum Note (Signed)
Addendum  created 05/18/11 0942 by Tamsen Reist Bruno Caleb Prigmore, CRNA   Modules edited:Anesthesia Medication Administration    

## 2011-05-19 ENCOUNTER — Encounter: Payer: Self-pay | Admitting: Vascular Surgery

## 2011-05-19 LAB — GLUCOSE, CAPILLARY

## 2011-05-19 NOTE — Discharge Summary (Signed)
Agree with above.  Janin Kozlowski S. Kian Gamarra, MD, FACS Beeper [phone] 05/19/2011  

## 2011-05-20 ENCOUNTER — Ambulatory Visit (INDEPENDENT_AMBULATORY_CARE_PROVIDER_SITE_OTHER): Payer: Medicare Other | Admitting: Vascular Surgery

## 2011-05-20 ENCOUNTER — Encounter: Payer: Self-pay | Admitting: Vascular Surgery

## 2011-05-20 VITALS — BP 134/59 | HR 85 | Resp 16 | Ht 71.0 in | Wt 197.9 lb

## 2011-05-20 DIAGNOSIS — I6529 Occlusion and stenosis of unspecified carotid artery: Secondary | ICD-10-CM

## 2011-05-20 NOTE — Progress Notes (Signed)
 [facility]  Patient name: [name] MRN: [id number] DOB: Jun 14, 1931 Sex: male  REASON FOR VISIT: follow up after staged bilateral carotid endarterectomies.  HPI: [name] is a 78 y.o. male who had presented with severe bilateral carotid stenoses. He underwent a left carotid endarterectomy  In February of this year and did well postoperatively. He presented approximately a month later for a staged right carotid endarterectomy was which was performed on 05/08/2011. He did well after that was discharged home. He returned on 05/14/2011 after he had a seizure at home. CT scan showed no evidence of stroke or bleed. He is followed by neurology and has had no further seizures. Gets and headaches around the time of his seizures but his headaches have resolved. He's had no focal weakness or paresthesias. His had no expressive or receptive aphasia. He's had no amaurosis fugax.  REVIEW OF SYSTEMS: Arly.Keller ] denotes positive finding; [  ] denotes negative finding  CARDIOVASCULAR:  [ ]  chest pain   [ ]  chest pressure   [ ]  palpitations   [ ]  orthopnea  NEUROLOGIC:   [ ]  weakness  [ ]  paresthesias  [ ]  aphasia  [ ]  amaurosis  [ ]  dizziness CONSTITUTIONAL:  [ ]  fever   [ ]  chills  PHYSICAL EXAM: Filed Vitals:   05/20/11 1510  BP: 134/59  Pulse: 85  Resp: 16  Height: 5\' 11"  (1.803 m)  Weight: 197 lb 14.4 oz (89.767 kg)   Body mass index is 27.60 kg/(m^2). GENERAL: The patient is a well-nourished male, in no acute distress. The vital signs are documented above. CARDIOVASCULAR: There is a regular rate and rhythm without significant murmur appreciated. I do not detect carotid bruits. PULMONARY: There is good air exchange bilaterally without wheezing or rales. Both neck incisions are healing nicely. NEUROLOGIC: No focal weakness or paresthesias are detected. SKIN: There are no ulcers or rashes noted.  MEDICAL ISSUES: The patient is doing well status post  staged bilateral carotid endarterectomies. He's been followed for seizures by neurology. I've ordered a fall carotid duplex scan in 6 months and I'll see him back at that time. He is to call sooner if he has problems. In the meantime he is to continue taking his aspirin.  , S [facility] [place] Beeper: [phone]

## 2011-06-01 NOTE — Procedures (Signed)
EEG NUMBER:  [id number].  HISTORY:  This is a 78 years old man with epilepsy.  MEDICATIONS:  Keppra.  CONDITION OF RECORDING:  This 16-lead EEG was recorded with the patient in awake and drowsy states.  Background rhythms: background patterns in wakefulness were well organized with a well-sustained posterior dominant rhythm of 9 to 10 Hz, symmetrical and reactive to eye opening and closing.  Drowsiness was associated with mild attenuation of voltage and slowing of frequencies.  Abnormal potentials: no epileptiform activity or focal slowing was noted.  ACTIVATION PROCEDURES:  Hyperventilation was not performed.  Photic stimulation was not performed.  EKG:  Single-channel of EKG monitoring was unremarkable.  IMPRESSION:  This was an normal awake and drowsy EEG.  A normal EEG does not rule out the clinical diagnosis of epilepsy.  If clinically warranted, a repeat extended EEG or ambulatory requiring may be obtained for prolonged recording times and sleep capture, which may increase diagnostic yield.  Clinical correlation is suggested.          ______________________________ Carmell Austria, MD    [id number] D:  05/15/2011 13:04:12  T:  05/15/2011 23:10:55  Job #:  [id number]

## 2011-07-10 ENCOUNTER — Ambulatory Visit (INDEPENDENT_AMBULATORY_CARE_PROVIDER_SITE_OTHER): Payer: Medicare Other | Admitting: Neurology

## 2011-07-10 ENCOUNTER — Encounter: Payer: Self-pay | Admitting: Neurology

## 2011-07-10 VITALS — BP 144/62 | HR 68 | Wt 199.0 lb

## 2011-07-10 DIAGNOSIS — R569 Unspecified convulsions: Secondary | ICD-10-CM

## 2011-07-10 MED ORDER — LEVETIRACETAM 750 MG PO TABS: 750.0000 mg | ORAL_TABLET | Freq: Two times a day (BID) | ORAL | Status: DC

## 2011-07-10 NOTE — Progress Notes (Signed)
Dear Dr. Bevelyn Buckles,  Thank you for having me see [name] in consultation today at [facility] for his problem with a history of possible seizures.  As you may recall, he is a 78 y.o. year old male with a history of bilateral severe carotid stenosis s/p bilateral CEA who May 14, 2011 had two seizures in succession the first coming on while sleeping.  He did not know it occurred and only remembers waking up in the hospital.  He had had a right sided CEA on March 8 which had been complicated by some "swelling of the neck" which necessitated him visiting the ED a day of discharge.  It appears that no intervention was performed for the swelling.  He did not complain of severe headache before his seizure.  He was other wise well.  In hospital, after the 2 seizures he had a normal EEG and an MRI brain revealed focal cortical T2 hyperintensity in the posterior right temporal, occipital and parietal lobes.  He was placed on Keppra 750 bid.  He had significant problems with confusion and sedation for 2 weeks after his event.  He was also started on a "pain medication" although the actual medication he does not know.    He has gradually returned to normal.  He has not had any further seizures or other spells.  Past Medical History  Diagnosis Date  . Diabetes mellitus type 2   . GERD (gastroesophageal reflux disease)   . Hyperlipidemia     takes Niacin nightly  . DJD (degenerative joint disease)   . Urosepsis   . Hypertension     takes Metoprolol and Ramipril daily  . Dizziness   . Gout     hx of--44yrs ago  . Itching     on right leg  . Bruises easily     pt is on Plavix and ASA  . Shortness of breath     wakes pt up during night with occ shortness of breathe;states it happens about once a month  . Impaired hearing   . Urinary frequency   . UTI (lower urinary tract infection)     hx of--03/2010  . Anemia     takes Glyburide bid  . CAD (coronary artery disease) of artery  bypass graft   . Peripheral vascular disease   . S/P carotid endarterectomy   . Pneumonia     "at least twice that I know of"  . Seizures 05/14/11    "first time was today"  . CVA (cerebral vascular accident) ~ 2009    right arm/leg occ hurts  - he also has a history of a stroke 4 years ago.  Past Surgical History  Procedure Date  . Appendectomy 1968  . Colonoscopy   . Endarterectomy 04/09/2011    Procedure: ENDARTERECTOMY CAROTID;  Surgeon: Chuck Hint, MD;  Location: [facility] OR;  Service: Vascular;  Laterality: Left;  Left Carotid endarterectomy With Dacron Patch Angioplasty  . Cardiac catheterization 1998    dr hochrin  . Endarterectomy 05/08/2011    Procedure: ENDARTERECTOMY CAROTID;  Surgeon: Chuck Hint, MD;  Location: [facility] OR;  Service: Vascular;  Laterality: Right;  with Heamashield Patch Angioplasty  . Cataract extraction w/ intraocular lens  implant, bilateral 2012  . Coronary artery bypass graft 1998    LIMA to LAD, SVG to diagonal, SVG to PDA, SVG to posterior lateral.   . Eye surgery 1953    right ; "piece of metal removed"  History   Social History  . Marital Status: Married    Spouse Name: N/A    Number of Children: N/A  . Years of Education: N/A   Social History Main Topics  . Smoking status: Former Smoker -- 1.0 packs/day for 20 years    Types: Cigarettes    Quit date: 03/02/1974  . Smokeless tobacco: Never Used  . Alcohol Use: No     quit in 1981  . Drug Use: No  . Sexually Active: No   Other Topics Concern  . None   Social History Narrative  . None    Family History  Problem Relation Age of Onset  . Diabetes Sister   . Cancer Brother   . Diabetes Brother   . Anesthesia problems Neg Hx   . Hypotension Neg Hx   . Malignant hyperthermia Neg Hx   . Pseudochol deficiency Neg Hx     Current Outpatient Prescriptions on File Prior to Visit  Medication Sig Dispense Refill  . aspirin EC 325 MG tablet Take 325 mg by mouth daily.        . fish oil-omega-3 fatty acids 1000 MG capsule Take 1 g by mouth daily.       . Flaxseed, Linseed, (FLAX SEED OIL PO) Take 1,300 mg by mouth daily.       Marland Kitchen glyBURIDE (DIABETA) 2.5 MG tablet Take 2.5 mg by mouth 2 (two) times daily with a meal.       . metoprolol (LOPRESSOR) 50 MG tablet Take 25 mg by mouth 2 (two) times daily.       . Multiple Vitamin (MULITIVITAMIN WITH MINERALS) TABS Take 1 tablet by mouth daily.      . niacin (NIASPAN) 1000 MG CR tablet Take 1,000 mg by mouth at bedtime.       . ramipril (ALTACE) 2.5 MG tablet Take 2.5 mg by mouth daily.       . simvastatin (ZOCOR) 20 MG tablet Take 20 mg by mouth at bedtime.       . Tamsulosin HCl (FLOMAX) 0.4 MG CAPS Take 0.4 mg by mouth daily.      Marland Kitchen DISCONTD: levETIRAcetam (KEPPRA) 750 MG tablet Take 1 tablet (750 mg total) by mouth every 12 (twelve) hours.  60 tablet  2    Allergies  Allergen Reactions  . Fexofenadine Hcl Other (See Comments)    Heart attack symptoms  . Flonase (Fluticasone Propionate) Other (See Comments)    Heart attack symptoms  . Nasal Spray Other (See Comments)    Heart attack symptoms       ROS:  13 systems were reviewed and  are unremarkable.   Examination:  Filed Vitals:   07/10/11 0815  BP: 144/62  Pulse: 68  Weight: 199 lb (90.266 kg)     In general, well appearing man.  Cardiovascular: The patient has  no carotid bruits.  Fundoscopy:  Disks are flat. Vessel caliber within normal limits.  Mental status:   The patient is oriented to person, place and time. Recent and remote memory are intact. Attention span and concentration are normal. Language including repetition, naming, following commands are intact. Fund of knowledge of current and historical events, as well as vocabulary are normal.  Cranial Nerves: Pupils are equally round and reactive to light. Visual fields full to confrontation. Extraocular movements are intact without nystagmus. Facial sensation and muscles of  mastication are intact. Muscles of facial expression are symmetric. Hearing intact to bilateral finger rub. Tongue protrusion, uvula,  palate midline.  Shoulder shrug intact  Motor:  The patient has normal bulk and tone, no pronator drift.  There are no adventitious movements.  5/5 muscle strength bilaterally.  Reflexes:  Mute throughout  Toes down  Coordination:  Normal finger to nose.  No dysdiadokinesia.  Sensation is symmetric, no neglect.  Gait and Station are normal.  Romberg is negative   MRI Brain was reviewed from 05/14/2011.  This revealed old right putamen and right CR chronic lacunar infarct.  There was swelling of the right temporal, right parietal and right occipital lobes.  A repeat MRI that I ordered and was complete 5/10 revealed resolution of these changes.  It also revealed resolution of some subcortical white matter changes.  Impression/Recs:   1.  Seizure - likely secondary to hyperperfusion injury after right sided carotid endart.  The changes that were seen on MRI, in particular some of the subcortical changes were also characteristic for that.  Their resolution further supports that mechanism. It was the hyperperfusion injury that likely caused his seizure.  I would continue his AED for six months, then get an EEG and then stop his AED if the EEG was negative. 2.  Carotid endart/stroke - he is on aspirin(325mg  bid) and Plavix.  I have told him to reduce his aspirin to 325mg  daily and then stop it 3 months from his surgery.  He should continue to stay on the Plavix of course.    We will see the patient back in 4 months.  Thank you for having [place] see [name] Carina Buchanan in consultation.  Feel free to contact me with any questions.  Lupita Raider Modesto Charon, MD [facility]rology, [place] [address] [place], [place] [address] Phone: [phone] Fax: [phone].

## 2011-07-10 NOTE — Patient Instructions (Signed)
Your MRI is scheduled for Saturday, May 11th at 2:00pm.   Please arrive to [facility] MRI by 1:45pm.  [phone].  We will contact you to schedule your four month follow up in September.

## 2011-07-11 ENCOUNTER — Ambulatory Visit (HOSPITAL_COMMUNITY)
Admission: RE | Admit: 2011-07-11 | Discharge: 2011-07-11 | Disposition: A | Discharge status: Home / Self Care | Payer: Medicare Other | Source: Ambulatory Visit | Attending: Neurology | Admitting: Neurology

## 2011-07-11 DIAGNOSIS — Z8673 Personal history of transient ischemic attack (TIA), and cerebral infarction without residual deficits: Secondary | ICD-10-CM | POA: Insufficient documentation

## 2011-07-11 DIAGNOSIS — R569 Unspecified convulsions: Secondary | ICD-10-CM

## 2011-07-21 ENCOUNTER — Telehealth: Payer: Self-pay | Admitting: Neurology

## 2011-07-21 NOTE — Telephone Encounter (Signed)
Called and spoke with the patient. Information given as per Dr. Modesto Charon below. The patient will be seen in f/u in September. No questions or concerns voiced at this time.

## 2011-07-21 NOTE — Telephone Encounter (Signed)
Message copied by Benay Spice on Tue Jul 21, 2011  9:14 AM ------      Message from: Milas Gain      Created: Fri Jul 17, 2011 11:03 AM       Let patient know that the changes that were seen on MRI originally when he had the seizures are now gone.  I think those changes were related to his carotid endartectomy that he had several days before.

## 2011-11-12 ENCOUNTER — Ambulatory Visit (INDEPENDENT_AMBULATORY_CARE_PROVIDER_SITE_OTHER): Payer: Medicare Other | Admitting: Neurology

## 2011-11-12 ENCOUNTER — Encounter: Payer: Self-pay | Admitting: Neurology

## 2011-11-12 VITALS — BP 140/70 | HR 74 | Wt 204.0 lb

## 2011-11-12 DIAGNOSIS — R569 Unspecified convulsions: Secondary | ICD-10-CM

## 2011-11-12 NOTE — Progress Notes (Signed)
Dear Dr. Bevelyn Buckles,  Thank you for having me see [name] in followup today at [facility] for his problem with a history of possible seizures.  As you may recall, he is a 78 y.o. year old male with a history of bilateral severe carotid stenosis s/p bilateral CEA who May 14, 2011 had two seizures in succession the first coming on while sleeping.  He did not know it occurred and only remembers waking up in the hospital.  He had had a right sided CEA on March 8 which had been complicated by some "swelling of the neck" which necessitated him visiting the ED a day of discharge.  It appears that no intervention was performed for the swelling.  He did not complain of severe headache before his seizure.  He was other wise well.  In hospital, after the 2 seizures he had a normal EEG and an MRI brain revealed focal cortical T2 hyperintensity in the posterior right temporal, occipital and parietal lobes.  He was placed on Keppra 750 bid.  He had significant problems with confusion and sedation for 2 weeks after his event.  He was also started on a "pain medication" although the actual medication he does not know.    He has gradually returned to normal.  He has not had any further seizures or other spells.  --------------------------  Since I last saw him as outlined above he has had no further seizures. At that time I felt his seizure was an acute issue due to hyperperfusion injury sometimes seen in CEAs.  He has significant agitation from the Keppra however.  He has had no other neurologic events. Current Outpatient Prescriptions on File Prior to Visit  Medication Sig Dispense Refill  . aspirin EC 325 MG tablet Take 325 mg by mouth daily.       . fish oil-omega-3 fatty acids 1000 MG capsule Take 1 g by mouth daily.       . Flaxseed, Linseed, (FLAX SEED OIL PO) Take 1,300 mg by mouth daily.       Marland Kitchen glyBURIDE (DIABETA) 2.5 MG tablet Take 2.5 mg by mouth 2 (two) times daily with a meal.         . levETIRAcetam (KEPPRA) 750 MG tablet Take 1 tablet (750 mg total) by mouth every 12 (twelve) hours.  60 tablet  6  . metoprolol (LOPRESSOR) 50 MG tablet Take 25 mg by mouth 2 (two) times daily.       . Multiple Vitamin (MULITIVITAMIN WITH MINERALS) TABS Take 1 tablet by mouth daily.      . niacin (NIASPAN) 1000 MG CR tablet Take 1,000 mg by mouth at bedtime.       . ramipril (ALTACE) 2.5 MG tablet Take 2.5 mg by mouth daily.       . simvastatin (ZOCOR) 20 MG tablet Take 20 mg by mouth at bedtime.       . Tamsulosin HCl (FLOMAX) 0.4 MG CAPS Take 0.4 mg by mouth daily.         Allergies  Allergen Reactions  . Fexofenadine Hcl Other (See Comments)    Heart attack symptoms  . Flonase (Fluticasone Propionate) Other (See Comments)    Heart attack symptoms  . Nasal Spray Other (See Comments)    Heart attack symptoms       ROS:  13 systems were reviewed and  are unremarkable.   Examination:  Filed Vitals:   11/12/11 0824  BP: 140/70  Pulse: 74  Weight:  204 lb (92.534 kg)     In general, well appearing man.   Cranial Nerves: Pupils are equally round and reactive to light. Visual fields full to confrontation. Extraocular movements are intact without nystagmus. Facial sensation and muscles of mastication are intact. Muscles of facial expression are symmetric. Hearing intact to bilateral finger rub. Tongue protrusion, uvula, palate midline.  Shoulder shrug intact  Motor:  The patient has normal bulk and tone, no pronator drift.  There are no adventitious movements.  5/5 muscle strength bilaterally.  Reflexes:  Mute throughout  Toes down  Coordination:  Normal finger to nose.  No dysdiadokinesia.  Sensation is symmetric, no neglect.  Gait and Station are normal.  Romberg is negative   MRI Brain was reviewed from 05/14/2011.  This revealed old right putamen and right CR chronic lacunar infarct.  There was swelling of the right temporal, right parietal and right occipital  lobes.  A repeat MRI that I ordered and was complete 5/10 revealed resolution of these changes.  It also revealed resolution of some subcortical white matter changes.  Impression/Recs:   1.  Seizure - likely secondary to hyperperfusion injury after right sided carotid endart.  I think he can stop his Keppra after being six months seizure free.  We will get an EEG that Dr. Arbutus Leas can read to ensure its normality.  If there are no IEDs then a slow withdrawal of Keppra would be ok.  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 2.  Carotid endart/stroke - I note that he is on asprin 325mg  daily.  We will see the patient back in 3 weeks.  Thank you for having [place] see [name] in consultation.  Feel free to contact me with any questions.  Lupita Raider Modesto Charon, MD [facility]rology, [place] [address] [place], [place] [address] Phone: [phone] Fax: [phone].

## 2011-11-12 NOTE — Patient Instructions (Addendum)
Your EEG is scheduled at [facility] on Tuesday, November 17, 2011 at 2:30 pm. Please arrive at first floor admitting fifteen minutes prior to your appointment.   [phone].

## 2011-11-17 ENCOUNTER — Ambulatory Visit (HOSPITAL_COMMUNITY)
Admission: RE | Admit: 2011-11-17 | Discharge: 2011-11-17 | Disposition: A | Discharge status: Home / Self Care | Payer: Medicare Other | Source: Ambulatory Visit | Attending: Neurology | Admitting: Neurology

## 2011-11-17 DIAGNOSIS — Z1389 Encounter for screening for other disorder: Secondary | ICD-10-CM | POA: Insufficient documentation

## 2011-11-17 DIAGNOSIS — R569 Unspecified convulsions: Secondary | ICD-10-CM | POA: Insufficient documentation

## 2011-11-17 NOTE — Progress Notes (Signed)
EEG completed as ordered outpatient

## 2011-11-18 NOTE — Procedures (Signed)
EEG NUMBER:  HISTORY:  A 78 year old male with history of seizures.  MEDICATIONS:  Flomax, aspirin, fish oil, flaxseed, DiaBeta, Keppra, Lopressor, Niaspan, Altace, Zocor.  CONDITIONS OF RECORDING:  This is a 16 channel EEG carried out with the patient in the awake, drowsy, and asleep state.  DESCRIPTION:  The waking background activity consists of a low-voltage, symmetrical, fairly well-organized, 9 Hz alpha activity seen from the parieto-occipital and posterior temporal regions.  Low voltage, fast activity poorly organized is seen anteriorly, and at times superimposed on more posterior rhythms.  A mixture of theta and alpha was seen from the central and temporal regions.  The patient drowses with slowing to irregular, low-voltage theta and beta activity.  The patient goes into a light sleep with symmetrical sleep spindles.  The vertex was a sharp activity and irregular slow activity.  Hyperventilation was performed and produced a mild buildup but failed to elicit any abnormalities. Intermittent photic stimulation was performed but failed to elicit any change in the tracing.  IMPRESSION:  This is a normal EEG.          ______________________________ Thana Farr, MD    [id number] D:  11/18/2011 03:17:02  T:  11/18/2011 05:17:13  Job #:  [id number]

## 2011-11-24 ENCOUNTER — Encounter: Payer: Self-pay | Admitting: Neurosurgery

## 2011-11-25 ENCOUNTER — Ambulatory Visit (INDEPENDENT_AMBULATORY_CARE_PROVIDER_SITE_OTHER): Payer: Medicare Other | Admitting: Vascular Surgery

## 2011-11-25 ENCOUNTER — Encounter: Payer: Self-pay | Admitting: Neurosurgery

## 2011-11-25 ENCOUNTER — Ambulatory Visit (INDEPENDENT_AMBULATORY_CARE_PROVIDER_SITE_OTHER): Payer: Medicare Other | Admitting: Neurosurgery

## 2011-11-25 VITALS — BP 145/69 | HR 58 | Resp 18 | Ht 72.0 in | Wt 206.9 lb

## 2011-11-25 DIAGNOSIS — Z48812 Encounter for surgical aftercare following surgery on the circulatory system: Secondary | ICD-10-CM

## 2011-11-25 DIAGNOSIS — I6529 Occlusion and stenosis of unspecified carotid artery: Secondary | ICD-10-CM

## 2011-11-25 NOTE — Addendum Note (Signed)
Addended by: Sharee Pimple on: 11/25/2011 02:43 PM   Modules accepted: Orders

## 2011-11-25 NOTE — Progress Notes (Signed)
Carotid duplex performed @ [facility] 11/25/2011

## 2011-11-25 NOTE — Progress Notes (Signed)
[facility] Carotid Office Note  CC: Six-month carotid surveillance Referring Physician: Edilia Bo  History of Present Illness: 78 year old male patient of Dr. Edilia Bo is status post bilateral CEAs, left in February 2013, right in March 2013. The patient denies any signs or symptoms of CVA, TIA, amaurosis fugax or any neural deficit. The patient denies any new medical diagnoses or recent surgery.  Past Medical History  Diagnosis Date  . Diabetes mellitus type 2   . GERD (gastroesophageal reflux disease)   . Hyperlipidemia     takes Niacin nightly  . DJD (degenerative joint disease)   . Urosepsis   . Hypertension     takes Metoprolol and Ramipril daily  . Dizziness   . Gout     hx of--64yrs ago  . Itching     on right leg  . Bruises easily     pt is on Plavix and ASA  . Shortness of breath     wakes pt up during night with occ shortness of breathe;states it happens about once a month  . Impaired hearing   . Urinary frequency   . UTI (lower urinary tract infection)     hx of--03/2010  . Anemia     takes Glyburide bid  . CAD (coronary artery disease) of artery bypass graft   . Peripheral vascular disease   . S/P carotid endarterectomy   . Pneumonia     "at least twice that I know of"  . Seizures 05/14/11    "first time was today"  . CVA (cerebral vascular accident) ~ 2009    right arm/leg occ hurts  . Carotid artery occlusion     ROS: [x]  Positive   [ ]  Denies    General: [ ]  Weight loss, [ ]  Fever, [ ]  chills Neurologic: [ ]  Dizziness, [ ]  Blackouts, [ ]  Seizure [ ]  Stroke, [ ]  "Mini stroke", [ ]  Slurred speech, [ ]  Temporary blindness; [ ]  weakness in arms or legs, [ ]  Hoarseness Cardiac: [ ]  Chest pain/pressure, [ ]  Shortness of breath at rest [ ]  Shortness of breath with exertion, [ ]  Atrial fibrillation or irregular heartbeat Vascular: [ ]  Pain in legs with walking, [ ]  Pain in legs at rest, [ ]  Pain in legs at night,  [ ]  Non-healing  ulcer, [ ]  Blood clot in vein/DVT,   Pulmonary: [ ]  Home oxygen, [ ]  Productive cough, [ ]  Coughing up blood, [ ]  Asthma,  [ ]  Wheezing Musculoskeletal:  [ ]  Arthritis, [ ]  Low back pain, [ ]  Joint pain Hematologic: [ ]  Easy Bruising, [ ]  Anemia; [ ]  Hepatitis Gastrointestinal: [ ]  Blood in stool, [ ]  Gastroesophageal Reflux/heartburn, [ ]  Trouble swallowing Urinary: [ ]  chronic Kidney disease, [ ]  on HD - [ ]  MWF or [ ]  TTHS, [ ]  Burning with urination, [ ]  Difficulty urinating Skin: [ ]  Rashes, [ ]  Wounds Psychological: [ ]  Anxiety, [ ]  Depression   Social History History  Substance Use Topics  . Smoking status: Former Smoker -- 1.0 packs/day for 20 years    Types: Cigarettes    Quit date: 03/02/1974  . Smokeless tobacco: Never Used  . Alcohol Use: No     quit in 1981    Family History Family History  Problem Relation Age of Onset  . Diabetes Sister   . Cancer Brother   . Diabetes Brother   . Anesthesia problems Neg Hx   . Hypotension Neg Hx   .  Malignant hyperthermia Neg Hx   . Pseudochol deficiency Neg Hx   . Hearing loss Father     Allergies  Allergen Reactions  . Fexofenadine Hcl Other (See Comments)    Heart attack symptoms  . Flonase (Fluticasone Propionate) Other (See Comments)    Heart attack symptoms  . Nasal Spray Other (See Comments)    Heart attack symptoms     Current Outpatient Prescriptions  Medication Sig Dispense Refill  . aspirin EC 325 MG tablet Take 325 mg by mouth daily.       . Cholecalciferol (VITAMIN D-3 PO) Take 600 mg by mouth daily.      . fish oil-omega-3 fatty acids 1000 MG capsule Take 1 g by mouth daily.       . Flaxseed, Linseed, (FLAX SEED OIL PO) Take 1,300 mg by mouth daily.       Marland Kitchen glyBURIDE (DIABETA) 2.5 MG tablet Take 2.5 mg by mouth 2 (two) times daily with a meal.       . levETIRAcetam (KEPPRA) 750 MG tablet Take 1 tablet (750 mg total) by mouth every 12 (twelve) hours.  60 tablet  6  . metoprolol (LOPRESSOR) 50 MG tablet  Take 25 mg by mouth 2 (two) times daily.       . Multiple Vitamin (MULITIVITAMIN WITH MINERALS) TABS Take 1 tablet by mouth daily.      . niacin (NIASPAN) 1000 MG CR tablet Take 1,000 mg by mouth at bedtime.       . ramipril (ALTACE) 2.5 MG tablet Take 2.5 mg by mouth daily.       . simvastatin (ZOCOR) 20 MG tablet Take 20 mg by mouth at bedtime.       . Tamsulosin HCl (FLOMAX) 0.4 MG CAPS Take 0.4 mg by mouth daily.        Physical Examination  Filed Vitals:   11/25/11 1049  BP: 145/69  Pulse: 58  Resp:     Body mass index is 28.06 kg/(m^2).  General:  WDWN in NAD Gait: Normal HEENT: WNL Eyes: Pupils equal Pulmonary: normal non-labored breathing , without Rales, rhonchi,  wheezing Cardiac: RRR, without  Murmurs, rubs or gallops; Abdomen: soft, NT, no masses Skin: no rashes, ulcers noted  Vascular Exam Pulses: 3+ radial pulses bilaterally Carotid bruits: Carotid pulses to auscultation no bruits are heard Extremities without ischemic changes, no Gangrene , no cellulitis; no open wounds;  Musculoskeletal: no muscle wasting or atrophy   Neurologic: A&O X 3; Appropriate Affect ; SENSATION: normal; MOTOR FUNCTION:  moving all extremities equally. Speech is fluent/normal  Non-Invasive Vascular Imaging CAROTID DUPLEX 11/25/2011  Right ICA 0 - 19% stenosis Left ICA 0 - 19% stenosis   ASSESSMENT/PLAN: Asymptomatic patient status post bilateral CEAs earlier this year. The patient will followup in 6 months for repeat carotid duplex, his questions were encouraged and answered, he is in agreement with this plan.  Lauree Chandler ANP   Clinic MD: Edilia Bo

## 2011-12-07 ENCOUNTER — Encounter: Payer: Self-pay | Admitting: Neurology

## 2011-12-07 ENCOUNTER — Ambulatory Visit (INDEPENDENT_AMBULATORY_CARE_PROVIDER_SITE_OTHER): Payer: Medicare Other | Admitting: Neurology

## 2011-12-07 VITALS — BP 142/60 | HR 70 | Temp 97.8°F | Resp 16 | Ht 71.0 in | Wt 210.0 lb

## 2011-12-07 DIAGNOSIS — R569 Unspecified convulsions: Secondary | ICD-10-CM

## 2011-12-07 NOTE — Progress Notes (Signed)
[name] was seen today in neurologic consultation at the request of Allie Dimmer, OTR and Meryle Ready.  Since Dr. Modesto Charon is no longer at the practice, I am taking over the patient's neurologic care.  I had the opportunity to review Dr. Nash Dimmer previous notes.  The patient is accompanied by his wife, who supplements the history.  The patient is a 79 y.o. year old male with a history of seizure.  The patient has a history of bilateral carotid endarterectomies, the first being in February, 2013 and the second being on 05/08/2011.  The patient had 2 seizures on 05/14/2011.  It was felt that this was likely secondary to hyperperfusion injury.  The patient subsequently had an MRI of the brain, demonstrating T2 hyperintensities in the right occipital, temporal and parietal region.  His EEG was negative.  His EEG was repeated on 11/17/2011 and this was also normal.  He is currently on Keppra, 750 mg twice a day.  There've been no side effects with this medication except it seems to make him very drowsy.  There've been no further seizures.    ALLERGIES:   Allergies  Allergen Reactions  . Fexofenadine Hcl Other (See Comments)    Heart attack symptoms  . Flonase (Fluticasone Propionate) Other (See Comments)    Heart attack symptoms  . Nasal Spray Other (See Comments)    Heart attack symptoms     CURRENT MEDICATIONS:  Current Outpatient Prescriptions on File Prior to Visit  Medication Sig Dispense Refill  . aspirin EC 325 MG tablet Take 325 mg by mouth daily.       . Cholecalciferol (VITAMIN D-3 PO) Take 600 mg by mouth daily.      . fish oil-omega-3 fatty acids 1000 MG capsule Take 1 g by mouth daily.       . Flaxseed, Linseed, (FLAX SEED OIL PO) Take 1,300 mg by mouth daily.       Marland Kitchen glyBURIDE (DIABETA) 2.5 MG tablet Take 2.5 mg by mouth 2 (two) times daily with a meal.       . levETIRAcetam (KEPPRA) 750 MG tablet Take 1 tablet (750 mg total) by mouth every 12 (twelve) hours.  60  tablet  6  . metoprolol (LOPRESSOR) 50 MG tablet Take 25 mg by mouth 2 (two) times daily.       . Multiple Vitamin (MULITIVITAMIN WITH MINERALS) TABS Take 1 tablet by mouth daily.      . niacin (NIASPAN) 1000 MG CR tablet Take 1,000 mg by mouth at bedtime.       . ramipril (ALTACE) 2.5 MG tablet Take 2.5 mg by mouth daily.       . simvastatin (ZOCOR) 20 MG tablet Take 20 mg by mouth at bedtime.       . Tamsulosin HCl (FLOMAX) 0.4 MG CAPS Take 0.4 mg by mouth daily.        PAST MEDICAL HISTORY:   Past Medical History  Diagnosis Date  . Diabetes mellitus type 2   . GERD (gastroesophageal reflux disease)   . Hyperlipidemia     takes Niacin nightly  . DJD (degenerative joint disease)   . Urosepsis   . Hypertension     takes Metoprolol and Ramipril daily  . Dizziness   . Gout     hx of--6yrs ago  . Itching     on right leg  . Bruises easily     pt is on Plavix and ASA  . Shortness of breath  wakes pt up during night with occ shortness of breathe;states it happens about once a month  . Impaired hearing   . Urinary frequency   . UTI (lower urinary tract infection)     hx of--03/2010  . Anemia     takes Glyburide bid  . CAD (coronary artery disease) of artery bypass graft   . Peripheral vascular disease   . S/P carotid endarterectomy   . Pneumonia     "at least twice that I know of"  . Seizures 05/14/11    "first time was today"  . CVA (cerebral vascular accident) ~ 2009    right arm/leg occ hurts  . Carotid artery occlusion     PAST SURGICAL HISTORY:   Past Surgical History  Procedure Date  . Appendectomy 1968  . Colonoscopy   . Endarterectomy 04/09/2011    Procedure: ENDARTERECTOMY CAROTID;  Surgeon: Chuck Hint, MD;  Location: [facility] OR;  Service: Vascular;  Laterality: Left;  Left Carotid endarterectomy With Dacron Patch Angioplasty  . Cardiac catheterization 1998    dr hochrin  . Endarterectomy 05/08/2011    Procedure: ENDARTERECTOMY CAROTID;  Surgeon:  Chuck Hint, MD;  Location: [facility] OR;  Service: Vascular;  Laterality: Right;  with Heamashield Patch Angioplasty  . Cataract extraction w/ intraocular lens  implant, bilateral 2012  . Coronary artery bypass graft 1998    LIMA to LAD, SVG to diagonal, SVG to PDA, SVG to posterior lateral.   . Eye surgery 1953    right ; "piece of metal removed"  . Eye surgery 2012    Cataract- Bilateral  . Carotid endarterectomy     SOCIAL HISTORY:   History   Social History  . Marital Status: Married    Spouse Name: N/A    Number of Children: N/A  . Years of Education: N/A   Occupational History  . Not on file.   Social History Main Topics  . Smoking status: Former Smoker -- 1.0 packs/day for 20 years    Types: Cigarettes    Quit date: 03/02/1974  . Smokeless tobacco: Never Used  . Alcohol Use: No     quit in 1981  . Drug Use: No  . Sexually Active: No   Other Topics Concern  . Not on file   Social History Narrative  . No narrative on file    FAMILY HISTORY:   Family Status  Relation Status Death Age  . Mother Deceased   . Father Deceased     ROS:  Has numbness under chin since seizure/CEA.  A complete 10 system review of systems was obtained and was unremarkable apart from what is mentioned above.  PHYSICAL EXAMINATION:    VITALS:   Filed Vitals:   12/07/11 0813  BP: 142/60  Pulse: 70  Temp: 97.8 F (36.6 C)  Resp: 16  Height: 5\' 11"  (1.803 m)  Weight: 210 lb (95.255 kg)    GEN:  Normal appears male in no acute distress.  Appears stated age. HEENT:  Normocephalic, atraumatic. The mucous membranes are moist. The superficial temporal arteries are without ropiness or tenderness. Cardiovascular: Regular rate and rhythm. Lungs: Clear to auscultation bilaterally. Neck/Heme: There is a soft L carotid bruit.  There are no bruits on the R.  NEUROLOGICAL: Orientation:  The patient is alert and oriented x 3.  Fund of knowledge is appropriate.  Recent and remote  memory intact.  Attention span and concentration normal.  Repeats and names without difficulty. Cranial nerves: There is  good facial symmetry. The pupils are equal round and minimally reactive to light bilaterally. Fundoscopic exam reveals clear disc margins bilaterally. Extraocular muscles are intact and visual fields are full to confrontational testing. Speech is fluent and clear. Soft palate rises symmetrically and there is no tongue deviation. Hearing is intact to conversational tone. Tone: Tone is good throughout. Sensation: Sensation is intact to light touch and pinprick throughout (facial, trunk, extremities). Vibration is decreased at the bilateral big toe and ankle. There is no extinction with double simultaneous stimulation. There is no sensory dermatomal level identified. Coordination:  The patient has no difficulty with RAM's or FNF bilaterally. Motor: Strength is 5/5 in the bilateral upper and lower extremities.  Shoulder shrug is equal and symmetric. There is no pronator drift.  There are no fasciculations noted. DTR's: Deep tendon reflexes are 1/4 at the bilateral biceps, triceps, brachioradialis, patella and absent at the bilateral achilles.  Plantar responses are downgoing bilaterally. Gait and Station: The patient is able to ambulate without difficulty.    IMPRESSION:  1. Seizure, likely due to hyperperfusion injury after CEA.  The patient has been seizure free for 6 months.  PLAN:  1.   I talked to the patient and his wife regarding the increased risk of seizure as medications are weaned and they understand this risk.  They would like to proceed.   He will decrease Keppra to once daily for 2 weeks and then, if doing well, he may d/c the medication. 2.  He will not drive for 2 months (he is not driving now). 3.  He will f/u in 3 months and if doing well at that visit, I will d/c him back to his PCP. 4.  Greater than 50% of the 30 min visit was spent in counseling discussing  seizure/safety.

## 2011-12-07 NOTE — Patient Instructions (Addendum)
1.  Decrease Keppra to once daily for 2 weeks and then if you are doing well you may stop the medication 2.  Call me if he has any seizures and if they last over 5 minutes, go to the ER 3.  No driving for 2 more months.  Call me in 2 months and if doing well, you may resume driving 4.  I want to see you back in 3 months

## 2012-03-07 ENCOUNTER — Encounter: Payer: Self-pay | Admitting: Neurology

## 2012-03-07 ENCOUNTER — Ambulatory Visit (INDEPENDENT_AMBULATORY_CARE_PROVIDER_SITE_OTHER): Payer: Medicare Other | Admitting: Neurology

## 2012-03-07 VITALS — BP 122/74 | HR 80 | Temp 97.7°F | Resp 18 | Wt 213.0 lb

## 2012-03-07 DIAGNOSIS — R569 Unspecified convulsions: Secondary | ICD-10-CM

## 2012-03-07 NOTE — Progress Notes (Signed)
[name] was seen today in neurologic f/u.   The patient is accompanied by his wife, who supplements the history.  The patient is a 79 y.o. year old male with a history of seizure.  The patient has a history of bilateral carotid endarterectomies, the first being in February, 2013 and the second being on 05/08/2011.  The patient had 2 seizures on 05/14/2011.  It was felt that this was likely secondary to hyperperfusion injury.  The patient subsequently had an MRI of the brain, demonstrating T2 hyperintensities in the right occipital, temporal and parietal region.  His EEG was negative.  His EEG was repeated on 11/17/2011 and this was also normal.  He is now off of his Keppra.  The patient states that he actually feels better than he has in 2 years.  He has no complaints.  He does tell me that he fell on December 28.  He tripped and fell over the corner of a chair.  He bruised his ribs.  He did not hit his head.  There was no loss or alteration of consciousness.    ALLERGIES:   Allergies  Allergen Reactions  . Fexofenadine Hcl Other (See Comments)    Heart attack symptoms  . Flonase (Fluticasone Propionate) Other (See Comments)    Heart attack symptoms  . Nasal Spray Other (See Comments)    Heart attack symptoms     CURRENT MEDICATIONS:  Current Outpatient Prescriptions on File Prior to Visit  Medication Sig Dispense Refill  . aspirin EC 325 MG tablet Take 325 mg by mouth daily.       . calcium-vitamin D (OSCAL WITH D) 250-125 MG-UNIT per tablet Take 1 tablet by mouth daily.      . Cholecalciferol (VITAMIN D-3 PO) Take 600 mg by mouth daily.      . fish oil-omega-3 fatty acids 1000 MG capsule Take 1 g by mouth daily.       . Flaxseed, Linseed, (FLAX SEED OIL PO) Take 1,300 mg by mouth daily.       Marland Kitchen glyBURIDE (DIABETA) 2.5 MG tablet Take 2.5 mg by mouth 2 (two) times daily with a meal.       . metoprolol (LOPRESSOR) 50 MG tablet Take 25 mg by mouth 2 (two) times daily.       .  Multiple Vitamin (MULITIVITAMIN WITH MINERALS) TABS Take 1 tablet by mouth daily.      . niacin (NIASPAN) 1000 MG CR tablet Take 1,000 mg by mouth at bedtime.       . ramipril (ALTACE) 2.5 MG tablet Take 2.5 mg by mouth daily.       . simvastatin (ZOCOR) 20 MG tablet Take 20 mg by mouth at bedtime.       . Tamsulosin HCl (FLOMAX) 0.4 MG CAPS Take 0.4 mg by mouth daily.        PAST MEDICAL HISTORY:   Past Medical History  Diagnosis Date  . Diabetes mellitus type 2   . GERD (gastroesophageal reflux disease)   . Hyperlipidemia     takes Niacin nightly  . DJD (degenerative joint disease)   . Urosepsis   . Hypertension     takes Metoprolol and Ramipril daily  . Dizziness   . Gout     hx of--44yrs ago  . Itching     on right leg  . Bruises easily     pt is on Plavix and ASA  . Shortness of breath     wakes  pt up during night with occ shortness of breathe;states it happens about once a month  . Impaired hearing   . Urinary frequency   . UTI (lower urinary tract infection)     hx of--03/2010  . Anemia     takes Glyburide bid  . CAD (coronary artery disease) of artery bypass graft   . Peripheral vascular disease   . S/P carotid endarterectomy   . Pneumonia     "at least twice that I know of"  . Seizures 05/14/11    "first time was today"  . CVA (cerebral vascular accident) ~ 2009    right arm/leg occ hurts  . Carotid artery occlusion     PAST SURGICAL HISTORY:   Past Surgical History  Procedure Date  . Appendectomy 1968  . Colonoscopy   . Endarterectomy 04/09/2011    Procedure: ENDARTERECTOMY CAROTID;  Surgeon: Chuck Hint, MD;  Location: [facility] OR;  Service: Vascular;  Laterality: Left;  Left Carotid endarterectomy With Dacron Patch Angioplasty  . Cardiac catheterization 1998    dr hochrin  . Endarterectomy 05/08/2011    Procedure: ENDARTERECTOMY CAROTID;  Surgeon: Chuck Hint, MD;  Location: [facility] OR;  Service: Vascular;  Laterality: Right;  with Heamashield  Patch Angioplasty  . Cataract extraction w/ intraocular lens  implant, bilateral 2012  . Coronary artery bypass graft 1998    LIMA to LAD, SVG to diagonal, SVG to PDA, SVG to posterior lateral.   . Eye surgery 1953    right ; "piece of metal removed"  . Eye surgery 2012    Cataract- Bilateral  . Carotid endarterectomy     SOCIAL HISTORY:   History   Social History  . Marital Status: Married    Spouse Name: N/A    Number of Children: N/A  . Years of Education: N/A   Occupational History  . retired     [employer]   Social History Main Topics  . Smoking status: Former Smoker -- 1.0 packs/day for 20 years    Types: Cigarettes    Quit date: 03/02/1974  . Smokeless tobacco: Never Used  . Alcohol Use: No     Comment: quit in 1981  . Drug Use: No  . Sexually Active: No   Other Topics Concern  . Not on file   Social History Narrative  . No narrative on file    FAMILY HISTORY:   Family Status  Relation Status Death Age  . Mother Deceased     "old age"  . Father Deceased     "old age"  . Brother Deceased     2, MVA and "bone CA"  . Brother Alive     1, alive and well  . Sister Alive     5, alive and well  . Child Deceased     29 days old  . Child Alive     2, healthy    ROS:  Has numbness under chin since seizure/CEA.  A complete 10 system review of systems was obtained and was unremarkable apart from what is mentioned above.  PHYSICAL EXAMINATION:    VITALS:   Filed Vitals:   03/07/12 0929  BP: 122/74  Pulse: 80  Temp: 97.7 F (36.5 C)  Resp: 18  Weight: 213 lb (96.616 kg)    GEN:  Normal appears male in no acute distress.  Appears stated age. HEENT:  Normocephalic, atraumatic. The mucous membranes are moist. The superficial temporal arteries are without ropiness or tenderness. Cardiovascular:  Regular rate and rhythm. Lungs: Clear to auscultation bilaterally. Neck/Heme: There is a soft L carotid bruit.  There are no bruits on the  R.  NEUROLOGICAL: Orientation:  The patient is alert and oriented x 3.  Fund of knowledge is appropriate.  Recent and remote memory intact.  Attention span and concentration normal.  Repeats and names without difficulty. Cranial nerves: There is good facial symmetry. The pupils are equal round and minimally reactive to light bilaterally. Fundoscopic exam reveals clear disc margins bilaterally. Extraocular muscles are intact and visual fields are full to confrontational testing. Speech is fluent and clear. Soft palate rises symmetrically and there is no tongue deviation. Hearing is intact to conversational tone. Tone: Tone is good throughout. Sensation: Sensation is intact to light touch throughout. Coordination:  The patient has no difficulty with RAM's or FNF bilaterally. Motor: Strength is 5/5 in the bilateral upper and lower extremities.  Shoulder shrug is equal and symmetric. There is no pronator drift.  There are no fasciculations noted. DTR's: Deep tendon reflexes are 1/4 at the bilateral biceps, triceps, brachioradialis, patella and absent at the bilateral achilles.  Plantar responses are downgoing bilaterally. Gait and Station: The patient is able to ambulate without difficulty.    IMPRESSION:  1. Seizure, likely due to hyperperfusion injury after CEA.  The patient has been seizure free since March, 2013.  PLAN:  1.   I think that the patient will continue to do well and I recommend that he continue to remain off of his Keppra. 2.  from a neurologic standpoint, I think that it is safe for the patient to resume driving.  He should report to [facility] as required by law.  I did give him a letter stating that from a pure neurologic standpoint, I have no objection to him resuming driving. 3.  He can follow up prn.

## 2012-03-21 ENCOUNTER — Encounter: Payer: Self-pay | Admitting: Cardiology

## 2012-03-21 ENCOUNTER — Ambulatory Visit (INDEPENDENT_AMBULATORY_CARE_PROVIDER_SITE_OTHER): Payer: Medicare Other | Admitting: Cardiology

## 2012-03-21 VITALS — BP 132/72 | HR 84 | Ht 71.0 in | Wt 210.0 lb

## 2012-03-21 DIAGNOSIS — I25810 Atherosclerosis of coronary artery bypass graft(s) without angina pectoris: Secondary | ICD-10-CM

## 2012-03-21 MED ORDER — METOPROLOL TARTRATE 50 MG PO TABS: 25.0000 mg | ORAL_TABLET | Freq: Two times a day (BID) | ORAL | Status: DC

## 2012-03-21 MED ORDER — ASPIRIN 81 MG PO TBEC: 81.0000 mg | DELAYED_RELEASE_TABLET | Freq: Two times a day (BID) | ORAL | Status: DC

## 2012-03-21 MED ORDER — RAMIPRIL 2.5 MG PO TABS: 2.5000 mg | ORAL_TABLET | Freq: Every day | ORAL | Status: DC

## 2012-03-21 MED ORDER — SIMVASTATIN 20 MG PO TABS: 20.0000 mg | ORAL_TABLET | Freq: Every day | ORAL | Status: DC

## 2012-03-21 MED ORDER — NIACIN ER (ANTIHYPERLIPIDEMIC) 1000 MG PO TBCR: 1000.0000 mg | EXTENDED_RELEASE_TABLET | Freq: Every day | ORAL | Status: DC

## 2012-03-21 MED ORDER — CLOPIDOGREL BISULFATE 75 MG PO TABS: 75.0000 mg | ORAL_TABLET | Freq: Every day | ORAL | Status: DC

## 2012-03-21 NOTE — Patient Instructions (Addendum)
Please decrease your Aspirin to 81 mg a day Continue all other medications as listed.   Follow up in 1 year with Dr Antoine Poche.  You will receive a letter in the mail 2 months before you are due.  Please call us when you receive this letter to schedule your follow up appointment.

## 2012-03-21 NOTE — Progress Notes (Signed)
HPI The patient presents for followup of his coronary disease. Since I last saw him he has had bilateral carotid endarterectomies. He did have a seizure complicating this thought possibly to be embolic. However, he thinks has recovered completely from this. He thinks he is doing well from a cardiovascular standpoint. He walks daily.  The patient denies any new symptoms such as chest discomfort, neck or arm discomfort. There has been no new shortness of breath, PND or orthopnea. There have been no reported palpitations, presyncope or syncope.  Allergies  Allergen Reactions  . Fexofenadine Hcl Other (See Comments)    Heart attack symptoms  . Flonase (Fluticasone Propionate) Other (See Comments)    Heart attack symptoms  . Nasal Spray Other (See Comments)    Heart attack symptoms     Current Outpatient Prescriptions  Medication Sig Dispense Refill  . aspirin EC 325 MG tablet Take 325 mg by mouth 2 (two) times daily.       . calcium-vitamin D (OSCAL WITH D) 250-125 MG-UNIT per tablet Take 1 tablet by mouth 2 (two) times daily.       . Cholecalciferol (VITAMIN D-3 PO) Take 600 mg by mouth daily.      . clopidogrel (PLAVIX) 75 MG tablet Take 75 mg by mouth daily.      . fish oil-omega-3 fatty acids 1000 MG capsule Take 1 g by mouth daily.       . Flaxseed, Linseed, (FLAX SEED OIL PO) Take 1,300 mg by mouth daily.       Marland Kitchen glyBURIDE (DIABETA) 2.5 MG tablet Take 2.5 mg by mouth 2 (two) times daily with a meal.       . meloxicam (MOBIC) 15 MG tablet Take 15 mg by mouth daily.      . metoprolol (LOPRESSOR) 50 MG tablet Take 25 mg by mouth 2 (two) times daily.       . Multiple Vitamin (MULITIVITAMIN WITH MINERALS) TABS Take 1 tablet by mouth daily.      . niacin (NIASPAN) 1000 MG CR tablet Take 1,000 mg by mouth at bedtime.       . ramipril (ALTACE) 2.5 MG tablet Take 2.5 mg by mouth daily.       . simvastatin (ZOCOR) 20 MG tablet Take 20 mg by mouth at bedtime.       . Tamsulosin HCl (FLOMAX)  0.4 MG CAPS Take 0.4 mg by mouth daily.        Past Medical History  Diagnosis Date  . Diabetes mellitus type 2   . GERD (gastroesophageal reflux disease)   . Hyperlipidemia     takes Niacin nightly  . DJD (degenerative joint disease)   . Urosepsis   . Hypertension     takes Metoprolol and Ramipril daily  . Dizziness   . Gout     hx of--25yrs ago  . Itching     on right leg  . Bruises easily     pt is on Plavix and ASA  . Shortness of breath     wakes pt up during night with occ shortness of breathe;states it happens about once a month  . Impaired hearing   . Urinary frequency   . UTI (lower urinary tract infection)     hx of--03/2010  . Anemia     takes Glyburide bid  . CAD (coronary artery disease) of artery bypass graft   . Peripheral vascular disease   . S/P carotid endarterectomy   . Pneumonia     "  at least twice that I know of"  . Seizures 05/14/11    "first time was today"  . CVA (cerebral vascular accident) ~ 2009    right arm/leg occ hurts  . Carotid artery occlusion     Past Surgical History  Procedure Date  . Appendectomy 1968  . Colonoscopy   . Endarterectomy 04/09/2011    Procedure: ENDARTERECTOMY CAROTID;  Surgeon: Chuck Hint, MD;  Location: [facility] OR;  Service: Vascular;  Laterality: Left;  Left Carotid endarterectomy With [place] Patch Angioplasty  . Cardiac catheterization 1998    dr hochrin  . Endarterectomy 05/08/2011    Procedure: ENDARTERECTOMY CAROTID;  Surgeon: Chuck Hint, MD;  Location: [facility] OR;  Service: Vascular;  Laterality: Right;  with [place] Patch Angioplasty  . Cataract extraction w/ intraocular lens  implant, bilateral 2012  . Coronary artery bypass graft 1998    LIMA to LAD, SVG to diagonal, SVG to PDA, SVG to posterior lateral.   . Eye surgery 1953    right ; "piece of metal removed"  . Eye surgery 2012    Cataract- Bilateral  . Carotid endarterectomy     ROS:  As stated in the HPI and negative for all other  systems.   PHYSICAL EXAM BP 132/72  Ht 5\' 11"  (1.803 m)  Wt 210 lb (95.255 kg)  BMI 29.29 kg/m2 GENERAL:  Well appearing HEENT:  Pupils equal round and reactive, fundi not visualized, oral mucosa unremarkable, well healed endarterectomy scars NECK:  No jugular venous distention, waveform within normal limits, carotid upstroke brisk and symmetric, bilatral bruits, no thyromegaly LYMPHATICS:  No cervical, inguinal adenopathy LUNGS:  Clear to auscultation bilaterally BACK:  No CVA tenderness CHEST:  Well healed sternotomy scar. HEART:  PMI not displaced or sustained,S1 and S2 within normal limits, no S3, no S4, no clicks, no rubs, no murmurs ABD:  Flat, positive bowel sounds normal in frequency in pitch, no bruits, no rebound, no guarding, no midline pulsatile mass, no hepatomegaly, no splenomegaly EXT:  2 plus pulses upper, absent DP/PT right and decreased on the left, no edema, no cyanosis no clubbing   ASSESSMENT AND PLAN  CAD (coronary artery disease) of artery bypass graft -  The patient is doing well. He had a stress test last year. No further cardiovascular testing is suggested. He will continue with risk reduction. He can reduce his aspirin back to 81 mg daily.   Hypertension -  His blood pressure is well controlled. He will continue the meds as listed.  Hyperlipidemia -  I will defer to his primary provider with a goal LDL less than 70 and HDL greater than 50.

## 2012-05-25 ENCOUNTER — Ambulatory Visit: Payer: Medicare Other | Admitting: Neurosurgery

## 2012-05-25 ENCOUNTER — Other Ambulatory Visit: Payer: Medicare Other

## 2012-05-27 ENCOUNTER — Other Ambulatory Visit (INDEPENDENT_AMBULATORY_CARE_PROVIDER_SITE_OTHER): Payer: Medicare Other | Admitting: Vascular Surgery

## 2012-05-27 ENCOUNTER — Other Ambulatory Visit: Payer: Self-pay

## 2012-05-27 DIAGNOSIS — Z48812 Encounter for surgical aftercare following surgery on the circulatory system: Secondary | ICD-10-CM

## 2012-05-27 DIAGNOSIS — I6529 Occlusion and stenosis of unspecified carotid artery: Secondary | ICD-10-CM

## 2012-05-30 ENCOUNTER — Encounter: Payer: Self-pay | Admitting: Vascular Surgery

## 2012-05-30 ENCOUNTER — Other Ambulatory Visit: Payer: Self-pay

## 2012-08-09 ENCOUNTER — Ambulatory Visit (INDEPENDENT_AMBULATORY_CARE_PROVIDER_SITE_OTHER): Payer: Medicare Other | Admitting: Family Medicine

## 2012-08-09 ENCOUNTER — Encounter: Payer: Self-pay | Admitting: Family Medicine

## 2012-08-09 VITALS — BP 148/73 | HR 64 | Temp 98.2°F | Wt 209.2 lb

## 2012-08-09 DIAGNOSIS — E119 Type 2 diabetes mellitus without complications: Secondary | ICD-10-CM

## 2012-08-09 DIAGNOSIS — E785 Hyperlipidemia, unspecified: Secondary | ICD-10-CM

## 2012-08-09 DIAGNOSIS — I10 Essential (primary) hypertension: Secondary | ICD-10-CM

## 2012-08-09 DIAGNOSIS — R569 Unspecified convulsions: Secondary | ICD-10-CM

## 2012-08-09 DIAGNOSIS — I25810 Atherosclerosis of coronary artery bypass graft(s) without angina pectoris: Secondary | ICD-10-CM

## 2012-08-09 DIAGNOSIS — I6529 Occlusion and stenosis of unspecified carotid artery: Secondary | ICD-10-CM

## 2012-08-09 DIAGNOSIS — I739 Peripheral vascular disease, unspecified: Secondary | ICD-10-CM

## 2012-08-09 LAB — POCT UA - MICROALBUMIN: Microalbumin Ur, POC: NEGATIVE mg/L

## 2012-08-09 LAB — POCT GLYCOSYLATED HEMOGLOBIN (HGB A1C): Hemoglobin A1C: 6.1

## 2012-08-09 NOTE — Patient Instructions (Signed)
Diabetes and Foot Care Diabetes may cause you to have a poor blood supply (circulation) to your legs and feet. Because of this, the skin may be thinner, break easier, and heal more slowly. You also may have nerve damage in your legs and feet causing decreased feeling. You may not notice minor injuries to your feet that could lead to serious problems or infections. Taking care of your feet is one of the most important things you can do for yourself.  HOME CARE INSTRUCTIONS  Do not go barefoot. Bare feet are easily injured.  Check your feet daily for blisters, cuts, and redness.  Wash your feet with warm water (not hot) and mild soap. Pat your feet and between your toes until completely dry.  Apply a moisturizing lotion that does not contain alcohol or petroleum jelly to the dry skin on your feet and to dry brittle toenails. Do not put it between your toes.  Trim your toenails straight across. Do not dig under them or around the cuticle.  Do not cut corns or calluses, or try to remove them with medicine.  Wear clean cotton socks or stockings every day. Make sure they are not too tight. Do not wear knee high stockings since they may decrease blood flow to your legs.  Wear leather shoes that fit properly and have enough cushioning. To break in new shoes, wear them just a few hours a day to avoid injuring your feet.  Wear shoes at all times, even in the house.  Do not cross your legs. This may decrease the blood flow to your feet.  If you find a minor scrape, cut, or break in the skin on your feet, keep it and the skin around it clean and dry. These areas may be cleansed with mild soap and water. Do not use peroxide, alcohol, iodine or Merthiolate.  When you remove an adhesive bandage, be sure not to harm the skin around it.  If you have a wound, look at it several times a day to make sure it is healing.  Do not use heating pads or hot water bottles. Burns can occur. If you have lost feeling  in your feet or legs, you may not know it is happening until it is too late.  Report any cuts, sores or bruises to your caregiver. Do not wait! SEEK MEDICAL CARE IF:   You have an injury that is not healing or you notice redness, numbness, burning, or tingling.  Your feet always feel cold.  You have pain or cramps in your legs and feet. SEEK IMMEDIATE MEDICAL CARE IF:   There is increasing redness, swelling, or increasing pain in the wound.  There is a red line that goes up your leg.  Pus is coming from a wound.  You develop an unexplained oral temperature above 102 F (38.9 C), or as your caregiver suggests.  You notice a bad smell coming from an ulcer or wound. MAKE SURE YOU:   Understand these instructions.  Will watch your condition.  Will get help right away if you are not doing well or get worse. Document Released: 02/14/2000 Document Revised: 05/11/2011 Document Reviewed: 08/22/2008 [facility] Patient Information 2014 [place], [place].      Dr Woodroe Mode Recommendations  Diet and Exercise discussed with patient.  For nutrition information, I recommend books:  1).Eat to Live by Dr Monico Hoar. 2).Prevent and Reverse Heart Disease by Dr Suzzette Righter. 3) Dr Katherina Right Book:  Program to Reverse Diabetes  Exercise recommendations are:  If unable to walk, then the patient can exercise in a chair 3 times a day. By flapping arms like a bird gently and raising legs outwards to the front.  If ambulatory, the patient can go for walks for 30 minutes 3 times a week. Then increase the intensity and duration as tolerated.  Goal is to try to attain exercise frequency to 5 times a week.  If applicable: Best to perform resistance exercises (machines or weights) 2 days a week and cardio type exercises 3 days per week.   Diabetes and Exercise Regular exercise is important and can help:   Control blood glucose (sugar).  Decrease blood pressure.    Control  blood lipids (cholesterol, triglycerides).  Improve overall health. BENEFITS FROM EXERCISE  Improved fitness.  Improved flexibility.  Improved endurance.  Increased bone density.  Weight control.  Increased muscle strength.  Decreased body fat.  Improvement of the body's use of insulin, a hormone.  Increased insulin sensitivity.  Reduction of insulin needs.  Reduced stress and tension.  Helps you feel better. People with diabetes who add exercise to their lifestyle gain additional benefits, including:  Weight loss.  Reduced appetite.  Improvement of the body's use of blood glucose.  Decreased risk factors for heart disease:  Lowering of cholesterol and triglycerides.  Raising the level of good cholesterol (high-density lipoproteins, HDL).  Lowering blood sugar.  Decreased blood pressure. TYPE 1 DIABETES AND EXERCISE  Exercise will usually lower your blood glucose.  If blood glucose is greater than 240 mg/dl, check urine ketones. If ketones are present, do not exercise.  Location of the insulin injection sites may need to be adjusted with exercise. Avoid injecting insulin into areas of the body that will be exercised. For example, avoid injecting insulin into:  The arms when playing tennis.  The legs when jogging. For more information, discuss this with your caregiver.  Keep a record of:  Food intake.  Type and amount of exercise.  Expected peak times of insulin action.  Blood glucose levels. Do this before, during, and after exercise. Review your records with your caregiver. This will help you to develop guidelines for adjusting food intake and insulin amounts.  TYPE 2 DIABETES AND EXERCISE  Regular physical activity can help control blood glucose.  Exercise is important because it may:  Increase the body's sensitivity to insulin.  Improve blood glucose control.  Exercise reduces the risk of heart disease. It decreases serum cholesterol and  triglycerides. It also lowers blood pressure.  Those who take insulin or oral hypoglycemic agents should watch for signs of hypoglycemia. These signs include dizziness, shaking, sweating, chills, and confusion.  Body water is lost during exercise. It must be replaced. This will help to avoid loss of body fluids (dehydration) or heat stroke. Be sure to talk to your caregiver before starting an exercise program to make sure it is safe for you. Remember, any activity is better than none.  Document Released: 05/09/2003 Document Revised: 05/11/2011 Document Reviewed: 08/23/2008 [facility] Patient Information 2014 [place], [place].  Type 2 Diabetes Mellitus, Adult Type 2 diabetes mellitus, often simply referred to as type 2 diabetes, is a long-lasting (chronic) disease. In type 2 diabetes, the pancreas does not make enough insulin (a hormone), the cells are less responsive to the insulin that is made (insulin resistance), or both. Normally, insulin moves sugars from food into the tissue cells. The tissue cells use the sugars for energy. The lack of insulin or the  lack of normal response to insulin causes excess sugars to build up in the blood instead of going into the tissue cells. As a result, high blood sugar (hyperglycemia) develops. The effect of high sugar (glucose) levels can cause many complications. Type 2 diabetes was also previously called adult-onset diabetes but it can occur at any age.  RISK FACTORS  A person is predisposed to developing type 2 diabetes if someone in the family has the disease and also has one or more of the following primary risk factors:  Overweight.  An inactive lifestyle.  A history of consistently eating high-calorie foods. Maintaining a normal weight and regular physical activity can reduce the chance of developing type 2 diabetes. SYMPTOMS  A person with type 2 diabetes may not show symptoms initially. The symptoms of type 2 diabetes appear slowly. The symptoms  include:  Increased thirst (polydipsia).  Increased urination (polyuria).  Increased urination during the night (nocturia).  Weight loss. This weight loss may be rapid.  Frequent, recurring infections.  Tiredness (fatigue).  Weakness.  Vision changes, such as blurred vision.  Fruity smell to your breath.  Abdominal pain.  Nausea or vomiting.  Cuts or bruises which are slow to heal.  Tingling or numbness in the hands or feet. DIAGNOSIS Type 2 diabetes is frequently not diagnosed until complications of diabetes are present. Type 2 diabetes is diagnosed when symptoms or complications are present and when blood glucose levels are increased. Your blood glucose level may be checked by one or more of the following blood tests:  A fasting blood glucose test. You will not be allowed to eat for at least 8 hours before a blood sample is taken.  A random blood glucose test. Your blood glucose is checked at any time of the day regardless of when you ate.  A hemoglobin A1c blood glucose test. A hemoglobin A1c test provides information about blood glucose control over the previous 3 months.  An oral glucose tolerance test (OGTT). Your blood glucose is measured after you have not eaten (fasted) for 2 hours and then after you drink a glucose-containing beverage. TREATMENT   You may need to take insulin or diabetes medicine daily to keep blood glucose levels in the desired range.  You will need to match insulin dosing with exercise and healthy food choices. The treatment goal is to maintain the before meal blood sugar (preprandial glucose) level at 70 130 mg/dL. HOME CARE INSTRUCTIONS   Have your hemoglobin A1c level checked twice a year.  Perform daily blood glucose monitoring as directed by your caregiver.  Monitor urine ketones when you are ill and as directed by your caregiver.  Take your diabetes medicine or insulin as directed by your caregiver to maintain your blood glucose  levels in the desired range.  Never run out of diabetes medicine or insulin. It is needed every day.  Adjust insulin based on your intake of carbohydrates. Carbohydrates can raise blood glucose levels but need to be included in your diet. Carbohydrates provide vitamins, minerals, and fiber which are an essential part of a healthy diet. Carbohydrates are found in fruits, vegetables, whole grains, dairy products, legumes, and foods containing added sugars.    Eat healthy foods. Alternate 3 meals with 3 snacks.  Lose weight if overweight.  Carry a medical alert card or wear your medical alert jewelry.  Carry a 15 gram carbohydrate snack with you at all times to treat low blood glucose (hypoglycemia). Some examples of 15 gram carbohydrate snacks  include:  Glucose tablets, 3 or 4   Glucose gel, 15 gram tube  Raisins, 2 tablespoons (24 grams)  Jelly beans, 6  Animal crackers, 8  Regular pop, 4 ounces (120 mL)  Gummy treats, 9  Recognize hypoglycemia. Hypoglycemia occurs with blood glucose levels of 70 mg/dL and below. The risk for hypoglycemia increases when fasting or skipping meals, during or after intense exercise, and during sleep. Hypoglycemia symptoms can include:  Tremors or shakes.  Decreased ability to concentrate.  Sweating.  Increased heart rate.  Headache.  Dry mouth.  Hunger.  Irritability.  Anxiety.  Restless sleep.  Altered speech or coordination.  Confusion.  Treat hypoglycemia promptly. If you are alert and able to safely swallow, follow the 15:15 rule:  Take 15 20 grams of rapid-acting glucose or carbohydrate. Rapid-acting options include glucose gel, glucose tablets, or 4 ounces (120 mL) of fruit juice, regular soda, or low fat milk.  Check your blood glucose level 15 minutes after taking the glucose.  Take 15 20 grams more of glucose if the repeat blood glucose level is still 70 mg/dL or below.  Eat a meal or snack within 1 hour once  blood glucose levels return to normal.    Be alert to polyuria and polydipsia which are early signs of hyperglycemia. An early awareness of hyperglycemia allows for prompt treatment. Treat hyperglycemia as directed by your caregiver.  Engage in at least 150 minutes of moderate-intensity physical activity a week, spread over at least 3 days of the week or as directed by your caregiver. In addition, you should engage in resistance exercise at least 2 times a week or as directed by your caregiver.  Adjust your medicine and food intake as needed if you start a new exercise or sport.  Follow your sick day plan at any time you are unable to eat or drink as usual.  Avoid tobacco use.  Limit alcohol intake to no more than 1 drink per day for nonpregnant women and 2 drinks per day for men. You should drink alcohol only when you are also eating food. Talk with your caregiver whether alcohol is safe for you. Tell your caregiver if you drink alcohol several times a week.  Follow up with your caregiver regularly.  Schedule an eye exam soon after the diagnosis of type 2 diabetes and then annually.  Perform daily skin and foot care. Examine your skin and feet daily for cuts, bruises, redness, nail problems, bleeding, blisters, or sores. A foot exam by a caregiver should be done annually.  Brush your teeth and gums at least twice a day and floss at least once a day. Follow up with your dentist regularly.  Share your diabetes management plan with your workplace or school.  Stay up-to-date with immunizations.  Learn to manage stress.  Obtain ongoing diabetes education and support as needed.  Participate in, or seek rehabilitation as needed to maintain or improve independence and quality of life. Request a physical or occupational therapy referral if you are having foot or hand numbness or difficulties with grooming, dressing, eating, or physical activity. SEEK MEDICAL CARE IF:   You are unable to  eat food or drink fluids for more than 6 hours.  You have nausea and vomiting for more than 6 hours.  Your blood glucose level is over 240 mg/dL.  There is a change in mental status.  You develop an additional serious illness.  You have diarrhea for more than 6 hours.  You have been  sick or have had a fever for a couple of days and are not getting better.  You have pain during any physical activity.  SEEK IMMEDIATE MEDICAL CARE IF:  You have difficulty breathing.  You have moderate to large ketone levels. MAKE SURE YOU:  Understand these instructions.  Will watch your condition.  Will get help right away if you are not doing well or get worse. Document Released: 02/16/2005 Document Revised: 11/11/2011 Document Reviewed: 09/15/2011 [facility] Patient Information 2014 [place], [place].

## 2012-08-09 NOTE — Progress Notes (Signed)
Patient ID: [name], male   DOB: 1931/11/20, 79 y.o.   MRN: [id number] SUBJECTIVE: CC: Chief Complaint  Patient presents with  . Follow-up    4 month follow up       HPI: Doing great, no complaints. Rx up to date. Came for  Follow up and lab work.  Patient is here for follow up of Diabetes Mellitus/ Hypertension/CAD/hyperlipidemia/CVA/: Symptoms of DM: Denies Nocturia ,Denies Urinary Frequency , denies Blurred vision ,deniesDizziness,denies.Dysuria,denies paresthesias, denies extremity pain or ulcers.Marland Kitchendenies chest pain. has had an annual eye exam. do check the feet. Does check CBGs. Average CBG:87 prior to Breakfast Denies episodes of hypoglycemia. Does have an emergency hypoglycemic plan. admits toCompliance with medications. Denies Problems with medications.   PMH/PSH: reviewed/updated in Epic  SH/FH: reviewed/updated in Epic  Allergies: reviewed/updated in Epic  Medications: reviewed/updated in Epic  Immunizations: reviewed/updated in Epic  ROS: As above in the HPI. All other systems are stable or negative.  OBJECTIVE: APPEARANCE:  Patient in no acute distress.The patient appeared well nourished and normally developed. Acyanotic. Waist:42 1/4 inches VITAL SIGNS:BP 148/73  Pulse 64  Temp(Src) 98.2 F (36.8 C) (Oral)  Wt 209 lb 3.2 oz (94.892 kg)  BMI 29.19 kg/m2 BP 130/75 WM  SKIN: warm and  Dry without overt rashes, tattoos and scars  HEAD and Neck: without JVD, Head and scalp: normal Eyes:No scleral icterus. Fundi normal, eye movements normal. Ears: Auricle normal, canal normal, Tympanic membranes normal, insufflation normal. Nose: normal Throat: normal Neck & thyroid: normal  CHEST & LUNGS: Chest wall: normal Lungs: Clear  CVS: Reveals the PMI to be normally located. Regular rhythm, First and Second Heart sounds are normal,  absence of murmurs, rubs or gallops. Peripheral vasculature: Radial pulses: normal Dorsal pedis pulses:  normal Posterior pulses: normal  ABDOMEN:  Appearance:obese Benign, no organomegaly, no masses, no Abdominal Aortic enlargement. No Guarding , no rebound. No Bruits. Bowel sounds: normal  RECTAL: N/A GU: N/A  EXTREMETIES: nonedematous. Both Femoral and Pedal pulses are normal.  MUSCULOSKELETAL:  Spine: normal Joints: intact  NEUROLOGIC: oriented to time,place and person; nonfocal. Strength is normal Sensory is normal Reflexes are normal Cranial Nerves are normal.  ASSESSMENT: Diabetes mellitus - Plan: COMPLETE METABOLIC PANEL WITH GFR, POCT glycosylated hemoglobin (Hb A1C), POCT UA - Microalbumin  Peripheral vascular disease - Plan: NMR Lipoprofile with Lipids  Occlusion and stenosis of carotid artery without mention of cerebral infarction, unspecified laterality - Plan: NMR Lipoprofile with Lipids  CAD (coronary artery disease) of artery bypass graft - Plan: COMPLETE METABOLIC PANEL WITH GFR, NMR Lipoprofile with Lipids  Hypertension - Plan: COMPLETE METABOLIC PANEL WITH GFR  Hyperlipidemia - Plan: COMPLETE METABOLIC PANEL WITH GFR, NMR Lipoprofile with Lipids  Seizure    PLAN: Orders Placed This Encounter  Procedures  . COMPLETE METABOLIC PANEL WITH GFR  . NMR Lipoprofile with Lipids  . POCT glycosylated hemoglobin (Hb A1C)  . POCT UA - Microalbumin   No orders of the defined types were placed in this encounter.   Current outpatient prescriptions:aspirin EC 81 MG EC tablet, Take 1 tablet (81 mg total) by mouth 2 (two) times daily., Disp: , Rfl: ;  calcium-vitamin D (OSCAL WITH D) 250-125 MG-UNIT per tablet, Take 1 tablet by mouth 2 (two) times daily. , Disp: , Rfl: ;  Cholecalciferol (VITAMIN D-3 PO), Take 600 mg by mouth daily., Disp: , Rfl: ;  clopidogrel (PLAVIX) 75 MG tablet, Take 1 tablet (75 mg total) by mouth daily., Disp: 90 tablet,  Rfl: 3 fish oil-omega-3 fatty acids 1000 MG capsule, Take 1 g by mouth daily. , Disp: , Rfl: ;  Flaxseed, Linseed, (FLAX  SEED OIL PO), Take 1,300 mg by mouth daily. , Disp: , Rfl: ;  glyBURIDE (DIABETA) 2.5 MG tablet, Take 2.5 mg by mouth 2 (two) times daily with a meal. , Disp: , Rfl: ;  meloxicam (MOBIC) 15 MG tablet, Take 15 mg by mouth daily., Disp: , Rfl:  metoprolol (LOPRESSOR) 50 MG tablet, Take 0.5 tablets (25 mg total) by mouth 2 (two) times daily., Disp: 90 tablet, Rfl: 3;  Multiple Vitamin (MULITIVITAMIN WITH MINERALS) TABS, Take 1 tablet by mouth daily., Disp: , Rfl: ;  niacin (NIASPAN) 1000 MG CR tablet, Take 1 tablet (1,000 mg total) by mouth at bedtime., Disp: 90 tablet, Rfl: 3;  ramipril (ALTACE) 2.5 MG tablet, Take 1 tablet (2.5 mg total) by mouth daily., Disp: 90 tablet, Rfl: 3 simvastatin (ZOCOR) 20 MG tablet, Take 1 tablet (20 mg total) by mouth at bedtime., Disp: 90 tablet, Rfl: 3;  Tamsulosin HCl (FLOMAX) 0.4 MG CAPS, Take 0.4 mg by mouth daily., Disp: , Rfl:  meds reviewed. No changes.  Return in about 4 months (around 12/09/2012) for Recheck medical problems.   Xaidyn Kepner P. Modesto Charon, M.D.

## 2012-08-10 LAB — COMPLETE METABOLIC PANEL WITH GFR
ALT: 16 U/L (ref 0–53)
AST: 18 U/L (ref 0–37)
Albumin: 4.1 g/dL (ref 3.5–5.2)
Alkaline Phosphatase: 38 U/L — ABNORMAL LOW (ref 39–117)
BUN: 15 mg/dL (ref 6–23)
CO2: 28 mEq/L (ref 19–32)
Calcium: 9.1 mg/dL (ref 8.4–10.5)
Chloride: 98 mEq/L (ref 96–112)
Creat: 1.16 mg/dL (ref 0.50–1.35)
GFR, Est African American: 69 mL/min
GFR, Est Non African American: 60 mL/min
Glucose, Bld: 94 mg/dL (ref 70–99)
Potassium: 4.7 mEq/L (ref 3.5–5.3)
Sodium: 134 mEq/L — ABNORMAL LOW (ref 135–145)
Total Bilirubin: 0.4 mg/dL (ref 0.3–1.2)
Total Protein: 7.2 g/dL (ref 6.0–8.3)

## 2012-08-10 LAB — NMR LIPOPROFILE WITH LIPIDS

## 2012-08-10 NOTE — Progress Notes (Signed)
Quick Note:  Lab result at goal. For some Reason the lipids were not done. No change in Medications for now. No Change in plans and follow up. ______

## 2012-08-11 NOTE — Addendum Note (Signed)
Addended by: Lisbeth Ply C on: 08/11/2012 04:01 PM   Modules accepted: Orders

## 2012-08-11 NOTE — Addendum Note (Signed)
Addended by: Roselyn Reef on: 08/11/2012 03:55 PM   Modules accepted: Orders

## 2012-08-12 NOTE — Addendum Note (Signed)
Addended by: Roselyn Reef on: 08/12/2012 08:17 AM   Modules accepted: Orders

## 2012-08-16 LAB — NMR LIPOPROFILE WITH LIPIDS
Cholesterol, Total: 121 mg/dL (ref <200)
HDL Particle Number: 25.2 umol/L — ABNORMAL LOW (ref >=30.5)
HDL Size: 8.7 nm — ABNORMAL LOW (ref >=9.2)
HDL-C: 36 mg/dL — ABNORMAL LOW (ref >=40)
LDL (calc): 68 mg/dL (ref <100)
LDL Particle Number: 1199 nmol/L — ABNORMAL HIGH (ref <1000)
LDL Size: 20.1 nm — ABNORMAL LOW (ref >20.5)
LP-IR Score: 67 — ABNORMAL HIGH (ref <=45)
Large HDL-P: 2.9 umol/L — ABNORMAL LOW (ref >=4.8)
Large VLDL-P: 2.9 nmol/L — ABNORMAL HIGH (ref <=2.7)
Small LDL Particle Number: 769 nmol/L — ABNORMAL HIGH (ref <=527)
Triglycerides: 83 mg/dL (ref <150)
VLDL Size: 49.4 nm — ABNORMAL HIGH (ref <=46.6)

## 2012-08-17 NOTE — Progress Notes (Signed)
Quick Note:  Lab result at goal. No change in Medications for now. No Change in plans and follow up. ______ 

## 2012-09-04 ENCOUNTER — Emergency Department (HOSPITAL_COMMUNITY): Payer: Medicare Other

## 2012-09-04 ENCOUNTER — Emergency Department (HOSPITAL_COMMUNITY)
Admission: EM | Admit: 2012-09-04 | Discharge: 2012-09-04 | Disposition: A | Discharge status: Home / Self Care | Payer: Medicare Other | Attending: Emergency Medicine | Admitting: Emergency Medicine

## 2012-09-04 ENCOUNTER — Encounter (HOSPITAL_COMMUNITY): Payer: Self-pay | Admitting: Emergency Medicine

## 2012-09-04 DIAGNOSIS — Z951 Presence of aortocoronary bypass graft: Secondary | ICD-10-CM | POA: Insufficient documentation

## 2012-09-04 DIAGNOSIS — Z87891 Personal history of nicotine dependence: Secondary | ICD-10-CM | POA: Insufficient documentation

## 2012-09-04 DIAGNOSIS — S92001A Unspecified fracture of right calcaneus, initial encounter for closed fracture: Secondary | ICD-10-CM

## 2012-09-04 DIAGNOSIS — M1990 Unspecified osteoarthritis, unspecified site: Secondary | ICD-10-CM | POA: Insufficient documentation

## 2012-09-04 DIAGNOSIS — Z8701 Personal history of pneumonia (recurrent): Secondary | ICD-10-CM | POA: Insufficient documentation

## 2012-09-04 DIAGNOSIS — Z862 Personal history of diseases of the blood and blood-forming organs and certain disorders involving the immune mechanism: Secondary | ICD-10-CM | POA: Insufficient documentation

## 2012-09-04 DIAGNOSIS — Z87448 Personal history of other diseases of urinary system: Secondary | ICD-10-CM | POA: Insufficient documentation

## 2012-09-04 DIAGNOSIS — S92009A Unspecified fracture of unspecified calcaneus, initial encounter for closed fracture: Secondary | ICD-10-CM | POA: Insufficient documentation

## 2012-09-04 DIAGNOSIS — Z7982 Long term (current) use of aspirin: Secondary | ICD-10-CM | POA: Insufficient documentation

## 2012-09-04 DIAGNOSIS — E119 Type 2 diabetes mellitus without complications: Secondary | ICD-10-CM | POA: Insufficient documentation

## 2012-09-04 DIAGNOSIS — I10 Essential (primary) hypertension: Secondary | ICD-10-CM | POA: Insufficient documentation

## 2012-09-04 DIAGNOSIS — I2510 Atherosclerotic heart disease of native coronary artery without angina pectoris: Secondary | ICD-10-CM | POA: Insufficient documentation

## 2012-09-04 DIAGNOSIS — E785 Hyperlipidemia, unspecified: Secondary | ICD-10-CM | POA: Insufficient documentation

## 2012-09-04 DIAGNOSIS — M109 Gout, unspecified: Secondary | ICD-10-CM | POA: Insufficient documentation

## 2012-09-04 DIAGNOSIS — Z8719 Personal history of other diseases of the digestive system: Secondary | ICD-10-CM | POA: Insufficient documentation

## 2012-09-04 DIAGNOSIS — Z8679 Personal history of other diseases of the circulatory system: Secondary | ICD-10-CM | POA: Insufficient documentation

## 2012-09-04 DIAGNOSIS — Y929 Unspecified place or not applicable: Secondary | ICD-10-CM | POA: Insufficient documentation

## 2012-09-04 DIAGNOSIS — Z8673 Personal history of transient ischemic attack (TIA), and cerebral infarction without residual deficits: Secondary | ICD-10-CM | POA: Insufficient documentation

## 2012-09-04 DIAGNOSIS — Z8669 Personal history of other diseases of the nervous system and sense organs: Secondary | ICD-10-CM | POA: Insufficient documentation

## 2012-09-04 DIAGNOSIS — Z79899 Other long term (current) drug therapy: Secondary | ICD-10-CM | POA: Insufficient documentation

## 2012-09-04 DIAGNOSIS — Y9301 Activity, walking, marching and hiking: Secondary | ICD-10-CM | POA: Insufficient documentation

## 2012-09-04 DIAGNOSIS — X500XXA Overexertion from strenuous movement or load, initial encounter: Secondary | ICD-10-CM | POA: Insufficient documentation

## 2012-09-04 DIAGNOSIS — Z87440 Personal history of urinary (tract) infections: Secondary | ICD-10-CM | POA: Insufficient documentation

## 2012-09-04 NOTE — ED Notes (Signed)
Pt rolled r foot and heard a pop. Has been ambulatory. Nad. Pt c/o pain to back of heel. Bilateral ankle swelling, mild that is chronic.

## 2012-09-04 NOTE — ED Provider Notes (Signed)
Medical screening examination/treatment/procedure(s) were conducted as a shared visit with non-physician practitioner(s) and myself.  I personally evaluated the patient during the encounter    Patient seen by me. Doubt patient with an old calcaneus injury many years ago to the right foot. Patient with recent injury here recently may just be an ankle sprain but also has calcaneus pain. Has swelling to the lateral aspect of the right ankle tenderness to palpation to the posterior aspect of the calcaneus. Plain films of that area were not helpful in determining an acute fracture. We'll do a CT scan. The CT scan negative for acute injury will treat for ankle sprain and referred to orthopedics wife is followed by [facility] orthopedics and most likely will have him followup with them.  Results for orders placed in visit on 08/09/12  COMPLETE METABOLIC PANEL WITH GFR      Result Value Range   Sodium 134 (*) 135 - 145 mEq/L   Potassium 4.7  3.5 - 5.3 mEq/L   Chloride 98  96 - 112 mEq/L   CO2 28  19 - 32 mEq/L   Glucose, Bld 94  70 - 99 mg/dL   BUN 15  6 - 23 mg/dL   Creat 2.13  0.86 - 5.78 mg/dL   Total Bilirubin 0.4  0.3 - 1.2 mg/dL   Alkaline Phosphatase 38 (*) 39 - 117 U/L   AST 18  0 - 37 U/L   ALT 16  0 - 53 U/L   Total Protein 7.2  6.0 - 8.3 g/dL   Albumin 4.1  3.5 - 5.2 g/dL   Calcium 9.1  8.4 - 46.9 mg/dL   GFR, Est African American 69     GFR, Est Non African American 60    NMR LIPOPROFILE WITH LIPIDS      Result Value Range   LDL Particle Number TEST NOT PERFORMED  <1000 nmol/L   LDL (calc) TEST NOT PERFORMED  <100 mg/dL   HDL-C TEST NOT PERFORMED  >=62 mg/dL   Triglycerides TEST NOT PERFORMED  <150 mg/dL   Cholesterol, Total TEST NOT PERFORMED  <200 mg/dL   HDL Particle Number TEST NOT PERFORMED  >=30.5 umol/L   Large HDL-P TEST NOT PERFORMED  >=9.5 umol/L   Large VLDL-P TEST NOT PERFORMED  <=2.8 nmol/L   Small LDL Particle Number TEST NOT PERFORMED  <=527 nmol/L   LDL Size  TEST NOT PERFORMED  >20.5 nm   HDL Size TEST NOT PERFORMED  >=9.2 nm   VLDL Size TEST NOT PERFORMED  <=46.6 nm   LP-IR Score TEST NOT PERFORMED  <=45  NMR LIPOPROFILE WITH LIPIDS      Result Value Range   LDL Particle Number [id number] (*) <1000 nmol/L   LDL (calc) 68  <100 mg/dL   HDL-C 36 (*) >=41 mg/dL   Triglycerides 83  <324 mg/dL   Cholesterol, Total 401  <200 mg/dL   HDL Particle Number 02.7 (*) >=30.5 umol/L   Large HDL-P 2.9 (*) >=4.8 umol/L   Large VLDL-P 2.9 (*) <=2.7 nmol/L   Small LDL Particle Number [id number] (*) <=527 nmol/L   LDL Size 20.1 (*) >20.5 nm   HDL Size 8.7 (*) >=9.2 nm   VLDL Size 49.4 (*) <=46.6 nm   LP-IR Score 67 (*) <=45  POCT GLYCOSYLATED HEMOGLOBIN (HGB A1C)      Result Value Range   Hemoglobin A1C 6.1    POCT UA - MICROALBUMIN      Result Value Range  Microalbumin Ur, POC neg     Dg Foot Complete Right  09/04/2012   *RADIOLOGY REPORT*  Clinical Data: Foot pain.  Remote fracture.  RIGHT FOOT COMPLETE - 3+ VIEW  Comparison: None.  Findings: Alignment at the Lisfranc joint unremarkable.  Phalanges and metatarsals appear intact.  Collapsed and irregularly sclerotic appearance of the calcaneus noted, with significant deformity likely due to remote fracture. Low negative predictive value for acute calcaneal injury.  There is degenerative arthropathy along the subtalar joints and calcaneocuboid articulation.  IMPRESSION:  1.  Extensive deformity and sclerosis in the calcaneus, probably the result of old fracture, with secondary advanced degenerative arthropathy in the subtalar joints and calcaneocuboid articulation. No prior comparison exams - today's radiographs have a low negative predictive value for acute calcaneal injury.  Consider MRI if calcaneal workup is of particular clinical concern.   Original Report Authenticated By: Gaylyn Rong, M.D.      Shelda Jakes, MD 09/04/12 2006

## 2012-09-04 NOTE — ED Provider Notes (Signed)
History    CSN: [id number] Arrival date & time 09/04/12  1820  First MD Initiated Contact with Patient 09/04/12 1831     Chief Complaint  Patient presents with  . Ankle Pain   (Consider location/radiation/quality/duration/timing/severity/associated sxs/prior Treatment) HPI Comments: [name] is a 79 y.o. male who presents to the Emergency Department complaining of pain to the right foot.  States that he was walking on an uneven surface and felt a sudden pain and heard a "popping sound" to his foot.  Since that time, he has increasing pain with attempted weight bearing and bruising around the heel of his foot.  He denies fall.  Has applied ice without relief and took ibuprofen PTA.  Patient reports hx of calcaneal fracture in the 1980's.    The history is provided by the patient and the spouse.   Past Medical History  Diagnosis Date  . Diabetes mellitus type 2   . GERD (gastroesophageal reflux disease)   . Hyperlipidemia     takes Niacin nightly  . DJD (degenerative joint disease)   . Urosepsis   . Hypertension     takes Metoprolol and Ramipril daily  . Dizziness   . Gout     hx of--33yrs ago  . Itching     on right leg  . Bruises easily     pt is on Plavix and ASA  . Shortness of breath     wakes pt up during night with occ shortness of breathe;states it happens about once a month  . Impaired hearing   . Urinary frequency   . UTI (lower urinary tract infection)     hx of--03/2010  . Anemia     takes Glyburide bid  . CAD (coronary artery disease) of artery bypass graft   . Peripheral vascular disease   . S/P carotid endarterectomy   . Pneumonia     "at least twice that I know of"  . Seizures 05/14/11    "first time was today"  . CVA (cerebral vascular accident) ~ 2009    right arm/leg occ hurts  . Carotid artery occlusion    Past Surgical History  Procedure Laterality Date  . Appendectomy  1968  . Colonoscopy    . Endarterectomy  04/09/2011    Procedure:  ENDARTERECTOMY CAROTID;  Surgeon: Chuck Hint, MD;  Location: [facility] OR;  Service: Vascular;  Laterality: Left;  Left Carotid endarterectomy With Dacron Patch Angioplasty  . Cardiac catheterization  1998    dr hochrin  . Endarterectomy  05/08/2011    Procedure: ENDARTERECTOMY CAROTID;  Surgeon: Chuck Hint, MD;  Location: [facility] OR;  Service: Vascular;  Laterality: Right;  with [place] Patch Angioplasty  . Cataract extraction w/ intraocular lens  implant, bilateral  2012  . Coronary artery bypass graft  1998    LIMA to LAD, SVG to diagonal, SVG to PDA, SVG to posterior lateral.   . Eye surgery  1953    right ; "piece of metal removed"  . Eye surgery  2012    Cataract- Bilateral  . Carotid endarterectomy     Family History  Problem Relation Age of Onset  . Diabetes Sister   . Cancer Brother   . Diabetes Brother   . Anesthesia problems Neg Hx   . Hypotension Neg Hx   . Malignant hyperthermia Neg Hx   . Pseudochol deficiency Neg Hx   . Hearing loss Father    History  Substance Use Topics  .  Smoking status: Former Smoker -- 1.00 packs/day for 20 years    Types: Cigarettes    Quit date: 03/02/1974  . Smokeless tobacco: Never Used  . Alcohol Use: No     Comment: quit in 1981    Review of Systems  Constitutional: Negative for fever and chills.  Genitourinary: Negative for dysuria and difficulty urinating.  Musculoskeletal: Positive for joint swelling and arthralgias.  Skin: Negative for color change and wound.  All other systems reviewed and are negative.    Allergies  Fexofenadine hcl; Flonase; and Nasal spray  Home Medications   Current Outpatient Rx  Name  Route  Sig  Dispense  Refill  . aspirin EC 81 MG EC tablet   Oral   Take 1 tablet (81 mg total) by mouth 2 (two) times daily.         . calcium-vitamin D (OSCAL WITH D) 250-125 MG-UNIT per tablet   Oral   Take 1 tablet by mouth 2 (two) times daily.          . Cholecalciferol (VITAMIN D-3  PO)   Oral   Take 600 mg by mouth daily.         . clopidogrel (PLAVIX) 75 MG tablet   Oral   Take 1 tablet (75 mg total) by mouth daily.   90 tablet   3   . fish oil-omega-3 fatty acids 1000 MG capsule   Oral   Take 1 g by mouth daily.          . Flaxseed, Linseed, (FLAX SEED OIL PO)   Oral   Take 1,300 mg by mouth daily.          Marland Kitchen glyBURIDE (DIABETA) 2.5 MG tablet   Oral   Take 2.5 mg by mouth 2 (two) times daily with a meal.          . meloxicam (MOBIC) 15 MG tablet   Oral   Take 15 mg by mouth daily.         . metoprolol (LOPRESSOR) 50 MG tablet   Oral   Take 0.5 tablets (25 mg total) by mouth 2 (two) times daily.   90 tablet   3   . Multiple Vitamin (MULITIVITAMIN WITH MINERALS) TABS   Oral   Take 1 tablet by mouth daily.         . niacin (NIASPAN) 1000 MG CR tablet   Oral   Take 1 tablet (1,000 mg total) by mouth at bedtime.   90 tablet   3   . ramipril (ALTACE) 2.5 MG tablet   Oral   Take 1 tablet (2.5 mg total) by mouth daily.   90 tablet   3   . simvastatin (ZOCOR) 20 MG tablet   Oral   Take 1 tablet (20 mg total) by mouth at bedtime.   90 tablet   3   . Tamsulosin HCl (FLOMAX) 0.4 MG CAPS   Oral   Take 0.4 mg by mouth daily.          BP 187/74  Pulse 69  Temp(Src) 98 F (36.7 C) (Oral)  Resp 20  Ht 5\' 11"  (1.803 m)  Wt 200 lb (90.719 kg)  BMI 27.91 kg/m2  SpO2 99% Physical Exam  Nursing note and vitals reviewed. Constitutional: He is oriented to person, place, and time. He appears well-developed and well-nourished. No distress.  HENT:  Head: Normocephalic and atraumatic.  Cardiovascular: Normal rate, regular rhythm, normal heart sounds and intact distal pulses.  No murmur heard. Pulmonary/Chest: Effort normal and breath sounds normal. No respiratory distress.  Musculoskeletal: He exhibits tenderness.  ttp of the right lateral and plantar surface of the right foot over the calcaneus.Mild STS and ecchymosis.   DP  pulse is brisk, distal sensation intact.  No erythema, abrasion, or bony deformity.  No proximal tenderness  Neurological: He is alert and oriented to person, place, and time. He exhibits normal muscle tone. Coordination normal.  Skin: Skin is warm and dry.    ED Course  Procedures (including critical care time) Labs Reviewed - No data to display Ct Foot Right Wo Contrast  09/04/2012   *RADIOLOGY REPORT*  Clinical Data: Right ankle/heel injury with pain and swelling. Remote fracture.  CT OF THE RIGHT FOOT WITHOUT CONTRAST  Technique:  Multidetector CT imaging was performed according to the standard protocol. Multiplanar CT image reconstructions were also generated.  Comparison: 09/04/2012  Findings: Extensive deformity and chronic collapse of the calcaneus noted with prominent spurring.  There is prominent secondary degenerative arthropathy the subtalar joints and calcaneocuboid articulation, along with mixed sclerosis and lucency in the calcaneus.  Superimposed on this chronic deformity is acute obliquely oriented fracture of the posterior calcaneus extending from the posterior calcaneal margin to the posterior subtalar facet disc ridging noted at on parasagittal images such as images 45 through six of series 5.  The fracture is nondisplaced. The Achilles tendon attaches to the smaller posterior fracture fragment.  Mild tibiotalar spurring noted medially.  Faint punctate calcifications in the tibiotalar joint on images 149 longer 13 may be from mild chondrocalcinosis or tiny fragments loose in the joint.  There is a tibiotalar joint effusion.  Lisfranc joint intact.  No additional acute fracture identified.  There is mild spurring along the peroneal tubercle.  No entrapment of the flexor tendons along the fracture site.  IMPRESSION:  1.  Acute posterior oblique fracture of the calcaneus extends from the posterior inferior calcaneal edge to the posterior subtalar facet.  Accordingly the smaller posterior  fragment includes the entire attachment of the Achilles tendon.  The fracture is relatively nondisplaced. 2.  Extensive calcaneal deformity from prior fractures, with secondary advanced osteoarthritis of the subtalar joints and calcaneocuboid joint. 3.  Cannot exclude tiny punctate free osteochondral fragments in the tibiotalar joint.  There is a tibiotalar joint effusion.   Original Report Authenticated By: Gaylyn Rong, M.D.   Dg Foot Complete Right  09/04/2012   *RADIOLOGY REPORT*  Clinical Data: Foot pain.  Remote fracture.  RIGHT FOOT COMPLETE - 3+ VIEW  Comparison: None.  Findings: Alignment at the Lisfranc joint unremarkable.  Phalanges and metatarsals appear intact.  Collapsed and irregularly sclerotic appearance of the calcaneus noted, with significant deformity likely due to remote fracture. Low negative predictive value for acute calcaneal injury.  There is degenerative arthropathy along the subtalar joints and calcaneocuboid articulation.  IMPRESSION:  1.  Extensive deformity and sclerosis in the calcaneus, probably the result of old fracture, with secondary advanced degenerative arthropathy in the subtalar joints and calcaneocuboid articulation. No prior comparison exams - today's radiographs have a low negative predictive value for acute calcaneal injury.  Consider MRI if calcaneal workup is of particular clinical concern.   Original Report Authenticated By: Gaylyn Rong, M.D.   Posterior splint applied, remains NV intact.  Pain improved.  MDM  X-ray of foot is inconclusive.  Given pt hx of previous calcaneal fx, injury is concerning for fx so Will order CT scan.   Patient also  seen by EDP and CT scan of foot ordered.  Patient is NV intact.  No proximal tenderness.   Consulted Dr. Dion Saucier.  Recommended non-weight bearing, posterior splint and close f/u with orthopedic physician of choice.  Patient prefers f/u with a local orthopedist, and crutches vs wheelchair.   Pt is feeling  better and ambulated from the dept with crutches.  Gait was steady.    Posterior splint applied, pain improved.  Remains NV intact  Levie Owensby L. Lashell Moffitt, PA-C 09/06/12 1254

## 2012-09-05 ENCOUNTER — Telehealth: Payer: Self-pay | Admitting: Family Medicine

## 2012-09-05 NOTE — Telephone Encounter (Signed)
Spoke with pt and was seen at [facility] last nite. Has fractured heel and has immoblizer. appt given for 7-11-14with Dr Modesto Charon  and advised to call prn or if having difficulty with heel. Pt verbalized understanding and stated Friday appt would work better with his sch.

## 2012-09-07 NOTE — ED Provider Notes (Signed)
Medical screening examination/treatment/procedure(s) were conducted as a shared visit with non-physician practitioner(s) and myself.  I personally evaluated the patient during the encounter    Shelda Jakes, MD 09/07/12 [id number]

## 2012-09-09 ENCOUNTER — Encounter: Payer: Self-pay | Admitting: Family Medicine

## 2012-09-09 ENCOUNTER — Ambulatory Visit (INDEPENDENT_AMBULATORY_CARE_PROVIDER_SITE_OTHER): Payer: Medicare Other | Admitting: Family Medicine

## 2012-09-09 VITALS — BP 160/70 | HR 89 | Temp 97.4°F | Wt 212.0 lb

## 2012-09-09 DIAGNOSIS — S92009A Unspecified fracture of unspecified calcaneus, initial encounter for closed fracture: Secondary | ICD-10-CM | POA: Insufficient documentation

## 2012-09-09 DIAGNOSIS — S92001A Unspecified fracture of right calcaneus, initial encounter for closed fracture: Secondary | ICD-10-CM

## 2012-09-09 NOTE — Progress Notes (Signed)
Patient ID: [name], male   DOB: Sep 18, 1931, 79 y.o.   MRN: [id number] SUBJECTIVE: CC: Chief Complaint  Patient presents with  . Follow-up    FX CALCALNEA LRIGHT HEEL PER DR KEELING NEEDS REFERRAL TO SOMEONE OTHER THAN ORTHROPEDIIC    HPI: As above. Heel is  Doing well . It is in a wrapping.needs referral to [place] [facility].  Past Medical History  Diagnosis Date  . Diabetes mellitus type 2   . GERD (gastroesophageal reflux disease)   . Hyperlipidemia     takes Niacin nightly  . DJD (degenerative joint disease)   . Urosepsis   . Hypertension     takes Metoprolol and Ramipril daily  . Dizziness   . Gout     hx of--93yrs ago  . Itching     on right leg  . Bruises easily     pt is on Plavix and ASA  . Shortness of breath     wakes pt up during night with occ shortness of breathe;states it happens about once a month  . Impaired hearing   . Urinary frequency   . UTI (lower urinary tract infection)     hx of--03/2010  . Anemia     takes Glyburide bid  . CAD (coronary artery disease) of artery bypass graft   . Peripheral vascular disease   . S/P carotid endarterectomy   . Pneumonia     "at least twice that I know of"  . Seizures 05/14/11    "first time was today"  . CVA (cerebral vascular accident) ~ 2009    right arm/leg occ hurts  . Carotid artery occlusion    Past Surgical History  Procedure Laterality Date  . Appendectomy  1968  . Colonoscopy    . Endarterectomy  04/09/2011    Procedure: ENDARTERECTOMY CAROTID;  Surgeon: Chuck Hint, MD;  Location: [facility] OR;  Service: Vascular;  Laterality: Left;  Left Carotid endarterectomy With Dacron Patch Angioplasty  . Cardiac catheterization  1998    dr hochrin  . Endarterectomy  05/08/2011    Procedure: ENDARTERECTOMY CAROTID;  Surgeon: Chuck Hint, MD;  Location: [facility] OR;  Service: Vascular;  Laterality: Right;  with Heam[place]ield Patch Angioplasty  . Cataract extraction w/ intraocular lens  implant,  bilateral  2012  . Coronary artery bypass graft  1998    LIMA to LAD, SVG to diagonal, SVG to PDA, SVG to posterior lateral.   . Eye surgery  1953    right ; "piece of metal removed"  . Eye surgery  2012    Cataract- Bilateral  . Carotid endarterectomy     History   Social History  . Marital Status: Married    Spouse Name: N/A    Number of Children: N/A  . Years of Education: N/A   Occupational History  . retired     [employer]   Social History Main Topics  . Smoking status: Former Smoker -- 1.00 packs/day for 20 years    Types: Cigarettes    Quit date: 03/02/1974  . Smokeless tobacco: Never Used  . Alcohol Use: No     Comment: quit in 1981  . Drug Use: No  . Sexually Active: No   Other Topics Concern  . Not on file   Social History Narrative  . No narrative on file   Family History  Problem Relation Age of Onset  . Diabetes Sister   . Cancer Brother   . Diabetes Brother   .  Anesthesia problems Neg Hx   . Hypotension Neg Hx   . Malignant hyperthermia Neg Hx   . Pseudochol deficiency Neg Hx   . Hearing loss Father    Current Outpatient Prescriptions on File Prior to Visit  Medication Sig Dispense Refill  . aspirin EC 81 MG EC tablet Take 1 tablet (81 mg total) by mouth 2 (two) times daily.      . calcium-vitamin D (OSCAL WITH D) 250-125 MG-UNIT per tablet Take 1 tablet by mouth 2 (two) times daily.       . Cholecalciferol (VITAMIN D-3 PO) Take 600 mg by mouth daily.      . clopidogrel (PLAVIX) 75 MG tablet Take 1 tablet (75 mg total) by mouth daily.  90 tablet  3  . fish oil-omega-3 fatty acids 1000 MG capsule Take 1 g by mouth daily.       . Flaxseed, Linseed, (FLAX SEED OIL PO) Take 1,300 mg by mouth daily.       Marland Kitchen glyBURIDE (DIABETA) 2.5 MG tablet Take 2.5 mg by mouth 2 (two) times daily with a meal.       . meloxicam (MOBIC) 15 MG tablet Take 15 mg by mouth daily.      . metoprolol (LOPRESSOR) 50 MG tablet Take 0.5 tablets (25 mg total) by mouth 2 (two) times  daily.  90 tablet  3  . Multiple Vitamin (MULITIVITAMIN WITH MINERALS) TABS Take 1 tablet by mouth daily.      . niacin (NIASPAN) 1000 MG CR tablet Take 1 tablet (1,000 mg total) by mouth at bedtime.  90 tablet  3  . ramipril (ALTACE) 2.5 MG tablet Take 1 tablet (2.5 mg total) by mouth daily.  90 tablet  3  . simvastatin (ZOCOR) 20 MG tablet Take 1 tablet (20 mg total) by mouth at bedtime.  90 tablet  3  . Tamsulosin HCl (FLOMAX) 0.4 MG CAPS Take 0.4 mg by mouth daily.       No current facility-administered medications on file prior to visit.   Allergies  Allergen Reactions  . Fexofenadine Hcl Other (See Comments)    Heart attack symptoms  . Flonase (Fluticasone Propionate) Other (See Comments)    Heart attack symptoms  . Nasal Spray Other (See Comments)    Heart attack symptoms   . Nasal Spray     CAN NOT TOLERATE ANY NASAL SPRAYS!!!!!  Causes heart attack symptoms    There is no immunization history on file for this patient. Prior to Admission medications   Medication Sig Start Date End Date Taking? Authorizing Provider  aspirin EC 81 MG EC tablet Take 1 tablet (81 mg total) by mouth 2 (two) times daily. 03/21/12  Yes Rollene Rotunda, MD  calcium-vitamin D (OSCAL WITH D) 250-125 MG-UNIT per tablet Take 1 tablet by mouth 2 (two) times daily.    Yes Historical Provider, MD  Cholecalciferol (VITAMIN D-3 PO) Take 600 mg by mouth daily.   Yes Historical Provider, MD  clopidogrel (PLAVIX) 75 MG tablet Take 1 tablet (75 mg total) by mouth daily. 03/21/12  Yes Rollene Rotunda, MD  fish oil-omega-3 fatty acids 1000 MG capsule Take 1 g by mouth daily.    Yes Historical Provider, MD  Flaxseed, Linseed, (FLAX SEED OIL PO) Take 1,300 mg by mouth daily.    Yes Historical Provider, MD  glyBURIDE (DIABETA) 2.5 MG tablet Take 2.5 mg by mouth 2 (two) times daily with a meal.    Yes Historical Provider, MD  meloxicam (MOBIC) 15 MG tablet Take 15 mg by mouth daily.   Yes Historical Provider, MD   metoprolol (LOPRESSOR) 50 MG tablet Take 0.5 tablets (25 mg total) by mouth 2 (two) times daily. 03/21/12  Yes Rollene Rotunda, MD  Multiple Vitamin (MULITIVITAMIN WITH MINERALS) TABS Take 1 tablet by mouth daily.   Yes Historical Provider, MD  niacin (NIASPAN) 1000 MG CR tablet Take 1 tablet (1,000 mg total) by mouth at bedtime. 03/21/12  Yes Rollene Rotunda, MD  ramipril (ALTACE) 2.5 MG tablet Take 1 tablet (2.5 mg total) by mouth daily. 03/21/12  Yes Rollene Rotunda, MD  simvastatin (ZOCOR) 20 MG tablet Take 1 tablet (20 mg total) by mouth at bedtime. 03/21/12  Yes Rollene Rotunda, MD  Tamsulosin HCl (FLOMAX) 0.4 MG CAPS Take 0.4 mg by mouth daily.   Yes Historical Provider, MD     ROS: As above in the HPI. All other systems are stable or negative.  OBJECTIVE: APPEARANCE:  Patient in no acute distress.The patient appeared well nourished and normally developed. Acyanotic. Waist: VITAL SIGNS:BP 160/70  Pulse 89  Temp(Src) 97.4 F (36.3 C) (Oral)  Wt 212 lb (96.163 kg)  BMI 29.58 kg/m2 WM ambulating with crutches  SKIN: warm and  Dry without overt rashes, tattoos and scars  HEAD and Neck: without JVD, Head and scalp: normal Eyes:No scleral icterus. Fundi normal, eye movements normal. Ears: Auricle normal, canal normal, Tympanic membranes normal, insufflation normal. Nose: normal Throat: normal Neck & thyroid: normal EXTREMETIES: nonedematous. Both Femoral and Pedal pulses are normal.  MUSCULOSKELETAL:  Right foot and ankle in a Jones wrap type bracing  NEUROLOGIC: oriented to time,place and person; nonfocal.  ASSESSMENT: Calcaneal fracture, right, closed, initial encounter  PLAN: Orders Placed This Encounter  Procedures  . Ambulatory referral to Orthopedic Surgery    Referral Priority:  Routine    Referral Type:  Surgical    Referral Reason:  Specialty Services Required    Requested Specialty:  Orthopedic Surgery    Number of Visits Requested:  1    Continue with  crutches.  Return if symptoms worsen or fail to improve.  Alejandria Wessells P. Modesto Charon, M.D.

## 2012-10-24 ENCOUNTER — Other Ambulatory Visit: Payer: Self-pay | Admitting: *Deleted

## 2012-10-24 MED ORDER — GLYBURIDE 2.5 MG PO TABS: 2.5000 mg | ORAL_TABLET | Freq: Two times a day (BID) | ORAL | Status: DC

## 2012-12-09 ENCOUNTER — Encounter: Payer: Self-pay | Admitting: Family Medicine

## 2012-12-09 ENCOUNTER — Encounter (INDEPENDENT_AMBULATORY_CARE_PROVIDER_SITE_OTHER): Payer: Self-pay

## 2012-12-09 ENCOUNTER — Ambulatory Visit (INDEPENDENT_AMBULATORY_CARE_PROVIDER_SITE_OTHER): Payer: Medicare Other | Admitting: Family Medicine

## 2012-12-09 VITALS — BP 143/63 | HR 66 | Temp 97.0°F | Ht 72.0 in | Wt 213.6 lb

## 2012-12-09 DIAGNOSIS — I10 Essential (primary) hypertension: Secondary | ICD-10-CM

## 2012-12-09 DIAGNOSIS — E785 Hyperlipidemia, unspecified: Secondary | ICD-10-CM

## 2012-12-09 DIAGNOSIS — Z23 Encounter for immunization: Secondary | ICD-10-CM

## 2012-12-09 DIAGNOSIS — E119 Type 2 diabetes mellitus without complications: Secondary | ICD-10-CM

## 2012-12-09 DIAGNOSIS — I739 Peripheral vascular disease, unspecified: Secondary | ICD-10-CM

## 2012-12-09 DIAGNOSIS — I25810 Atherosclerosis of coronary artery bypass graft(s) without angina pectoris: Secondary | ICD-10-CM

## 2012-12-09 DIAGNOSIS — R569 Unspecified convulsions: Secondary | ICD-10-CM

## 2012-12-09 DIAGNOSIS — I6529 Occlusion and stenosis of unspecified carotid artery: Secondary | ICD-10-CM

## 2012-12-09 LAB — POCT GLYCOSYLATED HEMOGLOBIN (HGB A1C): Hemoglobin A1C: 6

## 2012-12-09 NOTE — Progress Notes (Signed)
Patient ID: [name], male   DOB: 1931/08/15, 79 y.o.   MRN: [id number] SUBJECTIVE: CC: Chief Complaint  Patient presents with  . Follow-up    4 month follow up non fasting     HPI: Doing great. Eating the same diet since childhood. No plans to change. No seizures. Sz was due to plaque from the carotid. Heel fracture healed  Patient is here for follow up of Diabetes Mellitus/HTN/HLD: Symptoms evaluated: Denies Nocturia ,Denies Urinary Frequency , denies Blurred vision ,deniesDizziness,denies.Dysuria,denies paresthesias, denies extremity pain or ulcers.Marland Kitchendenies chest pain. has had an annual eye exam. do check the feet. Does check CBGs. Average CBG:90s Denies episodes of hypoglycemia. Does have an emergency hypoglycemic plan. admits toCompliance with medications. Denies Problems with medications.  Past Medical History  Diagnosis Date  . Diabetes mellitus type 2   . GERD (gastroesophageal reflux disease)   . Hyperlipidemia     takes Niacin nightly  . DJD (degenerative joint disease)   . Urosepsis   . Hypertension     takes Metoprolol and Ramipril daily  . Dizziness   . Gout     hx of--24yrs ago  . Itching     on right leg  . Bruises easily     pt is on Plavix and ASA  . Shortness of breath     wakes pt up during night with occ shortness of breathe;states it happens about once a month  . Impaired hearing   . Urinary frequency   . UTI (lower urinary tract infection)     hx of--03/2010  . Anemia     takes Glyburide bid  . CAD (coronary artery disease) of artery bypass graft   . Peripheral vascular disease   . S/P carotid endarterectomy   . Pneumonia     "at least twice that I know of"  . Seizures 05/14/11    "first time was today"  . CVA (cerebral vascular accident) ~ 2009    right arm/leg occ hurts  . Carotid artery occlusion    Past Surgical History  Procedure Laterality Date  . Appendectomy  1968  . Colonoscopy    . Endarterectomy  04/09/2011   Procedure: ENDARTERECTOMY CAROTID;  Surgeon: Chuck Hint, MD;  Location: [facility] OR;  Service: Vascular;  Laterality: Left;  Left Carotid endarterectomy With Dacron Patch Angioplasty  . Cardiac catheterization  1998    dr hochrin  . Endarterectomy  05/08/2011    Procedure: ENDARTERECTOMY CAROTID;  Surgeon: Chuck Hint, MD;  Location: [facility] OR;  Service: Vascular;  Laterality: Right;  with Heamashield Patch Angioplasty  . Cataract extraction w/ intraocular lens  implant, bilateral  2012  . Coronary artery bypass graft  1998    LIMA to LAD, SVG to diagonal, SVG to PDA, SVG to posterior lateral.   . Eye surgery  1953    right ; "piece of metal removed"  . Eye surgery  2012    Cataract- Bilateral  . Carotid endarterectomy     History   Social History  . Marital Status: Married    Spouse Name: N/A    Number of Children: N/A  . Years of Education: N/A   Occupational History  . retired     [employer]   Social History Main Topics  . Smoking status: Former Smoker -- 1.00 packs/day for 20 years    Types: Cigarettes    Quit date: 03/02/1974  . Smokeless tobacco: Never Used  . Alcohol Use: No  Comment: quit in 1981  . Drug Use: No  . Sexual Activity: No   Other Topics Concern  . Not on file   Social History Narrative  . No narrative on file   Family History  Problem Relation Age of Onset  . Diabetes Sister   . Cancer Brother   . Diabetes Brother   . Anesthesia problems Neg Hx   . Hypotension Neg Hx   . Malignant hyperthermia Neg Hx   . Pseudochol deficiency Neg Hx   . Hearing loss Father    Current Outpatient Prescriptions on File Prior to Visit  Medication Sig Dispense Refill  . aspirin EC 81 MG EC tablet Take 1 tablet (81 mg total) by mouth 2 (two) times daily.      . calcium-vitamin D (OSCAL WITH D) 250-125 MG-UNIT per tablet Take 1 tablet by mouth 2 (two) times daily.       . Cholecalciferol (VITAMIN D-3 PO) Take 600 mg by mouth daily.      . clopidogrel  (PLAVIX) 75 MG tablet Take 1 tablet (75 mg total) by mouth daily.  90 tablet  3  . fish oil-omega-3 fatty acids 1000 MG capsule Take 1 g by mouth daily.       . Flaxseed, Linseed, (FLAX SEED OIL PO) Take 1,300 mg by mouth daily.       Marland Kitchen glyBURIDE (DIABETA) 2.5 MG tablet Take 1 tablet (2.5 mg total) by mouth 2 (two) times daily with a meal.  180 tablet  0  . meloxicam (MOBIC) 15 MG tablet Take 15 mg by mouth daily.      . metoprolol (LOPRESSOR) 50 MG tablet Take 0.5 tablets (25 mg total) by mouth 2 (two) times daily.  90 tablet  3  . Multiple Vitamin (MULITIVITAMIN WITH MINERALS) TABS Take 1 tablet by mouth daily.      . niacin (NIASPAN) 1000 MG CR tablet Take 1 tablet (1,000 mg total) by mouth at bedtime.  90 tablet  3  . ramipril (ALTACE) 2.5 MG tablet Take 1 tablet (2.5 mg total) by mouth daily.  90 tablet  3  . simvastatin (ZOCOR) 20 MG tablet Take 1 tablet (20 mg total) by mouth at bedtime.  90 tablet  3  . Tamsulosin HCl (FLOMAX) 0.4 MG CAPS Take 0.4 mg by mouth daily.       No current facility-administered medications on file prior to visit.   Allergies  Allergen Reactions  . Fexofenadine Hcl Other (See Comments)    Heart attack symptoms  . Flonase [Fluticasone Propionate] Other (See Comments)    Heart attack symptoms  . Nasal Spray Other (See Comments)    Heart attack symptoms   . Nasal Spray     CAN NOT TOLERATE ANY NASAL SPRAYS!!!!!  Causes heart attack symptoms   Immunization History  Administered Date(s) Administered  . Influenza,inj,Quad PF,36+ Mos 12/09/2012   Prior to Admission medications   Medication Sig Start Date End Date Taking? Authorizing Provider  aspirin EC 81 MG EC tablet Take 1 tablet (81 mg total) by mouth 2 (two) times daily. 03/21/12  Yes Rollene Rotunda, MD  calcium-vitamin D (OSCAL WITH D) 250-125 MG-UNIT per tablet Take 1 tablet by mouth 2 (two) times daily.    Yes Historical Provider, MD  Cholecalciferol (VITAMIN D-3 PO) Take 600 mg by mouth daily.    Yes Historical Provider, MD  clopidogrel (PLAVIX) 75 MG tablet Take 1 tablet (75 mg total) by mouth daily. 03/21/12  Yes Fayrene Fearing  Hochrein, MD  fish oil-omega-3 fatty acids 1000 MG capsule Take 1 g by mouth daily.    Yes Historical Provider, MD  Flaxseed, Linseed, (FLAX SEED OIL PO) Take 1,300 mg by mouth daily.    Yes Historical Provider, MD  glyBURIDE (DIABETA) 2.5 MG tablet Take 1 tablet (2.5 mg total) by mouth 2 (two) times daily with a meal. 10/24/12  Yes Ileana Ladd, MD  meloxicam (MOBIC) 15 MG tablet Take 15 mg by mouth daily.   Yes Historical Provider, MD  metoprolol (LOPRESSOR) 50 MG tablet Take 0.5 tablets (25 mg total) by mouth 2 (two) times daily. 03/21/12  Yes Rollene Rotunda, MD  Multiple Vitamin (MULITIVITAMIN WITH MINERALS) TABS Take 1 tablet by mouth daily.   Yes Historical Provider, MD  niacin (NIASPAN) 1000 MG CR tablet Take 1 tablet (1,000 mg total) by mouth at bedtime. 03/21/12  Yes Rollene Rotunda, MD  ramipril (ALTACE) 2.5 MG tablet Take 1 tablet (2.5 mg total) by mouth daily. 03/21/12  Yes Rollene Rotunda, MD  simvastatin (ZOCOR) 20 MG tablet Take 1 tablet (20 mg total) by mouth at bedtime. 03/21/12  Yes Rollene Rotunda, MD  Tamsulosin HCl (FLOMAX) 0.4 MG CAPS Take 0.4 mg by mouth daily.   Yes Historical Provider, MD     ROS: As above in the HPI. All other systems are stable or negative.  OBJECTIVE: APPEARANCE:  Patient in no acute distress.The patient appeared well nourished and normally developed. Acyanotic. Waist: VITAL SIGNS:BP 143/63  Pulse 66  Temp(Src) 97 F (36.1 C) (Oral)  Ht 6' (1.829 m)  Wt 213 lb 9.6 oz (96.888 kg)  BMI 28.96 kg/m2  WM  SKIN: warm and  Dry without overt rashes, tattoos and scars  HEAD and Neck: without JVD, Head and scalp: normal Eyes:No scleral icterus. Fundi normal, eye movements normal. Ears: Auricle normal, canal normal, Tympanic membranes normal, insufflation normal. Nose: normal Throat: normal Neck & thyroid: normal  CHEST &  LUNGS: Chest wall: normal Lungs: Clear  CVS: Reveals the PMI to be normally located. Regular rhythm, First and Second Heart sounds are normal,  absence of murmurs, rubs or gallops. Peripheral vasculature: Radial pulses: normal Dorsal pedis pulses: normal Posterior pulses: normal  ABDOMEN:  Appearance: central obesity Benign, no organomegaly, no masses, no Abdominal Aortic enlargement. No Guarding , no rebound. No Bruits. Bowel sounds: normal  RECTAL: N/A GU: N/A  EXTREMETIES: nonedematous.  MUSCULOSKELETAL:  Spine: normal Joints: intact  NEUROLOGIC: oriented to time,place and person; nonfocal. Strength is normal Sensory is normal Reflexes are normal Cranial Nerves are normal.  Results for orders placed in visit on 08/09/12  COMPLETE METABOLIC PANEL WITH GFR      Result Value Range   Sodium 134 (*) 135 - 145 mEq/L   Potassium 4.7  3.5 - 5.3 mEq/L   Chloride 98  96 - 112 mEq/L   CO2 28  19 - 32 mEq/L   Glucose, Bld 94  70 - 99 mg/dL   BUN 15  6 - 23 mg/dL   Creat 4.09  8.11 - 9.14 mg/dL   Total Bilirubin 0.4  0.3 - 1.2 mg/dL   Alkaline Phosphatase 38 (*) 39 - 117 U/L   AST 18  0 - 37 U/L   ALT 16  0 - 53 U/L   Total Protein 7.2  6.0 - 8.3 g/dL   Albumin 4.1  3.5 - 5.2 g/dL   Calcium 9.1  8.4 - 78.2 mg/dL   GFR, Est African American 69  GFR, Est Non African American 60    NMR LIPOPROFILE WITH LIPIDS      Result Value Range   LDL Particle Number TEST NOT PERFORMED  <1000 nmol/L   LDL (calc) TEST NOT PERFORMED  <100 mg/dL   HDL-C TEST NOT PERFORMED  >=16 mg/dL   Triglycerides TEST NOT PERFORMED  <150 mg/dL   Cholesterol, Total TEST NOT PERFORMED  <200 mg/dL   HDL Particle Number TEST NOT PERFORMED  >=30.5 umol/L   Large HDL-P TEST NOT PERFORMED  >=1.0 umol/L   Large VLDL-P TEST NOT PERFORMED  <=9.6 nmol/L   Small LDL Particle Number TEST NOT PERFORMED  <=527 nmol/L   LDL Size TEST NOT PERFORMED  >20.5 nm   HDL Size TEST NOT PERFORMED  >=9.2 nm   VLDL  Size TEST NOT PERFORMED  <=46.6 nm   LP-IR Score TEST NOT PERFORMED  <=45  NMR LIPOPROFILE WITH LIPIDS      Result Value Range   LDL Particle Number [id number] (*) <1000 nmol/L   LDL (calc) 68  <100 mg/dL   HDL-C 36 (*) >=04 mg/dL   Triglycerides 83  <540 mg/dL   Cholesterol, Total 981  <200 mg/dL   HDL Particle Number [id number] (*) >=30.5 umol/L   Large HDL-P 2.9 (*) >=4.8 umol/L   Large VLDL-P 2.9 (*) <=2.7 nmol/L   Small LDL Particle Number [id number] (*) <=527 nmol/L   LDL Size 20.1 (*) >20.5 nm   HDL Size 8.7 (*) >=9.2 nm   VLDL Size 49.4 (*) <=46.6 nm   LP-IR Score 67 (*) <=45  POCT GLYCOSYLATED HEMOGLOBIN (HGB A1C)      Result Value Range   Hemoglobin A1C 6.1    POCT UA - MICROALBUMIN      Result Value Range   Microalbumin Ur, POC neg      ASSESSMENT: Diabetes mellitus - Plan: CMP14+EGFR, POCT glycosylated hemoglobin (Hb A1C)  Hyperlipidemia - Plan: CMP14+EGFR, NMR, lipoprofile  CAD (coronary artery disease) of artery bypass graft  Need for prophylactic vaccination and inoculation against influenza  Hypertension - Plan: CMP14+EGFR  Peripheral vascular disease  Seizure  Occlusion and stenosis of carotid artery without mention of cerebral infarction, unspecified laterality    PLAN: Orders Placed This Encounter  Procedures  . CMP14+EGFR  . NMR, lipoprofile  . POCT glycosylated hemoglobin (Hb A1C)   Keep active  Same medications. No changes.  Could not convince patient to embark on a plant based diet to improve his outcomes.  Return in about 4 months (around 04/11/2013) for Recheck medical problems.  Elverta Dimiceli P. Modesto Charon, M.D.

## 2012-12-11 LAB — NMR, LIPOPROFILE
Cholesterol: 114 mg/dL (ref <200)
HDL Cholesterol by NMR: 35 mg/dL — ABNORMAL LOW (ref >=40)
HDL Particle Number: 27.2 umol/L — ABNORMAL LOW (ref >=30.5)
LDL Particle Number: 1501 nmol/L — ABNORMAL HIGH (ref <1000)
LDL Size: 19.5 nm — ABNORMAL LOW (ref >20.5)
LDLC SERPL CALC-MCNC: 65 mg/dL (ref <100)
LP-IR Score: 55 — ABNORMAL HIGH (ref <=45)
Small LDL Particle Number: 1262 nmol/L — ABNORMAL HIGH (ref <=527)
Triglycerides by NMR: 71 mg/dL (ref <150)

## 2012-12-11 LAB — CMP14+EGFR
ALT: 17 IU/L (ref 0–44)
AST: 18 IU/L (ref 0–40)
Albumin/Globulin Ratio: 1.5 (ref 1.1–2.5)
Albumin: 4.1 g/dL (ref 3.5–4.8)
Alkaline Phosphatase: 47 IU/L (ref 39–117)
BUN/Creatinine Ratio: 16 (ref 10–22)
BUN: 16 mg/dL (ref 8–27)
CO2: 26 mmol/L (ref 18–29)
Calcium: 9.1 mg/dL (ref 8.6–10.2)
Chloride: 97 mmol/L (ref 97–108)
Creatinine, Ser: 1.02 mg/dL (ref 0.76–1.27)
GFR calc Af Amer: 81 mL/min/{1.73_m2} (ref >59)
GFR calc non Af Amer: 70 mL/min/{1.73_m2} (ref >59)
Globulin, Total: 2.7 g/dL (ref 1.5–4.5)
Glucose: 92 mg/dL (ref 65–99)
Potassium: 5.4 mmol/L — ABNORMAL HIGH (ref 3.5–5.2)
Sodium: 138 mmol/L (ref 134–144)
Total Bilirubin: 0.3 mg/dL (ref 0.0–1.2)
Total Protein: 6.8 g/dL (ref 6.0–8.5)

## 2012-12-13 ENCOUNTER — Telehealth: Payer: Self-pay | Admitting: Family Medicine

## 2012-12-13 NOTE — Telephone Encounter (Signed)
Pt aware of labs  

## 2012-12-13 NOTE — Telephone Encounter (Signed)
Pt notified of labs

## 2012-12-18 ENCOUNTER — Other Ambulatory Visit: Payer: Self-pay | Admitting: Cardiology

## 2012-12-18 ENCOUNTER — Other Ambulatory Visit: Payer: Self-pay | Admitting: Family Medicine

## 2013-01-01 IMAGING — CT CT HEAD W/O CM
5 of 9 series · 12 of 40 positions shown, 13 images · non-contrast
Comparison: 05/10/2011 neck CT, 07/18/2007 head CT

CLINICAL DATA: Seizure,

CT HEAD WITHOUT CONTRAST,CT CERVICAL SPINE WITHOUT CONTRAST
TECHNIQUE: Contiguous axial images were obtained from the base of
the skull through the vertex without contrast.,Technique:
Multidetector CT imaging of the cervical spine was performed.
Multiplanar CT image reconstructions were also generated.

[Series 3: recon 2: brain · axial · 0.47mm/px · z∈[-58,-10]mm · 2 of 56 slices shown, 3 images]
[im 19/56  brain]
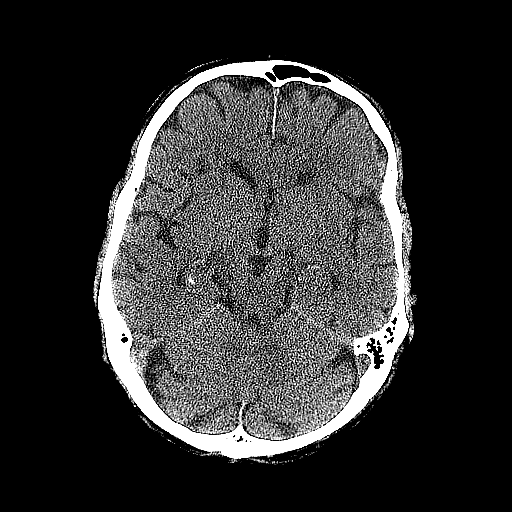
[im 19/56  bone]
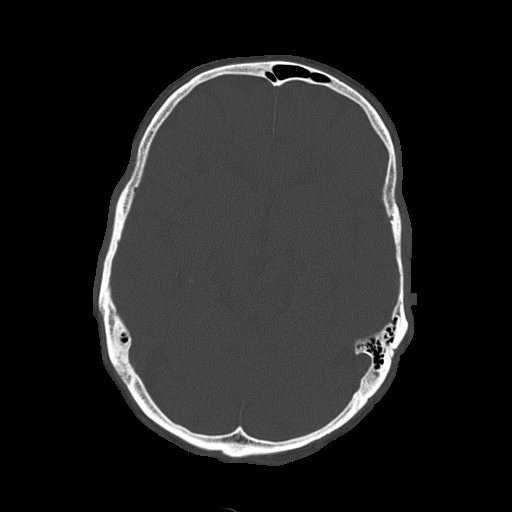
[im 37/56  brain]
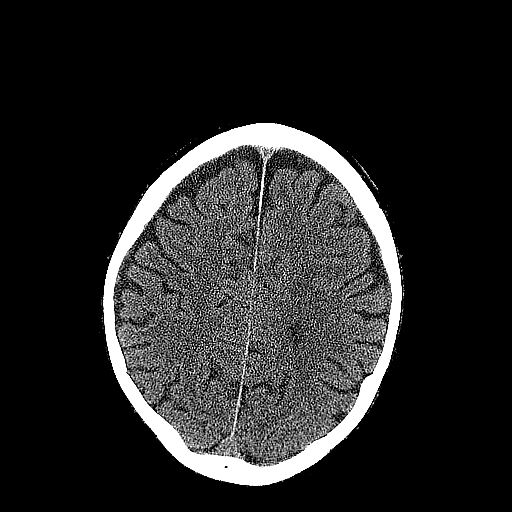

[Series 4: cervical spine · axial · 0.32mm/px · z∈[-232,-179]mm · 2 of 65 slices shown (1 of 2)]
[im 22/65  brain]
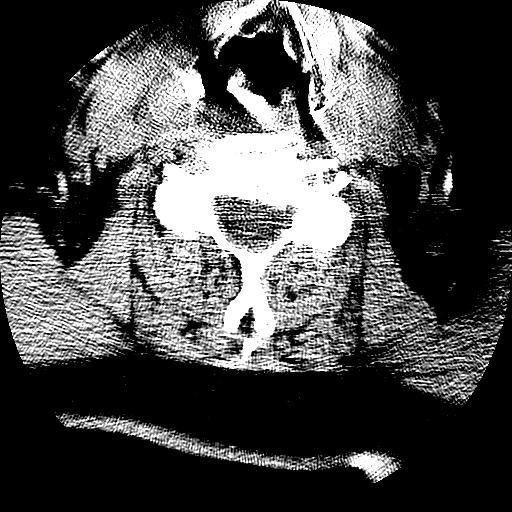
[im 43/65  brain]
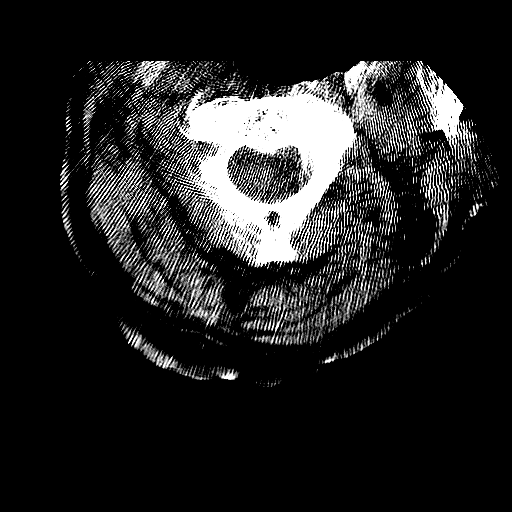

[Series 7: cervical spine · axial · 0.32mm/px · z∈[-269,-174]mm · 3 of 78 slices shown (2 of 2)]
[im 20/78  brain]
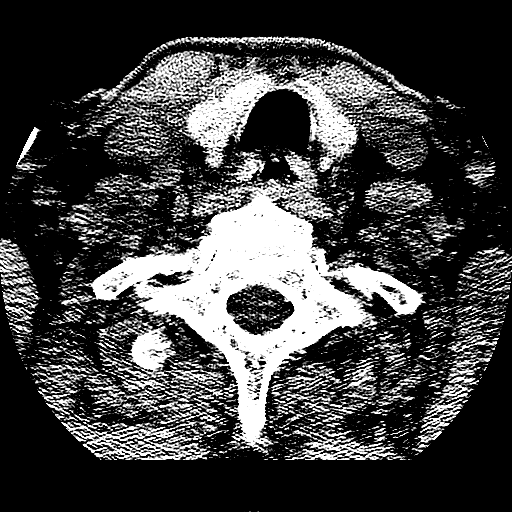
[im 39/78  brain]
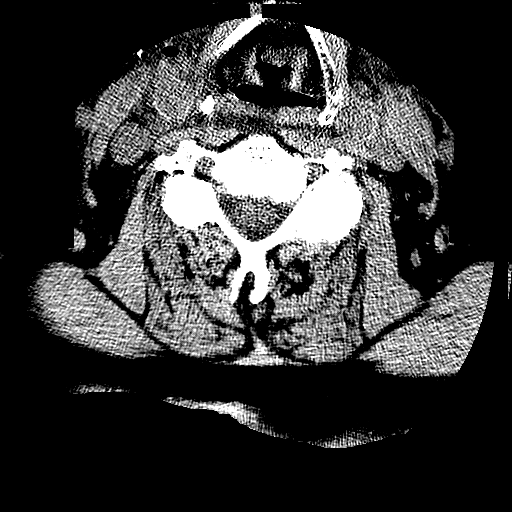
[im 58/78  brain]
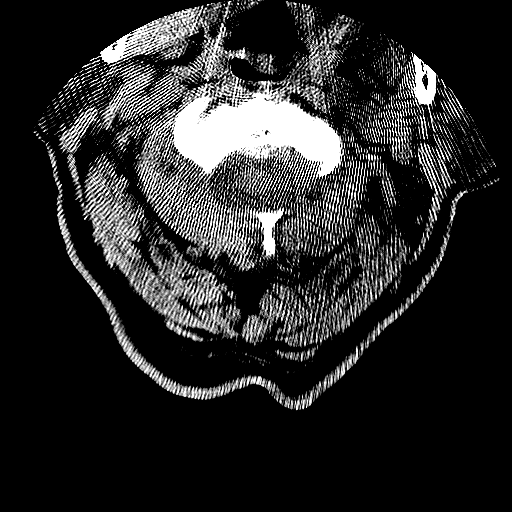

[Series 900: cor · coronal · 0.32mm/px · 3 of 31 slices shown]
[im 11/31  brain]
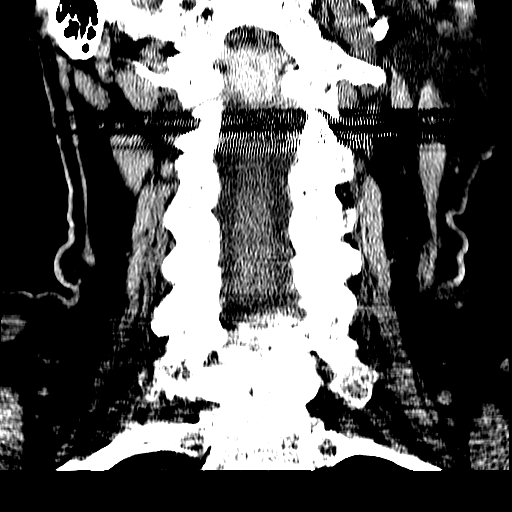
[im 14/31  brain]
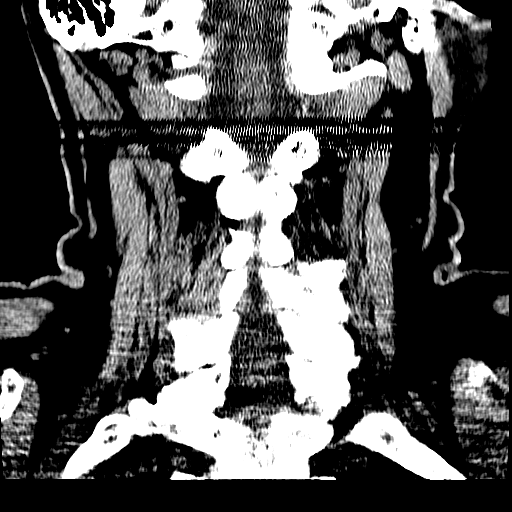
[im 17/31  brain]
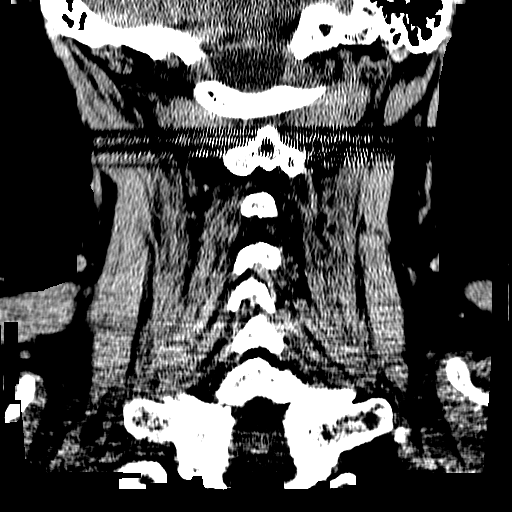

[Series 901: axial · axial · 0.23mm/px · z∈[-249,-202]mm · 2 of 52 slices shown]
[im 18/52  brain]
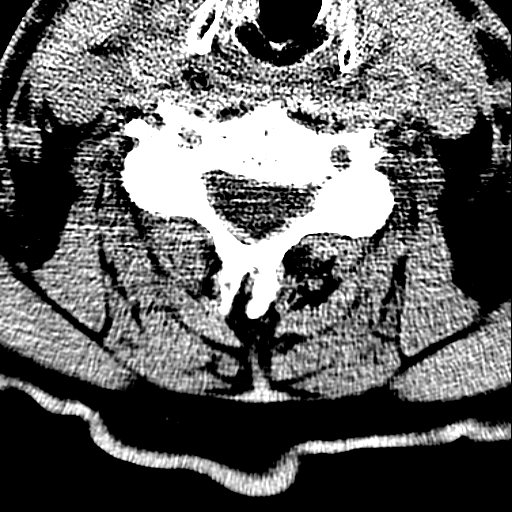
[im 35/52  brain]
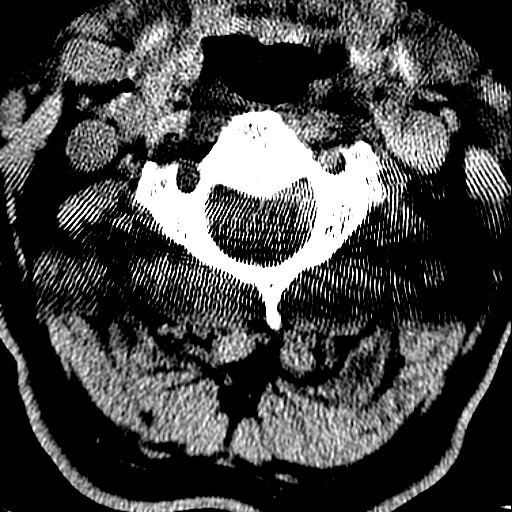

[12 of 40 positions shown; findings below may reference images not displayed]

FINDINGS: Prominence of the sulci, cisterns, and ventricles, in
keeping with volume loss. There are subcortical and periventricular
white matter hypodensities, a nonspecific finding most often seen
with chronic microangiopathic changes.

There is no evidence for acute hemorrhage, overt hydrocephalus,
mass lesion, or abnormal extra-axial fluid collection.  No definite
CT evidence for acute cortical based (large artery) infarction.
Remote appearing lacunar infarction within the right basal ganglia.

The visualized paranasal sinuses and mastoid air cells are
predominately clear.  No displaced calvarial fracture.

Cervical spine: Multilevel degenerative change.  No acute fracture
or dislocation identified.  No aggressive osseous lesion.  No
prevertebral soft tissue swelling.
IMPRESSION: Mild volume loss and white matter changes.  Remote appearing right
basal ganglia lacunar infarction.  No definite acute intracranial
abnormality.

Multilevel degenerative changes of the cervical spine.  No acute
fracture or dislocation identified.

## 2013-01-04 ENCOUNTER — Other Ambulatory Visit: Payer: Self-pay | Admitting: Family Medicine

## 2013-01-29 ENCOUNTER — Emergency Department (HOSPITAL_COMMUNITY)
Admission: EM | Admit: 2013-01-29 | Discharge: 2013-01-29 | Disposition: A | Discharge status: Home / Self Care | Payer: Medicare Other | Attending: Emergency Medicine | Admitting: Emergency Medicine

## 2013-01-29 ENCOUNTER — Encounter (HOSPITAL_COMMUNITY): Payer: Self-pay | Admitting: Emergency Medicine

## 2013-01-29 ENCOUNTER — Emergency Department (HOSPITAL_COMMUNITY): Payer: Medicare Other

## 2013-01-29 DIAGNOSIS — Z95818 Presence of other cardiac implants and grafts: Secondary | ICD-10-CM | POA: Insufficient documentation

## 2013-01-29 DIAGNOSIS — Z87891 Personal history of nicotine dependence: Secondary | ICD-10-CM | POA: Insufficient documentation

## 2013-01-29 DIAGNOSIS — Z791 Long term (current) use of non-steroidal anti-inflammatories (NSAID): Secondary | ICD-10-CM | POA: Insufficient documentation

## 2013-01-29 DIAGNOSIS — Z951 Presence of aortocoronary bypass graft: Secondary | ICD-10-CM | POA: Insufficient documentation

## 2013-01-29 DIAGNOSIS — I10 Essential (primary) hypertension: Secondary | ICD-10-CM | POA: Insufficient documentation

## 2013-01-29 DIAGNOSIS — R05 Cough: Secondary | ICD-10-CM | POA: Insufficient documentation

## 2013-01-29 DIAGNOSIS — Z8669 Personal history of other diseases of the nervous system and sense organs: Secondary | ICD-10-CM | POA: Insufficient documentation

## 2013-01-29 DIAGNOSIS — I2510 Atherosclerotic heart disease of native coronary artery without angina pectoris: Secondary | ICD-10-CM | POA: Insufficient documentation

## 2013-01-29 DIAGNOSIS — Z87440 Personal history of urinary (tract) infections: Secondary | ICD-10-CM | POA: Insufficient documentation

## 2013-01-29 DIAGNOSIS — F411 Generalized anxiety disorder: Secondary | ICD-10-CM | POA: Insufficient documentation

## 2013-01-29 DIAGNOSIS — Z872 Personal history of diseases of the skin and subcutaneous tissue: Secondary | ICD-10-CM | POA: Insufficient documentation

## 2013-01-29 DIAGNOSIS — Z8701 Personal history of pneumonia (recurrent): Secondary | ICD-10-CM | POA: Insufficient documentation

## 2013-01-29 DIAGNOSIS — M1990 Unspecified osteoarthritis, unspecified site: Secondary | ICD-10-CM | POA: Insufficient documentation

## 2013-01-29 DIAGNOSIS — R5381 Other malaise: Secondary | ICD-10-CM | POA: Insufficient documentation

## 2013-01-29 DIAGNOSIS — K219 Gastro-esophageal reflux disease without esophagitis: Secondary | ICD-10-CM | POA: Insufficient documentation

## 2013-01-29 DIAGNOSIS — R531 Weakness: Secondary | ICD-10-CM

## 2013-01-29 DIAGNOSIS — Z79899 Other long term (current) drug therapy: Secondary | ICD-10-CM | POA: Insufficient documentation

## 2013-01-29 DIAGNOSIS — E785 Hyperlipidemia, unspecified: Secondary | ICD-10-CM | POA: Insufficient documentation

## 2013-01-29 DIAGNOSIS — E119 Type 2 diabetes mellitus without complications: Secondary | ICD-10-CM | POA: Insufficient documentation

## 2013-01-29 DIAGNOSIS — R059 Cough, unspecified: Secondary | ICD-10-CM | POA: Insufficient documentation

## 2013-01-29 DIAGNOSIS — Z7982 Long term (current) use of aspirin: Secondary | ICD-10-CM | POA: Insufficient documentation

## 2013-01-29 DIAGNOSIS — R0602 Shortness of breath: Secondary | ICD-10-CM | POA: Insufficient documentation

## 2013-01-29 DIAGNOSIS — J3489 Other specified disorders of nose and nasal sinuses: Secondary | ICD-10-CM | POA: Insufficient documentation

## 2013-01-29 DIAGNOSIS — R42 Dizziness and giddiness: Secondary | ICD-10-CM | POA: Insufficient documentation

## 2013-01-29 DIAGNOSIS — Z9889 Other specified postprocedural states: Secondary | ICD-10-CM | POA: Insufficient documentation

## 2013-01-29 DIAGNOSIS — Z87448 Personal history of other diseases of urinary system: Secondary | ICD-10-CM | POA: Insufficient documentation

## 2013-01-29 DIAGNOSIS — Z8673 Personal history of transient ischemic attack (TIA), and cerebral infarction without residual deficits: Secondary | ICD-10-CM | POA: Insufficient documentation

## 2013-01-29 DIAGNOSIS — D649 Anemia, unspecified: Secondary | ICD-10-CM | POA: Insufficient documentation

## 2013-01-29 DIAGNOSIS — I739 Peripheral vascular disease, unspecified: Secondary | ICD-10-CM | POA: Insufficient documentation

## 2013-01-29 DIAGNOSIS — Z7902 Long term (current) use of antithrombotics/antiplatelets: Secondary | ICD-10-CM | POA: Insufficient documentation

## 2013-01-29 LAB — COMPREHENSIVE METABOLIC PANEL
ALT: 16 U/L (ref 0–53)
BUN: 20 mg/dL (ref 6–23)
CO2: 31 mEq/L (ref 19–32)
Calcium: 9.4 mg/dL (ref 8.4–10.5)
Creatinine, Ser: 1.24 mg/dL (ref 0.50–1.35)
GFR calc Af Amer: 62 mL/min — ABNORMAL LOW (ref >90)
GFR calc non Af Amer: 54 mL/min — ABNORMAL LOW (ref >90)
Glucose, Bld: 202 mg/dL — ABNORMAL HIGH (ref 70–99)
Sodium: 138 mEq/L (ref 135–145)
Total Protein: 7.3 g/dL (ref 6.0–8.3)

## 2013-01-29 LAB — URINALYSIS, ROUTINE W REFLEX MICROSCOPIC
Glucose, UA: 500 mg/dL — AB
Ketones, ur: NEGATIVE mg/dL
Leukocytes, UA: NEGATIVE
Nitrite: NEGATIVE
Protein, ur: NEGATIVE mg/dL
Urobilinogen, UA: 0.2 mg/dL (ref 0.0–1.0)

## 2013-01-29 LAB — CBC WITH DIFFERENTIAL/PLATELET
Eosinophils Absolute: 0.2 10*3/uL (ref 0.0–0.7)
Eosinophils Relative: 3 % (ref 0–5)
HCT: 39.3 % (ref 39.0–52.0)
Lymphocytes Relative: 24 % (ref 12–46)
Lymphs Abs: 1.8 10*3/uL (ref 0.7–4.0)
MCH: 31.2 pg (ref 26.0–34.0)
MCV: 93.6 fL (ref 78.0–100.0)
Monocytes Absolute: 0.5 10*3/uL (ref 0.1–1.0)
Platelets: 195 10*3/uL (ref 150–400)
RBC: 4.2 MIL/uL — ABNORMAL LOW (ref 4.22–5.81)
RDW: 13.9 % (ref 11.5–15.5)

## 2013-01-29 LAB — TROPONIN I: Troponin I: <0.3 ng/mL (ref <0.30)

## 2013-01-29 LAB — URINE MICROSCOPIC-ADD ON

## 2013-01-29 LAB — PRO B NATRIURETIC PEPTIDE: Pro B Natriuretic peptide (BNP): 95.1 pg/mL (ref 0–450)

## 2013-01-29 MED ORDER — LORAZEPAM 1 MG PO TABS
1.0000 mg | ORAL_TABLET | Freq: Once | ORAL | Status: AC
  Administered 2013-01-29: 1 mg via ORAL
  Filled 2013-01-29: qty 1

## 2013-01-29 MED ORDER — SODIUM CHLORIDE 0.9 % IV SOLN
INTRAVENOUS | Status: DC
  Administered 2013-01-29: 10 mL/h via INTRAVENOUS

## 2013-01-29 NOTE — ED Provider Notes (Signed)
CSN: [id number]     Arrival date & time 01/29/13  7829 History  This chart was scribed for Toy Baker, MD by Blanchard Kelch, ED Scribe. The patient was seen in room APA07/APA07. Patient's care was started at 7:14 PM.      Chief Complaint  Patient presents with  . Nasal Congestion  . Dizziness  . Weakness   Patient is a 79 y.o. male presenting with weakness. The history is provided by the patient. No language interpreter was used.  Weakness Associated symptoms include shortness of breath. Pertinent negatives include no chest pain.    HPI Comments: [name] is a 79 y.o. male who presents to the Emergency Department complaining of intermittent, worsening shortness of breath that began two weeks ago. He has associated congestion, productive cough with yellow/green sputum and dizziness with the congestion. The shortness of breath is worsened with lying down. The shortness of breath is relieved mildly by exertion. He reports weakness in his lower extremities when he has been sitting for awhile, but it subsides with exertion. He has had increased anxiety lately according to him and his relatives. He denies chest pain, fever, vomiting, dysuria, frequency. He has not been seen by a PCP for the symptoms.   His PCP is Dr. Modesto Charon in [place].   Past Medical History  Diagnosis Date  . Diabetes mellitus type 2   . GERD (gastroesophageal reflux disease)   . Hyperlipidemia     takes Niacin nightly  . DJD (degenerative joint disease)   . Urosepsis   . Hypertension     takes Metoprolol and Ramipril daily  . Dizziness   . Gout     hx of--34yrs ago  . Itching     on right leg  . Bruises easily     pt is on Plavix and ASA  . Shortness of breath     wakes pt up during night with occ shortness of breathe;states it happens about once a month  . Impaired hearing   . Urinary frequency   . UTI (lower urinary tract infection)     hx of--03/2010  . Anemia     takes Glyburide bid  . CAD  (coronary artery disease) of artery bypass graft   . Peripheral vascular disease   . S/P carotid endarterectomy   . Pneumonia     "at least twice that I know of"  . Seizures 05/14/11    "first time was today"  . CVA (cerebral vascular accident) ~ 2009    right arm/leg occ hurts  . Carotid artery occlusion    Past Surgical History  Procedure Laterality Date  . Appendectomy  1968  . Colonoscopy    . Endarterectomy  04/09/2011    Procedure: ENDARTERECTOMY CAROTID;  Surgeon: Chuck Hint, MD;  Location: [facility] OR;  Service: Vascular;  Laterality: Left;  Left Carotid endarterectomy With Dacron Patch Angioplasty  . Cardiac catheterization  1998    dr hochrin  . Endarterectomy  05/08/2011    Procedure: ENDARTERECTOMY CAROTID;  Surgeon: Chuck Hint, MD;  Location: [facility] OR;  Service: Vascular;  Laterality: Right;  with Heamashield Patch Angioplasty  . Cataract extraction w/ intraocular lens  implant, bilateral  2012  . Coronary artery bypass graft  1998    LIMA to LAD, SVG to diagonal, SVG to PDA, SVG to posterior lateral.   . Eye surgery  1953    right ; "piece of metal removed"  . Eye surgery  2012  Cataract- Bilateral  . Carotid endarterectomy     Family History  Problem Relation Age of Onset  . Diabetes Sister   . Cancer Brother   . Diabetes Brother   . Anesthesia problems Neg Hx   . Hypotension Neg Hx   . Malignant hyperthermia Neg Hx   . Pseudochol deficiency Neg Hx   . Hearing loss Father    History  Substance Use Topics  . Smoking status: Former Smoker -- 1.00 packs/day for 20 years    Types: Cigarettes    Quit date: 03/02/1974  . Smokeless tobacco: Never Used  . Alcohol Use: No     Comment: quit in 1981    Review of Systems  Constitutional: Negative for fever.  HENT: Positive for congestion.   Respiratory: Positive for cough and shortness of breath.   Cardiovascular: Negative for chest pain.  Gastrointestinal: Negative for vomiting.  Genitourinary:  Negative for dysuria and frequency.  Neurological: Positive for dizziness and weakness.  Psychiatric/Behavioral: The patient is nervous/anxious.   All other systems reviewed and are negative.    Allergies  Fexofenadine hcl; Flonase; Nasal spray; and Nasal spray  Home Medications   Current Outpatient Rx  Name  Route  Sig  Dispense  Refill  . alendronate (FOSAMAX) 35 MG tablet      TAKE 1 TABLET EVERY WEEK   4 tablet   5   . aspirin EC 81 MG EC tablet   Oral   Take 1 tablet (81 mg total) by mouth 2 (two) times daily.         . calcium-vitamin D (OSCAL WITH D) 250-125 MG-UNIT per tablet   Oral   Take 1 tablet by mouth 2 (two) times daily.          . Cholecalciferol (VITAMIN D-3 PO)   Oral   Take 600 mg by mouth daily.         . clopidogrel (PLAVIX) 75 MG tablet      TAKE 1 TABLET DAILY   90 tablet   0     Appointment with Dr Antoine Poche 03/2013   . fish oil-omega-3 fatty acids 1000 MG capsule   Oral   Take 1 g by mouth daily.          . Flaxseed, Linseed, (FLAX SEED OIL PO)   Oral   Take 1,300 mg by mouth daily.          Marland Kitchen glyBURIDE (DIABETA) 2.5 MG tablet      TAKE 1 TABLET TWICE A DAY WITH MEALS   180 tablet   0   . meloxicam (MOBIC) 15 MG tablet   Oral   Take 15 mg by mouth daily.         . metoprolol (LOPRESSOR) 50 MG tablet      TAKE ONE-HALF (1/2) TABLET (25 MG) TWICE A DAY   90 tablet   0     Appointment with Dr Antoine Poche 03/2013   . Multiple Vitamin (MULITIVITAMIN WITH MINERALS) TABS   Oral   Take 1 tablet by mouth daily.         . niacin (NIASPAN) 1000 MG CR tablet   Oral   Take 1 tablet (1,000 mg total) by mouth at bedtime.   90 tablet   3   . ramipril (ALTACE) 2.5 MG capsule      TAKE 1 CAPSULE DAILY   90 capsule   0     Appointment with Dr Antoine Poche 03/2013   . simvastatin (  ZOCOR) 20 MG tablet      TAKE 1 TABLET AT BEDTIME   90 tablet   0     Appointment with Dr Antoine Poche 03/2013   . Tamsulosin HCl (FLOMAX) 0.4  MG CAPS   Oral   Take 0.4 mg by mouth daily.          Triage Vitals: BP 153/79  Pulse 103  Temp(Src) 98.3 F (36.8 C) (Oral)  Resp 18  Ht 6' (1.829 m)  Wt 215 lb (97.523 kg)  BMI 29.15 kg/m2  SpO2 98%  Physical Exam  Nursing note and vitals reviewed. Constitutional: He is oriented to person, place, and time. He appears well-developed and well-nourished.  Non-toxic appearance. No distress.  HENT:  Head: Normocephalic and atraumatic.  Eyes: Conjunctivae, EOM and lids are normal. Pupils are equal, round, and reactive to light.  Neck: Normal range of motion. Neck supple. No tracheal deviation present. No mass present.  Cardiovascular: Normal rate, regular rhythm and normal heart sounds.  Exam reveals no gallop.   No murmur heard. Pulmonary/Chest: Effort normal. No stridor. No respiratory distress. He has no decreased breath sounds. He has no wheezes. He has rhonchi. He has rales.  Rales and rhonchi in right base.   Abdominal: Soft. Normal appearance and bowel sounds are normal. He exhibits no distension. There is no tenderness. There is no rebound and no CVA tenderness.  Musculoskeletal: Normal range of motion. He exhibits no edema and no tenderness.  No pedal edema.  Neurological: He is alert and oriented to person, place, and time. He has normal strength. No cranial nerve deficit or sensory deficit. GCS eye subscore is 4. GCS verbal subscore is 5. GCS motor subscore is 6.  Skin: Skin is warm and dry. No abrasion and no rash noted.  Psychiatric: He has a normal mood and affect. His speech is normal and behavior is normal.    ED Course  Procedures (including critical care time)  DIAGNOSTIC STUDIES: Oxygen Saturation is 98% on room air, normal by my interpretation.    COORDINATION OF CARE: 7:20 PM - Patient verbalizes understanding and agrees with treatment plan.    Labs Review Labs Reviewed  CBC WITH DIFFERENTIAL - Abnormal; Notable for the following:    RBC 4.20 (*)     All other components within normal limits  COMPREHENSIVE METABOLIC PANEL - Abnormal; Notable for the following:    Glucose, Bld 202 (*)    Total Bilirubin 0.2 (*)    GFR calc non Af Amer 54 (*)    GFR calc Af Amer 62 (*)    All other components within normal limits  URINALYSIS, ROUTINE W REFLEX MICROSCOPIC - Abnormal; Notable for the following:    Glucose, UA 500 (*)    Hgb urine dipstick TRACE (*)    All other components within normal limits  PRO B NATRIURETIC PEPTIDE  TROPONIN I  URINE MICROSCOPIC-ADD ON   Imaging Review No results found.  EKG Interpretation    Date/Time:  Sunday January 29 2013 19:24:25 EST Ventricular Rate:  94 PR Interval:  178 QRS Duration: 102 QT Interval:  364 QTC Calculation: 455 R Axis:   10 Text Interpretation:  Normal sinus rhythm Septal infarct , age undetermined Abnormal ECG No significant change since last tracing Confirmed by Diyana Starrett  MD, Sandy Haye ([id number]) on 01/29/2013 7:47:35 PM            MDM  No diagnosis found. Patient's nurse informed me that the patient admits that he has  been more depressed since his wife was recently diagnosed with severe neurologic disorder. States he has been more anxious and has used Ativan for this with good relief.  I personally performed the services described in this documentation, which was scribed in my presence. The recorded information has been reviewed and is accurate.  8:55 PM Patient's labs and x-rays reviewed. No acute findings noted. He is stable for discharge  Toy Baker, MD 01/29/13 2056

## 2013-01-29 NOTE — ED Notes (Signed)
Patient complaining of congestion, dizziness, intermittent shortness of breath, and generalized weakness x 2 weeks.

## 2013-01-30 ENCOUNTER — Ambulatory Visit (INDEPENDENT_AMBULATORY_CARE_PROVIDER_SITE_OTHER): Payer: Medicare Other | Admitting: Family Medicine

## 2013-01-30 ENCOUNTER — Encounter: Payer: Self-pay | Admitting: Family Medicine

## 2013-01-30 ENCOUNTER — Encounter (INDEPENDENT_AMBULATORY_CARE_PROVIDER_SITE_OTHER): Payer: Self-pay

## 2013-01-30 VITALS — BP 148/74 | HR 82 | Temp 98.6°F | Ht 71.0 in | Wt 213.4 lb

## 2013-01-30 DIAGNOSIS — E119 Type 2 diabetes mellitus without complications: Secondary | ICD-10-CM

## 2013-01-30 DIAGNOSIS — F4320 Adjustment disorder, unspecified: Secondary | ICD-10-CM | POA: Insufficient documentation

## 2013-01-30 DIAGNOSIS — I25810 Atherosclerosis of coronary artery bypass graft(s) without angina pectoris: Secondary | ICD-10-CM

## 2013-01-30 DIAGNOSIS — I10 Essential (primary) hypertension: Secondary | ICD-10-CM

## 2013-01-30 DIAGNOSIS — I6529 Occlusion and stenosis of unspecified carotid artery: Secondary | ICD-10-CM

## 2013-01-30 DIAGNOSIS — I739 Peripheral vascular disease, unspecified: Secondary | ICD-10-CM

## 2013-01-30 DIAGNOSIS — R569 Unspecified convulsions: Secondary | ICD-10-CM

## 2013-01-30 DIAGNOSIS — F438 Other reactions to severe stress: Secondary | ICD-10-CM

## 2013-01-30 DIAGNOSIS — E785 Hyperlipidemia, unspecified: Secondary | ICD-10-CM

## 2013-01-30 DIAGNOSIS — H8110 Benign paroxysmal vertigo, unspecified ear: Secondary | ICD-10-CM

## 2013-01-30 DIAGNOSIS — F4389 Other reactions to severe stress: Secondary | ICD-10-CM

## 2013-01-30 MED ORDER — MECLIZINE HCL 12.5 MG PO TABS: 12.5000 mg | ORAL_TABLET | Freq: Three times a day (TID) | ORAL | Status: DC | PRN

## 2013-01-30 MED ORDER — CITALOPRAM HYDROBROMIDE 20 MG PO TABS: 20.0000 mg | ORAL_TABLET | Freq: Every day | ORAL | Status: DC

## 2013-01-30 NOTE — Progress Notes (Signed)
Patient ID: [name], male   DOB: May 10, 1931, 79 y.o.   MRN: [id number] SUBJECTIVE: CC: Chief Complaint  Patient presents with  . Acute Visit    DIZZY WHEN STANDS     HPI: Wife has ALS. Has  Stress.she gets delusional at night. Walks around with a gun in the hand. He had to remove all the guns from the house. He is up 24 hours a day with his wife.  He isnt sleeping. He went to the ED yesterday, weak and dizzy due to stress. He was shaking. Received a nerve pill at the ED and was fine afterwards. Had not slept in days. Family refused to put wife in a Nursing facility and yet he the husband and a daughter - in -law are the only 2 giving 24/7 care.  Last 2 weeks: when he stands up he gets dizzy. Rooms spins. No headache. Exhausted.  No new neurologic  Deficits No palpitations No chest pain.   Past Medical History  Diagnosis Date  . Diabetes mellitus type 2   . GERD (gastroesophageal reflux disease)   . Hyperlipidemia     takes Niacin nightly  . DJD (degenerative joint disease)   . Urosepsis   . Hypertension     takes Metoprolol and Ramipril daily  . Dizziness   . Gout     hx of--65yrs ago  . Itching     on right leg  . Bruises easily     pt is on Plavix and ASA  . Shortness of breath     wakes pt up during night with occ shortness of breathe;states it happens about once a month  . Impaired hearing   . Urinary frequency   . UTI (lower urinary tract infection)     hx of--03/2010  . Anemia     takes Glyburide bid  . CAD (coronary artery disease) of artery bypass graft   . Peripheral vascular disease   . S/P carotid endarterectomy   . Pneumonia     "at least twice that I know of"  . Seizures 05/14/11    "first time was today"  . CVA (cerebral vascular accident) ~ 2009    right arm/leg occ hurts  . Carotid artery occlusion    Past Surgical History  Procedure Laterality Date  . Appendectomy  1968  . Colonoscopy    . Endarterectomy  04/09/2011    Procedure:  ENDARTERECTOMY CAROTID;  Surgeon: Chuck Hint, MD;  Location: [facility] OR;  Service: Vascular;  Laterality: Left;  Left Carotid endarterectomy With Dacron Patch Angioplasty  . Cardiac catheterization  1998    dr hochrin  . Endarterectomy  05/08/2011    Procedure: ENDARTERECTOMY CAROTID;  Surgeon: Chuck Hint, MD;  Location: [facility] OR;  Service: Vascular;  Laterality: Right;  with Heamashield Patch Angioplasty  . Cataract extraction w/ intraocular lens  implant, bilateral  2012  . Coronary artery bypass graft  1998    LIMA to LAD, SVG to diagonal, SVG to PDA, SVG to posterior lateral.   . Eye surgery  1953    right ; "piece of metal removed"  . Eye surgery  2012    Cataract- Bilateral  . Carotid endarterectomy     History   Social History  . Marital Status: Married    Spouse Name: N/A    Number of Children: N/A  . Years of Education: N/A   Occupational History  . retired     [employer]   Social  History Main Topics  . Smoking status: Former Smoker -- 1.00 packs/day for 20 years    Types: Cigarettes    Quit date: 03/02/1974  . Smokeless tobacco: Never Used  . Alcohol Use: No     Comment: quit in 1981  . Drug Use: No  . Sexual Activity: No   Other Topics Concern  . Not on file   Social History Narrative  . No narrative on file   Family History  Problem Relation Age of Onset  . Diabetes Sister   . Cancer Brother   . Diabetes Brother   . Anesthesia problems Neg Hx   . Hypotension Neg Hx   . Malignant hyperthermia Neg Hx   . Pseudochol deficiency Neg Hx   . Hearing loss Father    Current Outpatient Prescriptions on File Prior to Visit  Medication Sig Dispense Refill  . alendronate (FOSAMAX) 35 MG tablet Take 35 mg by mouth every 7 (seven) days. Take with a full glass of water on an empty stomach.      Marland Kitchen aspirin EC 81 MG EC tablet Take 1 tablet (81 mg total) by mouth 2 (two) times daily.      . Calcium Carb-Cholecalciferol (CALCIUM 600+D3) 600-800 MG-UNIT TABS  Take 1 tablet by mouth 2 (two) times daily.      . clopidogrel (PLAVIX) 75 MG tablet Take 75 mg by mouth daily with breakfast.      . fish oil-omega-3 fatty acids 1000 MG capsule Take 1 g by mouth every morning. Contains Omega 3, 6, and 9      . Flaxseed, Linseed, (FLAX SEED OIL PO) Take 1,300 mg by mouth every morning.       . glyBURIDE (DIABETA) 2.5 MG tablet Take 2.5 mg by mouth 2 (two) times daily.      Marland Kitchen LORazepam (ATIVAN) 2 MG tablet Take 1 mg by mouth as needed for anxiety.      . metoprolol (LOPRESSOR) 50 MG tablet Take 25 mg by mouth 2 (two) times daily.      . Multiple Vitamin (MULITIVITAMIN WITH MINERALS) TABS Take 1 tablet by mouth daily.      . niacin (NIASPAN) 1000 MG CR tablet Take 1 tablet (1,000 mg total) by mouth at bedtime.  90 tablet  3  . ramipril (ALTACE) 2.5 MG capsule Take 2.5 mg by mouth at bedtime.       . simvastatin (ZOCOR) 20 MG tablet Take 20 mg by mouth at bedtime.      . Tamsulosin HCl (FLOMAX) 0.4 MG CAPS Take 0.4 mg by mouth daily.       No current facility-administered medications on file prior to visit.   Allergies  Allergen Reactions  . Fexofenadine Hcl Other (See Comments)    Heart attack symptoms  . Flonase [Fluticasone Propionate] Other (See Comments)    Heart attack symptoms  . Nasal Spray Other (See Comments)    Heart attack symptoms   . Nasal Spray     CAN NOT TOLERATE ANY NASAL SPRAYS!!!!!  Causes heart attack symptoms   Immunization History  Administered Date(s) Administered  . Influenza,inj,Quad PF,36+ Mos 12/09/2012   Prior to Admission medications   Medication Sig Start Date End Date Taking? Authorizing Provider  alendronate (FOSAMAX) 35 MG tablet Take 35 mg by mouth every 7 (seven) days. Take with a full glass of water on an empty stomach.    Historical Provider, MD  aspirin EC 81 MG EC tablet Take 1 tablet (81 mg  total) by mouth 2 (two) times daily. 03/21/12   Rollene Rotunda, MD  Calcium Carb-Cholecalciferol (CALCIUM 600+D3) 600-800  MG-UNIT TABS Take 1 tablet by mouth 2 (two) times daily.    Historical Provider, MD  clopidogrel (PLAVIX) 75 MG tablet Take 75 mg by mouth daily with breakfast.    Historical Provider, MD  fish oil-omega-3 fatty acids 1000 MG capsule Take 1 g by mouth every morning. Contains Omega 3, 6, and 9    Historical Provider, MD  Flaxseed, Linseed, (FLAX SEED OIL PO) Take 1,300 mg by mouth every morning.     Historical Provider, MD  glyBURIDE (DIABETA) 2.5 MG tablet Take 2.5 mg by mouth 2 (two) times daily.    Historical Provider, MD  LORazepam (ATIVAN) 2 MG tablet Take 1 mg by mouth as needed for anxiety.    Historical Provider, MD  metoprolol (LOPRESSOR) 50 MG tablet Take 25 mg by mouth 2 (two) times daily.    Historical Provider, MD  Multiple Vitamin (MULITIVITAMIN WITH MINERALS) TABS Take 1 tablet by mouth daily.    Historical Provider, MD  niacin (NIASPAN) 1000 MG CR tablet Take 1 tablet (1,000 mg total) by mouth at bedtime. 03/21/12   Rollene Rotunda, MD  ramipril (ALTACE) 2.5 MG capsule Take 2.5 mg by mouth at bedtime.     Historical Provider, MD  simvastatin (ZOCOR) 20 MG tablet Take 20 mg by mouth at bedtime.    Historical Provider, MD  Tamsulosin HCl (FLOMAX) 0.4 MG CAPS Take 0.4 mg by mouth daily.    Historical Provider, MD     ROS: As above in the HPI. All other systems are stable or negative.  OBJECTIVE: APPEARANCE:  Patient in no acute distress.The patient appeared well nourished and normally developed. Acyanotic. Waist: VITAL SIGNS:BP 148/74  Pulse 82  Temp(Src) 98.6 F (37 C) (Oral)  Ht 5\' 11"  (1.803 m)  Wt 213 lb 6.4 oz (96.798 kg)  BMI 29.78 kg/m2 WM overweight. Tearfull.   SKIN: warm and  Dry without overt rashes, tattoos and scars  HEAD and Neck: without JVD, Head and scalp: normal Eyes:No scleral icterus. Fundi normal, eye movements normal. Ears: Auricle normal, canal normal, Tympanic membranes normal, insufflation normal. Nose: normal Throat: normal Neck & thyroid:  normal  CHEST & LUNGS: Chest wall: normal Lungs: Clear  CVS: Reveals the PMI to be normally located. Regular rhythm, First and Second Heart sounds are normal,  absence of murmurs, rubs or gallops. Peripheral vasculature: Radial pulses: normal Dorsal pedis pulses: normal Posterior pulses: normal  ABDOMEN:  Appearance: obese Benign, no organomegaly, no masses, no Abdominal Aortic enlargement. No Guarding , no rebound. No Bruits. Bowel sounds: normal  RECTAL: N/A GU: N/A  EXTREMETIES: nonedematous.  MUSCULOSKELETAL:  Spine: normal Joints: intact  NEUROLOGIC: oriented to time,place and person; nonfocal. Strength is normal Sensory is normal Reflexes are normal Cranial Nerves are normal. Dix-Hallpike is positive.   ASSESSMENT:  Adjustment reaction of adult life - Plan: citalopram (CELEXA) 20 MG tablet  Benign paroxysmal positional vertigo - Plan: meclizine (ANTIVERT) 12.5 MG tablet  Seizure  Peripheral vascular disease  Occlusion and stenosis of carotid artery without mention of cerebral infarction, unspecified laterality  Hypertension  Hyperlipidemia  Diabetes mellitus  CAD (coronary artery disease) of artery bypass graft  PLAN:  No orders of the defined types were placed in this encounter.   Meds ordered this encounter  Medications  . meclizine (ANTIVERT) 12.5 MG tablet    Sig: Take 1 tablet (12.5 mg total) by  mouth 3 (three) times daily as needed for dizziness.    Dispense:  30 tablet    Refill:  0  . citalopram (CELEXA) 20 MG tablet    Sig: Take 1 tablet (20 mg total) by mouth daily.    Dispense:  30 tablet    Refill:  3  suppportive therapy counselling Stress reduction. Handout as well in the AVS.  Patient to see his minister for supportive  Religious counselling.  There are no discontinued medications. Return in about 4 weeks (around 02/27/2013) for Recheck medical problems. and medication response or before if her needs to.  Rest and  balance to his life. He will need to go back and talk to the family that this level of care demands is not sustainable.   Ibtisam Benge P. Modesto Charon, M.D.

## 2013-01-30 NOTE — Patient Instructions (Signed)
Stress Stress-related medical problems are becoming increasingly common. The body has a built-in physical response to stressful situations. Faced with pressure, challenge or danger, we need to react quickly. Our bodies release hormones such as cortisol and adrenaline to help do this. These hormones are part of the "fight or flight" response and affect the metabolic rate, heart rate and blood pressure, resulting in a heightened, stressed state that prepares the body for optimum performance in dealing with a stressful situation. It is likely that early man required these mechanisms to stay alive, but usually modern stresses do not call for this, and the same hormones released in today's world can damage health and reduce coping ability. CAUSES  Pressure to perform at work, at school or in sports.  Threats of physical violence.  Money worries.  Arguments.  Family conflicts.  Divorce or separation from significant other.  Bereavement.  New job or unemployment.  Changes in location.  Alcohol or drug abuse. SOMETIMES, THERE IS NO PARTICULAR REASON FOR DEVELOPING STRESS. Almost all people are at risk of being stressed at some time in their lives. It is important to know that some stress is temporary and some is long term.  Temporary stress will go away when a situation is resolved. Most people can cope with short periods of stress, and it can often be relieved by relaxing, taking a walk, chatting through issues with friends, or having a good night's sleep.  Chronic (long-term, continuous) stress is much harder to deal with. It can be psychologically and emotionally damaging. It can be harmful both for an individual and for friends and family. SYMPTOMS Everyone reacts to stress differently. There are some common effects that help us recognize it. In times of extreme stress, people may:  Shake uncontrollably.  Breathe faster and deeper than normal (hyperventilate).  Vomit.  For people  with asthma, stress can trigger an attack.  For some people, stress may trigger migraine headaches, ulcers, and body pain. PHYSICAL EFFECTS OF STRESS MAY INCLUDE:  Loss of energy.  Skin problems.  Aches and pains resulting from tense muscles, including neck ache, backache and tension headaches.  Increased pain from arthritis and other conditions.  Irregular heart beat (palpitations).  Periods of irritability or anger.  Apathy or depression.  Anxiety (feeling uptight or worrying).  Unusual behavior.  Loss of appetite.  Comfort eating.  Lack of concentration.  Loss of, or decreased, sex-drive.  Increased smoking, drinking, or recreational drug use.  For women, missed periods.  Ulcers, joint pain, and muscle pain. Post-traumatic stress is the stress caused by any serious accident, strong emotional damage, or extremely difficult or violent experience such as rape or war. Post-traumatic stress victims can experience mixtures of emotions such as fear, shame, depression, guilt or anger. It may include recurrent memories or images that may be haunting. These feelings can last for weeks, months or even years after the traumatic event that triggered them. Specialized treatment, possibly with medicines and psychological therapies, is available. If stress is causing physical symptoms, severe distress or making it difficult for you to function as normal, it is worth seeing your caregiver. It is important to remember that although stress is a usual part of life, extreme or prolonged stress can lead to other illnesses that will need treatment. It is better to visit a doctor sooner rather than later. Stress has been linked to the development of high blood pressure and heart disease, as well as insomnia and depression. There is no diagnostic test for   stress since everyone reacts to it differently. But a caregiver will be able to spot the physical symptoms, such  as:  Headaches.  Shingles.  Ulcers. Emotional distress such as intense worry, low mood or irritability should be detected when the doctor asks pertinent questions to identify any underlying problems that might be the cause. In case there are physical reasons for the symptoms, the doctor may also want to do some tests to exclude certain conditions. If you feel that you are suffering from stress, try to identify the aspects of your life that are causing it. Sometimes you may not be able to change or avoid them, but even a small change can have a positive ripple effect. A simple lifestyle change can make all the difference. STRATEGIES THAT CAN HELP DEAL WITH STRESS:  Delegating or sharing responsibilities.  Avoiding confrontations.  Learning to be more assertive.  Regular exercise.  Avoid using alcohol or street drugs to cope.  Eating a healthy, balanced diet, rich in fruit and vegetables and proteins.  Finding humor or absurdity in stressful situations.  Never taking on more than you know you can handle comfortably.  Organizing your time better to get as much done as possible.  Talking to friends or family and sharing your thoughts and fears.  Listening to music or relaxation tapes.  Tensing and then relaxing your muscles, starting at the toes and working up to the head and neck. If you think that you would benefit from help, either in identifying the things that are causing your stress or in learning techniques to help you relax, see a caregiver who is capable of helping you with this. Rather than relying on medications, it is usually better to try and identify the things in your life that are causing stress and try to deal with them. There are many techniques of managing stress including counseling, psychotherapy, aromatherapy, yoga, and exercise. Your caregiver can help you determine what is best for you. Document Released: 05/09/2002 Document Revised: 05/11/2011 Document  Reviewed: 04/05/2007 [employer] Patient Information 2014 [employer].  

## 2013-03-01 ENCOUNTER — Ambulatory Visit (INDEPENDENT_AMBULATORY_CARE_PROVIDER_SITE_OTHER): Payer: Medicare Other | Admitting: Family Medicine

## 2013-03-01 ENCOUNTER — Encounter: Payer: Self-pay | Admitting: Family Medicine

## 2013-03-01 VITALS — Ht 72.0 in

## 2013-03-01 DIAGNOSIS — F4320 Adjustment disorder, unspecified: Secondary | ICD-10-CM

## 2013-03-01 DIAGNOSIS — E785 Hyperlipidemia, unspecified: Secondary | ICD-10-CM

## 2013-03-01 DIAGNOSIS — F4389 Other reactions to severe stress: Secondary | ICD-10-CM

## 2013-03-01 DIAGNOSIS — R569 Unspecified convulsions: Secondary | ICD-10-CM

## 2013-03-01 DIAGNOSIS — M810 Age-related osteoporosis without current pathological fracture: Secondary | ICD-10-CM

## 2013-03-01 DIAGNOSIS — H8110 Benign paroxysmal vertigo, unspecified ear: Secondary | ICD-10-CM

## 2013-03-01 DIAGNOSIS — F438 Other reactions to severe stress: Secondary | ICD-10-CM

## 2013-03-01 DIAGNOSIS — I10 Essential (primary) hypertension: Secondary | ICD-10-CM

## 2013-03-01 DIAGNOSIS — I25810 Atherosclerosis of coronary artery bypass graft(s) without angina pectoris: Secondary | ICD-10-CM

## 2013-03-01 DIAGNOSIS — I739 Peripheral vascular disease, unspecified: Secondary | ICD-10-CM

## 2013-03-01 DIAGNOSIS — E119 Type 2 diabetes mellitus without complications: Secondary | ICD-10-CM

## 2013-03-01 MED ORDER — GLYBURIDE 2.5 MG PO TABS: 2.5000 mg | ORAL_TABLET | Freq: Two times a day (BID) | ORAL | Status: DC

## 2013-03-01 MED ORDER — ALENDRONATE SODIUM 35 MG PO TABS: 35.0000 mg | ORAL_TABLET | ORAL | Status: DC

## 2013-03-01 MED ORDER — CITALOPRAM HYDROBROMIDE 20 MG PO TABS: 20.0000 mg | ORAL_TABLET | Freq: Every day | ORAL | Status: DC

## 2013-03-01 MED ORDER — NIACIN ER (ANTIHYPERLIPIDEMIC) 1000 MG PO TBCR: 1000.0000 mg | EXTENDED_RELEASE_TABLET | Freq: Every day | ORAL | Status: DC

## 2013-03-01 MED ORDER — MECLIZINE HCL 25 MG PO TABS: 25.0000 mg | ORAL_TABLET | Freq: Three times a day (TID) | ORAL | Status: DC | PRN

## 2013-03-01 NOTE — Progress Notes (Signed)
Patient ID: [name], male   DOB: 04-Jun-1931, 79 y.o.   MRN: [id number] SUBJECTIVE: CC: Chief Complaint  Patient presents with  . Follow-up    1 month follow up   . Medication Refill    needs written rx only for 90 day nees fossamax, celexa,glyburide,    HPI: Came for follow up  Dizziness comes back after a while. Meclizine works well for it. Wants a refill. No neurologic deficits with the dizziness.turning head produces dizziness and the room looks like it is jerking. Wants refills on the meclizine for future relapses.  The citalopram worked great and he has family support in taking care of his wife.   Past Medical History  Diagnosis Date  . Diabetes mellitus type 2   . GERD (gastroesophageal reflux disease)   . Hyperlipidemia     takes Niacin nightly  . DJD (degenerative joint disease)   . Urosepsis   . Hypertension     takes Metoprolol and Ramipril daily  . Dizziness   . Gout     hx of--76yrs ago  . Itching     on right leg  . Bruises easily     pt is on Plavix and ASA  . Shortness of breath     wakes pt up during night with occ shortness of breathe;states it happens about once a month  . Impaired hearing   . Urinary frequency   . UTI (lower urinary tract infection)     hx of--03/2010  . Anemia     takes Glyburide bid  . CAD (coronary artery disease) of artery bypass graft   . Peripheral vascular disease   . S/P carotid endarterectomy   . Pneumonia     "at least twice that I know of"  . Seizures 05/14/11    "first time was today"  . CVA (cerebral vascular accident) ~ 2009    right arm/leg occ hurts  . Carotid artery occlusion    Past Surgical History  Procedure Laterality Date  . Appendectomy  1968  . Colonoscopy    . Endarterectomy  04/09/2011    Procedure: ENDARTERECTOMY CAROTID;  Surgeon: Chuck Hint, MD;  Location: [facility] OR;  Service: Vascular;  Laterality: Left;  Left Carotid endarterectomy With Dacron Patch Angioplasty  . Cardiac  catheterization  1998    dr hochrin  . Endarterectomy  05/08/2011    Procedure: ENDARTERECTOMY CAROTID;  Surgeon: Chuck Hint, MD;  Location: [facility] OR;  Service: Vascular;  Laterality: Right;  with [place] Patch Angioplasty  . Cataract extraction w/ intraocular lens  implant, bilateral  2012  . Coronary artery bypass graft  1998    LIMA to LAD, SVG to diagonal, SVG to PDA, SVG to posterior lateral.   . Eye surgery  1953    right ; "piece of metal removed"  . Eye surgery  2012    Cataract- Bilateral  . Carotid endarterectomy     History   Social History  . Marital Status: Married    Spouse Name: N/A    Number of Children: N/A  . Years of Education: N/A   Occupational History  . retired     [employer]   Social History Main Topics  . Smoking status: Former Smoker -- 1.00 packs/day for 20 years    Types: Cigarettes    Quit date: 03/02/1974  . Smokeless tobacco: Never Used  . Alcohol Use: No     Comment: quit in 1981  . Drug Use: No  .  Sexual Activity: No   Other Topics Concern  . Not on file   Social History Narrative  . No narrative on file   Family History  Problem Relation Age of Onset  . Diabetes Sister   . Cancer Brother   . Diabetes Brother   . Anesthesia problems Neg Hx   . Hypotension Neg Hx   . Malignant hyperthermia Neg Hx   . Pseudochol deficiency Neg Hx   . Hearing loss Father    Current Outpatient Prescriptions on File Prior to Visit  Medication Sig Dispense Refill  . aspirin EC 81 MG EC tablet Take 1 tablet (81 mg total) by mouth 2 (two) times daily.      . Calcium Carb-Cholecalciferol (CALCIUM 600+D3) 600-800 MG-UNIT TABS Take 1 tablet by mouth 2 (two) times daily.      . clopidogrel (PLAVIX) 75 MG tablet Take 75 mg by mouth daily with breakfast.      . fish oil-omega-3 fatty acids 1000 MG capsule Take 1 g by mouth every morning. Contains Omega 3, 6, and 9      . Flaxseed, Linseed, (FLAX SEED OIL PO) Take 1,300 mg by mouth every morning.        Marland Kitchen LORazepam (ATIVAN) 2 MG tablet Take 1 mg by mouth as needed for anxiety.      . metoprolol (LOPRESSOR) 50 MG tablet Take 25 mg by mouth 2 (two) times daily.      . Multiple Vitamin (MULITIVITAMIN WITH MINERALS) TABS Take 1 tablet by mouth daily.      . ramipril (ALTACE) 2.5 MG capsule Take 2.5 mg by mouth at bedtime.       . simvastatin (ZOCOR) 20 MG tablet Take 20 mg by mouth at bedtime.      . Tamsulosin HCl (FLOMAX) 0.4 MG CAPS Take 0.4 mg by mouth daily.       No current facility-administered medications on file prior to visit.   Allergies  Allergen Reactions  . Fexofenadine Hcl Other (See Comments)    Heart attack symptoms  . Flonase [Fluticasone Propionate] Other (See Comments)    Heart attack symptoms  . Nasal Spray Other (See Comments)    Heart attack symptoms   . Nasal Spray     CAN NOT TOLERATE ANY NASAL SPRAYS!!!!!  Causes heart attack symptoms   Immunization History  Administered Date(s) Administered  . Influenza,inj,Quad PF,36+ Mos 12/09/2012   Prior to Admission medications   Medication Sig Start Date End Date Taking? Authorizing Provider  alendronate (FOSAMAX) 35 MG tablet Take 35 mg by mouth every 7 (seven) days. Take with a full glass of water on an empty stomach.    Historical Provider, MD  aspirin EC 81 MG EC tablet Take 1 tablet (81 mg total) by mouth 2 (two) times daily. 03/21/12   Rollene Rotunda, MD  Calcium Carb-Cholecalciferol (CALCIUM 600+D3) 600-800 MG-UNIT TABS Take 1 tablet by mouth 2 (two) times daily.    Historical Provider, MD  citalopram (CELEXA) 20 MG tablet Take 1 tablet (20 mg total) by mouth daily. 01/30/13   Ileana Ladd, MD  clopidogrel (PLAVIX) 75 MG tablet Take 75 mg by mouth daily with breakfast.    Historical Provider, MD  fish oil-omega-3 fatty acids 1000 MG capsule Take 1 g by mouth every morning. Contains Omega 3, 6, and 9    Historical Provider, MD  Flaxseed, Linseed, (FLAX SEED OIL PO) Take 1,300 mg by mouth every morning.      Historical Provider,  MD  glyBURIDE (DIABETA) 2.5 MG tablet Take 2.5 mg by mouth 2 (two) times daily.    Historical Provider, MD  LORazepam (ATIVAN) 2 MG tablet Take 1 mg by mouth as needed for anxiety.    Historical Provider, MD  meclizine (ANTIVERT) 12.5 MG tablet Take 1 tablet (12.5 mg total) by mouth 3 (three) times daily as needed for dizziness. 01/30/13   Ileana Ladd, MD  metoprolol (LOPRESSOR) 50 MG tablet Take 25 mg by mouth 2 (two) times daily.    Historical Provider, MD  Multiple Vitamin (MULITIVITAMIN WITH MINERALS) TABS Take 1 tablet by mouth daily.    Historical Provider, MD  niacin (NIASPAN) 1000 MG CR tablet Take 1 tablet (1,000 mg total) by mouth at bedtime. 03/21/12   Rollene Rotunda, MD  ramipril (ALTACE) 2.5 MG capsule Take 2.5 mg by mouth at bedtime.     Historical Provider, MD  simvastatin (ZOCOR) 20 MG tablet Take 20 mg by mouth at bedtime.    Historical Provider, MD  Tamsulosin HCl (FLOMAX) 0.4 MG CAPS Take 0.4 mg by mouth daily.    Historical Provider, MD     ROS: As above in the HPI. All other systems are stable or negative.  OBJECTIVE: APPEARANCE:  Patient in no acute distress.The patient appeared well nourished and normally developed. Acyanotic. Waist: VITAL SIGNS:Ht 6' (1.829 m) WM looks calmer and less stressed   SKIN: warm and  Dry without overt rashes, tattoos and scars  HEAD and Neck: without JVD, Head and scalp: normal Eyes:No scleral icterus. Fundi normal, eye movements normal. Ears: Auricle normal, canal normal, Tympanic membranes normal, insufflation normal. Nose: normal Throat: normal Neck & thyroid: normal  CHEST & LUNGS: Chest wall: normal Lungs: Clear  CVS: Reveals the PMI to be normally located. Regular rhythm, First and Second Heart sounds are normal,  absence of murmurs, rubs or gallops. Peripheral vasculature: Radial pulses: normal Dorsal pedis pulses: normal Posterior pulses: normal  ABDOMEN:  Appearance: normal Benign, no  organomegaly, no masses, no Abdominal Aortic enlargement. No Guarding , no rebound. No Bruits. Bowel sounds: normal  RECTAL: N/A GU: N/A  EXTREMETIES: nonedematous.  MUSCULOSKELETAL:  Spine: normal Joints: intact  NEUROLOGIC: oriented to time,place and person; nonfocal. Strength is normal Sensory is normal Dix-Hallpike was positive on rotation of the head to the right and left lateral gaze.   Psychiatry: calmer and intact.less stressed and less anxious  Results for orders placed during the hospital encounter of 01/29/13  CBC WITH DIFFERENTIAL      Result Value Range   WBC 7.5  4.0 - 10.5 K/uL   RBC 4.20 (*) 4.22 - 5.81 MIL/uL   Hemoglobin 13.1  13.0 - 17.0 g/dL   HCT 69.6  29.5 - 28.4 %   MCV 93.6  78.0 - 100.0 fL   MCH 31.2  26.0 - 34.0 pg   MCHC 33.3  30.0 - 36.0 g/dL   RDW 13.2  44.0 - 10.2 %   Platelets 195  150 - 400 K/uL   Neutrophils Relative % 66  43 - 77 %   Neutro Abs 5.0  1.7 - 7.7 K/uL   Lymphocytes Relative 24  12 - 46 %   Lymphs Abs 1.8  0.7 - 4.0 K/uL   Monocytes Relative 6  3 - 12 %   Monocytes Absolute 0.5  0.1 - 1.0 K/uL   Eosinophils Relative 3  0 - 5 %   Eosinophils Absolute 0.2  0.0 - 0.7 K/uL  Basophils Relative 1  0 - 1 %   Basophils Absolute 0.1  0.0 - 0.1 K/uL  COMPREHENSIVE METABOLIC PANEL      Result Value Range   Sodium 138  135 - 145 mEq/L   Potassium 3.9  3.5 - 5.1 mEq/L   Chloride 99  96 - 112 mEq/L   CO2 31  19 - 32 mEq/L   Glucose, Bld 202 (*) 70 - 99 mg/dL   BUN 20  6 - 23 mg/dL   Creatinine, Ser 0.98  0.50 - 1.35 mg/dL   Calcium 9.4  8.4 - 11.9 mg/dL   Total Protein 7.3  6.0 - 8.3 g/dL   Albumin 3.6  3.5 - 5.2 g/dL   AST 16  0 - 37 U/L   ALT 16  0 - 53 U/L   Alkaline Phosphatase 47  39 - 117 U/L   Total Bilirubin 0.2 (*) 0.3 - 1.2 mg/dL   GFR calc non Af Amer 54 (*) >90 mL/min   GFR calc Af Amer 62 (*) >90 mL/min  PRO B NATRIURETIC PEPTIDE      Result Value Range   Pro B Natriuretic peptide (BNP) 95.1  0 - 450 pg/mL   TROPONIN I      Result Value Range   Troponin I <0.30  <0.30 ng/mL  URINALYSIS, ROUTINE W REFLEX MICROSCOPIC      Result Value Range   Color, Urine YELLOW  YELLOW   APPearance CLEAR  CLEAR   Specific Gravity, Urine 1.025  1.005 - 1.030   pH 6.0  5.0 - 8.0   Glucose, UA 500 (*) NEGATIVE mg/dL   Hgb urine dipstick TRACE (*) NEGATIVE   Bilirubin Urine NEGATIVE  NEGATIVE   Ketones, ur NEGATIVE  NEGATIVE mg/dL   Protein, ur NEGATIVE  NEGATIVE mg/dL   Urobilinogen, UA 0.2  0.0 - 1.0 mg/dL   Nitrite NEGATIVE  NEGATIVE   Leukocytes, UA NEGATIVE  NEGATIVE  URINE MICROSCOPIC-ADD ON      Result Value Range   Squamous Epithelial / LPF RARE  RARE   RBC / HPF 0-2  <3 RBC/hpf    ASSESSMENT:  Doing better on Celexa Adjustment reaction of adult life - Plan: citalopram (CELEXA) 20 MG tablet  CAD (coronary artery disease) of artery bypass graft  Diabetes mellitus - Plan: glyBURIDE (DIABETA) 2.5 MG tablet  Hyperlipidemia - Plan: niacin (NIASPAN) 1000 MG CR tablet  Hypertension  Peripheral vascular disease  Seizure  Benign paroxysmal positional vertigo - Plan: meclizine (ANTIVERT) 25 MG tablet  Osteoporosis, unspecified - Plan: alendronate (FOSAMAX) 35 MG tablet  PLAN:  No orders of the defined types were placed in this encounter.   Meds ordered this encounter  Medications  . citalopram (CELEXA) 20 MG tablet    Sig: Take 1 tablet (20 mg total) by mouth daily.    Dispense:  90 tablet    Refill:  3  . alendronate (FOSAMAX) 35 MG tablet    Sig: Take 1 tablet (35 mg total) by mouth every 7 (seven) days. Take with a full glass of water on an empty stomach.    Dispense:  13 tablet    Refill:  3  . glyBURIDE (DIABETA) 2.5 MG tablet    Sig: Take 1 tablet (2.5 mg total) by mouth 2 (two) times daily.    Dispense:  180 tablet    Refill:  3  . niacin (NIASPAN) 1000 MG CR tablet    Sig: Take 1 tablet (1,000 mg  total) by mouth at bedtime.    Dispense:  90 tablet    Refill:  3  .  meclizine (ANTIVERT) 25 MG tablet    Sig: Take 1 tablet (25 mg total) by mouth 3 (three) times daily as needed for dizziness.    Dispense:  30 tablet    Refill:  2   Medications Discontinued During This Encounter  Medication Reason  . citalopram (CELEXA) 20 MG tablet Reorder  . alendronate (FOSAMAX) 35 MG tablet Reorder  . glyBURIDE (DIABETA) 2.5 MG tablet Reorder  . niacin (NIASPAN) 1000 MG CR tablet Reorder  . meclizine (ANTIVERT) 12.5 MG tablet Reorder   Return in about 2 months (around 04/29/2013) for Recheck medical problems.  Shoua Ulloa P. Modesto Charon, M.D.

## 2013-03-03 ENCOUNTER — Other Ambulatory Visit: Payer: Self-pay | Admitting: Cardiology

## 2013-03-03 ENCOUNTER — Encounter: Payer: Self-pay | Admitting: Internal Medicine

## 2013-03-08 ENCOUNTER — Ambulatory Visit (INDEPENDENT_AMBULATORY_CARE_PROVIDER_SITE_OTHER): Payer: Medicare Other | Admitting: Cardiology

## 2013-03-08 ENCOUNTER — Encounter: Payer: Self-pay | Admitting: Cardiology

## 2013-03-08 VITALS — BP 103/55 | HR 71 | Ht 72.0 in | Wt 217.0 lb

## 2013-03-08 DIAGNOSIS — I10 Essential (primary) hypertension: Secondary | ICD-10-CM

## 2013-03-08 DIAGNOSIS — I739 Peripheral vascular disease, unspecified: Secondary | ICD-10-CM

## 2013-03-08 DIAGNOSIS — I25810 Atherosclerosis of coronary artery bypass graft(s) without angina pectoris: Secondary | ICD-10-CM

## 2013-03-08 MED ORDER — CLOPIDOGREL BISULFATE 75 MG PO TABS: 75.0000 mg | ORAL_TABLET | Freq: Every day | ORAL | Status: DC

## 2013-03-08 MED ORDER — METOPROLOL TARTRATE 50 MG PO TABS
ORAL_TABLET | ORAL | Status: DC
Start: 2013-03-08 — End: 2013-10-17

## 2013-03-08 MED ORDER — RAMIPRIL 2.5 MG PO CAPS: ORAL_CAPSULE | ORAL | Status: DC

## 2013-03-08 MED ORDER — SIMVASTATIN 20 MG PO TABS: ORAL_TABLET | ORAL | Status: DC

## 2013-03-08 NOTE — Progress Notes (Signed)
HPI The patient presents for followup of his coronary disease. Since I last saw him he has been busy going to doctors appts with his wife who might have ALS.  With all of this walking he has no cardiovascular complaints.  The patient denies any new symptoms such as chest discomfort, neck or arm discomfort. There has been no new shortness of breath, PND or orthopnea. There have been no reported palpitations, presyncope or syncope.  Allergies  Allergen Reactions  . Fexofenadine Hcl Other (See Comments)    Heart attack symptoms  . Flonase [Fluticasone Propionate] Other (See Comments)    Heart attack symptoms  . Nasal Spray Other (See Comments)    Heart attack symptoms   . Nasal Spray     CAN NOT TOLERATE ANY NASAL SPRAYS!!!!!  Causes heart attack symptoms    Current Outpatient Prescriptions  Medication Sig Dispense Refill  . alendronate (FOSAMAX) 35 MG tablet Take 1 tablet (35 mg total) by mouth every 7 (seven) days. Take with a full glass of water on an empty stomach.  13 tablet  3  . aspirin EC 81 MG EC tablet Take 1 tablet (81 mg total) by mouth 2 (two) times daily.      . Calcium Carb-Cholecalciferol (CALCIUM 600+D3) 600-800 MG-UNIT TABS Take 1 tablet by mouth 2 (two) times daily.      . citalopram (CELEXA) 20 MG tablet Take 1 tablet (20 mg total) by mouth daily.  90 tablet  3  . clopidogrel (PLAVIX) 75 MG tablet Take 75 mg by mouth daily with breakfast.      . fish oil-omega-3 fatty acids 1000 MG capsule Take 1 g by mouth every morning. Contains Omega 3, 6, and 9      . Flaxseed, Linseed, (FLAX SEED OIL PO) Take 1,300 mg by mouth every morning.       . glyBURIDE (DIABETA) 2.5 MG tablet Take 1 tablet (2.5 mg total) by mouth 2 (two) times daily.  180 tablet  3  . meclizine (ANTIVERT) 25 MG tablet Take 1 tablet (25 mg total) by mouth 3 (three) times daily as needed for dizziness.  30 tablet  2  . metoprolol (LOPRESSOR) 50 MG tablet TAKE ONE-HALF TABLET (25 MG) TWICE A DAY  (APPOINTMENT WITH DOCTOR Cody Albus 03/2013)  90 tablet  0  . Multiple Vitamin (MULITIVITAMIN WITH MINERALS) TABS Take 1 tablet by mouth daily.      . niacin (NIASPAN) 1000 MG CR tablet Take 1 tablet (1,000 mg total) by mouth at bedtime.  90 tablet  3  . ramipril (ALTACE) 2.5 MG capsule TAKE 1 CAPSULE DAILY (APPOINTMENT WITH DOCTOR Brinden Kincheloe 03/2013)  90 capsule  0  . simvastatin (ZOCOR) 20 MG tablet TAKE 1 TABLET AT BEDTIME (APPOINTMENT WITH DOCTOR Adiya Selmer 03/2013)  90 tablet  0  . Tamsulosin HCl (FLOMAX) 0.4 MG CAPS Take 0.4 mg by mouth daily.       No current facility-administered medications for this visit.    Past Medical History  Diagnosis Date  . Diabetes mellitus type 2   . GERD (gastroesophageal reflux disease)   . Hyperlipidemia     takes Niacin nightly  . DJD (degenerative joint disease)   . Hypertension     takes Metoprolol and Ramipril daily  . Dizziness   . Gout     hx of--70yrs ago  . Bruises easily     pt is on Plavix and ASA  . Shortness of breath     wakes  pt up during night with occ shortness of breathe;states it happens about once a month  . Impaired hearing   . Urinary frequency   . UTI (lower urinary tract infection)     hx of--03/2010  . Anemia     takes Glyburide bid  . CAD (coronary artery disease) of artery bypass graft   . Peripheral vascular disease   . Pneumonia     "at least twice that I know of"  . Seizures 05/14/11    "first time was today"  . CVA (cerebral vascular accident) ~ 2009    right arm/leg occ hurts    Past Surgical History  Procedure Laterality Date  . Appendectomy  1968  . Colonoscopy    . Endarterectomy  04/09/2011    Procedure: ENDARTERECTOMY CAROTID;  Surgeon: Chuck Hinthristopher S Dickson, MD;  [facility];  Service: Vascular;  Laterality: Left;  Left Carotid endarterectomy With Dacron Patch Angioplasty  . Cardiac catheterization  1998    dr hochrin  . Endarterectomy  05/08/2011    Procedure: ENDARTERECTOMY CAROTID;  Surgeon:  Chuck Hinthristopher S Dickson, MD;  [facility];  Service: Vascular;  Laterality: Right;  with [place] Patch Angioplasty  . Cataract extraction w/ intraocular lens  implant, bilateral  2012  . Coronary artery bypass graft  1998    LIMA to LAD, SVG to diagonal, SVG to PDA, SVG to posterior lateral.   . Eye surgery  1953    right ; "piece of metal removed"  . Eye surgery  2012    Cataract- Bilateral  . Carotid endarterectomy      ROS:  As stated in the HPI and negative for all other systems.   PHYSICAL EXAM BP 103/55  Pulse 71  Ht 6' (1.829 m)  Wt 217 lb (98.431 kg)  BMI 29.42 kg/m2 GENERAL:  Well appearing HEENT:  Pupils equal round and reactive, fundi not visualized, oral mucosa unremarkable, well healed endarterectomy scars NECK:  No jugular venous distention, waveform within normal limits, carotid upstroke brisk and symmetric, bilatral bruits, no thyromegaly LYMPHATICS:  No cervical, inguinal adenopathy LUNGS:  Clear to auscultation bilaterally BACK:  No CVA tenderness CHEST:  Well healed sternotomy scar. HEART:  PMI not displaced or sustained,S1 and S2 within normal limits, no S3, no S4, no clicks, no rubs, no murmurs ABD:  Flat, positive bowel sounds normal in frequency in pitch, no bruits, no rebound, no guarding, no midline pulsatile mass, no hepatomegaly, no splenomegaly EXT:  2 plus pulses upper, absent DP/PT right and decreased on the left, no edema, no cyanosis no clubbing   ASSESSMENT AND PLAN  CAD (coronary artery disease) of artery bypass graft -  The patient is doing well. He had a stress test in 13. No further cardiovascular testing is suggested. He will continue with risk reduction.   Hypertension -  His blood pressure is well controlled. He will continue the meds as listed.  Hyperlipidemia -  This is followed by Redmond BasemanWONG,FRANCIS PATRICK, MD     HPI The patient presents for followup of his coronary disease. Since I last saw him he has had bilateral carotid  endarterectomies. He did have a seizure complicating this thought possibly to be embolic. However, he thinks has recovered completely from this. He thinks he is doing well from a cardiovascular standpoint. He walks daily.  The patient denies any new symptoms such as chest discomfort, neck or arm discomfort. There has been no new shortness of breath, PND or orthopnea. There have been  no reported palpitations, presyncope or syncope.  Allergies  Allergen Reactions  . Fexofenadine Hcl Other (See Comments)    Heart attack symptoms  . Flonase [Fluticasone Propionate] Other (See Comments)    Heart attack symptoms  . Nasal Spray Other (See Comments)    Heart attack symptoms   . Nasal Spray     CAN NOT TOLERATE ANY NASAL SPRAYS!!!!!  Causes heart attack symptoms    Current Outpatient Prescriptions  Medication Sig Dispense Refill  . alendronate (FOSAMAX) 35 MG tablet Take 1 tablet (35 mg total) by mouth every 7 (seven) days. Take with a full glass of water on an empty stomach.  13 tablet  3  . aspirin EC 81 MG EC tablet Take 1 tablet (81 mg total) by mouth 2 (two) times daily.      . Calcium Carb-Cholecalciferol (CALCIUM 600+D3) 600-800 MG-UNIT TABS Take 1 tablet by mouth 2 (two) times daily.      . citalopram (CELEXA) 20 MG tablet Take 1 tablet (20 mg total) by mouth daily.  90 tablet  3  . clopidogrel (PLAVIX) 75 MG tablet Take 1 tablet (75 mg total) by mouth daily with breakfast.  90 tablet  3  . fish oil-omega-3 fatty acids 1000 MG capsule Take 1 g by mouth every morning. Contains Omega 3, 6, and 9      . Flaxseed, Linseed, (FLAX SEED OIL PO) Take 1,300 mg by mouth every morning.       . glyBURIDE (DIABETA) 2.5 MG tablet Take 1 tablet (2.5 mg total) by mouth 2 (two) times daily.  180 tablet  3  . meclizine (ANTIVERT) 25 MG tablet Take 1 tablet (25 mg total) by mouth 3 (three) times daily as needed for dizziness.  30 tablet  2  . metoprolol (LOPRESSOR) 50 MG tablet TAKE ONE-HALF TABLET (25 MG)  TWICE A DAY (APPOINTMENT WITH DOCTOR Aldine Chakraborty 03/2013)  90 tablet  3  . Multiple Vitamin (MULITIVITAMIN WITH MINERALS) TABS Take 1 tablet by mouth daily.      . niacin (NIASPAN) 1000 MG CR tablet Take 1 tablet (1,000 mg total) by mouth at bedtime.  90 tablet  3  . ramipril (ALTACE) 2.5 MG capsule TAKE 1 CAPSULE DAILY (APPOINTMENT WITH DOCTOR Deyja Sochacki 03/2013)  90 capsule  3  . simvastatin (ZOCOR) 20 MG tablet TAKE 1 TABLET AT BEDTIME (APPOINTMENT WITH DOCTOR Cenia Zaragosa 03/2013)  90 tablet  3  . Tamsulosin HCl (FLOMAX) 0.4 MG CAPS Take 0.4 mg by mouth daily.       No current facility-administered medications for this visit.    Past Medical History  Diagnosis Date  . Diabetes mellitus type 2   . GERD (gastroesophageal reflux disease)   . Hyperlipidemia     takes Niacin nightly  . DJD (degenerative joint disease)   . Hypertension     takes Metoprolol and Ramipril daily  . Dizziness   . Gout     hx of--66yrs ago  . Bruises easily     pt is on Plavix and ASA  . Shortness of breath     wakes pt up during night with occ shortness of breathe;states it happens about once a month  . Impaired hearing   . Urinary frequency   . UTI (lower urinary tract infection)     hx of--03/2010  . Anemia     takes Glyburide bid  . CAD (coronary artery disease) of artery bypass graft   . Peripheral vascular disease   . Pneumonia     "  at least twice that I know of"  . Seizures 05/14/11    "first time was today"  . CVA (cerebral vascular accident) ~ 2009    right arm/leg occ hurts    Past Surgical History  Procedure Laterality Date  . Appendectomy  1968  . Colonoscopy    . Endarterectomy  04/09/2011    Procedure: ENDARTERECTOMY CAROTID;  Surgeon: Chuck Hint, MD;  Location: [facility] OR;  Service: Vascular;  Laterality: Left;  Left Carotid endarterectomy With Dacron Patch Angioplasty  . Cardiac catheterization  1998    dr hochrin  . Endarterectomy  05/08/2011    Procedure: ENDARTERECTOMY CAROTID;   Surgeon: Chuck Hint, MD;  [facility];  Service: Vascular;  Laterality: Right;  with [place] Patch Angioplasty  . Cataract extraction w/ intraocular lens  implant, bilateral  2012  . Coronary artery bypass graft  1998    LIMA to LAD, SVG to diagonal, SVG to PDA, SVG to posterior lateral.   . Eye surgery  1953    right ; "piece of metal removed"  . Eye surgery  2012    Cataract- Bilateral  . Carotid endarterectomy      ROS:  As stated in the HPI and negative for all other systems.   PHYSICAL EXAM BP 103/55  Pulse 71  Ht 6' (1.829 m)  Wt 217 lb (98.431 kg)  BMI 29.42 kg/m2 GENERAL:  Well appearing HEENT:  Pupils equal round and reactive, fundi not visualized, oral mucosa unremarkable, well healed endarterectomy scars NECK:  No jugular venous distention, waveform within normal limits, carotid upstroke brisk and symmetric, bilatral bruits, no thyromegaly LYMPHATICS:  No cervical, inguinal adenopathy LUNGS:  Clear to auscultation bilaterally BACK:  No CVA tenderness CHEST:  Well healed sternotomy scar. HEART:  PMI not displaced or sustained,S1 and S2 within normal limits, no S3, no S4, no clicks, no rubs, no murmurs ABD:  Flat, positive bowel sounds normal in frequency in pitch, no bruits, no rebound, no guarding, no midline pulsatile mass, no hepatomegaly, no splenomegaly EXT:  2 plus pulses upper, absent DP/PT right and decreased on the left, no edema, no cyanosis no clubbing   ASSESSMENT AND PLAN  CAD (coronary artery disease) of artery bypass graft -  The patient is doing well. He had a stress test last year. No further cardiovascular testing is suggested. He will continue with risk reduction. He can reduce his aspirin back to 81 mg daily.   Hypertension -  His blood pressure is well controlled. He will continue the meds as listed.  Hyperlipidemia -  I will defer to his primary provider with a goal LDL less than 70 and HDL greater than 50.

## 2013-03-08 NOTE — Patient Instructions (Signed)
The current medical regimen is effective;  continue present plan and medications.  Follow up in 1 year with Dr Hochrein.  You will receive a letter in the mail 2 months before you are due.  Please call us when you receive this letter to schedule your follow up appointment.  

## 2013-03-13 ENCOUNTER — Ambulatory Visit: Payer: Medicare Other | Admitting: Gastroenterology

## 2013-03-16 ENCOUNTER — Other Ambulatory Visit: Payer: Self-pay | Admitting: Family Medicine

## 2013-03-16 ENCOUNTER — Other Ambulatory Visit: Payer: Self-pay | Admitting: Cardiology

## 2013-03-18 ENCOUNTER — Other Ambulatory Visit: Payer: Self-pay | Admitting: Family Medicine

## 2013-03-20 ENCOUNTER — Other Ambulatory Visit: Payer: Self-pay | Admitting: *Deleted

## 2013-03-20 DIAGNOSIS — F4320 Adjustment disorder, unspecified: Secondary | ICD-10-CM

## 2013-03-20 MED ORDER — CITALOPRAM HYDROBROMIDE 20 MG PO TABS: 20.0000 mg | ORAL_TABLET | Freq: Every day | ORAL | Status: DC

## 2013-03-30 ENCOUNTER — Other Ambulatory Visit: Payer: Self-pay | Admitting: Vascular Surgery

## 2013-03-30 DIAGNOSIS — I6529 Occlusion and stenosis of unspecified carotid artery: Secondary | ICD-10-CM

## 2013-03-30 DIAGNOSIS — Z48812 Encounter for surgical aftercare following surgery on the circulatory system: Secondary | ICD-10-CM

## 2013-04-05 ENCOUNTER — Ambulatory Visit: Payer: Medicare Other | Admitting: Cardiology

## 2013-04-11 ENCOUNTER — Encounter: Payer: Self-pay | Admitting: Family Medicine

## 2013-04-11 ENCOUNTER — Ambulatory Visit: Payer: Medicare Other | Admitting: Family Medicine

## 2013-04-11 ENCOUNTER — Ambulatory Visit (INDEPENDENT_AMBULATORY_CARE_PROVIDER_SITE_OTHER): Payer: Medicare Other | Admitting: Family Medicine

## 2013-04-11 VITALS — BP 133/68 | HR 80 | Temp 97.8°F | Ht 71.0 in | Wt 216.6 lb

## 2013-04-11 DIAGNOSIS — R569 Unspecified convulsions: Secondary | ICD-10-CM

## 2013-04-11 DIAGNOSIS — I10 Essential (primary) hypertension: Secondary | ICD-10-CM

## 2013-04-11 DIAGNOSIS — E119 Type 2 diabetes mellitus without complications: Secondary | ICD-10-CM

## 2013-04-11 DIAGNOSIS — I25810 Atherosclerosis of coronary artery bypass graft(s) without angina pectoris: Secondary | ICD-10-CM

## 2013-04-11 DIAGNOSIS — E785 Hyperlipidemia, unspecified: Secondary | ICD-10-CM

## 2013-04-11 DIAGNOSIS — I6529 Occlusion and stenosis of unspecified carotid artery: Secondary | ICD-10-CM

## 2013-04-11 DIAGNOSIS — I739 Peripheral vascular disease, unspecified: Secondary | ICD-10-CM

## 2013-04-11 LAB — POCT GLYCOSYLATED HEMOGLOBIN (HGB A1C): Hemoglobin A1C: 6.7

## 2013-04-11 NOTE — Progress Notes (Signed)
Patient ID: [name] JarvisSylvester H Hitson, male   DOB: Oct 07, 1931, 80 y.o.   MRN: [id number] SUBJECTIVE: CC: Chief Complaint  Patient presents with  . Follow-up    4 month follow up      HPI: Patient is here for follow up of Diabetes Mellitus/HTN/HLD/CAD: Symptoms evaluated: Denies Nocturia ,Denies Urinary Frequency , denies Blurred vision ,deniesDizziness,denies.Dysuria,denies paresthesias, denies extremity pain or ulcers.Marland Kitchen.denies chest pain. has had an annual eye exam. do check the feet. Does check CBGs. Average CBG: Denies episodes of hypoglycemia. Does have an emergency hypoglycemic plan. admits toCompliance with medications. Denies Problems with medications. Less stress since wife accepted the Dx of ALS.   Past Medical History  Diagnosis Date  . Diabetes mellitus type 2   . GERD (gastroesophageal reflux disease)   . Hyperlipidemia     takes Niacin nightly  . DJD (degenerative joint disease)   . Hypertension     takes Metoprolol and Ramipril daily  . Dizziness   . Gout     hx of--3834yrs ago  . Bruises easily     pt is on Plavix and ASA  . Shortness of breath     wakes pt up during night with occ shortness of breathe;states it happens about once a month  . Impaired hearing   . Urinary frequency   . UTI (lower urinary tract infection)     hx of--03/2010  . Anemia     takes Glyburide bid  . CAD (coronary artery disease) of artery bypass graft   . Peripheral vascular disease   . Pneumonia     "at least twice that I know of"  . Seizures 05/14/11    "first time was today"  . CVA (cerebral vascular accident) ~ 2009    right arm/leg occ hurts   Past Surgical History  Procedure Laterality Date  . Appendectomy  1968  . Colonoscopy    . Endarterectomy  04/09/2011    Procedure: ENDARTERECTOMY CAROTID;  Surgeon: Chuck Hinthristopher S Dickson, MD;  [facility];  Service: Vascular;  Laterality: Left;  Left Carotid endarterectomy With Dacron Patch Angioplasty  . Cardiac catheterization   1998    dr hochrin  . Endarterectomy  05/08/2011    Procedure: ENDARTERECTOMY CAROTID;  Surgeon: Chuck Hinthristopher S Dickson, MD;  [facility];  Service: Vascular;  Laterality: Right;  with [place] Patch Angioplasty  . Cataract extraction w/ intraocular lens  implant, bilateral  2012  . Coronary artery bypass graft  1998    LIMA to LAD, SVG to diagonal, SVG to PDA, SVG to posterior lateral.   . Eye surgery  1953    right ; "piece of metal removed"  . Eye surgery  2012    Cataract- Bilateral  . Carotid endarterectomy     History   Social History  . Marital Status: Married    Spouse Name: N/A    Number of Children: N/A  . Years of Education: N/A   Occupational History  . retired     [employer]   Social History Main Topics  . Smoking status: Former Smoker -- 1.00 packs/day for 20 years    Types: Cigarettes    Quit date: 03/02/1974  . Smokeless tobacco: Never Used  . Alcohol Use: No     Comment: quit in 1981  . Drug Use: No  . Sexual Activity: No   Other Topics Concern  . Not on file   Social History Narrative  . No narrative on file   Family History  Problem Relation Age of Onset  . Diabetes Sister   . Cancer Brother   . Diabetes Brother   . Anesthesia problems Neg Hx   . Hypotension Neg Hx   . Malignant hyperthermia Neg Hx   . Pseudochol deficiency Neg Hx   . Hearing loss Father    Current Outpatient Prescriptions on File Prior to Visit  Medication Sig Dispense Refill  . ACCU-CHEK AVIVA PLUS test strip TEST TWICE A DAY  100 each  5  . alendronate (FOSAMAX) 35 MG tablet Take 1 tablet (35 mg total) by mouth every 7 (seven) days. Take with a full glass of water on an empty stomach.  13 tablet  3  . aspirin EC 81 MG EC tablet Take 1 tablet (81 mg total) by mouth 2 (two) times daily.      . Calcium Carb-Cholecalciferol (CALCIUM 600+D3) 600-800 MG-UNIT TABS Take 1 tablet by mouth 2 (two) times daily.      . citalopram (CELEXA) 20 MG tablet Take 1 tablet (20 mg total) by  mouth daily.  90 tablet  0  . clopidogrel (PLAVIX) 75 MG tablet Take 1 tablet (75 mg total) by mouth daily with breakfast.  90 tablet  3  . fish oil-omega-3 fatty acids 1000 MG capsule Take 1 g by mouth every morning. Contains Omega 3, 6, and 9      . Flaxseed, Linseed, (FLAX SEED OIL PO) Take 1,300 mg by mouth every morning.       . glyBURIDE (DIABETA) 2.5 MG tablet Take 1 tablet (2.5 mg total) by mouth 2 (two) times daily.  180 tablet  3  . meclizine (ANTIVERT) 25 MG tablet Take 1 tablet (25 mg total) by mouth 3 (three) times daily as needed for dizziness.  30 tablet  2  . metoprolol (LOPRESSOR) 50 MG tablet TAKE ONE-HALF TABLET (25 MG) TWICE A DAY (APPOINTMENT WITH DOCTOR HOCHREIN 03/2013)  90 tablet  3  . Multiple Vitamin (MULITIVITAMIN WITH MINERALS) TABS Take 1 tablet by mouth daily.      . niacin (NIASPAN) 1000 MG CR tablet Take 1 tablet (1,000 mg total) by mouth at bedtime.  90 tablet  3  . ramipril (ALTACE) 2.5 MG capsule TAKE 1 CAPSULE DAILY (APPOINTMENT WITH DOCTOR HOCHREIN 03/2013)  90 capsule  3  . simvastatin (ZOCOR) 20 MG tablet TAKE 1 TABLET AT BEDTIME (APPOINTMENT WITH DOCTOR HOCHREIN 03/2013)  90 tablet  3  . Tamsulosin HCl (FLOMAX) 0.4 MG CAPS Take 0.4 mg by mouth daily.       No current facility-administered medications on file prior to visit.   Allergies  Allergen Reactions  . Fexofenadine Hcl Other (See Comments)    Heart attack symptoms  . Flonase [Fluticasone Propionate] Other (See Comments)    Heart attack symptoms  . Nasal Spray Other (See Comments)    Heart attack symptoms   . Nasal Spray     CAN NOT TOLERATE ANY NASAL SPRAYS!!!!!  Causes heart attack symptoms   Immunization History  Administered Date(s) Administered  . Influenza,inj,Quad PF,36+ Mos 12/09/2012   Prior to Admission medications   Medication Sig Start Date End Date Taking? Authorizing Provider  ACCU-CHEK AVIVA PLUS test strip TEST TWICE A DAY 03/16/13  Yes Ileana Ladd, MD  alendronate  (FOSAMAX) 35 MG tablet Take 1 tablet (35 mg total) by mouth every 7 (seven) days. Take with a full glass of water on an empty stomach. 03/01/13  Yes Ileana Ladd, MD  aspirin EC  81 MG EC tablet Take 1 tablet (81 mg total) by mouth 2 (two) times daily. 03/21/12  Yes Rollene Rotunda, MD  Calcium Carb-Cholecalciferol (CALCIUM 600+D3) 600-800 MG-UNIT TABS Take 1 tablet by mouth 2 (two) times daily.   Yes Historical Provider, MD  citalopram (CELEXA) 20 MG tablet Take 1 tablet (20 mg total) by mouth daily. 03/20/13  Yes Ileana Ladd, MD  clopidogrel (PLAVIX) 75 MG tablet Take 1 tablet (75 mg total) by mouth daily with breakfast. 03/08/13  Yes Rollene Rotunda, MD  fish oil-omega-3 fatty acids 1000 MG capsule Take 1 g by mouth every morning. Contains Omega 3, 6, and 9   Yes Historical Provider, MD  Flaxseed, Linseed, (FLAX SEED OIL PO) Take 1,300 mg by mouth every morning.    Yes Historical Provider, MD  glyBURIDE (DIABETA) 2.5 MG tablet Take 1 tablet (2.5 mg total) by mouth 2 (two) times daily. 03/01/13  Yes Ileana Ladd, MD  meclizine (ANTIVERT) 25 MG tablet Take 1 tablet (25 mg total) by mouth 3 (three) times daily as needed for dizziness. 03/01/13  Yes Ileana Ladd, MD  metoprolol (LOPRESSOR) 50 MG tablet TAKE ONE-HALF TABLET (25 MG) TWICE A DAY (APPOINTMENT WITH DOCTOR HOCHREIN 03/2013) 03/08/13  Yes Rollene Rotunda, MD  Multiple Vitamin (MULITIVITAMIN WITH MINERALS) TABS Take 1 tablet by mouth daily.   Yes Historical Provider, MD  niacin (NIASPAN) 1000 MG CR tablet Take 1 tablet (1,000 mg total) by mouth at bedtime. 03/01/13  Yes Ileana Ladd, MD  ramipril (ALTACE) 2.5 MG capsule TAKE 1 CAPSULE DAILY (APPOINTMENT WITH DOCTOR HOCHREIN 03/2013) 03/08/13  Yes Rollene Rotunda, MD  simvastatin (ZOCOR) 20 MG tablet TAKE 1 TABLET AT BEDTIME (APPOINTMENT WITH DOCTOR HOCHREIN 03/2013) 03/08/13  Yes Rollene Rotunda, MD  Tamsulosin HCl (FLOMAX) 0.4 MG CAPS Take 0.4 mg by mouth daily.   Yes Historical Provider, MD      ROS: As above in the HPI. All other systems are stable or negative.  OBJECTIVE: APPEARANCE:  Patient in no acute distress.The patient appeared well nourished and normally developed. Acyanotic. Waist: VITAL SIGNS:BP 133/68  Pulse 80  Temp(Src) 97.8 F (36.6 C) (Oral)  Ht 5\' 11"  (1.803 m)  Wt 216 lb 9.6 oz (98.249 kg)  BMI 30.22 kg/m2 WM obese  SKIN: warm and  Dry without overt rashes, tattoos and scars  HEAD and Neck: without JVD, Head and scalp: normal Eyes:No scleral icterus. Fundi normal, eye movements normal. Ears: Auricle normal, canal normal, Tympanic membranes normal, insufflation normal. Nose: normal Throat: normal Neck & thyroid: normal  CHEST & LUNGS: Chest wall: normal Lungs: Clear  CVS: Reveals the PMI to be normally located. Regular rhythm, First and Second Heart sounds are normal,  absence of murmurs, rubs or gallops. Peripheral vasculature: Radial pulses: normal Dorsal pedis pulses: normal Posterior pulses: normal  ABDOMEN:  Appearance: normal Benign, no organomegaly, no masses, no Abdominal Aortic enlargement. No Guarding , no rebound. No Bruits. Bowel sounds: normal  RECTAL: N/A GU: N/A  EXTREMETIES: nonedematous.  MUSCULOSKELETAL:  Spine: normal Joints: intact  NEUROLOGIC: oriented to time,place and person; nonfocal. Strength is normal Sensory is normal Reflexes are normal Cranial Nerves are normal. Results for orders placed during the hospital encounter of 01/29/13  CBC WITH DIFFERENTIAL      Result Value Range   WBC 7.5  4.0 - 10.5 K/uL   RBC 4.20 (*) 4.22 - 5.81 MIL/uL   Hemoglobin 13.1  13.0 - 17.0 g/dL   HCT 16.1  09.6 -  52.0 %   MCV 93.6  78.0 - 100.0 fL   MCH 31.2  26.0 - 34.0 pg   MCHC 33.3  30.0 - 36.0 g/dL   RDW 16.1  09.6 - 04.5 %   Platelets 195  150 - 400 K/uL   Neutrophils Relative % 66  43 - 77 %   Neutro Abs 5.0  1.7 - 7.7 K/uL   Lymphocytes Relative 24  12 - 46 %   Lymphs Abs 1.8  0.7 - 4.0 K/uL    Monocytes Relative 6  3 - 12 %   Monocytes Absolute 0.5  0.1 - 1.0 K/uL   Eosinophils Relative 3  0 - 5 %   Eosinophils Absolute 0.2  0.0 - 0.7 K/uL   Basophils Relative 1  0 - 1 %   Basophils Absolute 0.1  0.0 - 0.1 K/uL  COMPREHENSIVE METABOLIC PANEL      Result Value Range   Sodium 138  135 - 145 mEq/L   Potassium 3.9  3.5 - 5.1 mEq/L   Chloride 99  96 - 112 mEq/L   CO2 31  19 - 32 mEq/L   Glucose, Bld 202 (*) 70 - 99 mg/dL   BUN 20  6 - 23 mg/dL   Creatinine, Ser 4.09  0.50 - 1.35 mg/dL   Calcium 9.4  8.4 - 81.1 mg/dL   Total Protein 7.3  6.0 - 8.3 g/dL   Albumin 3.6  3.5 - 5.2 g/dL   AST 16  0 - 37 U/L   ALT 16  0 - 53 U/L   Alkaline Phosphatase 47  39 - 117 U/L   Total Bilirubin 0.2 (*) 0.3 - 1.2 mg/dL   GFR calc non Af Amer 54 (*) >90 mL/min   GFR calc Af Amer 62 (*) >90 mL/min  PRO B NATRIURETIC PEPTIDE      Result Value Range   Pro B Natriuretic peptide (BNP) 95.1  0 - 450 pg/mL  TROPONIN I      Result Value Range   Troponin I <0.30  <0.30 ng/mL  URINALYSIS, ROUTINE W REFLEX MICROSCOPIC      Result Value Range   Color, Urine YELLOW  YELLOW   APPearance CLEAR  CLEAR   Specific Gravity, Urine 1.025  1.005 - 1.030   pH 6.0  5.0 - 8.0   Glucose, UA 500 (*) NEGATIVE mg/dL   Hgb urine dipstick TRACE (*) NEGATIVE   Bilirubin Urine NEGATIVE  NEGATIVE   Ketones, ur NEGATIVE  NEGATIVE mg/dL   Protein, ur NEGATIVE  NEGATIVE mg/dL   Urobilinogen, UA 0.2  0.0 - 1.0 mg/dL   Nitrite NEGATIVE  NEGATIVE   Leukocytes, UA NEGATIVE  NEGATIVE  URINE MICROSCOPIC-ADD ON      Result Value Range   Squamous Epithelial / LPF RARE  RARE   RBC / HPF 0-2  <3 RBC/hpf    ASSESSMENT:  Seizure  Occlusion and stenosis of carotid artery without mention of cerebral infarction  Peripheral vascular disease  Hypertension  Hyperlipidemia  Diabetes mellitus  CAD (coronary artery disease) of artery bypass graft  PLAN:      Dr Woodroe Mode Recommendations  For nutrition  information, I recommend books:  1).Eat to Live by Dr Monico Hoar. 2).Prevent and Reverse Heart Disease by Dr Suzzette Righter. 3) Dr Katherina Right Book:  Program to Reverse Diabetes  Exercise recommendations are:  If unable to walk, then the patient can exercise in a chair 3 times a day.  By flapping arms like a bird gently and raising legs outwards to the front.  If ambulatory, the patient can go for walks for 30 minutes 3 times a week. Then increase the intensity and duration as tolerated.  Goal is to try to attain exercise frequency to 5 times a week.  If applicable: Best to perform resistance exercises (machines or weights) 2 days a week and cardio type exercises 3 days per week.  No orders of the defined types were placed in this encounter.   No orders of the defined types were placed in this encounter.   There are no discontinued medications. Return in about 4 months (around 08/09/2013) for Recheck medical problems.  Keirstan Iannello P. Modesto Charon, M.D.

## 2013-04-11 NOTE — Patient Instructions (Signed)
      Dr Judee Hennick's Recommendations  For nutrition information, I recommend books:  1).Eat to Live by Dr Joel Fuhrman. 2).Prevent and Reverse Heart Disease by Dr Caldwell Esselstyn. 3) Dr Neal Barnard's Book:  Program to Reverse Diabetes  Exercise recommendations are:  If unable to walk, then the patient can exercise in a chair 3 times a day. By flapping arms like a bird gently and raising legs outwards to the front.  If ambulatory, the patient can go for walks for 30 minutes 3 times a week. Then increase the intensity and duration as tolerated.  Goal is to try to attain exercise frequency to 5 times a week.  If applicable: Best to perform resistance exercises (machines or weights) 2 days a week and cardio type exercises 3 days per week.  

## 2013-04-13 LAB — NMR, LIPOPROFILE
Cholesterol: 162 mg/dL (ref <200)
HDL Cholesterol by NMR: 35 mg/dL — ABNORMAL LOW (ref >=40)
HDL Particle Number: 26.1 umol/L — ABNORMAL LOW (ref >=30.5)
LDL Particle Number: 1631 nmol/L — ABNORMAL HIGH (ref <1000)
LDL Size: 20.3 nm — ABNORMAL LOW (ref >20.5)
LDLC SERPL CALC-MCNC: 109 mg/dL — ABNORMAL HIGH (ref <100)
LP-IR Score: 67 — ABNORMAL HIGH (ref <=45)
Small LDL Particle Number: 942 nmol/L — ABNORMAL HIGH (ref <=527)
Triglycerides by NMR: 89 mg/dL (ref <150)

## 2013-04-13 LAB — HEPATIC FUNCTION PANEL
ALT: 25 IU/L (ref 0–44)
AST: 22 IU/L (ref 0–40)
Albumin: 4.4 g/dL (ref 3.5–4.8)
Alkaline Phosphatase: 46 IU/L (ref 39–117)
Bilirubin, Direct: 0.09 mg/dL (ref 0.00–0.40)
Total Bilirubin: <0.2 mg/dL (ref 0.0–1.2)
Total Protein: 7.3 g/dL (ref 6.0–8.5)

## 2013-05-15 ENCOUNTER — Encounter: Payer: Medicare Other | Admitting: Internal Medicine

## 2013-05-17 ENCOUNTER — Other Ambulatory Visit: Payer: Self-pay | Admitting: Cardiology

## 2013-05-30 ENCOUNTER — Encounter: Payer: Self-pay | Admitting: Vascular Surgery

## 2013-05-31 ENCOUNTER — Ambulatory Visit (INDEPENDENT_AMBULATORY_CARE_PROVIDER_SITE_OTHER): Payer: Medicare Other | Admitting: Vascular Surgery

## 2013-05-31 ENCOUNTER — Ambulatory Visit (HOSPITAL_COMMUNITY)
Admission: RE | Admit: 2013-05-31 | Discharge: 2013-05-31 | Disposition: A | Discharge status: Home / Self Care | Payer: Medicare Other | Source: Ambulatory Visit | Attending: Vascular Surgery | Admitting: Vascular Surgery

## 2013-05-31 ENCOUNTER — Encounter: Payer: Self-pay | Admitting: Vascular Surgery

## 2013-05-31 VITALS — BP 156/65 | HR 67 | Ht 71.0 in | Wt 220.0 lb

## 2013-05-31 DIAGNOSIS — I6529 Occlusion and stenosis of unspecified carotid artery: Secondary | ICD-10-CM

## 2013-05-31 DIAGNOSIS — Z48812 Encounter for surgical aftercare following surgery on the circulatory system: Secondary | ICD-10-CM

## 2013-05-31 NOTE — Progress Notes (Signed)
[facility] [place]  Patient name: DACEN FRAYRE MRN: [id number] DOB: 1931/06/04 Sex: male  REASON FOR VISIT: Follow up of bilateral carotid endarterectomies  HPI: MANCIL PFENNING is a 80 y.o. male who had a right carotid endarterectomy in March of 2013. His left carotid endarterectomy was in February of 2013. He comes in for a yearly follow up visit.  He denies any history of stroke, TIAs, expressive or receptive aphasia, or amaurosis fugax. They have been no significant changes in his medical history.   Past Medical History  Diagnosis Date  . Diabetes mellitus type 2   . GERD (gastroesophageal reflux disease)   . Hyperlipidemia     takes Niacin nightly  . DJD (degenerative joint disease)   . Hypertension     takes Metoprolol and Ramipril daily  . Dizziness   . Gout     hx of--64yrs ago  . Bruises easily     pt is on Plavix and ASA  . Shortness of breath     wakes pt up during night with occ shortness of breathe;states it happens about once a month  . Impaired hearing   . Urinary frequency   . UTI (lower urinary tract infection)     hx of--03/2010  . Anemia     takes Glyburide bid  . CAD (coronary artery disease) of artery bypass graft   . Peripheral vascular disease   . Pneumonia     "at least twice that I know of"  . Seizures 05/14/11    "first time was today"  . CVA (cerebral vascular accident) ~ 2009    right arm/leg occ hurts  . Carotid artery occlusion    Family History  Problem Relation Age of Onset  . Diabetes Sister   . Cancer Brother   . Diabetes Brother   . Anesthesia problems Neg Hx   . Hypotension Neg Hx   . Malignant hyperthermia Neg Hx   . Pseudochol deficiency Neg Hx   . Hearing loss Father    SOCIAL HISTORY: History  Substance Use Topics  . Smoking status: Former Smoker -- 1.00 packs/day for 20 years    Types: Cigarettes    Quit date: 03/02/1974  . Smokeless tobacco: Never Used  . Alcohol Use: No   Comment: quit in 1981   Allergies  Allergen Reactions  . Fexofenadine Hcl Other (See Comments)    Heart attack symptoms  . Flonase [Fluticasone Propionate] Other (See Comments)    Heart attack symptoms  . Nasal Spray Other (See Comments)    Heart attack symptoms   . Nasal Spray     CAN NOT TOLERATE ANY NASAL SPRAYS!!!!!  Causes heart attack symptoms   Current Outpatient Prescriptions  Medication Sig Dispense Refill  . ACCU-CHEK AVIVA PLUS test strip TEST TWICE A DAY  100 each  5  . alendronate (FOSAMAX) 35 MG tablet Take 1 tablet (35 mg total) by mouth every 7 (seven) days. Take with a full glass of water on an empty stomach.  13 tablet  3  . aspirin EC 81 MG EC tablet Take 1 tablet (81 mg total) by mouth 2 (two) times daily.      . Calcium Carb-Cholecalciferol (CALCIUM 600+D3) 600-800 MG-UNIT TABS Take 1 tablet by mouth 2 (two) times daily.      . citalopram (CELEXA) 20 MG tablet Take 1 tablet (20 mg total) by mouth daily.  90 tablet  0  . clopidogrel (PLAVIX) 75 MG tablet  Take 1 tablet (75 mg total) by mouth daily with breakfast.  90 tablet  3  . fish oil-omega-3 fatty acids 1000 MG capsule Take 1 g by mouth every morning. Contains Omega 3, 6, and 9      . Flaxseed, Linseed, (FLAX SEED OIL PO) Take 1,300 mg by mouth every morning.       . glyBURIDE (DIABETA) 2.5 MG tablet Take 1 tablet (2.5 mg total) by mouth 2 (two) times daily.  180 tablet  3  . meclizine (ANTIVERT) 25 MG tablet Take 1 tablet (25 mg total) by mouth 3 (three) times daily as needed for dizziness.  30 tablet  2  . metoprolol (LOPRESSOR) 50 MG tablet TAKE ONE-HALF TABLET (25 MG) TWICE A DAY (APPOINTMENT WITH DOCTOR HOCHREIN 03/2013)  90 tablet  3  . Multiple Vitamin (MULITIVITAMIN WITH MINERALS) TABS Take 1 tablet by mouth daily.      . niacin (NIASPAN) 1000 MG CR tablet Take 1 tablet (1,000 mg total) by mouth at bedtime.  90 tablet  3  . ramipril (ALTACE) 2.5 MG capsule TAKE 1 CAPSULE DAILY (APPOINTMENT WITH DOCTOR  HOCHREIN 03/2013)  90 capsule  3  . simvastatin (ZOCOR) 20 MG tablet TAKE 1 TABLET AT BEDTIME (APPOINTMENT WITH DOCTOR HOCHREIN 03/2013)  90 tablet  3  . Tamsulosin HCl (FLOMAX) 0.4 MG CAPS Take 0.4 mg by mouth daily.       No current facility-administered medications for this visit.   REVIEW OF SYSTEMS: Arly.Keller ] denotes positive finding; [  ] denotes negative finding  CARDIOVASCULAR:  [ ]  chest pain   [ ]  chest pressure   [ ]  palpitations   [ ]  orthopnea   [ ]  dyspnea on exertion   [ ]  claudication   [ ]  rest pain   [ ]  DVT   [ ]  phlebitis PULMONARY:   [ ]  productive cough   [ ]  asthma   [ ]  wheezing NEUROLOGIC:   [ ]  weakness  [ ]  paresthesias  [ ]  aphasia  [ ]  amaurosis  Arly.Keller ] dizziness HEMATOLOGIC:   [ ]  bleeding problems   [ ]  clotting disorders MUSCULOSKELETAL:  [ ]  joint pain   [ ]  joint swelling [ ]  leg swelling GASTROINTESTINAL: [ ]   blood in stool  [ ]   hematemesis GENITOURINARY:  [ ]   dysuria  [ ]   hematuria PSYCHIATRIC:  [ ]  history of major depression INTEGUMENTARY:  [ ]  rashes  [ ]  ulcers CONSTITUTIONAL:  [ ]  fever   [ ]  chills  PHYSICAL EXAM: Filed Vitals:   05/31/13 1440 05/31/13 1442  BP: 140/71 156/65  Pulse: 67   Height: 5\' 11"  (1.803 m)   Weight: 220 lb (99.791 kg)   SpO2: 97%    Body mass index is 30.7 kg/(m^2). GENERAL: The patient is a well-nourished male, in no acute distress. The vital signs are documented above. CARDIOVASCULAR: There is a regular rate and rhythm. I do not detect carotid bruits. PULMONARY: There is good air exchange bilaterally without wheezing or rales. ABDOMEN: Soft and non-tender with normal pitched bowel sounds.  MUSCULOSKELETAL: There are no major deformities or cyanosis. NEUROLOGIC: No focal weakness or paresthesias are detected. SKIN: There are no ulcers or rashes noted. PSYCHIATRIC: The patient has a normal affect.  DATA:  I have independently interpreted his carotid duplex scan which shows widely patent carotid endarterectomy sites  bilaterally. There is some mild intimal hyperplasia at the proximal patch on the right. This does not  appear to be significant.  MEDICAL ISSUES:  Occlusion and stenosis of carotid artery without mention of cerebral infarction Both carotid endarterectomy sites are widely patent. I were to follow carotid duplex in 1 year and I'll see him back at that time. He knows to call sooner if he has any problems. In the meantime he knows to continue taking his aspirin.     Return in about 1 year (around 06/01/2014).  VASCULAR QUALITY INITIATIVE FOLLOW UP DATA:  Current smoker: [  ] yes  [ X ] no  Living status: Arly.Keller[X  ]  Home  [  ] Nursing home  [  ] Homeless    MEDS:  ASA Arly.Keller[X  ] yes  [  ] no- [  ] medical reason  [  ] non compliant  STATIN  Arly.Keller[X ] yes  [  ] no- [  ] medical reason  [  ] non compliant  Beta blocker Arly.Keller[X  ] yes  [  ] no- [  ] medical reason  [  ] non compliant  P2Y12 Antagonist [  ] none  [ X ] clopidogrel-Plavix  [  ] ticlopidine-Ticlid   [  ] prasugrel-Effient  [  ] ticagrelor- Brilinta    Anticoagulant Arly.Keller[X  ] None  [  ] warfarin  [  ] rivaroxaban-Xarelto [  ] dabigatran- Pradaxa  Neurologic event since D/C:  [ X ] no  [  ] yes: [  ] eye event  [  ] cortical event  [  ] VB event  [  ] non specific event  [  ] right  [  ] left  [  ] TIA  [  ] stroke  Date:   Modified Rankin Score: 0  MI since D/C: [ X ] no  [  ] troponin only  [  ] EKG or clinical  Cranial nerve injury: [ X ] none  [  ] resolved  [  ] persistent  Duplex CEA site: [  ] no  Arly.Keller[X  ] yes - Stenosis= [ X ] <40% [  ] 40-59% [  ] 60-79%  [  ] > 80%  [  ]  Occluded  CEA site re-operation:  Arly.Keller[X  ] no   [  ] yes- date of re-op:  CEA site PCI:   [ X ] no   [  ] yes- date of PCI:  Felipe Paluch S [facility] [place] [facility]per: [phone]

## 2013-05-31 NOTE — Assessment & Plan Note (Signed)
Both carotid endarterectomy sites are widely patent. I were to follow carotid duplex in 1 year and I'll see him back at that time. He knows to call sooner if he has any problems. In the meantime he knows to continue taking his aspirin.

## 2013-05-31 NOTE — Addendum Note (Signed)
Addended by: Adria DillELDRIDGE-LEWIS, Sarra Rachels L on: 05/31/2013 05:48 PM   Modules accepted: Orders

## 2013-08-02 ENCOUNTER — Encounter: Payer: Self-pay | Admitting: *Deleted

## 2013-08-11 ENCOUNTER — Ambulatory Visit (INDEPENDENT_AMBULATORY_CARE_PROVIDER_SITE_OTHER): Payer: Medicare Other | Admitting: Physician Assistant

## 2013-08-11 ENCOUNTER — Ambulatory Visit: Payer: Medicare Other | Admitting: General Practice

## 2013-08-11 ENCOUNTER — Encounter: Payer: Self-pay | Admitting: Physician Assistant

## 2013-08-11 VITALS — BP 126/61 | HR 69 | Temp 98.6°F | Ht 71.0 in | Wt 220.0 lb

## 2013-08-11 DIAGNOSIS — E1165 Type 2 diabetes mellitus with hyperglycemia: Secondary | ICD-10-CM

## 2013-08-11 DIAGNOSIS — IMO0002 Reserved for concepts with insufficient information to code with codable children: Secondary | ICD-10-CM

## 2013-08-11 DIAGNOSIS — E118 Type 2 diabetes mellitus with unspecified complications: Principal | ICD-10-CM

## 2013-08-11 DIAGNOSIS — I6529 Occlusion and stenosis of unspecified carotid artery: Secondary | ICD-10-CM

## 2013-08-11 DIAGNOSIS — I10 Essential (primary) hypertension: Secondary | ICD-10-CM

## 2013-08-11 DIAGNOSIS — I2510 Atherosclerotic heart disease of native coronary artery without angina pectoris: Secondary | ICD-10-CM

## 2013-08-11 DIAGNOSIS — E785 Hyperlipidemia, unspecified: Secondary | ICD-10-CM

## 2013-08-11 LAB — POCT GLYCOSYLATED HEMOGLOBIN (HGB A1C): HEMOGLOBIN A1C: 7

## 2013-08-11 NOTE — Progress Notes (Signed)
Subjective:     Patient ID: [name], male   DOB: 1932/01/11, 80 y.o.   MRN: [id number]  HPI Pt here for recheck of his NIDDM, HTN, NIDDM States he is doing well He has regular f/u with Dr Irving Shows- pod. Pt also see Cardiologist yrly He is needing eye exam  Review of Systems  Constitutional: Negative for activity change, appetite change, fatigue and unexpected weight change.  Respiratory: Negative for chest tightness and shortness of breath.   Cardiovascular: Negative for chest pain and palpitations.  Endocrine: Negative for polydipsia and polyuria.       Objective:   Physical Exam  Constitutional: He appears well-developed and well-nourished.  Neck: No JVD present. No thyromegaly present.  Cardiovascular: Normal rate, regular rhythm, normal heart sounds and intact distal pulses.   No murmur heard. Pulmonary/Chest: Effort normal and breath sounds normal.  Musculoskeletal: He exhibits no edema.  Trace edema bilat Sensory nl No ulceration lesions seen  Lymphadenopathy:    He has no cervical adenopathy.  Psychiatric: He has a normal mood and affect. His behavior is normal. Judgment and thought content normal.       Assessment:     Type II or unspecified type diabetes mellitus with unspecified complication, uncontrolled - Plan: POCT glycosylated hemoglobin (Hb A1C)  HTN (hypertension) - Plan: CMP14+EGFR, Hepatic function panel  Other and unspecified hyperlipidemia - Plan: Hepatic function panel, Lipid panel  CAD (coronary artery disease) - Plan: Hepatic function panel, Lipid panel      Plan:     Full labs done today Will inform of lab results Continue nightly foot checks and Pod checks Pt aware he needs eye exam Continue with diet and exercise Cont reg f/u with Card F/U in 4 months

## 2013-08-11 NOTE — Patient Instructions (Signed)
Diabetic Retinopathy Diabetic retinopathy is a disease of the light-sensitive membrane at the back of the eye (retina). It is a complication of diabetes and a common cause of blindness. Early detection of the disease is key to keeping your eyes healthy.  CAUSES  Diabetic retinopathy is caused by blood sugar (glucose) levels that are too high over an extended period of time. High blood sugars cause damage to the small blood vessels of the retina, allowing blood to leak through the vessel walls. This causes visual impairment and eventually blindness. RISK FACTORS  High blood pressure.  Having diabetes for a long time.  Having poorly controlled blood sugars. SIGNS AND SYMPTOMS  In the early stages of diabetic retinopathy, there are often no symptoms. As the condition advances, symptoms may include:  Blurred vision. This is usually caused by a swelling due to abnormal blood glucose levels. The blurriness may go away when blood glucose levels return to normal.  Moving specks or dark spots (floaters) in your vision. These can be caused by a small retinal hemorrhage. A hemorrhage is bleeding from blood vessels.  Missing parts of your field of vision, such as things at the side. This can be caused by larger retinal hemorrhages.  Difficulty reading books or signs.  Double vision.  Pain in one or both eyes.  Feeling pressure in one or both eyes.  Trouble seeing straight lines. Straight lines do not look straight.  Redness of the eyes that does not go away. DIAGNOSIS  Your eye care specialist can detect changes in the blood vessels of your eye by putting drops in your eyes that enlarge (dilate) your pupils. This allows your eye care specialist to get a good look at your retina to see if there are any changes that have occurred as a result of your diabetes. You should have your eyes examined once a year. TREATMENT  Your eye care specialist may use a special laser beam to seal the blood vessels  of the retina and stop them from leaking. Early detection and treatment are important so that further damage to your eyes can be prevented. In addition, managing your blood sugars and keeping them in the target range can slow the progress of the disease. HOME CARE INSTRUCTIONS   Keep your blood pressure within your target range.  Keep your blood glucose levels within your target range.  Follow your health care provider's instructions regarding diet and other means for controlling your blood glucose levels.  Check your blood levels for glucose as recommended by your health care provider.  Keep regular appointments with your eye specialist. An eye specialist can usually see diabetic retinopathy developing long before it starts causing problems. In many cases, it can be treated to prevent complications from occurring. If you have diabetes, you should have your eyes checked at least every year. Your risk of retinopathy increases the longer you have the disease.  If you smoke, quit. Ask your health care provider for help if needed. Smoking can make retinopathy worse. SEEK MEDICAL CARE IF:   You notice gradual blurring or other changes in your vision over time.  You notice that your glasses or contact lenses do not make things look as sharp as they once did.  You have trouble reading or seeing details at a distance with either eye.  You notice a sudden change in your vision or notice that parts of your field of vision appear missing or hazy.  You suddenly see moving specks or dark spots   in the field of vision of either eye.  You have sudden partial loss of vision in either eye. Document Released: 02/14/2000 Document Revised: 12/07/2012 Document Reviewed: 08/08/2012 [employer] Patient Information 2014 [employer] [place].

## 2013-08-12 LAB — CMP14+EGFR
ALBUMIN: 4 g/dL (ref 3.5–4.8)
ALT: 15 IU/L (ref 0–44)
AST: 14 IU/L (ref 0–40)
Albumin/Globulin Ratio: 1.5 (ref 1.1–2.5)
Alkaline Phosphatase: 41 IU/L (ref 39–117)
BUN/Creatinine Ratio: 13 (ref 10–22)
BUN: 19 mg/dL (ref 8–27)
CALCIUM: 8.9 mg/dL (ref 8.6–10.2)
CO2: 26 mmol/L (ref 18–29)
Chloride: 100 mmol/L (ref 97–108)
Creatinine, Ser: 1.51 mg/dL — ABNORMAL HIGH (ref 0.76–1.27)
GFR calc non Af Amer: 43 mL/min/{1.73_m2} — ABNORMAL LOW (ref >59)
GFR, EST AFRICAN AMERICAN: 50 mL/min/{1.73_m2} — AB (ref >59)
GLUCOSE: 104 mg/dL — AB (ref 65–99)
Globulin, Total: 2.7 g/dL (ref 1.5–4.5)
Potassium: 4.6 mmol/L (ref 3.5–5.2)
Sodium: 141 mmol/L (ref 134–144)
TOTAL PROTEIN: 6.7 g/dL (ref 6.0–8.5)
Total Bilirubin: 0.2 mg/dL (ref 0.0–1.2)

## 2013-08-12 LAB — LIPID PANEL
Chol/HDL Ratio: 4.1 ratio units (ref 0.0–5.0)
Cholesterol, Total: 123 mg/dL (ref 100–199)
HDL: 30 mg/dL — AB (ref >39)
LDL CALC: 78 mg/dL (ref 0–99)
TRIGLYCERIDES: 73 mg/dL (ref 0–149)
VLDL Cholesterol Cal: 15 mg/dL (ref 5–40)

## 2013-08-23 ENCOUNTER — Encounter (INDEPENDENT_AMBULATORY_CARE_PROVIDER_SITE_OTHER): Payer: Medicare Other | Admitting: Ophthalmology

## 2013-08-23 DIAGNOSIS — E1165 Type 2 diabetes mellitus with hyperglycemia: Secondary | ICD-10-CM

## 2013-08-23 DIAGNOSIS — H43819 Vitreous degeneration, unspecified eye: Secondary | ICD-10-CM

## 2013-08-23 DIAGNOSIS — E11319 Type 2 diabetes mellitus with unspecified diabetic retinopathy without macular edema: Secondary | ICD-10-CM

## 2013-08-23 DIAGNOSIS — E1139 Type 2 diabetes mellitus with other diabetic ophthalmic complication: Secondary | ICD-10-CM

## 2013-08-23 DIAGNOSIS — H35039 Hypertensive retinopathy, unspecified eye: Secondary | ICD-10-CM

## 2013-08-23 DIAGNOSIS — I10 Essential (primary) hypertension: Secondary | ICD-10-CM

## 2013-08-23 DIAGNOSIS — H26499 Other secondary cataract, unspecified eye: Secondary | ICD-10-CM

## 2013-08-23 DIAGNOSIS — H35379 Puckering of macula, unspecified eye: Secondary | ICD-10-CM

## 2013-09-18 ENCOUNTER — Encounter (INDEPENDENT_AMBULATORY_CARE_PROVIDER_SITE_OTHER): Payer: Medicare Other | Admitting: Ophthalmology

## 2013-09-18 DIAGNOSIS — H2700 Aphakia, unspecified eye: Secondary | ICD-10-CM

## 2013-09-20 ENCOUNTER — Ambulatory Visit (INDEPENDENT_AMBULATORY_CARE_PROVIDER_SITE_OTHER): Payer: Medicare Other | Admitting: Family Medicine

## 2013-09-20 ENCOUNTER — Encounter: Payer: Self-pay | Admitting: Family Medicine

## 2013-09-20 VITALS — BP 120/56 | HR 76 | Temp 97.1°F | Ht 71.0 in | Wt 222.0 lb

## 2013-09-20 DIAGNOSIS — F03918 Unspecified dementia, unspecified severity, with other behavioral disturbance: Secondary | ICD-10-CM

## 2013-09-20 DIAGNOSIS — F0391 Unspecified dementia with behavioral disturbance: Secondary | ICD-10-CM

## 2013-09-20 DIAGNOSIS — I6529 Occlusion and stenosis of unspecified carotid artery: Secondary | ICD-10-CM

## 2013-09-20 MED ORDER — DONEPEZIL HCL 5 MG PO TABS: 5.0000 mg | ORAL_TABLET | Freq: Every day | ORAL | Status: DC

## 2013-09-20 NOTE — Patient Instructions (Signed)
We discussed using his brain with exercises such as crossword puzzles, reading, and word finding and he promised to try some of these exercises

## 2013-09-20 NOTE — Progress Notes (Signed)
   Subjective:    Patient ID: [name] JarvisSylvester H Tedesco, male    DOB: Jan 04, 1932, 80 y.o.   MRN: [id number]  HPI 80 year old gentleman who is brought in today by his daughter-in-law with chief complaint of memory issues as well as behavioral disturbance and anger. He has had admitted problems with memory for the last year or so. 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   Eyes: Negative.   Respiratory: Negative.   Cardiovascular: Negative.   Gastrointestinal: Negative.   Endocrine: Negative.   Psychiatric/Behavioral: Positive for confusion and decreased concentration.       Objective:   Physical Exam  Psychiatric:  Focused exam today on trying to ascertain presence of dementia. He remembered the day but not the date. He is able to recall his birth date and phone number and items in the distant past but could not remember where he was last night or what he ate for supper. He recalls one of 3 words at 5 minutes. He was able to do the clock draw appropriately but is at traction skills were poor  I suspect he does have some early dementia. He does not appear depressed as that could also affect his concentration and memory          Assessment & Plan:  1. Dementia with behavioral disturbance Will begin Aricept 5 mg to take at bedtime and continue that dose for one month at which time he will return Frederica KusterStephen M Britnay Magnussen MD

## 2013-09-21 ENCOUNTER — Telehealth: Payer: Self-pay | Admitting: Family Medicine

## 2013-09-21 NOTE — Telephone Encounter (Signed)
Aricept was sent to [facility]. This would have been processed and ran through the insurance yesterday evening. Insurance will not cover the medicine at a local pharmacy and mail order this close together.  They will wait on mail order to arrive.

## 2013-09-26 ENCOUNTER — Telehealth: Payer: Self-pay | Admitting: Family Medicine

## 2013-09-26 MED ORDER — CLOPIDOGREL BISULFATE 75 MG PO TABS: 75.0000 mg | ORAL_TABLET | Freq: Every day | ORAL | Status: DC

## 2013-09-26 NOTE — Telephone Encounter (Signed)
They said we were to call in medication for alzheimer into [facility] please advise

## 2013-09-27 ENCOUNTER — Other Ambulatory Visit: Payer: Self-pay | Admitting: *Deleted

## 2013-09-27 MED ORDER — DONEPEZIL HCL 5 MG PO TABS: 5.0000 mg | ORAL_TABLET | Freq: Every day | ORAL | Status: DC

## 2013-09-27 NOTE — Telephone Encounter (Signed)
Okay to re-do script for aricept, 5 mg

## 2013-09-27 NOTE — Telephone Encounter (Signed)
[facility] notified

## 2013-09-29 MED ORDER — DONEPEZIL HCL 5 MG PO TABS: 5.0000 mg | ORAL_TABLET | Freq: Every day | ORAL | Status: DC

## 2013-09-29 NOTE — Telephone Encounter (Signed)
[facility]

## 2013-10-05 ENCOUNTER — Telehealth: Payer: Self-pay | Admitting: Family Medicine

## 2013-10-06 NOTE — Telephone Encounter (Signed)
Called in patient aware.  

## 2013-10-06 NOTE — Telephone Encounter (Signed)
Would try lorazepam .5 mg HS, #30; okay to call in

## 2013-10-17 ENCOUNTER — Telehealth: Payer: Self-pay | Admitting: Cardiology

## 2013-10-17 MED ORDER — METOPROLOL TARTRATE 25 MG PO TABS: 25.0000 mg | ORAL_TABLET | Freq: Two times a day (BID) | ORAL | Status: DC

## 2013-10-17 NOTE — Telephone Encounter (Signed)
Due to manufacturing problems [facility] is unable to get the 50mg  tablets of Metoprolol Tart.  Wanting to switch to the 100mg  tabs.  Instructed patient takes 25mg  bid so the pharmacy will decrease the tablet strength to 25mg  and let patient know this is a strength change.  Will also call patient and warn of strength change and to be careful he reads the label before taking.  Wife notified and voiced understanding.

## 2013-10-17 NOTE — Telephone Encounter (Signed)
Needs to speak with someone regarding the patient's Metoprolol Tartrate---this medication is out of stock.  When calling, please reference [id number]

## 2013-10-23 ENCOUNTER — Telehealth: Payer: Self-pay | Admitting: Family Medicine

## 2013-10-24 ENCOUNTER — Telehealth: Payer: Self-pay | Admitting: *Deleted

## 2013-10-24 MED ORDER — LORAZEPAM 0.5 MG PO TABS: 0.5000 mg | ORAL_TABLET | Freq: Two times a day (BID) | ORAL | Status: DC | PRN

## 2013-10-24 MED ORDER — DONEPEZIL HCL 10 MG PO TABS: 10.0000 mg | ORAL_TABLET | Freq: Every day | ORAL | Status: DC

## 2013-10-24 NOTE — Telephone Encounter (Signed)
Pt aware to pickup lorazepam

## 2013-10-24 NOTE — Telephone Encounter (Signed)
done

## 2013-10-30 ENCOUNTER — Telehealth: Payer: Self-pay | Admitting: Family Medicine

## 2013-10-30 MED ORDER — DONEPEZIL HCL 10 MG PO TABS: 10.0000 mg | ORAL_TABLET | Freq: Every day | ORAL | Status: DC

## 2013-10-30 NOTE — Telephone Encounter (Signed)
done

## 2013-10-30 NOTE — Telephone Encounter (Signed)
He is getting very agitated he cant sleep what do you think he should do?

## 2013-10-31 NOTE — Telephone Encounter (Signed)
I'm confused because I okay'ed a script for lorazepam last week

## 2013-11-01 NOTE — Telephone Encounter (Signed)
They are aware to do 1 mg of the lorazepam at night told to call back if it is no better

## 2013-11-01 NOTE — Telephone Encounter (Signed)
Increase lorazepam t0 1 mg HS

## 2013-11-01 NOTE — Telephone Encounter (Signed)
Patient states that the lorazepam is not working for the patient.

## 2013-11-09 ENCOUNTER — Ambulatory Visit: Payer: Medicare Other | Admitting: Family Medicine

## 2013-11-14 ENCOUNTER — Telehealth: Payer: Self-pay | Admitting: Family Medicine

## 2013-11-14 NOTE — Telephone Encounter (Signed)
Pt wanted to be seen for wheezing with Dr Hyacinth Meeker appt scheduled with Dr Hyacinth Meeker

## 2013-11-15 ENCOUNTER — Encounter: Payer: Self-pay | Admitting: Family Medicine

## 2013-11-15 ENCOUNTER — Ambulatory Visit (INDEPENDENT_AMBULATORY_CARE_PROVIDER_SITE_OTHER): Payer: Medicare Other | Admitting: Family Medicine

## 2013-11-15 VITALS — BP 129/59 | HR 60 | Temp 97.4°F | Ht 71.0 in | Wt 220.0 lb

## 2013-11-15 DIAGNOSIS — F03918 Unspecified dementia, unspecified severity, with other behavioral disturbance: Secondary | ICD-10-CM

## 2013-11-15 DIAGNOSIS — I6529 Occlusion and stenosis of unspecified carotid artery: Secondary | ICD-10-CM

## 2013-11-15 DIAGNOSIS — F0391 Unspecified dementia with behavioral disturbance: Secondary | ICD-10-CM

## 2013-11-15 DIAGNOSIS — R059 Cough, unspecified: Secondary | ICD-10-CM

## 2013-11-15 DIAGNOSIS — E785 Hyperlipidemia, unspecified: Secondary | ICD-10-CM

## 2013-11-15 DIAGNOSIS — R05 Cough: Secondary | ICD-10-CM

## 2013-11-15 MED ORDER — QUETIAPINE FUMARATE 25 MG PO TABS: 25.0000 mg | ORAL_TABLET | Freq: Every day | ORAL | Status: DC

## 2013-11-15 NOTE — Progress Notes (Signed)
   Subjective:    Patient ID: [name], male    DOB: 05-Jan-1932, 80 y.o.   MRN: [id number]  HPI  69 your gentleman here with chief complaint of paroxysms of cough and wheezing. There are no known triggers or allergens but it certainly sounds like this is a possibility. I had seen him previously for sundowning and we have been using lorazepam. The daughter-in-law who accompanies him today feels like this is no longer effective.    Review of Systems  Constitutional: Negative.   HENT: Negative.   Eyes: Negative.   Respiratory: Positive for cough and wheezing. Negative for shortness of breath.   Cardiovascular: Negative.  Negative for chest pain and leg swelling.  Gastrointestinal: Negative.   Genitourinary: Negative.   Musculoskeletal: Negative.   Skin: Negative.   Neurological: Negative.   Psychiatric/Behavioral: Negative.   All other systems reviewed and are negative.      Objective:   Physical Exam  Constitutional: He is oriented to person, place, and time. He appears well-developed and well-nourished.  HENT:  Head: Normocephalic.  Right Ear: External ear normal.  Left Ear: External ear normal.  Nose: Nose normal.  Mouth/Throat: Oropharynx is clear and moist.  Eyes: Conjunctivae and EOM are normal. Pupils are equal, round, and reactive to light.  Neck: Normal range of motion. Neck supple.  Cardiovascular: Normal rate, regular rhythm, normal heart sounds and intact distal pulses.   Pulmonary/Chest: Effort normal and breath sounds normal.  Abdominal: Soft. Bowel sounds are normal.  Musculoskeletal: Normal range of motion.  Neurological: He is alert and oriented to person, place, and time.  Skin: Skin is warm and dry.  Psychiatric: He has a normal mood and affect. His behavior is normal. Judgment and thought content normal.    BP 129/59  Pulse 60  Temp(Src) 97.4 F (36.3 C) (Oral)  Ht  (1.803 m)  Wt 220 lb (99.791 kg)  BMI 30.70 kg/m2        Assessment & Plan:  1. Dementia with behavioral disturbance Will try Seroquel for sundowning sx.  Begin 25 mg and titrate  2. Hyperlipidemia   3. Cough ? Allergic response.  Sample Advair; could use albuterol, prn as 2 nd choice  Frederica Kuster MD

## 2013-11-20 ENCOUNTER — Telehealth: Payer: Self-pay | Admitting: Family Medicine

## 2013-11-20 DIAGNOSIS — R059 Cough, unspecified: Secondary | ICD-10-CM

## 2013-11-20 DIAGNOSIS — R05 Cough: Secondary | ICD-10-CM

## 2013-11-20 NOTE — Telephone Encounter (Signed)
Patient doesn't want to come in at this time. He isn't taking anything OTC for cough. He was prescribed Advair which he is using. Cough has worsened slightly. Advised to take Delsym OTC. Check with pharmacist to make sure it won't interact with current medications. Schedule appt if cough worsens or persists. Caregiver stated understanding and agreement to plan.

## 2013-11-20 NOTE — Telephone Encounter (Signed)
This patient needs to make an appointment to come in to be seen because of the worsening cough

## 2013-11-27 ENCOUNTER — Encounter: Payer: Self-pay | Admitting: Family Medicine

## 2013-11-27 ENCOUNTER — Ambulatory Visit (INDEPENDENT_AMBULATORY_CARE_PROVIDER_SITE_OTHER): Payer: Medicare Other | Admitting: Family Medicine

## 2013-11-27 VITALS — BP 141/71 | HR 74 | Temp 98.1°F | Ht 71.0 in | Wt 215.0 lb

## 2013-11-27 DIAGNOSIS — J209 Acute bronchitis, unspecified: Secondary | ICD-10-CM

## 2013-11-27 DIAGNOSIS — J208 Acute bronchitis due to other specified organisms: Secondary | ICD-10-CM

## 2013-11-27 DIAGNOSIS — I6529 Occlusion and stenosis of unspecified carotid artery: Secondary | ICD-10-CM

## 2013-11-27 MED ORDER — METHYLPREDNISOLONE ACETATE 80 MG/ML IJ SUSP
80.0000 mg | Freq: Once | INTRAMUSCULAR | Status: AC
  Administered 2013-11-27: 80 mg via INTRAMUSCULAR

## 2013-11-27 MED ORDER — AMOXICILLIN 875 MG PO TABS: 875.0000 mg | ORAL_TABLET | Freq: Two times a day (BID) | ORAL | Status: DC

## 2013-11-27 MED ORDER — HYDROCODONE-HOMATROPINE 5-1.5 MG/5ML PO SYRP: 5.0000 mL | ORAL_SOLUTION | Freq: Three times a day (TID) | ORAL | Status: DC | PRN

## 2013-11-27 NOTE — Progress Notes (Signed)
   Subjective:    Patient ID: [name], male    DOB: 02/04/1932, 80 y.o.   MRN: [id number]  HPI This 80 y.o. male presents for evaluation of persistent productive cough and URI sx's.   Review of Systems    No chest pain, SOB, HA, dizziness, vision change, N/V, diarrhea, constipation, dysuria, urinary urgency or frequency, myalgias, arthralgias or rash.  Objective:   Physical Exam Vital signs noted  Well developed well nourished male.  HEENT - Head atraumatic Normocephalic                Eyes - PERRLA, Conjuctiva - clear Sclera- Clear EOMI                Ears - EAC's Wnl TM's Wnl Gross Hearing WNL                 Throat - oropharanx wnl Respiratory - Lungs with expiratory wheezes scattered. Cardiac - RRR S1 and S2 without murmur GI - Abdomen soft Nontender and bowel sounds active x 4 Extremities - No edema. Neuro - Grossly intact.       Assessment & Plan:  Acute bronchitis due to other specified organisms - Plan: methylPREDNISolone acetate (DEPO-MEDROL) injection 80 mg, HYDROcodone-homatropine (HYCODAN) 5-1.5 MG/5ML syrup, amoxicillin (AMOXIL) 875 MG tablet  Deatra Canter FNP

## 2013-12-04 ENCOUNTER — Ambulatory Visit (INDEPENDENT_AMBULATORY_CARE_PROVIDER_SITE_OTHER): Payer: Medicare Other | Admitting: Nurse Practitioner

## 2013-12-04 ENCOUNTER — Encounter: Payer: Self-pay | Admitting: Nurse Practitioner

## 2013-12-04 VITALS — BP 145/77 | HR 115 | Temp 98.3°F | Ht 71.0 in | Wt 213.8 lb

## 2013-12-04 DIAGNOSIS — I6529 Occlusion and stenosis of unspecified carotid artery: Secondary | ICD-10-CM

## 2013-12-04 DIAGNOSIS — J208 Acute bronchitis due to other specified organisms: Secondary | ICD-10-CM

## 2013-12-04 MED ORDER — HYDROCODONE-HOMATROPINE 5-1.5 MG/5ML PO SYRP: 5.0000 mL | ORAL_SOLUTION | Freq: Three times a day (TID) | ORAL | Status: DC | PRN

## 2013-12-04 NOTE — Patient Instructions (Signed)

## 2013-12-04 NOTE — Progress Notes (Signed)
   Subjective:    Patient ID: Markus JarvisSylvester H Motton, male    DOB: 02/16/1932, 80 y.o.   MRN: [id number]  HPI Patient in today c/o URI not improving- Has been seen 2 times with same complaint- Saw [facility] on 11/27/13 and was given prednisone and amoxicillin- He says that he feels no better and that he is out of cough syrup. He claims that he still has a fever because he starts sweating and he takes tylenol.    Review of Systems  Constitutional: Negative for fever and chills.  HENT: Positive for congestion.   Respiratory: Positive for cough.   Cardiovascular: Negative.   Gastrointestinal: Negative.   Genitourinary: Negative.   Neurological: Negative.   Psychiatric/Behavioral: Negative.   All other systems reviewed and are negative.      Objective:   Physical Exam  Constitutional: He is oriented to person, place, and time. He appears well-developed and well-nourished.  HENT:  Right Ear: External ear normal.  Left Ear: External ear normal.  Nose: Nose normal.  Mouth/Throat: Oropharynx is clear and moist.  Eyes: Pupils are equal, round, and reactive to light.  Neck: Normal range of motion. Neck supple.  Cardiovascular: Normal rate, regular rhythm and normal heart sounds.   Pulmonary/Chest: Effort normal. He has wheezes (insp wheezes that clear with coughing).  Neurological: He is alert and oriented to person, place, and time.  Skin: Skin is warm and dry.  Psychiatric: He has a normal mood and affect. His behavior is normal. Judgment and thought content normal.   BP 145/77  Pulse 115  Temp(Src) 98.3 F (36.8 C) (Oral)  Ht 5\' 11"  (1.803 m)  Wt 213 lb 12.8 oz (96.979 kg)  BMI 29.83 kg/m2        Assessment & Plan:  1. Acute bronchitis due to other specified organisms 1. Take meds as prescribed 2. Use a cool mist humidifier especially during the winter months and when heat has been humid. 3. Use saline nose sprays frequently 4. Saline irrigations of the nose can be very  helpful if done frequently.  * 4X daily for 1 week*  * Use of a nettie pot can be helpful with this. Follow directions with this* 5. Drink plenty of fluids 6. Keep thermostat turn down low 7.For any cough or congestion  Use plain Mucinex- regular strength or max strength is fine   * Children- consult with Pharmacist for dosing 8. For fever or aces or pains- take tylenol or ibuprofen appropriate for age and weight.  * for fevers greater than 101 orally you may alternate ibuprofen and tylenol every  3 hours.   - HYDROcodone-homatropine (HYCODAN) 5-1.5 MG/5ML syrup; Take 5 mLs by mouth every 8 (eight) hours as needed for cough.  Dispense: 120 mL; Refill: 0  Mary-Margaret Daphine DeutscherMartin, FNP

## 2013-12-12 ENCOUNTER — Ambulatory Visit (INDEPENDENT_AMBULATORY_CARE_PROVIDER_SITE_OTHER): Payer: Medicare Other | Admitting: Family Medicine

## 2013-12-12 ENCOUNTER — Encounter: Payer: Self-pay | Admitting: Family Medicine

## 2013-12-12 VITALS — BP 120/74 | HR 117 | Temp 97.1°F | Ht 71.0 in | Wt 206.0 lb

## 2013-12-12 DIAGNOSIS — F0391 Unspecified dementia with behavioral disturbance: Secondary | ICD-10-CM

## 2013-12-12 DIAGNOSIS — I6529 Occlusion and stenosis of unspecified carotid artery: Secondary | ICD-10-CM

## 2013-12-12 DIAGNOSIS — F03918 Unspecified dementia, unspecified severity, with other behavioral disturbance: Secondary | ICD-10-CM

## 2013-12-12 DIAGNOSIS — Z23 Encounter for immunization: Secondary | ICD-10-CM

## 2013-12-12 MED ORDER — QUETIAPINE FUMARATE 25 MG PO TABS: 50.0000 mg | ORAL_TABLET | Freq: Every day | ORAL | Status: DC

## 2013-12-12 NOTE — Progress Notes (Signed)
   Subjective:    Patient ID: [name] JarvisSylvester H Dimino, male    DOB: Jul 27, 1931, 80 y.o.   MRN: [id number]  HPI 80 year old male who is here to followup behavioral disturbance secondary to dementia. Since I saw him last he has been here several times and treated for a respiratory infection. He has also been to urgent care and was told he had pneumonia. With those visits he has been treated with amoxicillin, azithromycin, nebulizers, and cough syrup. He has had no fever in recent days. Daughter-in-law has increased his Seroquel to 50 mg at bedtime; that appears to be helping behaviors at night. She is also using lorazepam in the daytime for nervousness and that also helps.    Review of Systems  Respiratory: Positive for cough.   Psychiatric/Behavioral: Positive for behavioral problems, confusion and agitation.       Objective:   Physical Exam  Constitutional: He is oriented to person, place, and time. He appears well-developed and well-nourished.  HENT:  Head: Normocephalic.  Right Ear: External ear normal.  Left Ear: External ear normal.  Nose: Nose normal.  Mouth/Throat: Oropharynx is clear and moist.  Eyes: Conjunctivae and EOM are normal. Pupils are equal, round, and reactive to light.  Neck: Normal range of motion. Neck supple.  Cardiovascular: Normal rate, regular rhythm, normal heart sounds and intact distal pulses.   Pulmonary/Chest: Effort normal and breath sounds normal.  Lungs are clear. I do not hear any wheezes rales or rhonchi.  Abdominal: Soft. Bowel sounds are normal.  Musculoskeletal: Normal range of motion.  Neurological: He is alert and oriented to person, place, and time.  Skin: Skin is warm and dry.  Psychiatric: He has a normal mood and affect. His behavior is normal. Judgment and thought content normal.    BP 120/74  Pulse 117  Temp(Src) 97.1 F (36.2 C) (Oral)  Ht 5\' 11"  (1.803 m)  Wt 206 lb (93.441 kg)  BMI 28.74 kg/m2      Assessment & Plan:  1.  Dementia with behavioral disturbance Continue with Seroquel 50 mg at bedtime  Respiratory infection Seems to be resolving   Frederica KusterStephen M Miller MD

## 2013-12-19 ENCOUNTER — Telehealth: Payer: Self-pay | Admitting: Family Medicine

## 2013-12-19 NOTE — Telephone Encounter (Signed)
Paper up front 

## 2013-12-19 NOTE — Telephone Encounter (Signed)
They need you to write a 30 day supply seroquel

## 2013-12-19 NOTE — Telephone Encounter (Signed)
Pt only received enough med for 15 day supply. [facility] called and spoke with Riki RuskJeremy and will change rx to quantity to 60= 30 day supply.

## 2013-12-20 NOTE — Telephone Encounter (Signed)
Can we print a medication sheet and let me look at it

## 2013-12-21 ENCOUNTER — Other Ambulatory Visit: Payer: Self-pay | Admitting: *Deleted

## 2013-12-21 ENCOUNTER — Other Ambulatory Visit: Payer: Self-pay

## 2013-12-21 MED ORDER — RAMIPRIL 2.5 MG PO CAPS: ORAL_CAPSULE | ORAL | Status: DC

## 2013-12-21 MED ORDER — CLOPIDOGREL BISULFATE 75 MG PO TABS: 75.0000 mg | ORAL_TABLET | Freq: Every day | ORAL | Status: DC

## 2013-12-22 NOTE — Telephone Encounter (Signed)
Already given paper up front.

## 2013-12-28 ENCOUNTER — Telehealth: Payer: Self-pay | Admitting: Family Medicine

## 2013-12-28 NOTE — Telephone Encounter (Signed)
Informed of Dr Rondel BatonMiller's recommendations Verbalizes understanding

## 2013-12-28 NOTE — Telephone Encounter (Signed)
Spoke with pt's daughter Pt is becoming more aggressive and forgetful Can meds be increased or changed to help with this Per Dr Hyacinth MeekerMiller pt can take Seroquel 25mg  in the AM and 50mg  hs

## 2014-01-05 MED ORDER — QUETIAPINE FUMARATE 25 MG PO TABS: 50.0000 mg | ORAL_TABLET | Freq: Every day | ORAL | Status: DC

## 2014-01-05 NOTE — Telephone Encounter (Signed)
Done - called pt

## 2014-01-05 NOTE — Telephone Encounter (Signed)
Please advise 

## 2014-01-05 NOTE — Telephone Encounter (Signed)
This is okay to refill 1 

## 2014-01-09 ENCOUNTER — Other Ambulatory Visit: Payer: Self-pay | Admitting: *Deleted

## 2014-01-09 DIAGNOSIS — F4320 Adjustment disorder, unspecified: Secondary | ICD-10-CM

## 2014-01-09 NOTE — Telephone Encounter (Signed)
Seen last by Dr. Hyacinth MeekerMiller 12/12/13. Please approve med if pt is to continue.

## 2014-01-11 ENCOUNTER — Other Ambulatory Visit: Payer: Self-pay

## 2014-01-11 MED ORDER — CALCIUM CARB-CHOLECALCIFEROL 600-800 MG-UNIT PO TABS: 1.0000 | ORAL_TABLET | Freq: Two times a day (BID) | ORAL | Status: DC

## 2014-01-12 MED ORDER — CITALOPRAM HYDROBROMIDE 20 MG PO TABS: 20.0000 mg | ORAL_TABLET | Freq: Every day | ORAL | Status: DC

## 2014-02-07 ENCOUNTER — Telehealth: Payer: Self-pay | Admitting: Family Medicine

## 2014-02-07 NOTE — Telephone Encounter (Signed)
Please review med list.   Aricept 10 mg g hs,  10 pills given on 10-24-13.   Please advise.

## 2014-02-08 MED ORDER — QUETIAPINE FUMARATE 50 MG PO TABS: ORAL_TABLET | ORAL | Status: DC

## 2014-02-08 NOTE — Telephone Encounter (Signed)
I would increase Seroquel to 100 mg at bedtime and 50 mg in the morning

## 2014-02-08 NOTE — Telephone Encounter (Signed)
Family aware of new script for seroquel.

## 2014-02-08 NOTE — Telephone Encounter (Signed)
Please return call to discuss med changes.

## 2014-02-21 ENCOUNTER — Other Ambulatory Visit: Payer: Self-pay | Admitting: Family Medicine

## 2014-02-21 NOTE — Telephone Encounter (Signed)
Please advise on refill.  It looks like Dr. Hyacinth MeekerMiller increase seroquel from

## 2014-03-03 ENCOUNTER — Other Ambulatory Visit: Payer: Self-pay | Admitting: Cardiology

## 2014-03-03 ENCOUNTER — Other Ambulatory Visit: Payer: Self-pay | Admitting: Family Medicine

## 2014-03-05 ENCOUNTER — Telehealth: Payer: Self-pay | Admitting: Family Medicine

## 2014-03-05 NOTE — Telephone Encounter (Signed)
Last seen 12/12/13 Dr Hyacinth Meeker  If approved route to nurse pool A to call into [facility]

## 2014-03-05 NOTE — Telephone Encounter (Signed)
Follow up appt scheduled.

## 2014-03-06 NOTE — Telephone Encounter (Signed)
rx called into pharmacy

## 2014-03-20 ENCOUNTER — Telehealth: Payer: Self-pay | Admitting: Family Medicine

## 2014-03-21 NOTE — Telephone Encounter (Signed)
Family would like to discuss placing him in an adult care home.

## 2014-03-22 NOTE — Telephone Encounter (Signed)
Generally when behaviors aren't is to in patients cannot be managed in the home we'll look to assisted living which hasn't Alzheimer's and memory care units [place] would [place]

## 2014-03-30 NOTE — Telephone Encounter (Signed)
Patient daughter in law states that he has appointment with Dr. Hyacinth MeekerMiller on 2/2

## 2014-04-03 ENCOUNTER — Encounter: Payer: Self-pay | Admitting: Family Medicine

## 2014-04-03 ENCOUNTER — Ambulatory Visit: Payer: Self-pay | Admitting: Family Medicine

## 2014-04-03 ENCOUNTER — Ambulatory Visit (INDEPENDENT_AMBULATORY_CARE_PROVIDER_SITE_OTHER): Payer: Medicare Other | Admitting: Family Medicine

## 2014-04-03 VITALS — BP 162/85 | HR 109 | Temp 97.3°F | Ht 71.0 in | Wt 223.0 lb

## 2014-04-03 DIAGNOSIS — F0391 Unspecified dementia with behavioral disturbance: Secondary | ICD-10-CM | POA: Diagnosis not present

## 2014-04-03 DIAGNOSIS — Z79899 Other long term (current) drug therapy: Secondary | ICD-10-CM | POA: Diagnosis not present

## 2014-04-03 DIAGNOSIS — E118 Type 2 diabetes mellitus with unspecified complications: Secondary | ICD-10-CM | POA: Diagnosis not present

## 2014-04-03 DIAGNOSIS — F03918 Unspecified dementia, unspecified severity, with other behavioral disturbance: Secondary | ICD-10-CM

## 2014-04-03 LAB — POCT GLYCOSYLATED HEMOGLOBIN (HGB A1C)

## 2014-04-03 NOTE — Progress Notes (Signed)
Subjective:    Patient ID: [name], male    DOB: 07/27/1931, 81 y.o.   MRN: [id number]  HPI  81 year old male comes in today accompanied by his daughter-in-law. He has dementia and difficulty sleeping at night. His family has noticed an increase in agitation and difficulty sleeping. He also complains of wheezing and cough. He is exhibiting more and more signs and symptoms of probable Alzheimer's. Family is committed to taking care of him in the home although it is  becoming more difficult. He gives money away to strangers is very argumentative and abusive to the family. He still drives and has had times where he's got lost but has been in no accidents yet  there is no history of dysphagia falls or  behaviors  Patient Active Problem List   Diagnosis Date Noted  . Dementia with behavioral disturbance 11/15/2013  . Cough 11/15/2013  . Benign paroxysmal positional vertigo 01/30/2013  . Adjustment reaction of adult life 01/30/2013  . Need for prophylactic vaccination and inoculation against influenza 12/09/2012  . Calcaneal fracture 09/09/2012  . Seizure 05/14/2011  . Hypotension, unspecified 05/14/2011  . Preop cardiovascular exam 03/17/2011  . Diabetes mellitus   . CAD (coronary artery disease) of artery bypass graft   . Hyperlipidemia   . Hypertension   . Peripheral vascular disease   . Occlusion and stenosis of carotid artery without mention of cerebral infarction 02/11/2011   Outpatient Encounter Prescriptions as of 04/03/2014  Medication Sig  . ACCU-CHEK AVIVA PLUS test strip TEST TWICE A DAY  . albuterol (PROVENTIL) (2.5 MG/3ML) 0.083% nebulizer solution Take 2.5 mg by nebulization every 6 (six) hours as needed for wheezing or shortness of breath.  Marland Kitchen aspirin EC 81 MG EC tablet Take 1 tablet (81 mg total) by mouth 2 (two) times daily.  . Calcium Carb-Cholecalciferol (CALCIUM 600+D3) 600-800 MG-UNIT TABS Take 1 tablet by mouth 2 (two) times daily.  . citalopram (CELEXA)  20 MG tablet Take 1 tablet (20 mg total) by mouth daily.  . clopidogrel (PLAVIX) 75 MG tablet Take 1 tablet (75 mg total) by mouth daily.  Marland Kitchen donepezil (ARICEPT) 10 MG tablet Take 1 tablet (10 mg total) by mouth at bedtime.  . fish oil-omega-3 fatty acids 1000 MG capsule Take 1 g by mouth every morning. Contains Omega 3, 6, and 9  . glyBURIDE (DIABETA) 2.5 MG tablet Take 1 tablet (2.5 mg total) by mouth 2 (two) times daily.  Marland Kitchen LORazepam (ATIVAN) 0.5 MG tablet TAKE ONE TABLET BY MOUTH TWICE DAILY AS NEEDED FOR ANXIETY  . metoprolol tartrate (LOPRESSOR) 25 MG tablet Take 1 tablet (25 mg total) by mouth 2 (two) times daily.  . Multiple Vitamin (MULITIVITAMIN WITH MINERALS) TABS Take 1 tablet by mouth daily.  . niacin (NIASPAN) 1000 MG CR tablet Take 1 tablet (1,000 mg total) by mouth at bedtime.  Marland Kitchen QUEtiapine (SEROQUEL) 50 MG tablet Take one tablet by mouth in AM and two tablets at bedtime  . ramipril (ALTACE) 2.5 MG capsule TAKE 1 CAPSULE DAILY  . simvastatin (ZOCOR) 20 MG tablet TAKE 1 TABLET AT BEDTIME  . [DISCONTINUED] alendronate (FOSAMAX) 35 MG tablet Take 1 tablet (35 mg total) by mouth every 7 (seven) days. Take with a full glass of water on an empty stomach.  . [DISCONTINUED] Tamsulosin HCl (FLOMAX) 0.4 MG CAPS Take 0.4 mg by mouth daily.     Review of Systems  Constitutional: Positive for unexpected weight change.  Has gained 17 pounds since his last visit  Respiratory: Positive for shortness of breath and wheezing.   Cardiovascular: Negative.   Gastrointestinal: Negative.   Genitourinary: Positive for urgency.  Psychiatric/Behavioral: Positive for behavioral problems, confusion and sleep disturbance. The patient is nervous/anxious.        Objective:   Physical Exam  Constitutional: He is oriented to person, place, and time. He appears well-developed and well-nourished.  HENT:  Head: Normocephalic.  Right Ear: External ear normal.  Left Ear: External ear normal.  Nose:  Nose normal.  Mouth/Throat: Oropharynx is clear and moist.  Eyes: Conjunctivae and EOM are normal. Pupils are equal, round, and reactive to light.  Neck: Normal range of motion. Neck supple.  Cardiovascular: Normal rate, regular rhythm, normal heart sounds and intact distal pulses.   Pulmonary/Chest: Effort normal and breath sounds normal.  Abdominal: Soft. Bowel sounds are normal.  Musculoskeletal: Normal range of motion.  Neurological: He is alert and oriented to person, place, and time.  Skin: Skin is warm and dry.  Psychiatric: He has a normal mood and affect. His behavior is normal. Judgment and thought content normal.  e is able to carry on conversation although is evident. He does have some memory losses he cannot remember the name of a ran child in the room today. He attributes any memory loss to being 82. If the family were not present it might be hard to say that this man has dementia but as I listen to the family there is no doubt that it is present    BP 162/85 mmHg  Pulse 109  Temp(Src) 97.3 F (36.3 C) (Oral)  Ht _0  (1.803 m)  Wt 223 lb (101.152 kg)  BMI 31.12 kg/m2       Assessment & Plan:  1. Dementia with behavioral disturbance We'll try to adjust meds: Discontinue Aricept and add Namenda tapering up to 28 mg a day. Have also recommended the family to wake him in the daytime in the hopes that he'll sleep better at night. Did stop several nonessential medicines given the  dementia  2. High risk medication use Since he is on Seroquel will check LFTs and A1c. Did recommend increasing Seroquel to 2 tablets in the morning and 2 in the evening - CMP14+EGFR  3. Type 2 diabetes mellitus with complication Z7H is 7.2 today and has only gone up a little bit since last checked at 7.0. I attribute that to his dietary indiscretions and weight gain  Wardell Honour MD - POCT glycosylated hemoglobin (Hb A1C)

## 2014-04-04 LAB — CMP14+EGFR
ALBUMIN: 4.1 g/dL (ref 3.5–4.7)
ALT: 17 IU/L (ref 0–44)
AST: 17 IU/L (ref 0–40)
Albumin/Globulin Ratio: 1.3 (ref 1.1–2.5)
Alkaline Phosphatase: 55 IU/L (ref 39–117)
BUN/Creatinine Ratio: 13 (ref 10–22)
BUN: 15 mg/dL (ref 8–27)
CO2: 28 mmol/L (ref 18–29)
Calcium: 8.9 mg/dL (ref 8.6–10.2)
Chloride: 97 mmol/L (ref 97–108)
Creatinine, Ser: 1.17 mg/dL (ref 0.76–1.27)
GFR calc Af Amer: 68 mL/min/{1.73_m2} (ref >59)
GFR calc non Af Amer: 59 mL/min/{1.73_m2} — ABNORMAL LOW (ref >59)
GLOBULIN, TOTAL: 3.2 g/dL (ref 1.5–4.5)
Glucose: 329 mg/dL — ABNORMAL HIGH (ref 65–99)
POTASSIUM: 4.5 mmol/L (ref 3.5–5.2)
Sodium: 139 mmol/L (ref 134–144)
Total Bilirubin: 0.2 mg/dL (ref 0.0–1.2)
Total Protein: 7.3 g/dL (ref 6.0–8.5)

## 2014-04-10 ENCOUNTER — Emergency Department (HOSPITAL_COMMUNITY): Payer: Medicare Other

## 2014-04-10 ENCOUNTER — Encounter (HOSPITAL_COMMUNITY): Payer: Self-pay | Admitting: Emergency Medicine

## 2014-04-10 ENCOUNTER — Inpatient Hospital Stay (HOSPITAL_COMMUNITY)
Admission: EM | Admit: 2014-04-10 | Discharge: 2014-04-12 | DRG: 191 | Disposition: A | Discharge status: Home / Self Care | Payer: Medicare Other | Attending: Internal Medicine | Admitting: Internal Medicine

## 2014-04-10 DIAGNOSIS — M1990 Unspecified osteoarthritis, unspecified site: Secondary | ICD-10-CM | POA: Diagnosis present

## 2014-04-10 DIAGNOSIS — F0391 Unspecified dementia with behavioral disturbance: Secondary | ICD-10-CM | POA: Diagnosis present

## 2014-04-10 DIAGNOSIS — I6522 Occlusion and stenosis of left carotid artery: Secondary | ICD-10-CM

## 2014-04-10 DIAGNOSIS — J9801 Acute bronchospasm: Secondary | ICD-10-CM | POA: Diagnosis present

## 2014-04-10 DIAGNOSIS — J441 Chronic obstructive pulmonary disease with (acute) exacerbation: Secondary | ICD-10-CM | POA: Diagnosis not present

## 2014-04-10 DIAGNOSIS — Z87891 Personal history of nicotine dependence: Secondary | ICD-10-CM

## 2014-04-10 DIAGNOSIS — Z7982 Long term (current) use of aspirin: Secondary | ICD-10-CM

## 2014-04-10 DIAGNOSIS — F03918 Unspecified dementia, unspecified severity, with other behavioral disturbance: Secondary | ICD-10-CM | POA: Diagnosis present

## 2014-04-10 DIAGNOSIS — R053 Chronic cough: Secondary | ICD-10-CM | POA: Diagnosis present

## 2014-04-10 DIAGNOSIS — Z951 Presence of aortocoronary bypass graft: Secondary | ICD-10-CM

## 2014-04-10 DIAGNOSIS — Z833 Family history of diabetes mellitus: Secondary | ICD-10-CM

## 2014-04-10 DIAGNOSIS — K219 Gastro-esophageal reflux disease without esophagitis: Secondary | ICD-10-CM | POA: Diagnosis present

## 2014-04-10 DIAGNOSIS — E119 Type 2 diabetes mellitus without complications: Secondary | ICD-10-CM

## 2014-04-10 DIAGNOSIS — I10 Essential (primary) hypertension: Secondary | ICD-10-CM | POA: Diagnosis present

## 2014-04-10 DIAGNOSIS — R0600 Dyspnea, unspecified: Secondary | ICD-10-CM | POA: Diagnosis present

## 2014-04-10 DIAGNOSIS — E089 Diabetes mellitus due to underlying condition without complications: Secondary | ICD-10-CM

## 2014-04-10 DIAGNOSIS — Z822 Family history of deafness and hearing loss: Secondary | ICD-10-CM

## 2014-04-10 DIAGNOSIS — I6529 Occlusion and stenosis of unspecified carotid artery: Secondary | ICD-10-CM | POA: Diagnosis present

## 2014-04-10 DIAGNOSIS — I2510 Atherosclerotic heart disease of native coronary artery without angina pectoris: Secondary | ICD-10-CM | POA: Diagnosis present

## 2014-04-10 DIAGNOSIS — R0602 Shortness of breath: Secondary | ICD-10-CM | POA: Diagnosis not present

## 2014-04-10 DIAGNOSIS — R569 Unspecified convulsions: Secondary | ICD-10-CM

## 2014-04-10 DIAGNOSIS — E785 Hyperlipidemia, unspecified: Secondary | ICD-10-CM | POA: Diagnosis present

## 2014-04-10 DIAGNOSIS — I639 Cerebral infarction, unspecified: Secondary | ICD-10-CM

## 2014-04-10 DIAGNOSIS — I739 Peripheral vascular disease, unspecified: Secondary | ICD-10-CM | POA: Diagnosis present

## 2014-04-10 DIAGNOSIS — Z7902 Long term (current) use of antithrombotics/antiplatelets: Secondary | ICD-10-CM

## 2014-04-10 DIAGNOSIS — D509 Iron deficiency anemia, unspecified: Secondary | ICD-10-CM | POA: Diagnosis present

## 2014-04-10 DIAGNOSIS — D649 Anemia, unspecified: Secondary | ICD-10-CM

## 2014-04-10 DIAGNOSIS — R079 Chest pain, unspecified: Secondary | ICD-10-CM | POA: Diagnosis present

## 2014-04-10 DIAGNOSIS — R05 Cough: Secondary | ICD-10-CM | POA: Diagnosis present

## 2014-04-10 DIAGNOSIS — I25810 Atherosclerosis of coronary artery bypass graft(s) without angina pectoris: Secondary | ICD-10-CM | POA: Diagnosis present

## 2014-04-10 DIAGNOSIS — Z8673 Personal history of transient ischemic attack (TIA), and cerebral infarction without residual deficits: Secondary | ICD-10-CM

## 2014-04-10 LAB — COMPREHENSIVE METABOLIC PANEL
ALT: 22 U/L (ref 0–53)
AST: 19 U/L (ref 0–37)
Albumin: 3.8 g/dL (ref 3.5–5.2)
Alkaline Phosphatase: 46 U/L (ref 39–117)
Anion gap: 4 — ABNORMAL LOW (ref 5–15)
BUN: 22 mg/dL (ref 6–23)
CALCIUM: 8.6 mg/dL (ref 8.4–10.5)
CO2: 29 mmol/L (ref 19–32)
Chloride: 107 mmol/L (ref 96–112)
Creatinine, Ser: 1.1 mg/dL (ref 0.50–1.35)
GFR calc non Af Amer: 61 mL/min — ABNORMAL LOW (ref >90)
GFR, EST AFRICAN AMERICAN: 71 mL/min — AB (ref >90)
Glucose, Bld: 121 mg/dL — ABNORMAL HIGH (ref 70–99)
POTASSIUM: 4 mmol/L (ref 3.5–5.1)
SODIUM: 140 mmol/L (ref 135–145)
TOTAL PROTEIN: 7.5 g/dL (ref 6.0–8.3)
Total Bilirubin: 0.3 mg/dL (ref 0.3–1.2)

## 2014-04-10 LAB — BRAIN NATRIURETIC PEPTIDE: B Natriuretic Peptide: 21 pg/mL (ref 0.0–100.0)

## 2014-04-10 LAB — CBC WITH DIFFERENTIAL/PLATELET
Basophils Absolute: 0.1 10*3/uL (ref 0.0–0.1)
Basophils Relative: 1 % (ref 0–1)
Eosinophils Absolute: 0.4 10*3/uL (ref 0.0–0.7)
Eosinophils Relative: 4 % (ref 0–5)
HEMATOCRIT: 37.3 % — AB (ref 39.0–52.0)
Hemoglobin: 12.5 g/dL — ABNORMAL LOW (ref 13.0–17.0)
LYMPHS ABS: 1.9 10*3/uL (ref 0.7–4.0)
LYMPHS PCT: 23 % (ref 12–46)
MCH: 33.1 pg (ref 26.0–34.0)
MCHC: 33.5 g/dL (ref 30.0–36.0)
MCV: 98.7 fL (ref 78.0–100.0)
MONOS PCT: 6 % (ref 3–12)
Monocytes Absolute: 0.5 10*3/uL (ref 0.1–1.0)
Neutro Abs: 5.6 10*3/uL (ref 1.7–7.7)
Neutrophils Relative %: 66 % (ref 43–77)
PLATELETS: 187 10*3/uL (ref 150–400)
RBC: 3.78 MIL/uL — ABNORMAL LOW (ref 4.22–5.81)
RDW: 13.8 % (ref 11.5–15.5)
WBC: 8.5 10*3/uL (ref 4.0–10.5)

## 2014-04-10 LAB — TROPONIN I: Troponin I: <0.03 ng/mL (ref <0.031)

## 2014-04-10 LAB — D-DIMER, QUANTITATIVE: D-Dimer, Quant: 3.72 ug/mL-FEU — ABNORMAL HIGH (ref 0.00–0.48)

## 2014-04-10 MED ORDER — INSULIN ASPART 100 UNIT/ML ~~LOC~~ SOLN
0.0000 [IU] | Freq: Three times a day (TID) | SUBCUTANEOUS | Status: DC
  Administered 2014-04-11: 5 [IU] via SUBCUTANEOUS

## 2014-04-10 MED ORDER — PREDNISONE 50 MG PO TABS
60.0000 mg | ORAL_TABLET | Freq: Once | ORAL | Status: AC
  Administered 2014-04-10: 60 mg via ORAL
  Filled 2014-04-10 (×2): qty 1

## 2014-04-10 MED ORDER — IOHEXOL 350 MG/ML SOLN
100.0000 mL | Freq: Once | INTRAVENOUS | Status: AC | PRN
  Administered 2014-04-10: 100 mL via INTRAVENOUS

## 2014-04-10 MED ORDER — NITROGLYCERIN 2 % TD OINT
0.5000 [in_us] | TOPICAL_OINTMENT | Freq: Four times a day (QID) | TRANSDERMAL | Status: DC
  Administered 2014-04-11 (×3): 0.5 [in_us] via TOPICAL
  Filled 2014-04-10 (×3): qty 1

## 2014-04-10 MED ORDER — QUETIAPINE FUMARATE 100 MG PO TABS
50.0000 mg | ORAL_TABLET | Freq: Two times a day (BID) | ORAL | Status: DC
Start: 2014-04-11 — End: 2014-04-11
  Filled 2014-04-10: qty 1

## 2014-04-10 MED ORDER — ALBUTEROL SULFATE (2.5 MG/3ML) 0.083% IN NEBU
5.0000 mg | INHALATION_SOLUTION | Freq: Once | RESPIRATORY_TRACT | Status: AC
  Administered 2014-04-10: 5 mg via RESPIRATORY_TRACT
  Filled 2014-04-10: qty 6

## 2014-04-10 MED ORDER — IPRATROPIUM-ALBUTEROL 0.5-2.5 (3) MG/3ML IN SOLN
3.0000 mL | RESPIRATORY_TRACT | Status: DC
  Administered 2014-04-11 (×3): 3 mL via RESPIRATORY_TRACT
  Filled 2014-04-10 (×3): qty 3

## 2014-04-10 MED ORDER — INSULIN ASPART 100 UNIT/ML ~~LOC~~ SOLN: 0.0000 [IU] | Freq: Every day | SUBCUTANEOUS | Status: DC

## 2014-04-10 NOTE — ED Notes (Signed)
Pt was seen at urgent care for SOB, wheezing. Was dx with bronchitis and sent home with Rx. Pt later today had difficulty breathing and chest pain. "I can't breath deep. It don't work".

## 2014-04-10 NOTE — ED Provider Notes (Signed)
CSN: [id number]     Arrival date & time 04/10/14  2030 History  This chart was scribed for [name] Quarry, MD by Richarda Overlie, ED Scribe. This patient was seen in room APA07/APA07 and the patient's care was started 8:59 PM.    Chief Complaint  Patient presents with  . Chest Pain  . Shortness of Breath   Patient is a 81 y.o. male presenting with chest pain and shortness of breath. The history is provided by the patient.  Chest Pain Associated symptoms: cough, fever and shortness of breath   Shortness of Breath Severity:  Moderate Duration:  6 hours Timing:  Intermittent Progression:  Unable to specify Relieved by:  Nothing Ineffective treatments:  Inhaler Associated symptoms: chest pain, cough, fever and wheezing    HPI Comments: [name] is a 81 y.o. male with a history of DM, GERD, hyperlipidemia and CABG who presents to the Emergency Department complaining of worsening SOB that started at [facility]. Pt reports associated intermittent CP, coughing, gagging and subjective fever. He says that deep breathing worsens his CP. Pt was dx with bronchitis and sent home with Rx this afternoon. He states that he has not taken any of the medication PTA. Pt used a breathing treatment earlier this morning which failed to relieve his symptoms. Pt says he has been coughing since last October. He reports no history of blood clots in the legs or lungs.    Past Medical History  Diagnosis Date  . Diabetes mellitus type 2   . GERD (gastroesophageal reflux disease)   . Hyperlipidemia     takes Niacin nightly  . DJD (degenerative joint disease)   . Hypertension     takes Metoprolol and Ramipril daily  . Dizziness   . Gout     hx of--66yrs ago  . Bruises easily     pt is on Plavix and ASA  . Shortness of breath     wakes pt up during night with occ shortness of breathe;states it happens about once a month  . Impaired hearing   . Urinary frequency   . UTI (lower urinary tract infection)      hx of--03/2010  . Anemia     takes Glyburide bid  . CAD (coronary artery disease) of artery bypass graft   . Peripheral vascular disease   . Pneumonia     "at least twice that I know of"  . Seizures 05/14/11    "first time was today"  . CVA (cerebral vascular accident) ~ 2009    right arm/leg occ hurts  . Carotid artery occlusion    Past Surgical History  Procedure Laterality Date  . Appendectomy  1968  . Colonoscopy    . Endarterectomy  04/09/2011    Procedure: ENDARTERECTOMY CAROTID;  Surgeon: Chuck Hint, MD;  Location: [facility] OR;  Service: Vascular;  Laterality: Left;  Left Carotid endarterectomy With Dacron Patch Angioplasty  . Cardiac catheterization  1998    dr hochrin  . Endarterectomy  05/08/2011    Procedure: ENDARTERECTOMY CAROTID;  Surgeon: Chuck Hint, MD;  Location: [facility] OR;  Service: Vascular;  Laterality: Right;  with [place] Patch Angioplasty  . Cataract extraction w/ intraocular lens  implant, bilateral  2012  . Coronary artery bypass graft  1998    LIMA to LAD, SVG to diagonal, SVG to PDA, SVG to posterior lateral.   . Eye surgery  1953    right ; "piece of metal removed"  . Eye surgery  2012    Cataract- Bilateral  . Carotid endarterectomy     Family History  Problem Relation Age of Onset  . Diabetes Sister   . Cancer Brother   . Diabetes Brother   . Anesthesia problems Neg Hx   . Hypotension Neg Hx   . Malignant hyperthermia Neg Hx   . Pseudochol deficiency Neg Hx   . Hearing loss Father    History  Substance Use Topics  . Smoking status: Former Smoker -- 1.00 packs/day for 20 years    Types: Cigarettes    Quit date: 03/02/1974  . Smokeless tobacco: Never Used  . Alcohol Use: No     Comment: quit in 1981    Review of Systems  Constitutional: Positive for fever.  Respiratory: Positive for cough, shortness of breath and wheezing.   Cardiovascular: Positive for chest pain.  All other systems reviewed and are  negative.     Allergies  Fexofenadine hcl; Flonase; Nasal spray; and Nasal spray  Home Medications   Prior to Admission medications   Medication Sig Start Date End Date Taking? Authorizing Provider  ACCU-CHEK AVIVA PLUS test strip TEST TWICE A DAY 03/16/13   Ileana Ladd, MD  albuterol (PROVENTIL) (2.5 MG/3ML) 0.083% nebulizer solution Take 2.5 mg by nebulization every 6 (six) hours as needed for wheezing or shortness of breath.    Historical Provider, MD  aspirin EC 81 MG EC tablet Take 1 tablet (81 mg total) by mouth 2 (two) times daily. 03/21/12   Rollene Rotunda, MD  Calcium Carb-Cholecalciferol (CALCIUM 600+D3) 600-800 MG-UNIT TABS Take 1 tablet by mouth 2 (two) times daily. 01/11/14   Frederica Kuster, MD  citalopram (CELEXA) 20 MG tablet Take 1 tablet (20 mg total) by mouth daily. 01/12/14   Frederica Kuster, MD  clopidogrel (PLAVIX) 75 MG tablet Take 1 tablet (75 mg total) by mouth daily. 12/21/13   Rollene Rotunda, MD  donepezil (ARICEPT) 10 MG tablet Take 1 tablet (10 mg total) by mouth at bedtime. 10/30/13   Frederica Kuster, MD  fish oil-omega-3 fatty acids 1000 MG capsule Take 1 g by mouth every morning. Contains Omega 3, 6, and 9    Historical Provider, MD  glyBURIDE (DIABETA) 2.5 MG tablet Take 1 tablet (2.5 mg total) by mouth 2 (two) times daily. 03/01/13   Ileana Ladd, MD  LORazepam (ATIVAN) 0.5 MG tablet TAKE ONE TABLET BY MOUTH TWICE DAILY AS NEEDED FOR ANXIETY 03/05/14   Frederica Kuster, MD  metoprolol tartrate (LOPRESSOR) 25 MG tablet Take 1 tablet (25 mg total) by mouth 2 (two) times daily. 10/17/13   Rollene Rotunda, MD  Multiple Vitamin (MULITIVITAMIN WITH MINERALS) TABS Take 1 tablet by mouth daily.    Historical Provider, MD  niacin (NIASPAN) 1000 MG CR tablet Take 1 tablet (1,000 mg total) by mouth at bedtime. 03/01/13   Ileana Ladd, MD  QUEtiapine (SEROQUEL) 50 MG tablet Take one tablet by mouth in AM and two tablets at bedtime 02/08/14   Frederica Kuster, MD   ramipril (ALTACE) 2.5 MG capsule TAKE 1 CAPSULE DAILY 12/21/13   Rollene Rotunda, MD  simvastatin (ZOCOR) 20 MG tablet TAKE 1 TABLET AT BEDTIME 03/05/14   Rollene Rotunda, MD   BP 167/94 mmHg  Pulse 103  Temp(Src) 98.2 F (36.8 C) (Oral)  Resp 20  Ht  (1.803 m)  Wt 209 lb (94.802 kg)  BMI 29.16 kg/m2  SpO2 99% Physical Exam  Constitutional: He is oriented  to person, place, and time. He appears well-developed and well-nourished.  HENT:  Head: Normocephalic and atraumatic.  Eyes: Right eye exhibits no discharge. Left eye exhibits no discharge.  Neck: Neck supple. No tracheal deviation present.  Cardiovascular: Normal rate.   Pulmonary/Chest: Effort normal. He has wheezes. He has rhonchi.  Abdominal: He exhibits no distension.  Neurological: He is alert and oriented to person, place, and time.  Skin: Skin is warm and dry.  Psychiatric: He has a normal mood and affect.  Nursing note and vitals reviewed.   ED Course  Procedures   DIAGNOSTIC STUDIES: Oxygen Saturation is 99% on RA, normal by my interpretation.    COORDINATION OF CARE: 9:08 PM Discussed treatment plan with pt at bedside and pt agreed to plan.   Labs Review Labs Reviewed  CBC WITH DIFFERENTIAL/PLATELET - Abnormal; Notable for the following:    RBC 3.78 (*)    Hemoglobin 12.5 (*)    HCT 37.3 (*)    All other components within normal limits  COMPREHENSIVE METABOLIC PANEL - Abnormal; Notable for the following:    Glucose, Bld 121 (*)    GFR calc non Af Amer 61 (*)    GFR calc Af Amer 71 (*)    Anion gap 4 (*)    All other components within normal limits  D-DIMER, QUANTITATIVE - Abnormal; Notable for the following:    D-Dimer, Quant 3.72 (*)    All other components within normal limits  TROPONIN I  BRAIN NATRIURETIC PEPTIDE    Imaging Review Ct Angio Chest Pe W/cm &/or Wo Cm  04/10/2014   CLINICAL DATA:  Dyspnea since 1500 hr. Chest pain, cough, gagging and fever.  EXAM: CT ANGIOGRAPHY CHEST WITH  CONTRAST  TECHNIQUE: Multidetector CT imaging of the chest was performed using the standard protocol during bolus administration of intravenous contrast. Multiplanar CT image reconstructions and MIPs were obtained to evaluate the vascular anatomy.  CONTRAST:  OMNIPAQUE IOHEXOL 350 MG/ML SOLN  COMPARISON:  None.  FINDINGS: Technically adequate study with moderately good opacification of the central and segmental pulmonary arteries. No focal filling defects demonstrated. No evidence of significant pulmonary embolus.  Postoperative changes in the mediastinum consistent with previous bypass graft. Normal heart size. Normal caliber thoracic aorta with calcification and atherosclerotic changes. Great vessel origins are patent. No significant lymphadenopathy in the chest. Esophagus is decompressed. Small esophageal hiatal hernia.  Emphysematous changes in the lungs. No focal airspace disease or consolidation. No pneumothorax. No pleural effusions. Calcified granuloma in the right lung. Airways appear patent.  Included portions of the upper abdominal organs demonstrate fatty infiltration in the liver. Degenerative changes in the spine.  Review of the MIP images confirms the above findings.  IMPRESSION: No evidence of significant pulmonary embolus. No evidence of active pulmonary disease.   Electronically Signed   By: Burman Nieves M.D.   On: 04/10/2014 22:31   Dg Chest Portable 1 View  04/10/2014   CLINICAL DATA:  Shortness of breath, wheezing, chest pain, cough since October  EXAM: PORTABLE CHEST - 1 VIEW  COMPARISON:  Earlier films of the same day  FINDINGS: Previous CABG. Heart size upper limits normal. Lungs are clear. No pneumothorax. No effusion.  IMPRESSION: No acute disease post CABG.   Electronically Signed   By: Corlis Leak M.D.   On: 04/10/2014 21:32     EKG Interpretation   Date/Time:  Tuesday April 10 2014 20:34:08 EST Ventricular Rate:  103 PR Interval:  170 QRS Duration:  88 QT Interval:   344 QTC Calculation: 450 R Axis:   19 Text Interpretation:  Sinus tachycardia with occasional Premature  ventricular complexes Nonspecific ST abnormality Abnormal ECG No  significant change since last tracing Confirmed by Sivan Quast MD, Duwayne HeckANIELLE  [id number]) on 04/10/2014 8:55:33 PM      MDM   Final diagnoses:  Dyspnea  Chest pain, unspecified chest pain type   81 year old male with dementia presents complaining of worsening shortness of breath and states he was recently diagnosed with bronchitis. He continues to feel dyspneic. He also some complains of some chest pain. Is very hard to get specifics about chest pain. He has a history of CABG. Workup here appears with elevated d-dimer but CT angiogram shows no evidence of clot. Given the difficulty in obtaining specifics regarding the chest pain from the patient, plan assessment overnight with serial enzymes and treatment for his wheezing with albuterol and prednisone.  Discussed with DR. Lovell SheehanJenkins and plan observation.   [name] Quarryanielle S Verlean Allport, MD 04/10/14 [id number]

## 2014-04-10 NOTE — ED Notes (Signed)
Daughter in law # [phone].

## 2014-04-10 NOTE — H&P (Signed)
[facility] Admission History and Physical       [name] WUJ:[id number] DOB: [birth year] DOA: 04/10/2014  Referring physician: EDP PCP: Frederica Kuster, MD  Specialists:   Chief Complaint: SOB, Chest Pain, Wheezing, and Increased Coughing  HPI: [name] is a 81 y.o. male with a history of CAD S/P CABG, CVA, HTN, DM2, Hyperlipidemia, PVD, Gout and Dementia who resents to the ED with complaints of Worsening SOB, and increased coughing and Wheezing and Chest pain this evening.  He reports his symptoms were worse this evening, and here ports having a severe cough since 11/2013.  He reports in 11/2013 he had bronchial pneumonia and his cough never went away.  He reports that he was diagnosed with bronchitis today at a [facility] in [place] and was placed on Tessalon Perles for cough and Prednisone Rx.   His symptoms continued to worsen so he came to the ED at [facility] this evening.    He reports having increased wheezing and chest pain worse with breathing and coughing, However he report that he has had SSCP off and for a few weeks.   He is a former smoker and quit smoking 35 years ago.     Review of Systems: Constitutional: No Weight Loss, No Weight Gain, Night Sweats, Fevers, Chills, Dizziness, Light Headedness, Fatigue, or Generalized Weakness HEENT: No Headaches, Difficulty Swallowing,Tooth/Dental Problems,Sore Throat,  No Sneezing, Rhinitis, Ear Ache, Nasal Congestion, or Post Nasal Drip,  Cardio-vascular:  No Chest pain, Orthopnea, PND, Edema in Lower Extremities, Anasarca, Dizziness, Palpitations  Resp: No +Dyspnea, No DOE, No Productive Cough, +Non-Productive Cough, No Hemoptysis, + Wheezing.    GI: No Heartburn, Indigestion, Abdominal Pain, Nausea, Vomiting, Diarrhea, Constipation, Hematemesis, Hematochezia, Melena, Change in Bowel Habits,  Loss of Appetite  GU: No Dysuria, No Change in Color of Urine, No Urgency or Urinary Frequency, No Flank pain.    Musculoskeletal: No Joint Pain or Swelling, No Decreased Range of Motion, No Back Pain.  Neurologic: No Syncope, No Seizures, Muscle Weakness, Paresthesia, Vision Disturbance or Loss, No Diplopia, No Vertigo, No Difficulty Walking,  Skin: No Rash or Lesions. Psych: No Change in Mood or Affect, No Depression or Anxiety, No Memory loss, No Confusion, or Hallucinations   Past Medical History  Diagnosis Date  . Diabetes mellitus type 2   . GERD (gastroesophageal reflux disease)   . Hyperlipidemia     takes Niacin nightly  . DJD (degenerative joint disease)   . Hypertension     takes Metoprolol and Ramipril daily  . Dizziness   . Gout     hx of--16yrs ago  . Bruises easily     pt is on Plavix and ASA  . Shortness of breath     wakes pt up during night with occ shortness of breathe;states it happens about once a month  . Impaired hearing   . Urinary frequency   . UTI (lower urinary tract infection)     hx of--03/2010  . Anemia     takes Glyburide bid  . CAD (coronary artery disease) of artery bypass graft   . Peripheral vascular disease   . Pneumonia     "at least twice that I know of"  . Seizures 05/14/11    "first time was today"  . CVA (cerebral vascular accident) ~ 2009    right arm/leg occ hurts  . Carotid artery occlusion      Past Surgical History  Procedure Laterality Date  . Appendectomy  1968  . Colonoscopy    . Endarterectomy  04/09/2011    Procedure: ENDARTERECTOMY CAROTID;  Surgeon: Chuck Hint, MD;  Location: [facility] OR;  Service: Vascular;  Laterality: Left;  Left Carotid endarterectomy With Dacron Patch Angioplasty  . Cardiac catheterization  1998    dr hochrin  . Endarterectomy  05/08/2011    Procedure: ENDARTERECTOMY CAROTID;  Surgeon: Chuck Hint, MD;  Location: [facility] OR;  Service: Vascular;  Laterality: Right;  with [place] Patch Angioplasty  . Cataract extraction w/ intraocular lens  implant, bilateral  2012  . Coronary artery bypass graft   1998    LIMA to LAD, SVG to diagonal, SVG to PDA, SVG to posterior lateral.   . Eye surgery  1953    right ; "piece of metal removed"  . Eye surgery  2012    Cataract- Bilateral  . Carotid endarterectomy        Prior to Admission medications   Medication Sig Start Date End Date Taking? Authorizing Provider  albuterol (PROVENTIL) (2.5 MG/3ML) 0.083% nebulizer solution Take 2.5 mg by nebulization every 6 (six) hours as needed for wheezing or shortness of breath.   Yes Historical Provider, MD  amoxicillin-clavulanate (AUGMENTIN) 875-125 MG per tablet Take 1 tablet by mouth 2 (two) times daily. 10 day course. HAS NOT STARTED   Yes Historical Provider, MD  aspirin EC 81 MG EC tablet Take 1 tablet (81 mg total) by mouth 2 (two) times daily. 03/21/12  Yes Rollene Rotunda, MD  benzonatate (TESSALON) 100 MG capsule Take 100-200 mg by mouth 3 (three) times daily as needed for cough.   Yes Historical Provider, MD  clopidogrel (PLAVIX) 75 MG tablet Take 1 tablet (75 mg total) by mouth daily. 12/21/13  Yes Rollene Rotunda, MD  LORazepam (ATIVAN) 0.5 MG tablet TAKE ONE TABLET BY MOUTH TWICE DAILY AS NEEDED FOR ANXIETY Patient taking differently: TAKE ONE TABLET BY MOUTH DAILY AS NEEDED FOR ANXIETY 03/05/14  Yes Frederica Kuster, MD  memantine (NAMENDA) 5 MG tablet Take 5 mg by mouth daily.   Yes Historical Provider, MD  metoprolol (LOPRESSOR) 50 MG tablet Take 50 mg by mouth 2 (two) times daily.   Yes Historical Provider, MD  Multiple Vitamin (MULITIVITAMIN WITH MINERALS) TABS Take 1 tablet by mouth daily.   Yes Historical Provider, MD  niacin (NIASPAN) 1000 MG CR tablet Take 1 tablet (1,000 mg total) by mouth at bedtime. 03/01/13  Yes Ileana Ladd, MD  predniSONE (DELTASONE) 20 MG tablet Take 20-60 mg by mouth See admin instructions. TAKE THREE TABLETS DAILY FOR 3 DAYS, THEN TAKE TWO TABLETS DAILY FOR 3 DAYS, THEN TAKE ONE TABLET DAILY FOR 3 DAYS. HAS NOT YET STARTED COURSE   Yes Historical Provider, MD    QUEtiapine (SEROQUEL) 50 MG tablet Take one tablet by mouth in AM and two tablets at bedtime Patient taking differently: Take 50-100 mg by mouth 2 (two) times daily. Take one tablet by mouth in AM and two tablets at bedtime 02/08/14  Yes Frederica Kuster, MD  ramipril (ALTACE) 2.5 MG capsule TAKE 1 CAPSULE DAILY 12/21/13  Yes Rollene Rotunda, MD  ACCU-CHEK AVIVA PLUS test strip TEST TWICE A DAY 03/16/13   Ileana Ladd, MD  Calcium Carb-Cholecalciferol (CALCIUM 600+D3) 600-800 MG-UNIT TABS Take 1 tablet by mouth 2 (two) times daily. Patient not taking: Reported on 04/10/2014 01/11/14   Frederica Kuster, MD  citalopram (CELEXA) 20 MG tablet Take 1 tablet (20 mg total) by mouth daily.  Patient not taking: Reported on 04/10/2014 01/12/14   Frederica Kuster, MD  donepezil (ARICEPT) 10 MG tablet Take 1 tablet (10 mg total) by mouth at bedtime. Patient not taking: Reported on 04/10/2014 10/30/13   Frederica Kuster, MD  glyBURIDE (DIABETA) 2.5 MG tablet Take 1 tablet (2.5 mg total) by mouth 2 (two) times daily. Patient not taking: Reported on 04/10/2014 03/01/13   Ileana Ladd, MD  metoprolol tartrate (LOPRESSOR) 25 MG tablet Take 1 tablet (25 mg total) by mouth 2 (two) times daily. Patient not taking: Reported on 04/10/2014 10/17/13   Rollene Rotunda, MD  simvastatin (ZOCOR) 20 MG tablet TAKE 1 TABLET AT BEDTIME Patient not taking: Reported on 04/10/2014 03/05/14   Rollene Rotunda, MD     Allergies  Allergen Reactions  . Fexofenadine Hcl Other (See Comments)    Heart attack symptoms  . Flonase [Fluticasone Propionate] Other (See Comments)    Heart attack symptoms  . Nasal Spray Other (See Comments)    Heart attack symptoms   . Nasal Spray     CAN NOT TOLERATE ANY NASAL SPRAYS!!!!!  Causes heart attack symptoms    Social History:  reports that he quit smoking about 40 years ago. His smoking use included Cigarettes. He has a 20 pack-year smoking history. He has never used smokeless tobacco. He reports that  he does not drink alcohol or use illicit drugs.    Family History  Problem Relation Age of Onset  . Diabetes Sister   . Cancer Brother   . Diabetes Brother   . Anesthesia problems Neg Hx   . Hypotension Neg Hx   . Malignant hyperthermia Neg Hx   . Pseudochol deficiency Neg Hx   . Hearing loss Father        Physical Exam:  GEN:  Pleasant Elderly Obese  81 y.o. CAucasian male examined and in no acute distress; cooperative with exam Filed Vitals:   04/10/14 2130 04/10/14 2200 04/10/14 2230 04/10/14 2333  BP: 129/72 147/77 143/68   Pulse: 94 103 106   Temp:    98 F (36.7 C)  TempSrc:    Oral  Resp: 21 19 16    Height:      Weight:      SpO2: 100% 97% 97%    Blood pressure 143/68, pulse 106, temperature 98 F (36.7 C), temperature source Oral, resp. rate 16, height 5\' 11"  (1.803 m), weight 94.802 kg (209 lb), SpO2 97 %. PSYCH: He is alert and oriented x4; does not appear anxious does not appear depressed; affect is normal HEENT: Normocephalic and Atraumatic, Mucous membranes pink; PERRLA; EOM intact; Fundi:  Benign;  No scleral icterus, Nares: Patent, Oropharynx: Clear, Fair Dentition,    Neck:  FROM, No Cervical Lymphadenopathy nor Thyromegaly or Carotid Bruit; No JVD; Breasts:: Not examined CHEST WALL:  No tenderness CHEST: Decreased Breath sounds Bilaterally,with Diffuse Wheezes, No Rhonchi or Rales HEART: Regular rate and rhythm; no murmurs rubs or gallops BACK: No kyphosis or scoliosis; No CVA tenderness ABDOMEN: Positive Bowel Sounds, Obese, Soft Non-Tender, No Rebound or Guarding; No Masses, No Organomegaly. Rectal Exam: Not done EXTREMITIES: No Cyanosis, Clubbing, or Edema; No Ulcerations. Genitalia: not examined PULSES: 2+ and symmetric SKIN: Normal hydration no rash or ulceration CNS:  Alert and Oriented x 4, No Focal Deficits Vascular: pulses palpable throughout    Labs on Admission:  Basic Metabolic Panel:  Recent Labs Lab 04/10/14 2103  NA 140  K 4.0    CL 107  CO2  29  GLUCOSE 121*  BUN 22  CREATININE 1.10  CALCIUM 8.6   Liver Function Tests:  Recent Labs Lab 04/10/14 2103  AST 19  ALT 22  ALKPHOS 46  BILITOT 0.3  PROT 7.5  ALBUMIN 3.8   No results for input(s): LIPASE, AMYLASE in the last 168 hours. No results for input(s): AMMONIA in the last 168 hours. CBC:  Recent Labs Lab 04/10/14 2103  WBC 8.5  NEUTROABS 5.6  HGB 12.5*  HCT 37.3*  MCV 98.7  PLT 187   Cardiac Enzymes:  Recent Labs Lab 04/10/14 2103  TROPONINI <0.03    BNP (last 3 results)  Recent Labs  04/10/14 2108  BNP 21.0    ProBNP (last 3 results) No results for input(s): PROBNP in the last 8760 hours.  CBG: No results for input(s): GLUCAP in the last 168 hours.  Radiological Exams on Admission: Ct Angio Chest Pe W/cm &/or Wo Cm  04/10/2014   CLINICAL DATA:  Dyspnea since 1500 hr. Chest pain, cough, gagging and fever.  EXAM: CT ANGIOGRAPHY CHEST WITH CONTRAST  TECHNIQUE: Multidetector CT imaging of the chest was performed using the standard protocol during bolus administration of intravenous contrast. Multiplanar CT image reconstructions and MIPs were obtained to evaluate the vascular anatomy.  CONTRAST:  OMNIPAQUE IOHEXOL 350 MG/ML SOLN  COMPARISON:  None.  FINDINGS: Technically adequate study with moderately good opacification of the central and segmental pulmonary arteries. No focal filling defects demonstrated. No evidence of significant pulmonary embolus.  Postoperative changes in the mediastinum consistent with previous bypass graft. Normal heart size. Normal caliber thoracic aorta with calcification and atherosclerotic changes. Great vessel origins are patent. No significant lymphadenopathy in the chest. Esophagus is decompressed. Small esophageal hiatal hernia.  Emphysematous changes in the lungs. No focal airspace disease or consolidation. No pneumothorax. No pleural effusions. Calcified granuloma in the right lung. Airways appear  patent.  Included portions of the upper abdominal organs demonstrate fatty infiltration in the liver. Degenerative changes in the spine.  Review of the MIP images confirms the above findings.  IMPRESSION: No evidence of significant pulmonary embolus. No evidence of active pulmonary disease.   Electronically Signed   By: Burman Nieves M.D.   On: 04/10/2014 22:31   Dg Chest Portable 1 View  04/10/2014   CLINICAL DATA:  Shortness of breath, wheezing, chest pain, cough since October  EXAM: PORTABLE CHEST - 1 VIEW  COMPARISON:  Earlier films of the same day  FINDINGS: Previous CABG. Heart size upper limits normal. Lungs are clear. No pneumothorax. No effusion.  IMPRESSION: No acute disease post CABG.   Electronically Signed   By: Corlis Leak M.D.   On: 04/10/2014 21:32     EKG: Independently reviewed. Sinus Tachycardia rate 103 with PVC, No Acute Changes   Assessment/Plan:   81 y.o. male with  Principal Problem:   1.    Acute bronchospasm- COPD    DUO Nebs   Steroid Taper   O2 PRN       Active Problems:   2.   Chest pain- Cardiac vs Due to #1,and or #3,   No PE on CTA of the Chest   Telemetry Monitoring   Nitropaste, O2 ASA, Continue Plavix and Metoprolol and Atorvastatin Rx   Cycle Troponins        3.   Chronic cough- Most Likely Due to ACE Inhibitor Rx   Discontinue ACE for Now   Tussionex PRN cough     4.  Diabetes mellitus   Continue Diabeta Rx   SSI coverage PRN   Check HbA1C     5.   CAD (coronary artery disease) of artery bypass graft   Continue Metoprolol, Plavix and  ASA, and Atorvastatin Rx         6.   Hyperlipidemia   Continue Atrorvasatatin Rx     7.    Peripheral vascular disease   Continue Atorvastatin , Plavix and ASA Rx     8.   Dementia with behavioral disturbance   Contniue Namenda Rx     9.   Dyspnea- Due to #1,    O2 PRN   DUONebs   Monitor O2 Sats   10.   Essential hypertension   Continue Metoprolol Rx   11.  Carotid artery  occlusion- Hx of CEA and Stent   Refer for Followup with Vascular as Outpatient   12.  CVA (cerebral vascular accident)- hx   Continue Plavix ASA, and Atorvastatin Rx   13.  Seizures- Hx   No current Rx   14.   Anemia   Send Anemia Panel   15.  DVT Prophylaxis    Lovenox     Code Status:   FULL CODE Family Communication:  [place] Daughter [place]side Disposition Plan:    Observation/Telemetry  Time spent:  60 Minutes  Ron ParkerJENKINS,Maximiliano Cromartie C [facility] Pager [phone]   If 7AM -7PM Please Contact the Day Rounding Team MD for [facility]  If 7PM-7AM, Please Contact Night-Floor Coverage  [email or web address] Password [email or web address] 04/10/2014, 11:54 PM     ADDENDUM:   Patient was seen and examined on 04/10/2014

## 2014-04-11 ENCOUNTER — Encounter (HOSPITAL_COMMUNITY): Payer: Self-pay

## 2014-04-11 DIAGNOSIS — E785 Hyperlipidemia, unspecified: Secondary | ICD-10-CM | POA: Diagnosis present

## 2014-04-11 DIAGNOSIS — D509 Iron deficiency anemia, unspecified: Secondary | ICD-10-CM

## 2014-04-11 DIAGNOSIS — Z822 Family history of deafness and hearing loss: Secondary | ICD-10-CM | POA: Diagnosis not present

## 2014-04-11 DIAGNOSIS — E119 Type 2 diabetes mellitus without complications: Secondary | ICD-10-CM | POA: Diagnosis present

## 2014-04-11 DIAGNOSIS — I2510 Atherosclerotic heart disease of native coronary artery without angina pectoris: Secondary | ICD-10-CM | POA: Diagnosis present

## 2014-04-11 DIAGNOSIS — J441 Chronic obstructive pulmonary disease with (acute) exacerbation: Secondary | ICD-10-CM | POA: Diagnosis present

## 2014-04-11 DIAGNOSIS — M1990 Unspecified osteoarthritis, unspecified site: Secondary | ICD-10-CM | POA: Diagnosis present

## 2014-04-11 DIAGNOSIS — Z951 Presence of aortocoronary bypass graft: Secondary | ICD-10-CM | POA: Diagnosis not present

## 2014-04-11 DIAGNOSIS — I639 Cerebral infarction, unspecified: Secondary | ICD-10-CM

## 2014-04-11 DIAGNOSIS — R569 Unspecified convulsions: Secondary | ICD-10-CM | POA: Diagnosis present

## 2014-04-11 DIAGNOSIS — F0391 Unspecified dementia with behavioral disturbance: Secondary | ICD-10-CM | POA: Diagnosis present

## 2014-04-11 DIAGNOSIS — I6529 Occlusion and stenosis of unspecified carotid artery: Secondary | ICD-10-CM | POA: Diagnosis present

## 2014-04-11 DIAGNOSIS — R0602 Shortness of breath: Secondary | ICD-10-CM | POA: Diagnosis present

## 2014-04-11 DIAGNOSIS — Z833 Family history of diabetes mellitus: Secondary | ICD-10-CM | POA: Diagnosis not present

## 2014-04-11 DIAGNOSIS — Z7902 Long term (current) use of antithrombotics/antiplatelets: Secondary | ICD-10-CM | POA: Diagnosis not present

## 2014-04-11 DIAGNOSIS — K219 Gastro-esophageal reflux disease without esophagitis: Secondary | ICD-10-CM | POA: Diagnosis present

## 2014-04-11 DIAGNOSIS — Z7982 Long term (current) use of aspirin: Secondary | ICD-10-CM | POA: Diagnosis not present

## 2014-04-11 DIAGNOSIS — Z8673 Personal history of transient ischemic attack (TIA), and cerebral infarction without residual deficits: Secondary | ICD-10-CM | POA: Diagnosis not present

## 2014-04-11 DIAGNOSIS — Z87891 Personal history of nicotine dependence: Secondary | ICD-10-CM | POA: Diagnosis not present

## 2014-04-11 DIAGNOSIS — I10 Essential (primary) hypertension: Secondary | ICD-10-CM | POA: Diagnosis present

## 2014-04-11 DIAGNOSIS — I739 Peripheral vascular disease, unspecified: Secondary | ICD-10-CM | POA: Diagnosis present

## 2014-04-11 DIAGNOSIS — J9801 Acute bronchospasm: Secondary | ICD-10-CM | POA: Diagnosis present

## 2014-04-11 DIAGNOSIS — R079 Chest pain, unspecified: Secondary | ICD-10-CM | POA: Diagnosis present

## 2014-04-11 LAB — FOLATE: Folate: >20 ng/mL

## 2014-04-11 LAB — CBC
HEMATOCRIT: 35.5 % — AB (ref 39.0–52.0)
HEMOGLOBIN: 11.4 g/dL — AB (ref 13.0–17.0)
MCH: 31.7 pg (ref 26.0–34.0)
MCHC: 32.1 g/dL (ref 30.0–36.0)
MCV: 98.6 fL (ref 78.0–100.0)
Platelets: 167 10*3/uL (ref 150–400)
RBC: 3.6 MIL/uL — AB (ref 4.22–5.81)
RDW: 13.9 % (ref 11.5–15.5)
WBC: 6.8 10*3/uL (ref 4.0–10.5)

## 2014-04-11 LAB — IRON AND TIBC
Iron: 19 ug/dL — ABNORMAL LOW (ref 42–165)
Saturation Ratios: 8 % — ABNORMAL LOW (ref 20–55)
TIBC: 225 ug/dL (ref 215–435)
UIBC: 206 ug/dL (ref 125–400)

## 2014-04-11 LAB — GLUCOSE, CAPILLARY
GLUCOSE-CAPILLARY: 263 mg/dL — AB (ref 70–99)
GLUCOSE-CAPILLARY: 164 mg/dL — AB (ref 70–99)
GLUCOSE-CAPILLARY: 211 mg/dL — AB (ref 70–99)
Glucose-Capillary: 310 mg/dL — ABNORMAL HIGH (ref 70–99)

## 2014-04-11 LAB — BASIC METABOLIC PANEL
Anion gap: 11 (ref 5–15)
BUN: 20 mg/dL (ref 6–23)
CHLORIDE: 103 mmol/L (ref 96–112)
CO2: 22 mmol/L (ref 19–32)
Calcium: 8.3 mg/dL — ABNORMAL LOW (ref 8.4–10.5)
Creatinine, Ser: 1.11 mg/dL (ref 0.50–1.35)
GFR calc non Af Amer: 61 mL/min — ABNORMAL LOW (ref >90)
GFR, EST AFRICAN AMERICAN: 70 mL/min — AB (ref >90)
Glucose, Bld: 314 mg/dL — ABNORMAL HIGH (ref 70–99)
POTASSIUM: 4.1 mmol/L (ref 3.5–5.1)
Sodium: 136 mmol/L (ref 135–145)

## 2014-04-11 LAB — RETICULOCYTES
RBC.: 3.6 MIL/uL — AB (ref 4.22–5.81)
Retic Count, Absolute: 57.6 10*3/uL (ref 19.0–186.0)
Retic Ct Pct: 1.6 % (ref 0.4–3.1)

## 2014-04-11 LAB — FERRITIN: Ferritin: 105 ng/mL (ref 22–322)

## 2014-04-11 LAB — TROPONIN I
Troponin I: <0.03 ng/mL (ref <0.031)
Troponin I: <0.03 ng/mL (ref <0.031)

## 2014-04-11 LAB — MAGNESIUM: Magnesium: 1.6 mg/dL (ref 1.5–2.5)

## 2014-04-11 LAB — VITAMIN B12: VITAMIN B 12: 667 pg/mL (ref 211–911)

## 2014-04-11 MED ORDER — OXYCODONE HCL 5 MG PO TABS: 5.0000 mg | ORAL_TABLET | ORAL | Status: DC | PRN

## 2014-04-11 MED ORDER — ACETAMINOPHEN 650 MG RE SUPP: 650.0000 mg | Freq: Four times a day (QID) | RECTAL | Status: DC | PRN

## 2014-04-11 MED ORDER — METHYLPREDNISOLONE SODIUM SUCC 125 MG IJ SOLR
80.0000 mg | Freq: Three times a day (TID) | INTRAMUSCULAR | Status: DC
  Administered 2014-04-11 – 2014-04-12 (×4): 80 mg via INTRAVENOUS
  Filled 2014-04-11 (×4): qty 2

## 2014-04-11 MED ORDER — ADULT MULTIVITAMIN W/MINERALS CH
1.0000 | ORAL_TABLET | Freq: Every day | ORAL | Status: DC
  Administered 2014-04-11 – 2014-04-12 (×2): 1 via ORAL
  Filled 2014-04-11 (×2): qty 1

## 2014-04-11 MED ORDER — QUETIAPINE FUMARATE 100 MG PO TABS
100.0000 mg | ORAL_TABLET | Freq: Every day | ORAL | Status: DC
  Administered 2014-04-11 (×2): 100 mg via ORAL
  Filled 2014-04-11: qty 1

## 2014-04-11 MED ORDER — IPRATROPIUM BROMIDE 0.02 % IN SOLN: 0.5000 mg | RESPIRATORY_TRACT | Status: DC | PRN

## 2014-04-11 MED ORDER — ENOXAPARIN SODIUM 30 MG/0.3ML ~~LOC~~ SOLN: 30.0000 mg | SUBCUTANEOUS | Status: DC

## 2014-04-11 MED ORDER — MAGNESIUM SULFATE IN D5W 10-5 MG/ML-% IV SOLN
1.0000 g | Freq: Once | INTRAVENOUS | Status: AC
  Administered 2014-04-11: 1 g via INTRAVENOUS
  Filled 2014-04-11: qty 100

## 2014-04-11 MED ORDER — GLYBURIDE 5 MG PO TABS
2.5000 mg | ORAL_TABLET | Freq: Two times a day (BID) | ORAL | Status: DC
  Administered 2014-04-11: 2.5 mg via ORAL
  Filled 2014-04-11: qty 1

## 2014-04-11 MED ORDER — POLYSACCHARIDE IRON COMPLEX 150 MG PO CAPS
150.0000 mg | ORAL_CAPSULE | Freq: Every day | ORAL | Status: DC
  Administered 2014-04-12: 150 mg via ORAL
  Filled 2014-04-11: qty 1

## 2014-04-11 MED ORDER — INFLUENZA VAC SPLIT QUAD 0.5 ML IM SUSY
0.5000 mL | PREFILLED_SYRINGE | INTRAMUSCULAR | Status: DC
  Filled 2014-04-11: qty 0.5

## 2014-04-11 MED ORDER — SODIUM CHLORIDE 0.9 % IV SOLN
510.0000 mg | Freq: Once | INTRAVENOUS | Status: AC
  Administered 2014-04-11: 510 mg via INTRAVENOUS
  Filled 2014-04-11: qty 17

## 2014-04-11 MED ORDER — LORAZEPAM 0.5 MG PO TABS
0.5000 mg | ORAL_TABLET | Freq: Two times a day (BID) | ORAL | Status: DC | PRN
  Administered 2014-04-11 (×2): 0.5 mg via ORAL
  Filled 2014-04-11 (×2): qty 1

## 2014-04-11 MED ORDER — ENOXAPARIN SODIUM 40 MG/0.4ML ~~LOC~~ SOLN
40.0000 mg | SUBCUTANEOUS | Status: DC
  Administered 2014-04-11 – 2014-04-12 (×2): 40 mg via SUBCUTANEOUS
  Filled 2014-04-11 (×2): qty 0.4

## 2014-04-11 MED ORDER — SIMVASTATIN 20 MG PO TABS
20.0000 mg | ORAL_TABLET | Freq: Every day | ORAL | Status: DC
  Administered 2014-04-11: 20 mg via ORAL
  Filled 2014-04-11: qty 1

## 2014-04-11 MED ORDER — METOPROLOL TARTRATE 50 MG PO TABS
50.0000 mg | ORAL_TABLET | Freq: Two times a day (BID) | ORAL | Status: DC
  Administered 2014-04-11 – 2014-04-12 (×3): 50 mg via ORAL
  Filled 2014-04-11 (×3): qty 1

## 2014-04-11 MED ORDER — SODIUM CHLORIDE 0.9 % IJ SOLN: 3.0000 mL | INTRAMUSCULAR | Status: DC | PRN

## 2014-04-11 MED ORDER — PANTOPRAZOLE SODIUM 40 MG PO TBEC
40.0000 mg | DELAYED_RELEASE_TABLET | Freq: Every day | ORAL | Status: DC
  Administered 2014-04-11 – 2014-04-12 (×2): 40 mg via ORAL
  Filled 2014-04-11 (×2): qty 1

## 2014-04-11 MED ORDER — CITALOPRAM HYDROBROMIDE 20 MG PO TABS
20.0000 mg | ORAL_TABLET | Freq: Every day | ORAL | Status: DC
  Administered 2014-04-11 – 2014-04-12 (×2): 20 mg via ORAL
  Filled 2014-04-11 (×2): qty 1

## 2014-04-11 MED ORDER — QUETIAPINE FUMARATE 25 MG PO TABS
50.0000 mg | ORAL_TABLET | Freq: Every day | ORAL | Status: DC
  Administered 2014-04-11 – 2014-04-12 (×2): 50 mg via ORAL
  Filled 2014-04-11 (×2): qty 2

## 2014-04-11 MED ORDER — ALUM & MAG HYDROXIDE-SIMETH 200-200-20 MG/5ML PO SUSP: 30.0000 mL | Freq: Four times a day (QID) | ORAL | Status: DC | PRN

## 2014-04-11 MED ORDER — NIACIN ER (ANTIHYPERLIPIDEMIC) 500 MG PO TBCR
1000.0000 mg | EXTENDED_RELEASE_TABLET | Freq: Every day | ORAL | Status: DC
  Administered 2014-04-11: 1000 mg via ORAL
  Filled 2014-04-11 (×2): qty 2

## 2014-04-11 MED ORDER — SODIUM CHLORIDE 0.9 % IJ SOLN
3.0000 mL | Freq: Two times a day (BID) | INTRAMUSCULAR | Status: DC
  Administered 2014-04-11 (×2): 3 mL via INTRAVENOUS

## 2014-04-11 MED ORDER — CLOPIDOGREL BISULFATE 75 MG PO TABS
75.0000 mg | ORAL_TABLET | Freq: Every day | ORAL | Status: DC
  Administered 2014-04-11 – 2014-04-12 (×2): 75 mg via ORAL
  Filled 2014-04-11 (×2): qty 1

## 2014-04-11 MED ORDER — MEMANTINE HCL 10 MG PO TABS
5.0000 mg | ORAL_TABLET | Freq: Every day | ORAL | Status: DC
  Administered 2014-04-11 – 2014-04-12 (×2): 5 mg via ORAL
  Filled 2014-04-11 (×2): qty 1

## 2014-04-11 MED ORDER — SODIUM CHLORIDE 0.9 % IV SOLN: 250.0000 mL | INTRAVENOUS | Status: DC | PRN

## 2014-04-11 MED ORDER — LEVALBUTEROL HCL 0.63 MG/3ML IN NEBU
0.6300 mg | INHALATION_SOLUTION | RESPIRATORY_TRACT | Status: DC
  Administered 2014-04-11 – 2014-04-12 (×5): 0.63 mg via RESPIRATORY_TRACT
  Filled 2014-04-11 (×5): qty 3

## 2014-04-11 MED ORDER — MAGNESIUM SULFATE IN D5W 10-5 MG/ML-% IV SOLN
INTRAVENOUS | Status: AC
  Filled 2014-04-11: qty 100

## 2014-04-11 MED ORDER — IPRATROPIUM BROMIDE 0.02 % IN SOLN
0.5000 mg | RESPIRATORY_TRACT | Status: DC
  Administered 2014-04-11 – 2014-04-12 (×5): 0.5 mg via RESPIRATORY_TRACT
  Filled 2014-04-11 (×5): qty 2.5

## 2014-04-11 MED ORDER — HYDROCOD POLST-CHLORPHEN POLST 10-8 MG/5ML PO LQCR
5.0000 mL | Freq: Two times a day (BID) | ORAL | Status: DC | PRN
  Administered 2014-04-11 (×2): 5 mL via ORAL
  Filled 2014-04-11 (×2): qty 5

## 2014-04-11 MED ORDER — ONDANSETRON HCL 4 MG PO TABS: 4.0000 mg | ORAL_TABLET | Freq: Four times a day (QID) | ORAL | Status: DC | PRN

## 2014-04-11 MED ORDER — ACETAMINOPHEN 325 MG PO TABS
650.0000 mg | ORAL_TABLET | Freq: Four times a day (QID) | ORAL | Status: DC | PRN
  Administered 2014-04-11: 650 mg via ORAL
  Filled 2014-04-11: qty 2

## 2014-04-11 MED ORDER — BENZONATATE 100 MG PO CAPS
200.0000 mg | ORAL_CAPSULE | Freq: Three times a day (TID) | ORAL | Status: DC
  Administered 2014-04-11 – 2014-04-12 (×3): 200 mg via ORAL
  Filled 2014-04-11 (×3): qty 2

## 2014-04-11 MED ORDER — HYDROMORPHONE HCL 1 MG/ML IJ SOLN: 0.5000 mg | INTRAMUSCULAR | Status: DC | PRN

## 2014-04-11 MED ORDER — LEVOFLOXACIN IN D5W 500 MG/100ML IV SOLN
500.0000 mg | INTRAVENOUS | Status: DC
  Administered 2014-04-11: 500 mg via INTRAVENOUS
  Filled 2014-04-11: qty 100

## 2014-04-11 MED ORDER — INSULIN ASPART 100 UNIT/ML ~~LOC~~ SOLN
0.0000 [IU] | Freq: Three times a day (TID) | SUBCUTANEOUS | Status: DC
  Administered 2014-04-11: 4 [IU] via SUBCUTANEOUS
  Administered 2014-04-11: 15 [IU] via SUBCUTANEOUS
  Administered 2014-04-12: 11 [IU] via SUBCUTANEOUS

## 2014-04-11 MED ORDER — LEVALBUTEROL HCL 0.63 MG/3ML IN NEBU: 0.6300 mg | INHALATION_SOLUTION | RESPIRATORY_TRACT | Status: DC | PRN

## 2014-04-11 MED ORDER — MAGNESIUM SULFATE 50 % IJ SOLN
3.0000 g | Freq: Once | INTRAVENOUS | Status: AC
  Administered 2014-04-11: 3 g via INTRAVENOUS
  Filled 2014-04-11: qty 6

## 2014-04-11 MED ORDER — DONEPEZIL HCL 5 MG PO TABS
10.0000 mg | ORAL_TABLET | Freq: Every day | ORAL | Status: DC
  Administered 2014-04-11: 10 mg via ORAL
  Filled 2014-04-11: qty 2

## 2014-04-11 MED ORDER — ONDANSETRON HCL 4 MG/2ML IJ SOLN: 4.0000 mg | Freq: Four times a day (QID) | INTRAMUSCULAR | Status: DC | PRN

## 2014-04-11 MED ORDER — SODIUM CHLORIDE 0.9 % IJ SOLN
3.0000 mL | Freq: Two times a day (BID) | INTRAMUSCULAR | Status: DC
Start: 2014-04-11 — End: 2014-04-12
  Administered 2014-04-11 (×2): 3 mL via INTRAVENOUS

## 2014-04-11 NOTE — Care Management Note (Addendum)
    Page 1 of 1   04/12/2014     1:41:21 PM CARE MANAGEMENT NOTE 04/12/2014  Patient:  [name],[name]   Account Number:  [id number]  Date Initiated:  04/11/2014  Documentation initiated by:  Kathyrn SheriffHILDRESS,JESSICA  Subjective/Objective Assessment:   Pt is from home with son. Pt  has neb machine and is on the waiting list for a CAP aid. Pt plans to discharge home with self care. No CM needs identified. Will continue to follow.     Action/Plan:   Anticipated DC Date:  04/13/2014   Anticipated DC Plan:  HOME/SELF CARE      DC Planning Services  CM consult      Choice offered to / List presented to:             Status of service:  Completed, signed off Medicare Important Message given?   (If response is "NO", the following Medicare IM given date fields will be blank) Date Medicare IM given:   Medicare IM given by:   Date Additional Medicare IM given:   Additional Medicare IM given by:    Discharge Disposition:  HOME/SELF CARE  Per UR Regulation:    If discussed at Long Length of Stay Meetings, dates discussed:    Comments:  04/12/2014 1320 Kathyrn SheriffJessica Childress, RN, MSN, CM Pt being discharged home today with self care. No CM needs identified. 04/11/2014 1245 Kathyrn SheriffJessica Childress, RN, MSN, CM

## 2014-04-11 NOTE — Progress Notes (Signed)
MEDICATION RELATED CONSULT NOTE - INITIAL   Pharmacy Consult for IV iron Indication: iron deficient anemia  Allergies  Allergen Reactions  . Fexofenadine Hcl Other (See Comments)    Heart attack symptoms  . Flonase [Fluticasone Propionate] Other (See Comments)    Heart attack symptoms  . Nasal Spray Other (See Comments)    Heart attack symptoms   . Nasal Spray     CAN NOT TOLERATE ANY NASAL SPRAYS!!!!!  Causes heart attack symptoms    Patient Measurements: Height: 5\' 11"  (180.3 cm) Weight: 220 lb 10.9 oz (100.1 kg) IBW/kg (Calculated) : 75.3  Vital Signs: Temp: 98.6 F (37 C) (02/10 1502) Temp Source: Oral (02/10 1502) BP: 136/55 mmHg (02/10 1502) Pulse Rate: 97 (02/10 1502) Intake/Output from previous day:   Intake/Output from this shift: Total I/O In: 720 [P.O.:720] Out: 800 [Urine:800]  Labs:  Recent Labs  04/10/14 2103 04/11/14 0602 04/11/14 0606  WBC 8.5  --  6.8  HGB 12.5*  --  11.4*  HCT 37.3*  --  35.5*  PLT 187  --  167  CREATININE 1.10  --  1.11  MG  --  1.6  --   ALBUMIN 3.8  --   --   PROT 7.5  --   --   AST 19  --   --   ALT 22  --   --   ALKPHOS 46  --   --   BILITOT 0.3  --   --    Estimated Creatinine Clearance: 64 mL/min (by C-G formula based on Cr of 1.11).   Microbiology: No results found for this or any previous visit (from the past 720 hour(s)).  Medical History: Past Medical History  Diagnosis Date  . Diabetes mellitus type 2   . GERD (gastroesophageal reflux disease)   . Hyperlipidemia     takes Niacin nightly  . DJD (degenerative joint disease)   . Hypertension     takes Metoprolol and Ramipril daily  . Dizziness   . Gout     hx of--3631yrs ago  . Bruises easily     pt is on Plavix and ASA  . Shortness of breath     wakes pt up during night with occ shortness of breathe;states it happens about once a month  . Impaired hearing   . Urinary frequency   . UTI (lower urinary tract infection)     hx of--03/2010  .  Anemia     takes Glyburide bid  . CAD (coronary artery disease) of artery bypass graft   . Peripheral vascular disease   . Pneumonia     "at least twice that I know of"  . Seizures 05/14/11    "first time was today"  . CVA (cerebral vascular accident) ~ 2009    right arm/leg occ hurts  . Carotid artery occlusion     Medications:  Scheduled:  . benzonatate  200 mg Oral TID  . citalopram  20 mg Oral Daily  . clopidogrel  75 mg Oral Daily  . donepezil  10 mg Oral QHS  . enoxaparin (LOVENOX) injection  40 mg Subcutaneous Q24H  . ferumoxytol  510 mg Intravenous Once  . [START ON 04/12/2014] Influenza vac split quadrivalent PF  0.5 mL Intramuscular Tomorrow-1000  . insulin aspart  0-20 Units Subcutaneous TID WC  . ipratropium  0.5 mg Nebulization Q4H  . [START ON 04/12/2014] iron polysaccharides  150 mg Oral Daily  . levalbuterol  0.63 mg Nebulization Q4H  .  levofloxacin (LEVAQUIN) IV  500 mg Intravenous Q24H  . memantine  5 mg Oral Daily  . methylPREDNISolone (SOLU-MEDROL) injection  80 mg Intravenous 3 times per day  . metoprolol  50 mg Oral BID  . multivitamin with minerals  1 tablet Oral Daily  . niacin  1,000 mg Oral QHS  . pantoprazole  40 mg Oral Daily  . QUEtiapine  100 mg Oral QHS  . QUEtiapine  50 mg Oral Daily  . simvastatin  20 mg Oral QHS  . sodium chloride  3 mL Intravenous Q12H  . sodium chloride  3 mL Intravenous Q12H    Assessment: 81 yo M with iron deficient anemia.  Anemia panel reviewed.  Low iron stores with normal ferritin & retic count.  No acute blood loss noted.     Goal of Therapy:  Replete iron stores  Plan:  Feraheme  IV x1 today F/U CBC, anemia panel Repeat dose if needed in 14 days Niferex  po bid as outpatient Pharmacy to sign off.  Re-consult as needed.   [name] 04/11/2014,5:34 PM

## 2014-04-11 NOTE — Progress Notes (Signed)
[facility] PROGRESS NOTE  [name] ZOX:[id number] DOB: 05/30/1931 DOA: 04/10/2014 PCP: Frederica Kuster, MD  Assessment/Plan: #1 acute COPD exacerbation Patient still with complaints of cough and wheezing. Clinical exam with some improvement. Continue oxygen, scheduled nebulizer treatments, IV steroid taper. Will start patient on IV Levaquin. Follow.  #2 chest pain Likely secondary to problem #1. Patient states chest pain associated with coughing. DC nitro paste. Continue home cardiac regimen. CT angiogram of the chest was negative for PE. Supportive care. Follow.  #3 chronic cough Concern due to ACE inhibitor treatment. ACE inhibitor has been discontinued. We'll place on [employer]. Tussionex as needed.  #4 diabetes mellitus Discontinue oral hypoglycemic agents. Sliding scale insulin.  #5 coronary artery disease status post CABG Stable. Continue home regimen aspirin, Plavix, metoprolol, atorvastatin.  #6 hyperlipidemia Continue statin.  #7 peripheral vascular disease Stable. Continue aspirin and Plavix and statin.  #8. Dementia with behavioral disturbance Stable. Continue Namenda.  #9 dyspnea Likely secondary to problem #1. Clinical improvement. See problem #1.  #10 hypertension Stable. Continue metoprolol.  #11 carotid artery occlusion/history of CEA and stent Stable. Continue aspirin and Plavix. Outpatient follow-up.  #12 history of CVA Continue Plavix and aspirin for secondary stroke prevention. Continue statin.  #13 history of seizures Stable. Not on any current regimen. Follow.  #14 iron deficiency anemia Anemia panel consistent with iron deficiency anemia. Iron level at 19. Ferritin at 105. B-12 levels a 667. Hemoglobin currently stable. Will need IV iron. Will need oral iron supplementation on discharge.  #15 prophylaxis Lovenox for DVT prophylaxis.   Code Status: Full Family Communication: Updated patient and daughter in law at  bedside. Disposition Plan: Remain inpatient.   Consultants:  None  Procedures:  CXR 04/10/14  CT angiogram chest 04/10/2014  Antibiotics:  IV Levaquin 04/11/2014  HPI/Subjective: Patient still with complaints of wheezing. Patient complaining of cough.  Objective: Filed Vitals:   04/11/14 1018  BP: 123/55  Pulse: 90  Temp: 98 F (36.7 C)  Resp: 20    Intake/Output Summary (Last 24 hours) at 04/11/14 1153 Last data filed at 04/11/14 1042  Gross per 24 hour  Intake    480 ml  Output    800 ml  Net   -320 ml   Filed Weights   04/10/14 2038 04/11/14 0030  Weight: 94.802 kg (209 lb) 100.1 kg (220 lb 10.9 oz)    Exam:   General:  NAD  Cardiovascular: RRR  Respiratory: Minimal to mild expiratory wheezing. No crackles. No rhonchi.  Abdomen: Soft, nontender, nondistended, positive bowel sounds.  Musculoskeletal: No clubbing cyanosis or edema.  Data Reviewed: Basic Metabolic Panel:  Recent Labs Lab 04/10/14 2103 04/11/14 0602 04/11/14 0606  NA 140  --  136  K 4.0  --  4.1  CL 107  --  103  CO2 29  --  22  GLUCOSE 121*  --  314*  BUN 22  --  20  CREATININE 1.10  --  1.11  CALCIUM 8.6  --  8.3*  MG  --  1.6  --    Liver Function Tests:  Recent Labs Lab 04/10/14 2103  AST 19  ALT 22  ALKPHOS 46  BILITOT 0.3  PROT 7.5  ALBUMIN 3.8   No results for input(s): LIPASE, AMYLASE in the last 168 hours. No results for input(s): AMMONIA in the last 168 hours. CBC:  Recent Labs Lab 04/10/14 2103 04/11/14 0606  WBC 8.5 6.8  NEUTROABS 5.6  --  HGB 12.5* 11.4*  HCT 37.3* 35.5*  MCV 98.7 98.6  PLT 187 167   Cardiac Enzymes:  Recent Labs Lab 04/10/14 2103 04/11/14 04/11/14 0606  TROPONINI <0.03 <0.03 <0.03   BNP (last 3 results)  Recent Labs  04/10/14 2108  BNP 21.0    ProBNP (last 3 results) No results for input(s): PROBNP in the last 8760 hours.  CBG:  Recent Labs Lab 04/11/14 0733  GLUCAP 263*    No results found for  this or any previous visit (from the past 240 hour(s)).   Studies: Ct Angio Chest Pe W/cm &/or Wo Cm  04/10/2014   CLINICAL DATA:  Dyspnea since 1500 hr. Chest pain, cough, gagging and fever.  EXAM: CT ANGIOGRAPHY CHEST WITH CONTRAST  TECHNIQUE: Multidetector CT imaging of the chest was performed using the standard protocol during bolus administration of intravenous contrast. Multiplanar CT image reconstructions and MIPs were obtained to evaluate the vascular anatomy.  CONTRAST:  OMNIPAQUE IOHEXOL 350 MG/ML SOLN  COMPARISON:  None.  FINDINGS: Technically adequate study with moderately good opacification of the central and segmental pulmonary arteries. No focal filling defects demonstrated. No evidence of significant pulmonary embolus.  Postoperative changes in the mediastinum consistent with previous bypass graft. Normal heart size. Normal caliber thoracic aorta with calcification and atherosclerotic changes. Great vessel origins are patent. No significant lymphadenopathy in the chest. Esophagus is decompressed. Small esophageal hiatal hernia.  Emphysematous changes in the lungs. No focal airspace disease or consolidation. No pneumothorax. No pleural effusions. Calcified granuloma in the right lung. Airways appear patent.  Included portions of the upper abdominal organs demonstrate fatty infiltration in the liver. Degenerative changes in the spine.  Review of the MIP images confirms the above findings.  IMPRESSION: No evidence of significant pulmonary embolus. No evidence of active pulmonary disease.   Electronically Signed   By: Burman Nieves M.D.   On: 04/10/2014 22:31   Dg Chest Portable 1 View  04/10/2014   CLINICAL DATA:  Shortness of breath, wheezing, chest pain, cough since October  EXAM: PORTABLE CHEST - 1 VIEW  COMPARISON:  Earlier films of the same day  FINDINGS: Previous CABG. Heart size upper limits normal. Lungs are clear. No pneumothorax. No effusion.  IMPRESSION: No acute disease post  CABG.   Electronically Signed   By: Corlis Leak M.D.   On: 04/10/2014 21:32    Scheduled Meds: . citalopram  20 mg Oral Daily  . clopidogrel  75 mg Oral Daily  . donepezil  10 mg Oral QHS  . enoxaparin (LOVENOX) injection  40 mg Subcutaneous Q24H  . [START ON 04/12/2014] Influenza vac split quadrivalent PF  0.5 mL Intramuscular Tomorrow-1000  . insulin aspart  0-20 Units Subcutaneous TID WC  . ipratropium  0.5 mg Nebulization Q4H  . levalbuterol  0.63 mg Nebulization Q4H  . levofloxacin (LEVAQUIN) IV  500 mg Intravenous Q24H  . magnesium sulfate 1 - 4 g bolus IVPB  3 g Intravenous Once  . memantine  5 mg Oral Daily  . methylPREDNISolone (SOLU-MEDROL) injection  80 mg Intravenous 3 times per day  . metoprolol  50 mg Oral BID  . multivitamin with minerals  1 tablet Oral Daily  . niacin  1,000 mg Oral QHS  . nitroGLYCERIN  0.5 inch Topical 4 times per day  . pantoprazole  40 mg Oral Daily  . QUEtiapine  100 mg Oral QHS  . QUEtiapine  50 mg Oral Daily  . simvastatin  20 mg Oral  QHS  . sodium chloride  3 mL Intravenous Q12H  . sodium chloride  3 mL Intravenous Q12H   Continuous Infusions:   Principal Problem:   COPD exacerbation Active Problems:   Diabetes mellitus   CAD (coronary artery disease) of artery bypass graft   Hyperlipidemia   Peripheral vascular disease   Dementia with behavioral disturbance   Dyspnea   Chest pain   Chronic cough   Essential hypertension   Carotid artery occlusion   CVA (cerebral vascular accident)   Seizures   Acute bronchospasm   Anemia   Pain in the chest    Time spent: 40 mins    [facility] LLCHOMPSON,DANIEL MD [facility] Pager [phone]. If 7PM-7AM, please contact night-coverage at [email or web address], password [email or web address] 04/11/2014, 11:53 AM

## 2014-04-12 DIAGNOSIS — R079 Chest pain, unspecified: Secondary | ICD-10-CM

## 2014-04-12 DIAGNOSIS — Z0289 Encounter for other administrative examinations: Secondary | ICD-10-CM

## 2014-04-12 DIAGNOSIS — E089 Diabetes mellitus due to underlying condition without complications: Secondary | ICD-10-CM | POA: Insufficient documentation

## 2014-04-12 LAB — BASIC METABOLIC PANEL
Anion gap: 10 (ref 5–15)
BUN: 27 mg/dL — AB (ref 6–23)
CO2: 26 mmol/L (ref 19–32)
Calcium: 8.7 mg/dL (ref 8.4–10.5)
Chloride: 101 mmol/L (ref 96–112)
Creatinine, Ser: 1.1 mg/dL (ref 0.50–1.35)
GFR calc non Af Amer: 61 mL/min — ABNORMAL LOW (ref >90)
GFR, EST AFRICAN AMERICAN: 71 mL/min — AB (ref >90)
GLUCOSE: 286 mg/dL — AB (ref 70–99)
Potassium: 4.4 mmol/L (ref 3.5–5.1)
SODIUM: 137 mmol/L (ref 135–145)

## 2014-04-12 LAB — CBC
HEMATOCRIT: 37 % — AB (ref 39.0–52.0)
HEMOGLOBIN: 12.1 g/dL — AB (ref 13.0–17.0)
MCH: 32.3 pg (ref 26.0–34.0)
MCHC: 32.7 g/dL (ref 30.0–36.0)
MCV: 98.7 fL (ref 78.0–100.0)
Platelets: 184 10*3/uL (ref 150–400)
RBC: 3.75 MIL/uL — AB (ref 4.22–5.81)
RDW: 14 % (ref 11.5–15.5)
WBC: 12.4 10*3/uL — ABNORMAL HIGH (ref 4.0–10.5)

## 2014-04-12 LAB — GLUCOSE, CAPILLARY
GLUCOSE-CAPILLARY: 265 mg/dL — AB (ref 70–99)
Glucose-Capillary: 274 mg/dL — ABNORMAL HIGH (ref 70–99)

## 2014-04-12 LAB — HEMOGLOBIN A1C
Hgb A1c MFr Bld: 7.8 % — ABNORMAL HIGH (ref 4.8–5.6)
Mean Plasma Glucose: 177 mg/dL

## 2014-04-12 LAB — MAGNESIUM: Magnesium: 2.4 mg/dL (ref 1.5–2.5)

## 2014-04-12 MED ORDER — POLYSACCHARIDE IRON COMPLEX 150 MG PO CAPS: 150.0000 mg | ORAL_CAPSULE | Freq: Two times a day (BID) | ORAL | Status: DC

## 2014-04-12 MED ORDER — LEVALBUTEROL HCL 0.63 MG/3ML IN NEBU: 0.6300 mg | INHALATION_SOLUTION | Freq: Three times a day (TID) | RESPIRATORY_TRACT | Status: DC

## 2014-04-12 MED ORDER — BENZONATATE 200 MG PO CAPS: 200.0000 mg | ORAL_CAPSULE | Freq: Three times a day (TID) | ORAL | Status: DC

## 2014-04-12 MED ORDER — METHYLPREDNISOLONE SODIUM SUCC 125 MG IJ SOLR: 80.0000 mg | Freq: Two times a day (BID) | INTRAMUSCULAR | Status: DC

## 2014-04-12 MED ORDER — LEVOFLOXACIN 500 MG PO TABS: 500.0000 mg | ORAL_TABLET | Freq: Every day | ORAL | Status: DC

## 2014-04-12 MED ORDER — LEVOFLOXACIN 500 MG PO TABS
500.0000 mg | ORAL_TABLET | Freq: Every day | ORAL | Status: DC
  Administered 2014-04-12: 500 mg via ORAL
  Filled 2014-04-12: qty 1

## 2014-04-12 MED ORDER — IPRATROPIUM BROMIDE 0.02 % IN SOLN: 0.5000 mg | Freq: Three times a day (TID) | RESPIRATORY_TRACT | Status: DC

## 2014-04-12 MED ORDER — PANTOPRAZOLE SODIUM 40 MG PO TBEC: 40.0000 mg | DELAYED_RELEASE_TABLET | Freq: Every day | ORAL | Status: DC

## 2014-04-12 MED ORDER — OXYCODONE HCL 5 MG PO TABS: 5.0000 mg | ORAL_TABLET | ORAL | Status: DC | PRN

## 2014-04-12 NOTE — Care Management Utilization Note (Signed)
UR completed 

## 2014-04-12 NOTE — Progress Notes (Signed)
Inpatient Diabetes Program Recommendations  AACE/ADA: New Consensus Statement on Inpatient Glycemic Control (2013)  Target Ranges:  Prepandial:   less than 140 mg/dL      Peak postprandial:   less than 180 mg/dL (1-2 hours)      Critically ill patients:  140 - 180 mg/dL   Reason for Visit: Elevated blood glucose Results for Markus JarvisBULLINS, Marcanthony H (MRN [id number]) as of 04/12/2014 08:46  Ref. Range 04/11/2014 07:33 04/11/2014 11:51 04/11/2014 16:20 04/11/2014 21:05 04/12/2014 07:18  Glucose-Capillary Latest Range: 70-99 mg/dL 102263 (H) 725310 (H) 366164 (H) 211 (H) 265 (H)   Diabetes history: Type 2  HgbA1c: 7.8 Outpatient Diabetes medications: Glyburide ( progress note states that pt does not take ) Current orders for Inpatient glycemic control: Resistant correction scale TID with meals  If blood sugars continue to be elevated while on steroids, please consider adding Lantus to current order.  Mellissa KohutSherrie Demecia Northway RD, CDE. M.Ed. Pager [phone] Inpatient Diabetes Coordinator

## 2014-04-12 NOTE — Discharge Summary (Signed)
Physician Discharge Summary  [name] ZOX:[id number] DOB: 06-08-1931 DOA: 04/10/2014  PCP: [name], [name], MD  Admit date: 04/10/2014 Discharge date: 04/12/2014  Time spent: 65 minutes  Recommendations for Outpatient Follow-up:  1. Follow-up with [name], [name], MD in 1 week. On follow-up patient's COPD exacerbation will need to be reassessed. Patient will need a basic metabolic profile done to follow-up on electrolytes and renal function. Patient need a CBC done to follow-up on his H&H.  Discharge Diagnoses:  Principal Problem:   COPD exacerbation Active Problems:   Diabetes mellitus   CAD (coronary artery disease) of artery bypass graft   Hyperlipidemia   Peripheral vascular disease   Dementia with behavioral disturbance   Dyspnea   Chest pain   Chronic cough   Essential hypertension   Carotid artery occlusion   CVA (cerebral vascular accident)   Seizures   Acute bronchospasm   Anemia   Pain in the chest   Diabetes mellitus due to underlying condition without complications   Discharge Condition: Stable and improved  Diet recommendation: Carb modified  Filed Weights   04/10/14 2038 04/11/14 0030  Weight: 94.802 kg (209 lb) 100.1 kg (220 lb 10.9 oz)    History of present illness:  [name] JarvisSylvester H Moncure is a 81 y.o. male with a history of CAD S/P CABG, CVA, HTN, DM2, Hyperlipidemia, PVD, Gout and Dementia who presented to the ED with complaints of Worsening SOB, and increased coughing and Wheezing and Chest pain the evening of admission. He reported his symptoms were worse the evening of admission, and reported having a severe cough since 11/2013. He reported in 11/2013 he had bronchial pneumonia and his cough never went away. He reported that he was [facility] [place] and was placed on Tessalon Perles for cough and Prednisone Rx. His symptoms continued to worsen so he came to the ED at [facility]. He  reported having increased wheezing and chest pain worse with breathing and coughing, However he reported that he has had SSCP off and for a few weeks. He is a former smoker and quit smoking 35 years ago  Hospital Course:  #1 acute COPD exacerbation Patient had presented with cough which have been somewhat chronic in nature as well as wheezing and shortness of breath. Patient was admitted cardiac enzymes which was cycled were negative 3. Chest x-ray which was ordered was negative for any acute infiltrate. Patient was placed on oxygen, scheduled nebulizers, IV steroids, and started on IV Levaquin. Patient improved clinically and IV steroids were tapered to be discharged home on the oral steroid taper. Patient's IV antibiotics were subsequently transitioned to oral antibiotics which she tolerated. Patient was discharged home off 5 days of oral Levaquin, scheduled nebulizers, steroid taper. Patient will follow-up with PCP as outpatient.   #2 chest pain Likely secondary to problem #1. Patient stated chest pain associated with coughing. Nitropaste was discontinued. Cardiac enzymes which was cycled were negative 3. Patient was maintained on his home regimen of cardiac medications. CT angiogram of the chest was negative for PE. Supportive care. Follow.  #3 chronic cough Concern due to ACE inhibitor treatment. ACE inhibitor has been discontinued. Patient was placed on Tessalon Perles and Tussionex during the hospitalization. Patient will be discharged off his ACE inhibitor to see whether he has any improvement in his chronic cough. Patient has also been started on a PPI. Outpatient follow-up.   #4 diabetes mellitus Discontinued oral  hypoglycemic agents. Placed on Sliding scale insulin.  #5 coronary artery disease status post CABG Stable. Continued on home regimen aspirin, Plavix, metoprolol, atorvastatin.  #6 hyperlipidemia Continued on statin.  #7 peripheral vascular disease Stable. Continued on  aspirin and Plavix and statin.  #8. Dementia with behavioral disturbance Stable. Continued on home regimen of Namenda.  #9 dyspnea Likely secondary to problem #1. Patient was treated for COPD exacerbation with significant clinical improvement. Patient was close to baseline by day of discharge.   #10 hypertension Stable. Continued on metoprolol.  #11 carotid artery occlusion/history of CEA and stent Stable. Continued on aspirin and Plavix. Outpatient follow-up.  #12 history of CVA Continued on home regimen of Plavix and aspirin for secondary stroke prevention. Continued on statin.  #13 history of seizures Stable. Not on any current regimen. Follow.  #14 iron deficiency anemia Anemia panel consistent with iron deficiency anemia. Iron level at 19. Ferritin at 105. B-12 levels a 667. Hemoglobin  stabl remained.  Patient was placed on IV iron and will be discharged home on oral iron supplementation. Outpatient follow-up with PCP.    Procedures:  CXR 04/10/14  CT angiogram chest 04/10/2014    Consultations:  None  Discharge Exam: Filed Vitals:   04/12/14 0856  BP:   Pulse: 67  Temp:   Resp: 17    General: NAD Cardiovascular: RRR  Respiratory: CTAB.  Discharge Instructions   Discharge Instructions    Diet Carb Modified    Complete by:  As directed      Discharge instructions    Complete by:  As directed   Follow up with Frederica Kuster, MD in 1 week.     Increase activity slowly    Complete by:  As directed           Current Discharge Medication List    START taking these medications   Details  ipratropium (ATROVENT) 0.02 % nebulizer solution Take 2.5 mLs (0.5 mg total) by nebulization 3 (three) times daily. Take for 5 days, then may use every 6 hours as needed. Qty: 75 mL, Refills: 0    iron polysaccharides (NIFEREX) 150 MG capsule Take 1 capsule (150 mg total) by mouth 2 (two) times daily. Qty: 60 capsule, Refills: 0    levalbuterol (XOPENEX) 0.63  MG/3ML nebulizer solution Take 3 mLs (0.63 mg total) by nebulization 3 (three) times daily. Use 3 times daily for 5 days, then use every 4 hours as needed for wheezing. Qty: 72 mL, Refills: 0    levofloxacin (LEVAQUIN) 500 MG tablet Take 1 tablet (500 mg total) by mouth daily. Take for 5 days then stop. Qty: 5 tablet, Refills: 0    oxyCODONE (OXY IR/ROXICODONE) 5 MG immediate release tablet Take 1 tablet (5 mg total) by mouth every 4 (four) hours as needed for moderate pain. Qty: 15 tablet, Refills: 0    pantoprazole (PROTONIX) 40 MG tablet Take 1 tablet (40 mg total) by mouth daily. Qty: 30 tablet, Refills: 0      CONTINUE these medications which have CHANGED   Details  benzonatate (TESSALON) 200 MG capsule Take 1 capsule (200 mg total) by mouth 3 (three) times daily. Take for 5 days then stop. Qty: 20 capsule, Refills: 0      CONTINUE these medications which have NOT CHANGED   Details  aspirin EC 81 MG EC tablet Take 1 tablet (81 mg total) by mouth 2 (two) times daily.    clopidogrel (PLAVIX) 75 MG tablet Take 1 tablet (  75 mg total) by mouth daily. Qty: 90 tablet, Refills: 1    LORazepam (ATIVAN) 0.5 MG tablet TAKE ONE TABLET BY MOUTH TWICE DAILY AS NEEDED FOR ANXIETY Qty: 30 tablet, Refills: 0    memantine (NAMENDA) 5 MG tablet Take 5 mg by mouth daily.    metoprolol (LOPRESSOR) 50 MG tablet Take 50 mg by mouth 2 (two) times daily.    Multiple Vitamin (MULITIVITAMIN WITH MINERALS) TABS Take 1 tablet by mouth daily.    niacin (NIASPAN) 1000 MG CR tablet Take 1 tablet (1,000 mg total) by mouth at bedtime. Qty: 90 tablet, Refills: 3   Associated Diagnoses: Hyperlipidemia    predniSONE (DELTASONE) 20 MG tablet Take 20-60 mg by mouth See admin instructions. TAKE THREE TABLETS DAILY FOR 3 DAYS, THEN TAKE TWO TABLETS DAILY FOR 3 DAYS, THEN TAKE ONE TABLET DAILY FOR 3 DAYS. HAS NOT YET STARTED COURSE    QUEtiapine (SEROQUEL) 50 MG tablet Take one tablet by mouth in AM and two  tablets at bedtime Qty: 90 tablet, Refills: 3    ACCU-CHEK AVIVA PLUS test strip TEST TWICE A DAY Qty: 100 each, Refills: 5    Calcium Carb-Cholecalciferol (CALCIUM 600+D3) 600-800 MG-UNIT TABS Take 1 tablet by mouth 2 (two) times daily. Qty: 60 tablet, Refills: 1    citalopram (CELEXA) 20 MG tablet Take 1 tablet (20 mg total) by mouth daily. Qty: 90 tablet, Refills: 0   Associated Diagnoses: Adjustment reaction of adult life    donepezil (ARICEPT) 10 MG tablet Take 1 tablet (10 mg total) by mouth at bedtime. Qty: 10 tablet, Refills: 0    glyBURIDE (DIABETA) 2.5 MG tablet Take 1 tablet (2.5 mg total) by mouth 2 (two) times daily. Qty: 180 tablet, Refills: 3   Associated Diagnoses: Diabetes mellitus    simvastatin (ZOCOR) 20 MG tablet TAKE 1 TABLET AT BEDTIME Qty: 90 tablet, Refills: 0      STOP taking these medications     albuterol (PROVENTIL) (2.5 MG/3ML) 0.083% nebulizer solution      amoxicillin-clavulanate (AUGMENTIN) 875-125 MG per tablet      ramipril (ALTACE) 2.5 MG capsule        Allergies  Allergen Reactions  . Fexofenadine Hcl Other (See Comments)    Heart attack symptoms  . Flonase [Fluticasone Propionate] Other (See Comments)    Heart attack symptoms  . Nasal Spray Other (See Comments)    Heart attack symptoms   . Nasal Spray     CAN NOT TOLERATE ANY NASAL SPRAYS!!!!!  Causes heart attack symptoms   Follow-up Information    Follow up with Frederica Kuster, MD. Schedule an appointment as soon as possible for a visit in 1 week.   Specialty:  Family Medicine   Contact information:   [address] [place] [address] [phone]        The results of significant diagnostics from this hospitalization (including imaging, microbiology, ancillary and laboratory) are listed below for reference.    Significant Diagnostic Studies: Ct Angio Chest Pe W/cm &/or Wo Cm  04/10/2014   CLINICAL DATA:  Dyspnea since 1500 hr. Chest pain, cough, gagging and  fever.  EXAM: CT ANGIOGRAPHY CHEST WITH CONTRAST  TECHNIQUE: Multidetector CT imaging of the chest was performed using the standard protocol during bolus administration of intravenous contrast. Multiplanar CT image reconstructions and MIPs were obtained to evaluate the vascular anatomy.  CONTRAST:  OMNIPAQUE IOHEXOL 350 MG/ML SOLN  COMPARISON:  None.  FINDINGS: Technically adequate study with  moderately good opacification of the central and segmental pulmonary arteries. No focal filling defects demonstrated. No evidence of significant pulmonary embolus.  Postoperative changes in the mediastinum consistent with previous bypass graft. Normal heart size. Normal caliber thoracic aorta with calcification and atherosclerotic changes. Great vessel origins are patent. No significant lymphadenopathy in the chest. Esophagus is decompressed. Small esophageal hiatal hernia.  Emphysematous changes in the lungs. No focal airspace disease or consolidation. No pneumothorax. No pleural effusions. Calcified granuloma in the right lung. Airways appear patent.  Included portions of the upper abdominal organs demonstrate fatty infiltration in the liver. Degenerative changes in the spine.  Review of the MIP images confirms the above findings.  IMPRESSION: No evidence of significant pulmonary embolus. No evidence of active pulmonary disease.   Electronically Signed   By: Burman Nieves M.D.   On: 04/10/2014 22:31   Dg Chest Portable 1 View  04/10/2014   CLINICAL DATA:  Shortness of breath, wheezing, chest pain, cough since October  EXAM: PORTABLE CHEST - 1 VIEW  COMPARISON:  Earlier films of the same day  FINDINGS: Previous CABG. Heart size upper limits normal. Lungs are clear. No pneumothorax. No effusion.  IMPRESSION: No acute disease post CABG.   Electronically Signed   By: Corlis Leak M.D.   On: 04/10/2014 21:32    Microbiology: No results found for this or any previous visit (from the past 240 hour(s)).   Labs: Basic  Metabolic Panel:  Recent Labs Lab 04/10/14 2103 04/11/14 0602 04/11/14 0606 04/12/14 0550  NA 140  --  136 137  K 4.0  --  4.1 4.4  CL 107  --  103 101  CO2 29  --  22 26  GLUCOSE 121*  --  314* 286*  BUN 22  --  20 27*  CREATININE 1.10  --  1.11 1.10  CALCIUM 8.6  --  8.3* 8.7  MG  --  1.6  --  2.4   Liver Function Tests:  Recent Labs Lab 04/10/14 2103  AST 19  ALT 22  ALKPHOS 46  BILITOT 0.3  PROT 7.5  ALBUMIN 3.8   No results for input(s): LIPASE, AMYLASE in the last 168 hours. No results for input(s): AMMONIA in the last 168 hours. CBC:  Recent Labs Lab 04/10/14 2103 04/11/14 0606 04/12/14 0550  WBC 8.5 6.8 12.4*  NEUTROABS 5.6  --   --   HGB 12.5* 11.4* 12.1*  HCT 37.3* 35.5* 37.0*  MCV 98.7 98.6 98.7  PLT 187 167 184   Cardiac Enzymes:  Recent Labs Lab 04/10/14 2103 04/11/14 04/11/14 0606 04/11/14 1149  TROPONINI <0.03 <0.03 <0.03 <0.03   BNP: BNP (last 3 results)  Recent Labs  04/10/14 2108  BNP 21.0    ProBNP (last 3 results) No results for input(s): PROBNP in the last 8760 hours.  CBG:  Recent Labs Lab 04/11/14 1151 04/11/14 1620 04/11/14 2105 04/12/14 0718 04/12/14 1114  GLUCAP 310* 164* 211* 265* 274*       Signed:  THOMPSON,DANIEL MD [facility] 04/12/2014, 12:11 PM

## 2014-04-13 ENCOUNTER — Telehealth: Payer: Self-pay

## 2014-04-13 ENCOUNTER — Telehealth: Payer: Self-pay | Admitting: Family Medicine

## 2014-04-13 MED ORDER — BENZONATATE 100 MG PO CAPS: 100.0000 mg | ORAL_CAPSULE | Freq: Three times a day (TID) | ORAL | Status: DC | PRN

## 2014-04-13 NOTE — Telephone Encounter (Signed)
tessalon perle rx sent  To pharmacy

## 2014-04-13 NOTE — Telephone Encounter (Signed)
Daughter-in-law called to report pt. Was discharged from the [facility] Yesterday.  Stated he was hospitalized for his breathing and for chest pain.  She reported that he was told to contact his Vascular Surgeon for headaches and c/o left-sided neck pain.  Reported pt. Has c/o left neck hurting x 2 weeks, and c/o headache for approx. 3-4 days.  Stated pt. Has dementia.  Related hx of stroke.  Reported he seems to be shaking more than usual, and is staggering when he walks.  Stated he has had right sided weakness from previous stroke, but that it seems to be increased.  Is requesting to move his carotid surveillance appt. to an earlier date than his April 6th appt.  Advised that will call back with an option to be evaluated sooner.  Recommended if pt's. symptoms worsen prior to the f/u appt., to go to the ER, or contact his PCP.  Verb. Understanding.

## 2014-04-13 NOTE — Telephone Encounter (Signed)
Patient aware.

## 2014-04-13 NOTE — Telephone Encounter (Signed)
Spoke with patients daughter in law to schedule appointment, dpm

## 2014-04-18 ENCOUNTER — Telehealth: Payer: Self-pay | Admitting: Family Medicine

## 2014-04-19 MED ORDER — HYDROCODONE-HOMATROPINE 5-1.5 MG/5ML PO SYRP: 5.0000 mL | ORAL_SOLUTION | Freq: Three times a day (TID) | ORAL | Status: DC | PRN

## 2014-04-19 NOTE — Telephone Encounter (Signed)
Aware, script for cough medication ready for pick up.

## 2014-04-19 NOTE — Telephone Encounter (Signed)
Hycodan filled

## 2014-04-20 ENCOUNTER — Other Ambulatory Visit: Payer: Self-pay | Admitting: Family Medicine

## 2014-04-20 NOTE — Telephone Encounter (Signed)
Last filled 03/05/14, last seen 04/03/14. Call into [facility]

## 2014-04-24 ENCOUNTER — Telehealth: Payer: Self-pay | Admitting: Family Medicine

## 2014-04-25 ENCOUNTER — Encounter: Payer: Self-pay | Admitting: Cardiology

## 2014-04-25 ENCOUNTER — Ambulatory Visit (INDEPENDENT_AMBULATORY_CARE_PROVIDER_SITE_OTHER): Payer: Medicare Other | Admitting: Cardiology

## 2014-04-25 VITALS — BP 120/78 | HR 88 | Ht 71.0 in | Wt 216.0 lb

## 2014-04-25 DIAGNOSIS — I25810 Atherosclerosis of coronary artery bypass graft(s) without angina pectoris: Secondary | ICD-10-CM

## 2014-04-25 MED ORDER — LORAZEPAM 0.5 MG PO TABS: ORAL_TABLET | ORAL | Status: DC

## 2014-04-25 NOTE — Patient Instructions (Signed)
The current medical regimen is effective;  continue present plan and medications.  Your physician has requested that you have a lexiscan myoview. For further information please visit [email or web address]. Please follow instruction sheet, as given.  Follow up in 4 months with Dr. Antoine PocheHochrein in 4 months.  Thank you for choosing [facility]!!

## 2014-04-25 NOTE — Progress Notes (Signed)
HPI The patient presents for followup of his coronary disease. Since I last saw him he has lost his wife to ALS. This was in the fall. He is certainly depressed this. He was hospitalized this month with pneumonia. I reviewed these records. There were no clear cardiac issues and he had negative cardiac enzymes. However, he's had cough. He has pain when he coughs or takes a deep breath. He still short of breath. He is also having symptoms almost daily where he gets anxious like he can't read has to stand up to walk around feels his heart pounding. His daughter will give him an antianxiety pill and it will resolve. He doesn't recall having symptoms like this before. He can't get when it will come on. Is not any particular time of day. He's not describing classic chest pressure, neck or arm discomfort. He's not having any PND or orthopnea. He has had no weight gain or edema.   Allergies  Allergen Reactions  . Fexofenadine Hcl Other (See Comments)    Heart attack symptoms  . Flonase [Fluticasone Propionate] Other (See Comments)    Heart attack symptoms  . Nasal Spray Other (See Comments)    Heart attack symptoms   . Nasal Spray     CAN NOT TOLERATE ANY NASAL SPRAYS!!!!!  Causes heart attack symptoms    Current Outpatient Prescriptions  Medication Sig Dispense Refill  . aspirin EC 81 MG EC tablet Take 1 tablet (81 mg total) by mouth 2 (two) times daily.    . Calcium Carb-Cholecalciferol (CALCIUM 600+D3) 600-800 MG-UNIT TABS Take 1 tablet by mouth 2 (two) times daily. 60 tablet 1  . clopidogrel (PLAVIX) 75 MG tablet Take 1 tablet (75 mg total) by mouth daily. 90 tablet 1  . glyBURIDE (DIABETA) 2.5 MG tablet Take 1 tablet (2.5 mg total) by mouth 2 (two) times daily. 180 tablet 3  . ipratropium (ATROVENT) 0.02 % nebulizer solution Take 2.5 mLs (0.5 mg total) by nebulization 3 (three) times daily. Take for 5 days, then may use every 6 hours as needed. 75 mL 0  . iron polysaccharides (NIFEREX) 150  MG capsule Take 1 capsule (150 mg total) by mouth 2 (two) times daily. 60 capsule 0  . levalbuterol (XOPENEX) 0.63 MG/3ML nebulizer solution Take 3 mLs (0.63 mg total) by nebulization 3 (three) times daily. Use 3 times daily for 5 days, then use every 4 hours as needed for wheezing. 72 mL 0  . LORazepam (ATIVAN) 0.5 MG tablet TAKE ONE TABLET BY MOUTH TWICE DAILY AS NEEDED FOR ANXIETY (Patient taking differently: TAKE ONE TABLET BY MOUTH DAILY AS NEEDED FOR ANXIETY) 30 tablet 0  . memantine (NAMENDA TITRATION PACK) tablet pack Take 21 mg by mouth See admin instructions. 5 mg/day for =1 week; 5 mg twice daily for =1 week; 15 mg/day given in 5 mg and 10 mg separated doses for =1 week; then 10 mg twice daily    . metoprolol (LOPRESSOR) 50 MG tablet Take 25 mg by mouth 2 (two) times daily.     . Multiple Vitamin (MULITIVITAMIN WITH MINERALS) TABS Take 1 tablet by mouth daily.    . niacin (NIASPAN) 1000 MG CR tablet Take 1 tablet (1,000 mg total) by mouth at bedtime. 90 tablet 3  . pantoprazole (PROTONIX) 40 MG tablet Take 1 tablet (40 mg total) by mouth daily. 30 tablet 0  . QUEtiapine (SEROQUEL) 50 MG tablet Take one tablet by mouth in AM and two tablets at bedtime (Patient  taking differently: Take 50-100 mg by mouth 2 (two) times daily. Take one tablet by mouth in AM and two tablets at bedtime) 90 tablet 3  . ramipril (ALTACE) 2.5 MG capsule Take 2.5 mg by mouth daily.    Marland Kitchen ACCU-CHEK AVIVA PLUS test strip TEST TWICE A DAY 100 each 5  . HYDROcodone-homatropine (HYCODAN) 5-1.5 MG/5ML syrup Take 5 mLs by mouth every 8 (eight) hours as needed for cough. (Patient not taking: Reported on 04/25/2014) 120 mL 0   No current facility-administered medications for this visit.    Past Medical History  Diagnosis Date  . Diabetes mellitus type 2   . GERD (gastroesophageal reflux disease)   . Hyperlipidemia     takes Niacin nightly  . DJD (degenerative joint disease)   . Hypertension     takes Metoprolol and  Ramipril daily  . Dizziness   . Gout     hx of--2yrs ago  . Bruises easily     pt is on Plavix and ASA  . Shortness of breath     wakes pt up during night with occ shortness of breathe;states it happens about once a month  . Impaired hearing   . Urinary frequency   . UTI (lower urinary tract infection)     hx of--03/2010  . Anemia     takes Glyburide bid  . CAD (coronary artery disease) of artery bypass graft   . Peripheral vascular disease   . Pneumonia     "at least twice that I know of"  . Seizures 05/14/11    "first time was today"  . CVA (cerebral vascular accident) ~ 2009    right arm/leg occ hurts  . Carotid artery occlusion     Past Surgical History  Procedure Laterality Date  . Appendectomy  1968  . Colonoscopy    . Endarterectomy  04/09/2011    Procedure: ENDARTERECTOMY CAROTID;  Surgeon: Chuck Hint, MD;  Location: [facility] OR;  Service: Vascular;  Laterality: Left;  Left Carotid endarterectomy With Dacron Patch Angioplasty  . Cardiac catheterization  1998    dr hochrin  . Endarterectomy  05/08/2011    Procedure: ENDARTERECTOMY CAROTID;  Surgeon: Chuck Hint, MD;  Location: [facility] OR;  Service: Vascular;  Laterality: Right;  with [place]am[place]ield Patch Angioplasty  . Cataract extraction w/ intraocular lens  implant, bilateral  2012  . Coronary artery bypass graft  1998    LIMA to LAD, SVG to diagonal, SVG to PDA, SVG to posterior lateral.   . Eye surgery  1953    right ; "piece of metal removed"  . Eye surgery  2012    Cataract- Bilateral  . Carotid endarterectomy      ROS:  As stated in the HPI and negative for all other systems.   PHYSICAL EXAM BP 120/78 mmHg  Pulse 88  Ht  (1.803 m)  Wt 216 lb (97.977 kg)  BMI 30.14 kg/m2 GENERAL:  Well appearing HEENT:  Pupils equal round and reactive, fundi not visualized, oral mucosa unremarkable, well healed endarterectomy scars NECK:  No jugular venous distention, waveform within normal limits, carotid  upstroke brisk and symmetric, bilatral bruits, no thyromegaly LYMPHATICS:  No cervical, inguinal adenopathy LUNGS:  Clear to auscultation bilaterally BACK:  No CVA tenderness CHEST:  Well healed sternotomy scar. HEART:  PMI not displaced or sustained,S1 and S2 within normal limits, no S3, no S4, no clicks, no rubs, no murmurs ABD:  Flat, positive bowel sounds normal in frequency in  pitch, no bruits, no rebound, no guarding, no midline pulsatile mass, no hepatomegaly, no splenomegaly EXT:  2 plus pulses upper, absent DP/PT right and decreased on the left, no edema, no cyanosis no clubbing   ASSESSMENT AND PLAN  CAD (coronary artery disease) of artery bypass graft -  I doubt that his current symptoms are cardiac. I think he's having panic attacks and I referred him back to Dr. Hyacinth Meeker to discuss this further. I will however rule out ischemic heart disease with a  Lexiscan Myoview.  Hypertension -  His blood pressure is well controlled. He will continue the meds as listed.  Hyperlipidemia -  This is followed by Frederica Kuster, MD     HPI The patient presents for followup of his coronary disease. Since I last saw him he has had bilateral carotid endarterectomies. He did have a seizure complicating this thought possibly to be embolic. However, he thinks has recovered completely from this. He thinks he is doing well from a cardiovascular standpoint. He walks daily.  The patient denies any new symptoms such as chest discomfort, neck or arm discomfort. There has been no new shortness of breath, PND or orthopnea. There have been no reported palpitations, presyncope or syncope.  Allergies  Allergen Reactions  . Fexofenadine Hcl Other (See Comments)    Heart attack symptoms  . Flonase [Fluticasone Propionate] Other (See Comments)    Heart attack symptoms  . Nasal Spray Other (See Comments)    Heart attack symptoms   . Nasal Spray     CAN NOT TOLERATE ANY NASAL SPRAYS!!!!!  Causes heart  attack symptoms    Current Outpatient Prescriptions  Medication Sig Dispense Refill  . aspirin EC 81 MG EC tablet Take 1 tablet (81 mg total) by mouth 2 (two) times daily.    . Calcium Carb-Cholecalciferol (CALCIUM 600+D3) 600-800 MG-UNIT TABS Take 1 tablet by mouth 2 (two) times daily. 60 tablet 1  . clopidogrel (PLAVIX) 75 MG tablet Take 1 tablet (75 mg total) by mouth daily. 90 tablet 1  . glyBURIDE (DIABETA) 2.5 MG tablet Take 1 tablet (2.5 mg total) by mouth 2 (two) times daily. 180 tablet 3  . ipratropium (ATROVENT) 0.02 % nebulizer solution Take 2.5 mLs (0.5 mg total) by nebulization 3 (three) times daily. Take for 5 days, then may use every 6 hours as needed. 75 mL 0  . iron polysaccharides (NIFEREX) 150 MG capsule Take 1 capsule (150 mg total) by mouth 2 (two) times daily. 60 capsule 0  . levalbuterol (XOPENEX) 0.63 MG/3ML nebulizer solution Take 3 mLs (0.63 mg total) by nebulization 3 (three) times daily. Use 3 times daily for 5 days, then use every 4 hours as needed for wheezing. 72 mL 0  . LORazepam (ATIVAN) 0.5 MG tablet TAKE ONE TABLET BY MOUTH TWICE DAILY AS NEEDED FOR ANXIETY (Patient taking differently: TAKE ONE TABLET BY MOUTH DAILY AS NEEDED FOR ANXIETY) 30 tablet 0  . memantine (NAMENDA TITRATION PACK) tablet pack Take 21 mg by mouth See admin instructions. 5 mg/day for =1 week; 5 mg twice daily for =1 week; 15 mg/day given in 5 mg and 10 mg separated doses for =1 week; then 10 mg twice daily    . metoprolol (LOPRESSOR) 50 MG tablet Take 25 mg by mouth 2 (two) times daily.     . Multiple Vitamin (MULITIVITAMIN WITH MINERALS) TABS Take 1 tablet by mouth daily.    . niacin (NIASPAN) 1000 MG CR tablet Take 1 tablet (1,000  mg total) by mouth at bedtime. 90 tablet 3  . pantoprazole (PROTONIX) 40 MG tablet Take 1 tablet (40 mg total) by mouth daily. 30 tablet 0  . QUEtiapine (SEROQUEL) 50 MG tablet Take one tablet by mouth in AM and two tablets at bedtime (Patient taking differently:  Take 50-100 mg by mouth 2 (two) times daily. Take one tablet by mouth in AM and two tablets at bedtime) 90 tablet 3  . ramipril (ALTACE) 2.5 MG capsule Take 2.5 mg by mouth daily.    Marland Kitchen ACCU-CHEK AVIVA PLUS test strip TEST TWICE A DAY 100 each 5  . HYDROcodone-homatropine (HYCODAN) 5-1.5 MG/5ML syrup Take 5 mLs by mouth every 8 (eight) hours as needed for cough. (Patient not taking: Reported on 04/25/2014) 120 mL 0   No current facility-administered medications for this visit.    Past Medical History  Diagnosis Date  . Diabetes mellitus type 2   . GERD (gastroesophageal reflux disease)   . Hyperlipidemia     takes Niacin nightly  . DJD (degenerative joint disease)   . Hypertension     takes Metoprolol and Ramipril daily  . Dizziness   . Gout     hx of--30yrs ago  . Bruises easily     pt is on Plavix and ASA  . Shortness of breath     wakes pt up during night with occ shortness of breathe;states it happens about once a month  . Impaired hearing   . Urinary frequency   . UTI (lower urinary tract infection)     hx of--03/2010  . Anemia     takes Glyburide bid  . CAD (coronary artery disease) of artery bypass graft   . Peripheral vascular disease   . Pneumonia     "at least twice that I know of"  . Seizures 05/14/11    "first time was today"  . CVA (cerebral vascular accident) ~ 2009    right arm/leg occ hurts  . Carotid artery occlusion     Past Surgical History  Procedure Laterality Date  . Appendectomy  1968  . Colonoscopy    . Endarterectomy  04/09/2011    Procedure: ENDARTERECTOMY CAROTID;  Surgeon: Chuck Hint, MD;  Location: [facility] OR;  Service: Vascular;  Laterality: Left;  Left Carotid endarterectomy With Dacron Patch Angioplasty  . Cardiac catheterization  1998    dr hochrin  . Endarterectomy  05/08/2011    Procedure: ENDARTERECTOMY CAROTID;  Surgeon: Chuck Hint, MD;  Location: [facility] OR;  Service: Vascular;  Laterality: Right;  with [place] Patch  Angioplasty  . Cataract extraction w/ intraocular lens  implant, bilateral  2012  . Coronary artery bypass graft  1998    LIMA to LAD, SVG to diagonal, SVG to PDA, SVG to posterior lateral.   . Eye surgery  1953    right ; "piece of metal removed"  . Eye surgery  2012    Cataract- Bilateral  . Carotid endarterectomy      ROS:  As stated in the HPI and negative for all other systems.   PHYSICAL EXAM BP 120/78 mmHg  Pulse 88  Ht  (1.803 m)  Wt 216 lb (97.977 kg)  BMI 30.14 kg/m2 GENERAL:  Well appearing HEENT:  Pupils equal round and reactive, fundi not visualized, oral mucosa unremarkable, well healed endarterectomy scars NECK:  No jugular venous distention, waveform within normal limits, carotid upstroke brisk and symmetric, bilatral bruits, no thyromegaly LYMPHATICS:  No cervical, inguinal adenopathy LUNGS:  Clear to auscultation bilaterally BACK:  No CVA tenderness CHEST:  Well healed sternotomy scar. HEART:  PMI not displaced or sustained,S1 and S2 within normal limits, no S3, no S4, no clicks, no rubs, no murmurs ABD:  Flat, positive bowel sounds normal in frequency in pitch, no bruits, no rebound, no guarding, no midline pulsatile mass, no hepatomegaly, no splenomegaly EXT:  2 plus pulses upper, absent DP/PT right and decreased on the left, no edema, no cyanosis no clubbing   ASSESSMENT AND PLAN  CAD (coronary artery disease) of artery bypass graft -  The patient is doing well. He had a stress test last year. No further cardiovascular testing is suggested. He will continue with risk reduction. He can reduce his aspirin back to 81 mg daily.   Hypertension -  His blood pressure is well controlled. He will continue the meds as listed.  Hyperlipidemia -  I will defer to his primary provider with a goal LDL less than 70 and HDL greater than 50.

## 2014-04-26 NOTE — Telephone Encounter (Signed)
Form finished Daughter-n-law aware. We will mail in AM

## 2014-04-30 ENCOUNTER — Ambulatory Visit (HOSPITAL_COMMUNITY)
Admission: RE | Admit: 2014-04-30 | Discharge: 2014-04-30 | Disposition: A | Discharge status: Home / Self Care | Payer: Medicare Other | Source: Ambulatory Visit | Attending: Vascular Surgery | Admitting: Vascular Surgery

## 2014-04-30 ENCOUNTER — Other Ambulatory Visit: Payer: Self-pay | Admitting: Family Medicine

## 2014-04-30 ENCOUNTER — Telehealth: Payer: Self-pay | Admitting: Family Medicine

## 2014-04-30 DIAGNOSIS — Z48812 Encounter for surgical aftercare following surgery on the circulatory system: Secondary | ICD-10-CM | POA: Diagnosis not present

## 2014-04-30 DIAGNOSIS — I6523 Occlusion and stenosis of bilateral carotid arteries: Secondary | ICD-10-CM | POA: Insufficient documentation

## 2014-04-30 NOTE — Telephone Encounter (Signed)
Pt given appt tomorrow at 4 with Dr.Miller.

## 2014-05-01 ENCOUNTER — Ambulatory Visit (INDEPENDENT_AMBULATORY_CARE_PROVIDER_SITE_OTHER): Payer: Medicare Other | Admitting: Family Medicine

## 2014-05-01 ENCOUNTER — Encounter: Payer: Self-pay | Admitting: Vascular Surgery

## 2014-05-01 ENCOUNTER — Encounter: Payer: Self-pay | Admitting: Family Medicine

## 2014-05-01 VITALS — BP 140/77 | HR 99 | Temp 97.8°F | Ht 71.0 in | Wt 212.0 lb

## 2014-05-01 DIAGNOSIS — I10 Essential (primary) hypertension: Secondary | ICD-10-CM | POA: Diagnosis not present

## 2014-05-01 DIAGNOSIS — I6523 Occlusion and stenosis of bilateral carotid arteries: Secondary | ICD-10-CM | POA: Diagnosis not present

## 2014-05-01 DIAGNOSIS — F0391 Unspecified dementia with behavioral disturbance: Secondary | ICD-10-CM

## 2014-05-01 DIAGNOSIS — F03918 Unspecified dementia, unspecified severity, with other behavioral disturbance: Secondary | ICD-10-CM

## 2014-05-01 MED ORDER — ALPRAZOLAM 0.5 MG PO TABS
0.5000 mg | ORAL_TABLET | Freq: Three times a day (TID) | ORAL | Status: DC | PRN
Start: 2014-05-01 — End: 2014-05-07

## 2014-05-01 MED ORDER — HYDROCODONE-HOMATROPINE 5-1.5 MG/5ML PO SYRP
5.0000 mL | ORAL_SOLUTION | Freq: Three times a day (TID) | ORAL | Status: DC | PRN
Start: 2014-05-01 — End: 2014-05-07

## 2014-05-01 MED ORDER — MEMANTINE HCL 10 MG PO TABS: 10.0000 mg | ORAL_TABLET | Freq: Two times a day (BID) | ORAL | Status: DC

## 2014-05-01 NOTE — Progress Notes (Signed)
Subjective:    Patient ID: [name], male    DOB: February 19, 1932, 81 y.o.   MRN: [id number]  HPI 20  81 year old gentleman who is here to follow-up dementia. Since his last visit here he was hospitalized for cough and folks at the hospital recommended more outpatient tests and evaluation. I have tried to explain to the daughter-in-law who accompanies him usually that sometimes with dementia, less is best, but I'm afraid this message is not that well understood or received yet.  He saw his cardiologist yesterday who suggested to the family that he may be having some panic disorder. In my experience panic disorder is not as likely in the elderly with dementia. It has been a challenge to try to adjust his anxiolytics along with behavior modifiers. He has had some wandering risk been found outside. I think it is more and more difficult for the family to care for him at home although that is the desire and we and they have been trying to get some help but at this time they have not identified a a caregiver.  In addition his seems like he is starting to have some problems with dysphagia as he is choking more. There is been no history of aspiration but in talking about this with family soft diet was suggested.  Patient Active Problem List   Diagnosis Date Noted  . Diabetes mellitus due to underlying condition without complications   . COPD exacerbation 04/11/2014  . Pain in the chest   . Dyspnea 04/10/2014  . Chest pain 04/10/2014  . Chronic cough 04/10/2014  . Essential hypertension 04/10/2014  . Carotid artery occlusion 04/10/2014  . CVA (cerebral vascular accident) 04/10/2014  . Acute bronchospasm 04/10/2014  . Anemia 04/10/2014  . Dementia with behavioral disturbance 11/15/2013  . Cough 11/15/2013  . Benign paroxysmal positional vertigo 01/30/2013  . Adjustment reaction of adult life 01/30/2013  . Need for prophylactic vaccination and inoculation against influenza 12/09/2012  .  Calcaneal fracture 09/09/2012  . Seizure 05/14/2011  . Hypotension, unspecified 05/14/2011  . Seizures 05/14/2011  . Preop cardiovascular exam 03/17/2011  . Diabetes mellitus   . CAD (coronary artery disease) of artery bypass graft   . Hyperlipidemia   . Hypertension   . Peripheral vascular disease   . Occlusion and stenosis of carotid artery without mention of cerebral infarction 02/11/2011   Outpatient Encounter Prescriptions as of 05/01/2014  Medication Sig  . ACCU-CHEK AVIVA PLUS test strip TEST TWICE A DAY  . aspirin EC 81 MG EC tablet Take 1 tablet (81 mg total) by mouth 2 (two) times daily.  . Calcium Carb-Cholecalciferol (CALCIUM 600+D3) 600-800 MG-UNIT TABS Take 1 tablet by mouth 2 (two) times daily.  . clopidogrel (PLAVIX) 75 MG tablet Take 1 tablet (75 mg total) by mouth daily.  Marland Kitchen glyBURIDE (DIABETA) 2.5 MG tablet Take 1 tablet (2.5 mg total) by mouth 2 (two) times daily.  Marland Kitchen HYDROcodone-homatropine (HYCODAN) 5-1.5 MG/5ML syrup Take 5 mLs by mouth every 8 (eight) hours as needed for cough.  Marland Kitchen ipratropium (ATROVENT) 0.02 % nebulizer solution Take 2.5 mLs (0.5 mg total) by nebulization 3 (three) times daily. Take for 5 days, then may use every 6 hours as needed.  . iron polysaccharides (NIFEREX) 150 MG capsule Take 1 capsule (150 mg total) by mouth 2 (two) times daily.  Marland Kitchen levalbuterol (XOPENEX) 0.63 MG/3ML nebulizer solution Take 3 mLs (0.63 mg total) by nebulization 3 (three) times daily. Use 3 times daily for 5 days,  then use every 4 hours as needed for wheezing.  Marland Kitchen. LORazepam (ATIVAN) 0.5 MG tablet TAKE ONE TABLET BY MOUTH DAILY AS NEEDED FOR ANXIETY  . memantine (NAMENDA TITRATION PACK) tablet pack Take 21 mg by mouth See admin instructions. 5 mg/day for =1 week; 5 mg twice daily for =1 week; 15 mg/day given in 5 mg and 10 mg separated doses for =1 week; then 10 mg twice daily  . metoprolol (LOPRESSOR) 50 MG tablet Take 25 mg by mouth 2 (two) times daily.   . Multiple Vitamin  (MULITIVITAMIN WITH MINERALS) TABS Take 1 tablet by mouth daily.  . niacin (NIASPAN) 1000 MG CR tablet Take 1 tablet (1,000 mg total) by mouth at bedtime.  . pantoprazole (PROTONIX) 40 MG tablet Take 1 tablet (40 mg total) by mouth daily.  . QUEtiapine (SEROQUEL) 50 MG tablet Take one tablet by mouth in AM and two tablets at bedtime (Patient taking differently: Take 50-100 mg by mouth 2 (two) times daily. Take one tablet by mouth in AM and two tablets at bedtime)  . ramipril (ALTACE) 2.5 MG capsule Take 2.5 mg by mouth daily.  . [DISCONTINUED] donepezil (ARICEPT) 10 MG tablet TAKE 1 TABLET AT BEDTIME     Review of Systems  Respiratory: Positive for cough.   Psychiatric/Behavioral: Positive for behavioral problems, confusion and sleep disturbance.       Objective:   Physical Exam  Constitutional: He appears well-developed and well-nourished.  HENT:  Head: Normocephalic.  Pulmonary/Chest: Effort normal and breath sounds normal.  Neurological: He is alert.  Oriented to person and place  Psychiatric:  Judgment and thought content seemed diminished    BP 140/77 mmHg  Pulse 99  Temp(Src) 97.8 F (36.6 C) (Oral)  Ht 5\' 11"  (1.803 m)  Wt 212 lb (96.163 kg)  BMI 29.58 kg/m2        Assessment & Plan:  1. Dementia with behavioral disturbance Continue with Namenda. May increase Seroquel to 350 mg tablets at night. Also will try substituting Xanax for Ativan to see if that helps his anxiety. Have stressed the need to keep him awake in the daytime so he'll rest better at night. Have also suggested soft foods to prevent aspiration problems  2. Essential hypertension Hospitalists discontinued the Ramipril. He doe continue with metoprolol   Frederica KusterStephen M Miller MD

## 2014-05-02 ENCOUNTER — Encounter: Payer: Self-pay | Admitting: Vascular Surgery

## 2014-05-02 ENCOUNTER — Ambulatory Visit (INDEPENDENT_AMBULATORY_CARE_PROVIDER_SITE_OTHER): Payer: Medicare Other | Admitting: Vascular Surgery

## 2014-05-02 VITALS — BP 151/61 | HR 95 | Resp 18 | Ht 70.5 in | Wt 214.2 lb

## 2014-05-02 DIAGNOSIS — I6523 Occlusion and stenosis of bilateral carotid arteries: Secondary | ICD-10-CM

## 2014-05-02 DIAGNOSIS — I739 Peripheral vascular disease, unspecified: Secondary | ICD-10-CM

## 2014-05-02 DIAGNOSIS — Z48812 Encounter for surgical aftercare following surgery on the circulatory system: Secondary | ICD-10-CM

## 2014-05-02 DIAGNOSIS — I779 Disorder of arteries and arterioles, unspecified: Secondary | ICD-10-CM | POA: Diagnosis not present

## 2014-05-02 NOTE — Patient Instructions (Signed)
Carotid Artery Disease  The carotid arteries are arteries on both sides of the neck. They carry blood to the brain. Carotid artery disease is when the arteries get smaller (narrow) or get blocked. If these arteries get smaller or get blocked, you are more likely to have a stroke or warning stroke (transient ischemic attack).  HOME CARE  Take medicines as told by your doctor. Make sure you understand all your medicine instructions. Do not stop your medicines without talking to your doctor first.  Follow your doctor's diet instructions. It is important to eat a healthy diet that includes plenty of:  Fresh fruits.  Vegetables.  Lean meats.  Avoid:  High-fat foods.  High-sodium foods.  Foods that are fried, overly processed, or have poor nutritional value.  Stay a healthy weight.  Stay active. Get at least 30 minutes of activity every day.  Do not smoke.  Limit alcohol use to:  No more than 2 drinks a day for men.  No more than 1 drink a day for women who are not pregnant.  Do not use illegal drugs.  Keep all doctor visits as told. GET HELP RIGHT AWAY IF:   You have sudden weakness or loss of feeling (numbness) on one side of the body, such as the face, arm, or leg.  You have sudden confusion.  You have trouble speaking (aphasia) or understanding.  You have sudden trouble seeing out of one or both eyes.  You have sudden trouble walking.  You have dizziness or feel like you might pass out (faint).  You have a loss of balance or your movements are not steady (uncoordinated).  You have a sudden, severe headache with no known cause.  You have trouble swallowing (dysphagia). Call your local emergency services (911 in [place] Do notdrive yourself to the clinic or hospital.  Document Released: 02/03/2012 Document Revised: 10/19/2012 Document Reviewed: 08/17/2012 [employer] Patient Information 2015 [employer]. This information is not intended to replace advice given  to you by your health care provider. Make sure you discuss any questions you have with your health care provider.  

## 2014-05-02 NOTE — Progress Notes (Signed)
    Established Carotid Patient  History of Present Illness  [name] JarvisSylvester H [name] is a 81 y.o. (1931/10/12) male who presents for follow-up s/p bilateral carotid endartectomies in 2013. The patient has not had amaurosis fugax or monocular blindness.  The patient has not had facial drooping or hemiplegia.  The patient has not had receptive or expressive aphasia.    He complains of a two week history of left neck pain and one month history of diffuse headache. He has been taking 400 mg of ibuprofen and that has provided some relief. He denies any trauma. He has been seeing his PCP, Dr. Hyacinth MeekerMiller and was advised to follow up with [name] sooner to rule out any vascular etiology to his symptoms. He is also being worked up by his cardiologist Dr. Antoine PocheHochrein.  He was diagnosed with dementia last summer (2015). He has been complaining of difficulty sleeping with increased agitation. His wife passed away in September 2015.    On ROS today: he complains of some shortness of breath which he says is chronic. He denies any chest pain.   He takes a baby aspirin twice daily. He is not currently on a statin. This was discontinued by his primary provider.   Physical Examination  Filed Vitals:   05/02/14 1306  BP: 151/61  Pulse: 95  Resp: 18  Height: 5' 10.5" (1.791 m)  Weight: 214 lb 3.2 oz (97.16 kg)   Body mass index is 30.29 kg/(m^2).  General: A&O x 3, WDWN male in NAD  Neck: Supple  Pulmonary: Sym exp, good air movt, CTAB, no rales, rhonchi, & wheezing  Cardiac: RRR, Nl S1, S2, no Murmurs, rubs or gallops, no carotid bruits.   Vascular: palpable radial pulses bilaterally. Non-palpable pedal pulses. Extremities without ischemic changes.   Musculoskeletal: M/S 5/5 throughout.   Neurologic: CN 2-12 grossly intact.  Pain and light touch intact in extremities. Motor exam as listed above  Non-Invasive Vascular Imaging  CAROTID DUPLEX (Date: 04/27/2014):   Patent right carotid endarterectomy site  with evidence of mild hyperplasia at proximal patch  Patent lift carotid endarterectomy site with no evidence of hyperplasia or restenosis  Vertebral arteries bilaterally are patent with antegrade flow    Medical Decision Making  [name] JarvisSylvester H Mccombs is a 81 y.o. male who presents is s/p bilateral carotid endartectomies in 2013. On carotid duplex, his endarterectomy sites are patent without any significant changes from his duplex one year ago. He remains asymptomatic without any signs or symptoms of TIA or stroke. His left neck pain and bilateral headaches are unlikely related to his carotid disease. Discussed that his neck pain is likely musculoskeletal and not related to his previous surgery. Advised patient to follow up with PCP who may refer to neurology if his headaches do not improve. He is on maximal medical management with aspirin. He is not currently on a statin. This has been discontinued by his primary provider. He will follow up in one year with repeat carotid duplex studies.   [name], PA-C [facility] Office: [phone] Pager: [phone]  05/02/2014, 1:46 PM  This patient was seen in conjunction with Dr. Edilia Boickson

## 2014-05-02 NOTE — Addendum Note (Signed)
Addended by: Adria DillELDRIDGE-LEWIS, Cha Gomillion L on: 05/02/2014 02:55 PM   Modules accepted: Orders

## 2014-05-03 ENCOUNTER — Encounter: Payer: Self-pay | Admitting: Cardiology

## 2014-05-03 ENCOUNTER — Other Ambulatory Visit: Payer: Self-pay | Admitting: *Deleted

## 2014-05-06 ENCOUNTER — Emergency Department (HOSPITAL_COMMUNITY): Payer: Medicare Other

## 2014-05-06 ENCOUNTER — Encounter (HOSPITAL_COMMUNITY): Payer: Self-pay | Admitting: Emergency Medicine

## 2014-05-06 ENCOUNTER — Inpatient Hospital Stay (HOSPITAL_COMMUNITY)
Admission: EM | Admit: 2014-05-06 | Discharge: 2014-05-07 | DRG: 689 | Disposition: A | Discharge status: Home Health | Payer: Medicare Other | Attending: Internal Medicine | Admitting: Internal Medicine

## 2014-05-06 DIAGNOSIS — K219 Gastro-esophageal reflux disease without esophagitis: Secondary | ICD-10-CM | POA: Diagnosis present

## 2014-05-06 DIAGNOSIS — R509 Fever, unspecified: Secondary | ICD-10-CM | POA: Diagnosis not present

## 2014-05-06 DIAGNOSIS — R05 Cough: Secondary | ICD-10-CM

## 2014-05-06 DIAGNOSIS — R059 Cough, unspecified: Secondary | ICD-10-CM | POA: Diagnosis present

## 2014-05-06 DIAGNOSIS — R569 Unspecified convulsions: Secondary | ICD-10-CM

## 2014-05-06 DIAGNOSIS — I2510 Atherosclerotic heart disease of native coronary artery without angina pectoris: Secondary | ICD-10-CM | POA: Diagnosis present

## 2014-05-06 DIAGNOSIS — J42 Unspecified chronic bronchitis: Secondary | ICD-10-CM | POA: Diagnosis present

## 2014-05-06 DIAGNOSIS — F0391 Unspecified dementia with behavioral disturbance: Secondary | ICD-10-CM | POA: Diagnosis present

## 2014-05-06 DIAGNOSIS — F03918 Unspecified dementia, unspecified severity, with other behavioral disturbance: Secondary | ICD-10-CM | POA: Diagnosis present

## 2014-05-06 DIAGNOSIS — E119 Type 2 diabetes mellitus without complications: Secondary | ICD-10-CM | POA: Diagnosis present

## 2014-05-06 DIAGNOSIS — Z822 Family history of deafness and hearing loss: Secondary | ICD-10-CM | POA: Diagnosis not present

## 2014-05-06 DIAGNOSIS — N289 Disorder of kidney and ureter, unspecified: Secondary | ICD-10-CM | POA: Diagnosis not present

## 2014-05-06 DIAGNOSIS — M10071 Idiopathic gout, right ankle and foot: Secondary | ICD-10-CM | POA: Diagnosis not present

## 2014-05-06 DIAGNOSIS — G9340 Encephalopathy, unspecified: Secondary | ICD-10-CM | POA: Diagnosis present

## 2014-05-06 DIAGNOSIS — E785 Hyperlipidemia, unspecified: Secondary | ICD-10-CM | POA: Diagnosis present

## 2014-05-06 DIAGNOSIS — I6529 Occlusion and stenosis of unspecified carotid artery: Secondary | ICD-10-CM | POA: Diagnosis present

## 2014-05-06 DIAGNOSIS — H9190 Unspecified hearing loss, unspecified ear: Secondary | ICD-10-CM | POA: Diagnosis present

## 2014-05-06 DIAGNOSIS — J9801 Acute bronchospasm: Secondary | ICD-10-CM | POA: Diagnosis present

## 2014-05-06 DIAGNOSIS — E860 Dehydration: Secondary | ICD-10-CM | POA: Diagnosis present

## 2014-05-06 DIAGNOSIS — I10 Essential (primary) hypertension: Secondary | ICD-10-CM | POA: Diagnosis present

## 2014-05-06 DIAGNOSIS — Z8673 Personal history of transient ischemic attack (TIA), and cerebral infarction without residual deficits: Secondary | ICD-10-CM | POA: Diagnosis not present

## 2014-05-06 DIAGNOSIS — Z87891 Personal history of nicotine dependence: Secondary | ICD-10-CM | POA: Diagnosis not present

## 2014-05-06 DIAGNOSIS — Z833 Family history of diabetes mellitus: Secondary | ICD-10-CM | POA: Diagnosis not present

## 2014-05-06 DIAGNOSIS — R053 Chronic cough: Secondary | ICD-10-CM | POA: Diagnosis present

## 2014-05-06 DIAGNOSIS — R531 Weakness: Secondary | ICD-10-CM | POA: Diagnosis present

## 2014-05-06 DIAGNOSIS — J9601 Acute respiratory failure with hypoxia: Secondary | ICD-10-CM | POA: Diagnosis present

## 2014-05-06 DIAGNOSIS — R627 Adult failure to thrive: Secondary | ICD-10-CM | POA: Diagnosis present

## 2014-05-06 DIAGNOSIS — R296 Repeated falls: Secondary | ICD-10-CM | POA: Diagnosis present

## 2014-05-06 DIAGNOSIS — N390 Urinary tract infection, site not specified: Secondary | ICD-10-CM | POA: Diagnosis present

## 2014-05-06 DIAGNOSIS — E089 Diabetes mellitus due to underlying condition without complications: Secondary | ICD-10-CM | POA: Diagnosis present

## 2014-05-06 DIAGNOSIS — M109 Gout, unspecified: Secondary | ICD-10-CM | POA: Diagnosis present

## 2014-05-06 DIAGNOSIS — M1990 Unspecified osteoarthritis, unspecified site: Secondary | ICD-10-CM | POA: Diagnosis present

## 2014-05-06 DIAGNOSIS — R0902 Hypoxemia: Secondary | ICD-10-CM

## 2014-05-06 DIAGNOSIS — I25810 Atherosclerosis of coronary artery bypass graft(s) without angina pectoris: Secondary | ICD-10-CM | POA: Diagnosis present

## 2014-05-06 HISTORY — DX: Headache: R51

## 2014-05-06 HISTORY — DX: Unspecified dementia with behavioral disturbance: F03.91

## 2014-05-06 HISTORY — DX: Unspecified dementia, unspecified severity, with other behavioral disturbance: F03.918

## 2014-05-06 HISTORY — DX: Dyspnea, unspecified: R06.00

## 2014-05-06 HISTORY — DX: Cervicalgia: M54.2

## 2014-05-06 HISTORY — DX: Other chronic pain: G89.29

## 2014-05-06 HISTORY — DX: Headache, unspecified: R51.9

## 2014-05-06 LAB — URIC ACID: Uric Acid, Serum: 5.8 mg/dL (ref 4.0–7.8)

## 2014-05-06 LAB — COMPREHENSIVE METABOLIC PANEL
ALBUMIN: 3.4 g/dL — AB (ref 3.5–5.2)
ALT: 20 U/L (ref 0–53)
AST: 23 U/L (ref 0–37)
Alkaline Phosphatase: 46 U/L (ref 39–117)
Anion gap: 11 (ref 5–15)
BUN: 23 mg/dL (ref 6–23)
CO2: 25 mmol/L (ref 19–32)
Calcium: 8.5 mg/dL (ref 8.4–10.5)
Chloride: 101 mmol/L (ref 96–112)
Creatinine, Ser: 1.48 mg/dL — ABNORMAL HIGH (ref 0.50–1.35)
GFR calc Af Amer: 50 mL/min — ABNORMAL LOW (ref >90)
GFR, EST NON AFRICAN AMERICAN: 43 mL/min — AB (ref >90)
Glucose, Bld: 117 mg/dL — ABNORMAL HIGH (ref 70–99)
POTASSIUM: 4 mmol/L (ref 3.5–5.1)
Sodium: 137 mmol/L (ref 135–145)
Total Bilirubin: 0.4 mg/dL (ref 0.3–1.2)
Total Protein: 7.2 g/dL (ref 6.0–8.3)

## 2014-05-06 LAB — CBC WITH DIFFERENTIAL/PLATELET
Band Neutrophils: 0 % (ref 0–10)
Basophils Absolute: 0 10*3/uL (ref 0.0–0.1)
Basophils Relative: 0 % (ref 0–1)
EOS PCT: 1 % (ref 0–5)
Eosinophils Absolute: 0.1 10*3/uL (ref 0.0–0.7)
HCT: 37.3 % — ABNORMAL LOW (ref 39.0–52.0)
Hemoglobin: 12.6 g/dL — ABNORMAL LOW (ref 13.0–17.0)
LYMPHS ABS: 1.7 10*3/uL (ref 0.7–4.0)
Lymphocytes Relative: 17 % (ref 12–46)
MCH: 32.8 pg (ref 26.0–34.0)
MCHC: 33.8 g/dL (ref 30.0–36.0)
MCV: 97.1 fL (ref 78.0–100.0)
MONO ABS: 0.9 10*3/uL (ref 0.1–1.0)
MONOS PCT: 9 % (ref 3–12)
NEUTROS PCT: 73 % (ref 43–77)
Neutro Abs: 7.1 10*3/uL (ref 1.7–7.7)
Platelets: 199 10*3/uL (ref 150–400)
RBC: 3.84 MIL/uL — ABNORMAL LOW (ref 4.22–5.81)
RDW: 13.2 % (ref 11.5–15.5)
WBC: 9.8 10*3/uL (ref 4.0–10.5)

## 2014-05-06 LAB — INFLUENZA PANEL BY PCR (TYPE A & B)
H1N1 flu by pcr: NOT DETECTED
INFLAPCR: NEGATIVE
Influenza B By PCR: NEGATIVE

## 2014-05-06 LAB — TROPONIN I: Troponin I: <0.03 ng/mL (ref <0.031)

## 2014-05-06 LAB — LACTIC ACID, PLASMA: Lactic Acid, Venous: 2 mmol/L (ref 0.5–2.0)

## 2014-05-06 MED ORDER — ONDANSETRON HCL 4 MG/2ML IJ SOLN: 4.0000 mg | Freq: Four times a day (QID) | INTRAMUSCULAR | Status: DC | PRN

## 2014-05-06 MED ORDER — PIPERACILLIN-TAZOBACTAM 3.375 G IVPB: 3.3750 g | Freq: Once | INTRAVENOUS | Status: DC

## 2014-05-06 MED ORDER — SODIUM CHLORIDE 0.9 % IV SOLN: INTRAVENOUS | Status: AC

## 2014-05-06 MED ORDER — IPRATROPIUM-ALBUTEROL 0.5-2.5 (3) MG/3ML IN SOLN
3.0000 mL | Freq: Once | RESPIRATORY_TRACT | Status: AC
  Administered 2014-05-06: 3 mL via RESPIRATORY_TRACT
  Filled 2014-05-06: qty 3

## 2014-05-06 MED ORDER — INSULIN ASPART 100 UNIT/ML ~~LOC~~ SOLN
0.0000 [IU] | Freq: Three times a day (TID) | SUBCUTANEOUS | Status: DC
  Administered 2014-05-07: 2 [IU] via SUBCUTANEOUS

## 2014-05-06 MED ORDER — PIPERACILLIN-TAZOBACTAM 3.375 G IVPB
INTRAVENOUS | Status: AC
  Filled 2014-05-06: qty 50

## 2014-05-06 MED ORDER — CLOPIDOGREL BISULFATE 75 MG PO TABS
75.0000 mg | ORAL_TABLET | Freq: Every day | ORAL | Status: DC
  Administered 2014-05-07: 75 mg via ORAL
  Filled 2014-05-06: qty 1

## 2014-05-06 MED ORDER — SODIUM CHLORIDE 0.9 % IV SOLN
INTRAVENOUS | Status: DC
  Administered 2014-05-06: 20:00:00 via INTRAVENOUS

## 2014-05-06 MED ORDER — SODIUM CHLORIDE 0.9 % IV SOLN: INTRAVENOUS | Status: DC

## 2014-05-06 MED ORDER — SODIUM CHLORIDE 0.9 % IV BOLUS (SEPSIS)
250.0000 mL | Freq: Once | INTRAVENOUS | Status: AC
  Administered 2014-05-06: 250 mL via INTRAVENOUS

## 2014-05-06 MED ORDER — PIPERACILLIN-TAZOBACTAM 3.375 G IVPB
3.3750 g | Freq: Three times a day (TID) | INTRAVENOUS | Status: DC
  Administered 2014-05-06 – 2014-05-07 (×2): 3.375 g via INTRAVENOUS
  Filled 2014-05-06 (×6): qty 50

## 2014-05-06 MED ORDER — SODIUM CHLORIDE 0.9 % IJ SOLN
3.0000 mL | Freq: Two times a day (BID) | INTRAMUSCULAR | Status: DC
  Administered 2014-05-07: 3 mL via INTRAVENOUS

## 2014-05-06 MED ORDER — ONDANSETRON HCL 4 MG PO TABS: 4.0000 mg | ORAL_TABLET | Freq: Four times a day (QID) | ORAL | Status: DC | PRN

## 2014-05-06 MED ORDER — VANCOMYCIN HCL 10 G IV SOLR
1500.0000 mg | INTRAVENOUS | Status: DC
  Administered 2014-05-06: 1500 mg via INTRAVENOUS
  Filled 2014-05-06 (×2): qty 1500

## 2014-05-06 MED ORDER — ALBUTEROL SULFATE (2.5 MG/3ML) 0.083% IN NEBU
2.5000 mg | INHALATION_SOLUTION | RESPIRATORY_TRACT | Status: AC | PRN
  Administered 2014-05-06: 2.5 mg via RESPIRATORY_TRACT
  Filled 2014-05-06: qty 3

## 2014-05-06 MED ORDER — DEXTROSE 5 % IV SOLN
INTRAVENOUS | Status: AC
  Filled 2014-05-06: qty 10

## 2014-05-06 MED ORDER — ACETAMINOPHEN 500 MG PO TABS
1000.0000 mg | ORAL_TABLET | Freq: Once | ORAL | Status: AC
  Administered 2014-05-06: 1000 mg via ORAL
  Filled 2014-05-06: qty 2

## 2014-05-06 MED ORDER — METOPROLOL TARTRATE 25 MG PO TABS
25.0000 mg | ORAL_TABLET | Freq: Every morning | ORAL | Status: DC
  Administered 2014-05-07: 25 mg via ORAL
  Filled 2014-05-06: qty 1

## 2014-05-06 MED ORDER — VANCOMYCIN HCL IN DEXTROSE 1-5 GM/200ML-% IV SOLN: 1000.0000 mg | Freq: Once | INTRAVENOUS | Status: DC

## 2014-05-06 MED ORDER — ACETAMINOPHEN 650 MG RE SUPP: 650.0000 mg | Freq: Four times a day (QID) | RECTAL | Status: DC | PRN

## 2014-05-06 MED ORDER — IPRATROPIUM BROMIDE 0.02 % IN SOLN
0.5000 mg | Freq: Three times a day (TID) | RESPIRATORY_TRACT | Status: DC
  Administered 2014-05-06 – 2014-05-07 (×2): 0.5 mg via RESPIRATORY_TRACT
  Filled 2014-05-06 (×2): qty 2.5

## 2014-05-06 MED ORDER — ASPIRIN EC 81 MG PO TBEC
81.0000 mg | DELAYED_RELEASE_TABLET | Freq: Two times a day (BID) | ORAL | Status: DC
  Administered 2014-05-07: 81 mg via ORAL
  Filled 2014-05-06: qty 1

## 2014-05-06 MED ORDER — ENOXAPARIN SODIUM 40 MG/0.4ML ~~LOC~~ SOLN
40.0000 mg | SUBCUTANEOUS | Status: DC
  Administered 2014-05-07: 40 mg via SUBCUTANEOUS
  Filled 2014-05-06: qty 0.4

## 2014-05-06 MED ORDER — ACETAMINOPHEN 325 MG PO TABS
650.0000 mg | ORAL_TABLET | Freq: Four times a day (QID) | ORAL | Status: DC | PRN
  Administered 2014-05-07: 650 mg via ORAL
  Filled 2014-05-06: qty 2

## 2014-05-06 NOTE — ED Notes (Signed)
[facility] and was sent here for further eval d/t altered mental status increasing x1 week. Family increased in weakness and frequent falls and new medication of xanax this past week. PT c/o dry hacking cough the past week.

## 2014-05-06 NOTE — H&P (Signed)
PCP:   Frederica Kuster, MD   Chief Complaint:  Not feeling well  HPI: 81 yo male h/o dementia, dm, htn, hld, CAD, one seizure comes in with family for worsening weakness and frequent falls for over a month.  Has been coughing more than normal.  Also complains of dysuria for several days and pain in his right toe for 4 days.  Not been eating or drinking well.  No fevers, but has temp here over 101.  No n/v/d.  No swelling in his legs.  Most history obtained from EDP, reported that pt was confused but right now is alert and oriented to person, time and place and answers questions appropriately.  Denies any pain, but dysuria sometimes and worsening cough and rt toe pain.   Review of Systems:  Positive and negative as per HPI otherwise all other systems are negative  Past Medical History: Past Medical History  Diagnosis Date  . Diabetes mellitus type 2   . GERD (gastroesophageal reflux disease)   . Hyperlipidemia     takes Niacin nightly  . DJD (degenerative joint disease)   . Hypertension     takes Metoprolol and Ramipril daily  . Dizziness   . Gout     hx of--2yrs ago  . Bruises easily     pt is on Plavix and ASA  . Shortness of breath     wakes pt up during night with occ shortness of breathe;states it happens about once a month  . Impaired hearing   . Urinary frequency   . UTI (lower urinary tract infection)     hx of--03/2010  . Anemia     takes Glyburide bid  . CAD (coronary artery disease) of artery bypass graft   . Pneumonia     "at least twice that I know of"  . Seizures 05/14/11    "first time was today"  . CVA (cerebral vascular accident) ~ 2009    right arm/leg occ hurts  . Carotid artery occlusion 2013    s/p bilateral endarterectomy  . Chronic headache   . Chronic neck pain   . Dementia with behavioral disturbance   . Dyspnea     chronic, daily   Past Surgical History  Procedure Laterality Date  . Appendectomy  1968  . Colonoscopy    . Endarterectomy   04/09/2011    Procedure: ENDARTERECTOMY CAROTID;  Surgeon: Chuck Hint, MD;  Location: [facility] OR;  Service: Vascular;  Laterality: Left;  Left Carotid endarterectomy With Dacron Patch Angioplasty  . Cardiac catheterization  1998    Dr. Antoine Poche  . Endarterectomy  05/08/2011    Procedure: ENDARTERECTOMY CAROTID;  Surgeon: Chuck Hint, MD;  Location: [facility] OR;  Service: Vascular;  Laterality: Right;  with [place] Patch Angioplasty  . Cataract extraction w/ intraocular lens  implant, bilateral  2012  . Coronary artery bypass graft  1998    LIMA to LAD, SVG to diagonal, SVG to PDA, SVG to posterior lateral.   . Eye surgery  1953    right ; "piece of metal removed"  . Eye surgery  2012    Cataract- Bilateral    Medications: Prior to Admission medications   Medication Sig Start Date End Date Taking? Authorizing Provider  ALPRAZolam Prudy Feeler) 0.5 MG tablet Take 1 tablet (0.5 mg total) by mouth 3 (three) times daily as needed for anxiety. 05/01/14  Yes Frederica Kuster, MD  aspirin EC 81 MG EC tablet Take 1 tablet (81 mg  total) by mouth 2 (two) times daily. 03/21/12  Yes Rollene Rotunda, MD  clopidogrel (PLAVIX) 75 MG tablet Take 1 tablet (75 mg total) by mouth daily. 12/21/13  Yes Rollene Rotunda, MD  glyBURIDE (DIABETA) 2.5 MG tablet Take 1 tablet (2.5 mg total) by mouth 2 (two) times daily. 03/01/13  Yes Ileana Ladd, MD  ipratropium (ATROVENT) 0.02 % nebulizer solution Take 2.5 mLs (0.5 mg total) by nebulization 3 (three) times daily. Take for 5 days, then may use every 6 hours as needed. 04/12/14  Yes Rodolph Bong, MD  memantine (NAMENDA XR) 28 MG CP24 24 hr capsule Take 28 mg by mouth every morning.   Yes Historical Provider, MD  metoprolol (LOPRESSOR) 50 MG tablet Take 25 mg by mouth every morning.    Yes Historical Provider, MD  QUEtiapine (SEROQUEL) 50 MG tablet Take one tablet by mouth in AM and two tablets at bedtime Patient taking differently: Take 100 mg by mouth at  bedtime.  02/08/14  Yes Frederica Kuster, MD  ACCU-CHEK AVIVA PLUS test strip TEST TWICE A DAY Patient not taking: Reported on 05/02/2014 03/16/13   Ileana Ladd, MD  HYDROcodone-homatropine [facility]) 5-1.5 MG/5ML syrup Take 5 mLs by mouth every 8 (eight) hours as needed for cough. Patient not taking: Reported on 05/02/2014 05/01/14   Frederica Kuster, MD  iron polysaccharides (NIFEREX) 150 MG capsule Take 1 capsule (150 mg total) by mouth 2 (two) times daily. Patient not taking: Reported on 05/02/2014 04/12/14   Rodolph Bong, MD  levalbuterol Pauline Aus) 0.63 MG/3ML nebulizer solution Take 3 mLs (0.63 mg total) by nebulization 3 (three) times daily. Use 3 times daily for 5 days, then use every 4 hours as needed for wheezing. Patient not taking: Reported on 05/02/2014 04/12/14   Rodolph Bong, MD  niacin (NIASPAN) 1000 MG CR tablet Take 1 tablet (1,000 mg total) by mouth at bedtime. Patient not taking: Reported on 05/02/2014 03/01/13   Ileana Ladd, MD  pantoprazole (PROTONIX) 40 MG tablet Take 1 tablet (40 mg total) by mouth daily. Patient not taking: Reported on 05/02/2014 04/12/14   Rodolph Bong, MD    Allergies:   Allergies  Allergen Reactions  . Fexofenadine Hcl Other (See Comments)    Heart attack symptoms  . Flonase [Fluticasone Propionate] Other (See Comments)    Heart attack symptoms  . Nasal Spray Other (See Comments)    Heart attack symptoms   . Nasal Spray     CAN NOT TOLERATE ANY NASAL SPRAYS!!!!!  Causes heart attack symptoms    Social History:  reports that he quit smoking about 40 years ago. His smoking use included Cigarettes. He has a 20 pack-year smoking history. He has never used smokeless tobacco. He reports that he does not drink alcohol or use illicit drugs.  Family History: Family History  Problem Relation Age of Onset  . Diabetes Sister   . Cancer Brother   . Diabetes Brother   . Anesthesia problems Neg Hx   . Hypotension Neg Hx   . Malignant  hyperthermia Neg Hx   . Pseudochol deficiency Neg Hx   . Hearing loss Father     Physical Exam: Filed Vitals:   05/06/14 2015 05/06/14 2030 05/06/14 2045 05/06/14 2100  BP:  139/84  115/60  Pulse:    100  Temp:      TempSrc:      Resp: 21 19 23 15   Height:      Weight:  SpO2:    97%   General appearance: alert, cooperative and no distress Head: Normocephalic, without obvious abnormality, atraumatic Eyes: negative Nose: Nares normal. Septum midline. Mucosa normal. No drainage or sinus tenderness. Neck: no JVD and supple, symmetrical, trachea midline Lungs: clear to auscultation bilaterally Heart: regular rate and rhythm, S1, S2 normal, no murmur, click, rub or gallop Abdomen: soft, non-tender; bowel sounds normal; no masses,  no organomegaly Extremities: extremities normal, atraumatic, no cyanosis or edema Pulses: 2+ and symmetric Skin: Skin color, texture, turgor normal. No rashes or lesions Neurologic: Grossly normal  Labs on Admission:  No results for input(s): NA, K, CL, CO2, GLUCOSE, BUN, CREATININE, CALCIUM, MG, PHOS in the last 72 hours. No results for input(s): AST, ALT, ALKPHOS, BILITOT, PROT, ALBUMIN in the last 72 hours. No results for input(s): LIPASE, AMYLASE in the last 72 hours. No results for input(s): WBC, NEUTROABS, HGB, HCT, MCV, PLT in the last 72 hours. No results for input(s): CKTOTAL, CKMB, CKMBINDEX, TROPONINI in the last 72 hours. No results for input(s): TSH, T4TOTAL, T3FREE, THYROIDAB in the last 72 hours.  Invalid input(s): FREET3 No results for input(s): VITAMINB12, FOLATE, FERRITIN, TIBC, IRON, RETICCTPCT in the last 72 hours.  Labs reviewed, cr 1.4 trop neg  Wbc nml hbg over 12 lactic 2 flu neg ua bacteria uric acid pending ekg sinus tach no acute issues.  Radiological Exams on Admission: Ct Head Wo Contrast  05/06/2014   CLINICAL DATA:  Altered mental status  EXAM: CT HEAD WITHOUT CONTRAST  TECHNIQUE: Contiguous axial images were  obtained from the base of the skull through the vertex without intravenous contrast.  COMPARISON:  CT 05/14/2011  FINDINGS: Generalized atrophy without hydrocephalus. Mild progression of atrophy.  Chronic microvascular ischemic change in the white matter. Chronic infarct in the right posterior basal ganglia is unchanged. Small chronic infarct left cerebellum.  Negative for acute infarct.  Negative for hemorrhage or mass.  Calvarium intact.  Atherosclerotic calcification.  IMPRESSION: Atrophy and chronic microvascular ischemia.  No acute abnormality.   Electronically Signed   By: Marlan Palau M.D.   On: 05/06/2014 20:30   Ct Angio Chest Pe W/cm &/or Wo Cm  04/10/2014   CLINICAL DATA:  Dyspnea since 1500 hr. Chest pain, cough, gagging and fever.  EXAM: CT ANGIOGRAPHY CHEST WITH CONTRAST  TECHNIQUE: Multidetector CT imaging of the chest was performed using the standard protocol during bolus administration of intravenous contrast. Multiplanar CT image reconstructions and MIPs were obtained to evaluate the vascular anatomy.  CONTRAST:  OMNIPAQUE IOHEXOL 350 MG/ML SOLN  COMPARISON:  None.  FINDINGS: Technically adequate study with moderately good opacification of the central and segmental pulmonary arteries. No focal filling defects demonstrated. No evidence of significant pulmonary embolus.  Postoperative changes in the mediastinum consistent with previous bypass graft. Normal heart size. Normal caliber thoracic aorta with calcification and atherosclerotic changes. Great vessel origins are patent. No significant lymphadenopathy in the chest. Esophagus is decompressed. Small esophageal hiatal hernia.  Emphysematous changes in the lungs. No focal airspace disease or consolidation. No pneumothorax. No pleural effusions. Calcified granuloma in the right lung. Airways appear patent.  Included portions of the upper abdominal organs demonstrate fatty infiltration in the liver. Degenerative changes in the spine.   Review of the MIP images confirms the above findings.  IMPRESSION: No evidence of significant pulmonary embolus. No evidence of active pulmonary disease.   Electronically Signed   By: Burman Nieves M.D.   On: 04/10/2014 22:31   Dg  Chest Portable 1 View  05/06/2014   CLINICAL DATA:  Dry hacking cough for 1 week. Frequent falls. Diabetes. Altered mental status.  EXAM: PORTABLE CHEST - 1 VIEW  COMPARISON:  05/06/2014  FINDINGS: Prior CABG. Low lung volumes. Given the low lung volumes and AP projection, heart size is considered within normal limits.  Airway thickening is present, suggesting bronchitis or reactive airways disease.  IMPRESSION: 1. Airway thickening is present, suggesting bronchitis or reactive airways disease. 2. Prior CABG.   Electronically Signed   By: Gaylyn RongWalter  Liebkemann M.D.   On: 05/06/2014 19:10   Dg Chest Portable 1 View  04/10/2014   CLINICAL DATA:  Shortness of breath, wheezing, chest pain, cough since October  EXAM: PORTABLE CHEST - 1 VIEW  COMPARISON:  Earlier films of the same day  FINDINGS: Previous CABG. Heart size upper limits normal. Lungs are clear. No pneumothorax. No effusion.  IMPRESSION: No acute disease post CABG.   Electronically Signed   By: Corlis Leak  Hassell M.D.   On: 04/10/2014 21:32    Assessment/Plan  81 yo male with dementia, dehydration, uti and possible gouty flare   Principal Problem:   Encephalopathy acute-  Multifactorial, but appears normal at this time.  Could have been due to high fever in ED and at home.  Treat underlying infection and dehydration  Active Problems:   UTI (lower urinary tract infection)-  On vanc/zosyn for broad coverage as pulm may be a factor, cx pending   Dehydration-  ivf   Acute respiratory failure with hypoxia-  Will cover empirically for pna also, no wheezing at this time, cont nebs, hold steroids for now   CAD (coronary artery disease) of artery bypass graft-  stable   Dementia with behavioral disturbance-  stable   Cough-  As  above   Chronic cough   Carotid artery occlusion   Seizures-  Only one in past, sz precautions   Acute bronchospasm- reported mild wheezing in ED, resolved with alb neb.  Cont his home duoneb treatments, no actual formal dx of copd in epic.  Hold off on steroids if possible.   Diabetes mellitus due to underlying condition without complications   Acute renal insufficiency-  Ivf, monitor.   FTT (failure to thrive) in adult-  May  Need SNF  possible Gout flare-  Ck uric acid level, right toe painful to touch and move but not really swollen or red.  Admit to tele bed.  FULL CODE.  Melvin Marmo A 05/06/2014, 9:16 PM

## 2014-05-06 NOTE — ED Provider Notes (Signed)
CSN: [id number]     Arrival date & time 05/06/14  1818 History   First MD Initiated Contact with Patient 05/06/14 1853     Chief Complaint  Patient presents with  . Altered Mental Status      Patient is a 81 y.o. male presenting with altered mental status. The history is provided by a caregiver, a relative and the patient. The history is limited by the condition of the patient (Hx dementia).  Altered Mental Status Pt was seen at 1910. Per pt and his family: Pt with progressive generalized weakness/fatigue and increased confusion for the past 2 weeks. Pt was evaluated by his Cards MD on 04/25/14 for "feeling anxious," and "inability to read with need to stand up and walk around." It was thought pt was having "panic attacks" and was instructed to f/u with his PMD. Pt f/u with his PMD on 05/01/14 and was started on xanax. Pt was also evaluated by his Vascular MD on 05/02/14 and was told his behavior was not due to his previous endarterectomies in 2013. Pt has progressively become "more confused" and "will just fall asleep in a chair." Pt has been having frequent falls, and was "unable to stand or walk today" due to generalized weakness. Pt and family c/o "dry hacking cough" for the past week, as well as "redness to his right great toe." Denies fevers, no CP, no abd pain, no N/V/D. Family also states pt's dementia has been "getting worse" over the past 3 months, with pt "going outside without shoes," "talking out of his head," as well as "not sleeping" and having "increased agitation."    Past Medical History  Diagnosis Date  . Diabetes mellitus type 2   . GERD (gastroesophageal reflux disease)   . Hyperlipidemia     takes Niacin nightly  . DJD (degenerative joint disease)   . Hypertension     takes Metoprolol and Ramipril daily  . Dizziness   . Gout     hx of--46yrs ago  . Bruises easily     pt is on Plavix and ASA  . Shortness of breath     wakes pt up during night with occ shortness of  breathe;states it happens about once a month  . Impaired hearing   . Urinary frequency   . UTI (lower urinary tract infection)     hx of--03/2010  . Anemia     takes Glyburide bid  . CAD (coronary artery disease) of artery bypass graft   . Pneumonia     "at least twice that I know of"  . Seizures 05/14/11    "first time was today"  . CVA (cerebral vascular accident) ~ 2009    right arm/leg occ hurts  . Carotid artery occlusion 2013    s/p bilateral endarterectomy  . Chronic headache   . Chronic neck pain   . Dementia with behavioral disturbance   . Dyspnea     chronic, daily   Past Surgical History  Procedure Laterality Date  . Appendectomy  1968  . Colonoscopy    . Endarterectomy  04/09/2011    Procedure: ENDARTERECTOMY CAROTID;  Surgeon: Chuck Hint, MD;  Location: [facility] OR;  Service: Vascular;  Laterality: Left;  Left Carotid endarterectomy With Dacron Patch Angioplasty  . Cardiac catheterization  1998    Dr. Antoine Poche  . Endarterectomy  05/08/2011    Procedure: ENDARTERECTOMY CAROTID;  Surgeon: Chuck Hint, MD;  Location: [facility] OR;  Service: Vascular;  Laterality: Right;  with  [place] Patch Angioplasty  . Cataract extraction w/ intraocular lens  implant, bilateral  2012  . Coronary artery bypass graft  1998    LIMA to LAD, SVG to diagonal, SVG to PDA, SVG to posterior lateral.   . Eye surgery  1953    right ; "piece of metal removed"  . Eye surgery  2012    Cataract- Bilateral   Family History  Problem Relation Age of Onset  . Diabetes Sister   . Cancer Brother   . Diabetes Brother   . Anesthesia problems Neg Hx   . Hypotension Neg Hx   . Malignant hyperthermia Neg Hx   . Pseudochol deficiency Neg Hx   . Hearing loss Father    History  Substance Use Topics  . Smoking status: Former Smoker -- 1.00 packs/day for 20 years    Types: Cigarettes    Quit date: 03/02/1974  . Smokeless tobacco: Never Used  . Alcohol Use: No     Comment: quit in 1981     Review of Systems  Unable to perform ROS: Dementia      Allergies  Fexofenadine hcl; Flonase; Nasal spray; and Nasal spray  Home Medications   Prior to Admission medications   Medication Sig Start Date End Date Taking? Authorizing Provider  ALPRAZolam Prudy Feeler) 0.5 MG tablet Take 1 tablet (0.5 mg total) by mouth 3 (three) times daily as needed for anxiety. 05/01/14  Yes Frederica Kuster, MD  aspirin EC 81 MG EC tablet Take 1 tablet (81 mg total) by mouth 2 (two) times daily. 03/21/12  Yes Rollene Rotunda, MD  clopidogrel (PLAVIX) 75 MG tablet Take 1 tablet (75 mg total) by mouth daily. 12/21/13  Yes Rollene Rotunda, MD  glyBURIDE (DIABETA) 2.5 MG tablet Take 1 tablet (2.5 mg total) by mouth 2 (two) times daily. 03/01/13  Yes Ileana Ladd, MD  ipratropium (ATROVENT) 0.02 % nebulizer solution Take 2.5 mLs (0.5 mg total) by nebulization 3 (three) times daily. Take for 5 days, then may use every 6 hours as needed. 04/12/14  Yes Rodolph Bong, MD  memantine (NAMENDA XR) 28 MG CP24 24 hr capsule Take 28 mg by mouth every morning.   Yes Historical Provider, MD  metoprolol (LOPRESSOR) 50 MG tablet Take 25 mg by mouth every morning.    Yes Historical Provider, MD  QUEtiapine (SEROQUEL) 50 MG tablet Take one tablet by mouth in AM and two tablets at bedtime Patient taking differently: Take 100 mg by mouth at bedtime.  02/08/14  Yes Frederica Kuster, MD  ACCU-CHEK AVIVA PLUS test strip TEST TWICE A DAY Patient not taking: Reported on 05/02/2014 03/16/13   Ileana Ladd, MD  HYDROcodone-homatropine [facility]) 5-1.5 MG/5ML syrup Take 5 mLs by mouth every 8 (eight) hours as needed for cough. Patient not taking: Reported on 05/02/2014 05/01/14   Frederica Kuster, MD  iron polysaccharides (NIFEREX) 150 MG capsule Take 1 capsule (150 mg total) by mouth 2 (two) times daily. Patient not taking: Reported on 05/02/2014 04/12/14   Rodolph Bong, MD  levalbuterol Pauline Aus) 0.63 MG/3ML nebulizer solution Take 3 mLs  (0.63 mg total) by nebulization 3 (three) times daily. Use 3 times daily for 5 days, then use every 4 hours as needed for wheezing. Patient not taking: Reported on 05/02/2014 04/12/14   Rodolph Bong, MD  niacin (NIASPAN) 1000 MG CR tablet Take 1 tablet (1,000 mg total) by mouth at bedtime. Patient not taking: Reported on 05/02/2014 03/01/13  Ileana Ladd, MD  pantoprazole (PROTONIX) 40 MG tablet Take 1 tablet (40 mg total) by mouth daily. Patient not taking: Reported on 05/02/2014 04/12/14   Rodolph Bong, MD   BP 154/80 mmHg  Pulse 108  Temp(Src) 101.3 F (38.5 C) (Rectal)  Resp 29  Ht 5\' 6"  (1.676 m)  Wt 210 lb (95.255 kg)  BMI 33.91 kg/m2  SpO2 90%   19:04 Orthostatic Vital Signs SJ  Orthostatic Lying  - BP- Lying: 160/66 mmHg ; Pulse- Lying: 110  Orthostatic Sitting - BP- Sitting: 132/72 mmHg ; Pulse- Sitting: 110     Filed Vitals:   05/06/14 1945 05/06/14 2000 05/06/14 2015 05/06/14 2030  BP:  135/75  139/84  Pulse:  106    Temp:      TempSrc:      Resp: 29 20 21 19   Height:      Weight:      SpO2:  96%      Physical Exam  1915: Physical examination:  Nursing notes reviewed; Vital signs and O2 SAT reviewed; +febrile.;; Constitutional: Well developed, Well nourished, In no acute distress; Head:  Normocephalic, atraumatic; Eyes: EOMI, PERRL, No scleral icterus; ENMT: Mouth and pharynx normal, Mucous membranes dry; Neck: Supple, Full range of motion, No lymphadenopathy; Cardiovascular: Tachycardic rate and rhythm, No gallop; Respiratory: Breath sounds diminished, coarse & equal bilaterally, No wheezes. Speaking full sentences with ease, Normal respiratory effort/excursion; Chest: Nontender, Movement normal; Abdomen: Soft, Nontender, Nondistended, Normal bowel sounds; Genitourinary: No CVA tenderness; Extremities: Pulses normal, No deformity, No edema, No calf edema or asymmetry. +erythema and tenderness to right MTP area. No open wounds, no ecchymosis, no streaking.; Neuro:  Awake, alert, confused re: time, events. Major CN grossly intact. No facial droop. Speech clear. Moves all extremities spontaneously and to command without apparent gross focal motor deficits.; Skin: Color normal, Warm, Dry.   ED Course  Procedures     EKG Interpretation   Date/Time:  Sunday May 06 2014 18:27:22 EST Ventricular Rate:  115 PR Interval:  161 QRS Duration: 105 QT Interval:  333 QTC Calculation: 461 R Axis:   26 Text Interpretation:  Sinus tachycardia Low voltage, precordial leads  Nonspecific ST and T wave abnormality When compared with ECG of 04/10/2014  No significant change was found Confirmed by [facility]  MD, Nicholos Johns  [phone]) on 05/06/2014 7:35:22 PM      MDM  MDM Reviewed: previous chart, nursing note and vitals Reviewed previous: labs and ECG Interpretation: labs, ECG, x-ray and CT scan      Ct Head Wo Contrast 05/06/2014   CLINICAL DATA:  Altered mental status  EXAM: CT HEAD WITHOUT CONTRAST  TECHNIQUE: Contiguous axial images were obtained from the base of the skull through the vertex without intravenous contrast.  COMPARISON:  CT 05/14/2011  FINDINGS: Generalized atrophy without hydrocephalus. Mild progression of atrophy.  Chronic microvascular ischemic change in the white matter. Chronic infarct in the right posterior basal ganglia is unchanged. Small chronic infarct left cerebellum.  Negative for acute infarct.  Negative for hemorrhage or mass.  Calvarium intact.  Atherosclerotic calcification.  IMPRESSION: Atrophy and chronic microvascular ischemia.  No acute abnormality.   Electronically Signed   By: Marlan Palau M.D.   On: 05/06/2014 20:30    Dg Chest Portable 1 View 05/06/2014   CLINICAL DATA:  Dry hacking cough for 1 week. Frequent falls. Diabetes. Altered mental status.  EXAM: PORTABLE CHEST - 1 VIEW  COMPARISON:  05/06/2014  FINDINGS: Prior CABG.  Low lung volumes. Given the low lung volumes and AP projection, heart size is considered within normal  limits.  Airway thickening is present, suggesting bronchitis or reactive airways disease.  IMPRESSION: 1. Airway thickening is present, suggesting bronchitis or reactive airways disease. 2. Prior CABG.   Electronically Signed   By: Gaylyn RongWalter  Liebkemann M.D.   On: 05/06/2014 19:10     2100:  Pt unable to stand for orthostatic VS due to generalized weakness; family states this is new as of today. APAP given for fever. O2 2L N/C applied for Sats 90-92% R/A on arrival with improvement to 96-99%; neb ordered. Labs not crossing over in the computer: BUN/Cr elevated from baseline, Udip with 3-6 WBC and few bacteria on micro.  IVF continues.  Influenza panel and UC is pending. Dx and testing d/w pt and family.  Questions answered.  Verb understanding, agreeable to admit.  T/C to [facility] Dr. Onalee Huaavid, case discussed, including:  HPI, pertinent PM/SHx, VS/PE, dx testing, ED course and treatment:  Agreeable to admit, requests to write temporary orders, obtain inpt medical bed to team APAdmits.    Samuel JesterKathleen Augustine Leverette, DO 05/08/14 [phone]

## 2014-05-06 NOTE — Progress Notes (Signed)
ANTIBIOTIC CONSULT NOTE  Pharmacy Consult for Vancomycin & Zosyn Indication: fever of unknown origin  Allergies  Allergen Reactions  . Fexofenadine Hcl Other (See Comments)    Heart attack symptoms  . Flonase [Fluticasone Propionate] Other (See Comments)    Heart attack symptoms  . Nasal Spray Other (See Comments)    Heart attack symptoms   . Nasal Spray     CAN NOT TOLERATE ANY NASAL SPRAYS!!!!!  Causes heart attack symptoms    Patient Measurements: Height: 5\' 6"  (167.6 cm) Weight: 206 lb 9.6 oz (93.713 kg) IBW/kg (Calculated) : 63.8  Vital Signs: Temp: 98.7 F (37.1 C) (03/06 2205) Temp Source: Oral (03/06 2205) BP: 148/57 mmHg (03/06 2205) Pulse Rate: 95 (03/06 2230) Intake/Output from previous day:   Intake/Output from this shift:    Labs:  Recent Labs  05/06/14 1922  WBC 9.8  HGB 12.6*  PLT 199  CREATININE 1.48*   Estimated Creatinine Clearance: 42.7 mL/min (by C-G formula based on Cr of 1.48). No results for input(s): VANCOTROUGH, VANCOPEAK, VANCORANDOM, GENTTROUGH, GENTPEAK, GENTRANDOM, TOBRATROUGH, TOBRAPEAK, TOBRARND, AMIKACINPEAK, AMIKACINTROU, AMIKACIN in the last 72 hours.   Microbiology: No results found for this or any previous visit (from the past 720 hour(s)).  Anti-infectives    Start     Dose/Rate Route Frequency Ordered Stop   05/06/14 2130  piperacillin-tazobactam (ZOSYN) IVPB 3.375 g     3.375 g 12.5 mL/hr over 240 Minutes Intravenous  Once 05/06/14 2125     05/06/14 2130  vancomycin (VANCOCIN) IVPB 1000 mg/200 mL premix     1,000 mg 200 mL/hr over 60 Minutes Intravenous  Once 05/06/14 2125     05/06/14 2007  dextrose 5 % with cefTRIAXone (ROCEPHIN) ADS Med  Status:  Discontinued    Comments:  Pruitt, Ginger   : cabinet override      05/06/14 2007 05/06/14 2014      Assessment: 81 yo M who presents with worsening altered mental status.  He had fever 101.3 while in ED so empiric, broad-spectrum antibiotics were initiated for UTI  and possible HCAP (he was hospitalized ~ 1 month ago). CXR + bronchitis. WBC is normal, but lactic acid is elevated.  NCrCl ~ 7140ml/min  Vancomycin 3/6>> Zosyn 3/6>>  Goal of Therapy:  Vancomycin trough level 15-20 mcg/ml  Plan:  Zosyn 3.375gm IV Q8h to be infused over 4hrs Vancomycin 1500mg  IV q24h Check Vancomycin trough at steady state Monitor renal function and cx data  Duration of therapy per MD- de-escalate once appropriate  Nataki Mccrumb, Mercy RidingAndrea Michelle 05/06/2014,10:50 PM

## 2014-05-06 NOTE — ED Notes (Signed)
Patient unable to stand for orthostatic 

## 2014-05-07 ENCOUNTER — Encounter (HOSPITAL_COMMUNITY): Payer: Self-pay | Admitting: *Deleted

## 2014-05-07 DIAGNOSIS — M109 Gout, unspecified: Secondary | ICD-10-CM | POA: Insufficient documentation

## 2014-05-07 DIAGNOSIS — M10071 Idiopathic gout, right ankle and foot: Secondary | ICD-10-CM

## 2014-05-07 DIAGNOSIS — E089 Diabetes mellitus due to underlying condition without complications: Secondary | ICD-10-CM

## 2014-05-07 LAB — URINALYSIS, ROUTINE W REFLEX MICROSCOPIC
Bilirubin Urine: NEGATIVE
GLUCOSE, UA: 250 mg/dL — AB
HGB URINE DIPSTICK: NEGATIVE
Ketones, ur: NEGATIVE mg/dL
Leukocytes, UA: NEGATIVE
Nitrite: NEGATIVE
PROTEIN: 30 mg/dL — AB
Specific Gravity, Urine: >1.03 — ABNORMAL HIGH (ref 1.005–1.030)
Urobilinogen, UA: 0.2 mg/dL (ref 0.0–1.0)
pH: 5.5 (ref 5.0–8.0)

## 2014-05-07 LAB — GLUCOSE, CAPILLARY
GLUCOSE-CAPILLARY: 102 mg/dL — AB (ref 70–99)
Glucose-Capillary: 61 mg/dL — ABNORMAL LOW (ref 70–99)
Glucose-Capillary: 174 mg/dL — ABNORMAL HIGH (ref 70–99)
Glucose-Capillary: 54 mg/dL — ABNORMAL LOW (ref 70–99)
Glucose-Capillary: 82 mg/dL (ref 70–99)

## 2014-05-07 LAB — CBC
HCT: 32.7 % — ABNORMAL LOW (ref 39.0–52.0)
HCT: 32.8 % — ABNORMAL LOW (ref 39.0–52.0)
Hemoglobin: 10.8 g/dL — ABNORMAL LOW (ref 13.0–17.0)
Hemoglobin: 10.9 g/dL — ABNORMAL LOW (ref 13.0–17.0)
MCH: 32.1 pg (ref 26.0–34.0)
MCH: 32.2 pg (ref 26.0–34.0)
MCHC: 33 g/dL (ref 30.0–36.0)
MCHC: 33.2 g/dL (ref 30.0–36.0)
MCV: 97.3 fL (ref 78.0–100.0)
MCV: 96.8 fL (ref 78.0–100.0)
PLATELETS: 168 10*3/uL (ref 150–400)
Platelets: 179 10*3/uL (ref 150–400)
RBC: 3.36 MIL/uL — ABNORMAL LOW (ref 4.22–5.81)
RBC: 3.39 MIL/uL — AB (ref 4.22–5.81)
RDW: 13.2 % (ref 11.5–15.5)
RDW: 13.3 % (ref 11.5–15.5)
WBC: 5.7 10*3/uL (ref 4.0–10.5)
WBC: 6.9 10*3/uL (ref 4.0–10.5)

## 2014-05-07 LAB — BASIC METABOLIC PANEL
Anion gap: 9 (ref 5–15)
BUN: 20 mg/dL (ref 6–23)
CO2: 26 mmol/L (ref 19–32)
CREATININE: 1.15 mg/dL (ref 0.50–1.35)
Calcium: 7.9 mg/dL — ABNORMAL LOW (ref 8.4–10.5)
Chloride: 105 mmol/L (ref 96–112)
GFR calc Af Amer: 67 mL/min — ABNORMAL LOW (ref >90)
GFR calc non Af Amer: 58 mL/min — ABNORMAL LOW (ref >90)
Glucose, Bld: 60 mg/dL — ABNORMAL LOW (ref 70–99)
Potassium: 3.2 mmol/L — ABNORMAL LOW (ref 3.5–5.1)
Sodium: 140 mmol/L (ref 135–145)

## 2014-05-07 LAB — URINE MICROSCOPIC-ADD ON

## 2014-05-07 LAB — TROPONIN I: Troponin I: <0.03 ng/mL (ref <0.031)

## 2014-05-07 LAB — CREATININE, SERUM
Creatinine, Ser: 1.35 mg/dL (ref 0.50–1.35)
GFR calc Af Amer: 56 mL/min — ABNORMAL LOW (ref >90)
GFR calc non Af Amer: 48 mL/min — ABNORMAL LOW (ref >90)

## 2014-05-07 MED ORDER — IPRATROPIUM-ALBUTEROL 0.5-2.5 (3) MG/3ML IN SOLN
3.0000 mL | RESPIRATORY_TRACT | Status: DC
  Administered 2014-05-07 (×2): 3 mL via RESPIRATORY_TRACT
  Filled 2014-05-07 (×2): qty 3

## 2014-05-07 MED ORDER — COLCHICINE 0.6 MG PO TABS: 0.6000 mg | ORAL_TABLET | Freq: Two times a day (BID) | ORAL | Status: DC

## 2014-05-07 MED ORDER — PANTOPRAZOLE SODIUM 40 MG PO TBEC: 40.0000 mg | DELAYED_RELEASE_TABLET | Freq: Every day | ORAL | Status: DC

## 2014-05-07 MED ORDER — HYDROCODONE-HOMATROPINE 5-1.5 MG/5ML PO SYRP: 5.0000 mL | ORAL_SOLUTION | Freq: Three times a day (TID) | ORAL | Status: DC | PRN

## 2014-05-07 MED ORDER — LEVALBUTEROL HCL 0.63 MG/3ML IN NEBU: 0.6300 mg | INHALATION_SOLUTION | Freq: Three times a day (TID) | RESPIRATORY_TRACT | Status: DC

## 2014-05-07 MED ORDER — IPRATROPIUM-ALBUTEROL 0.5-2.5 (3) MG/3ML IN SOLN: 3.0000 mL | RESPIRATORY_TRACT | Status: DC

## 2014-05-07 MED ORDER — POTASSIUM CHLORIDE CRYS ER 20 MEQ PO TBCR
40.0000 meq | EXTENDED_RELEASE_TABLET | Freq: Once | ORAL | Status: AC
  Administered 2014-05-07: 40 meq via ORAL
  Filled 2014-05-07: qty 2

## 2014-05-07 MED ORDER — POLYSACCHARIDE IRON COMPLEX 150 MG PO CAPS: 150.0000 mg | ORAL_CAPSULE | Freq: Two times a day (BID) | ORAL | Status: DC

## 2014-05-07 MED ORDER — LEVOFLOXACIN 750 MG PO TABS: 750.0000 mg | ORAL_TABLET | Freq: Every day | ORAL | Status: DC

## 2014-05-07 MED ORDER — HYDROCODONE-HOMATROPINE 5-1.5 MG/5ML PO SYRP
5.0000 mL | ORAL_SOLUTION | Freq: Four times a day (QID) | ORAL | Status: DC | PRN
  Administered 2014-05-07: 5 mL via ORAL
  Filled 2014-05-07: qty 5

## 2014-05-07 NOTE — Discharge Summary (Signed)
Physician Discharge Summary  [name] ZOX:[id number] DOB: 09/29/1931 DOA: 05/06/2014  PCP: Frederica Kuster, MD  Admit date: 05/06/2014 Discharge date: 05/07/2014  Time spent: 40 minutes  Recommendations for Outpatient Follow-up:  1. Follow up PCP 1-2 weeks for evaluation of respiratory status, management of chronic bronchits. May benefit PFT 2. HH PT  Discharge Diagnoses:  Principal Problem:   Encephalopathy acute Active Problems:   CAD (coronary artery disease) of artery bypass graft   Dementia with behavioral disturbance   Cough   Chronic cough   Carotid artery occlusion   Seizures   Acute bronchospasm   Diabetes mellitus due to underlying condition without complications   UTI (lower urinary tract infection)   Acute renal insufficiency   Dehydration   FTT (failure to thrive) in adult   Gout flare   Acute respiratory failure with hypoxia   Pyrexia   Renal insufficiency   Acute gout   Discharge Condition: stable  Diet recommendation: heart healthy carb modified  Filed Weights   05/06/14 1828 05/06/14 2205 05/07/14 0541  Weight: 95.255 kg (210 lb) 93.713 kg (206 lb 9.6 oz) 94.3 kg (207 lb 14.3 oz)    History of present illness:  81 yo male h/o dementia, dm, htn, hld, CAD, one seizure presented to ED on 05/06/14 with family for worsening weakness and frequent falls for over a month. Had been coughing more than normal. Also complained of dysuria for several days and pain in his right toe for 4 days. Not been eating or drinking well. No fevers, but has temp in ED over 101. No n/v/d. No swelling in his legs. Most history obtained from EDP, reported that pt was confused but at time of admission was alert and oriented to person, time and place and answered questions appropriately. Denied any pain, but dysuria sometimes and worsening cough and rt toe pain.   Hospital Course:  UTI (lower urinary tract infection)- Provided with vanc/zosyn for broad coverage. At  discharge afebrile and non-toxic. Discharged with levaquin.     Acute respiratory failure with hypoxia- resolved at discharge.  has hx of chronic bronchitis. Antibiotics as above. Follow up with PCP 1-2 weeks.    CAD (coronary artery disease) of artery bypass graft- no chest pain.    Dementia with behavioral disturbance- related to #1. Resolved quickly with IV fluids and antibiotics.   Cough- As above   Acute bronchospasm- reported mild wheezing in ED, resolved with alb neb. Cont his home duoneb treatments, no actual formal dx of copd in epic.    Acute renal insufficiency- Ivf resolved at discharge.  FTT (failure to thrive) in adult- home health PT  possible Gout flare- Uric acid level within limits of normal.Right toe painful to touch and move but not really swollen or red. colchecine started   Procedures:  none  Consultations:  none  Discharge Exam: Filed Vitals:   05/07/14 1341  BP: 121/61  Pulse: 76  Temp: 97.7 F (36.5 C)  Resp: 20    General: well nourished smiling Cardiovascular: rrr no MGR No LE edema Respiratory: normal effort BS somewhat coarse but good flow.   Discharge Instructions   Discharge Instructions    Diet - low sodium heart healthy    Complete by:  As directed      Face-to-face encounter (required for Medicare/Medicaid patients)    Complete by:  As directed   I MEMON,JEHANZEB certify that this patient is under my care and that I, or a nurse practitioner or  physician's assistant working with me, had a face-to-face encounter that meets the physician face-to-face encounter requirements with this patient on 05/07/2014. The encounter with the patient was in whole, or in part for the following medical condition(s) which is the primary reason for home health care (List medical condition): dehydration  The encounter with the patient was in whole, or in part, for the following medical condition, which is the primary reason for home health care:   dehydration  I certify that, based on my findings, the following services are medically necessary home health services:  Nursing  Reason for Medically Necessary Home Health Services:  Therapy- Therapeutic Exercises to Increase Strength and Endurance  My clinical findings support the need for the above services:  Unable to leave home safely without assistance and/or assistive device  Further, I certify that my clinical findings support that this patient is homebound due to:  Shortness of Breath with activity     Home Health    Complete by:  As directed   To provide the following care/treatments:  PT     Increase activity slowly    Complete by:  As directed           Current Discharge Medication List    START taking these medications   Details  colchicine 0.6 MG tablet Take 1 tablet (0.6 mg total) by mouth 2 (two) times daily. Qty: 20 tablet, Refills: 0    levofloxacin (LEVAQUIN) 750 MG tablet Take 1 tablet (750 mg total) by mouth daily. Qty: 5 tablet, Refills: 0      CONTINUE these medications which have CHANGED   Details  HYDROcodone-homatropine (HYCODAN) 5-1.5 MG/5ML syrup Take 5 mLs by mouth every 8 (eight) hours as needed for cough. Qty: 120 mL, Refills: 0    iron polysaccharides (NIFEREX) 150 MG capsule Take 1 capsule (150 mg total) by mouth 2 (two) times daily. Qty: 60 capsule, Refills: 0    levalbuterol (XOPENEX) 0.63 MG/3ML nebulizer solution Take 3 mLs (0.63 mg total) by nebulization 3 (three) times daily. Use 3 times daily for 5 days, then use every 4 hours as needed for wheezing. Qty: 72 mL, Refills: 0    pantoprazole (PROTONIX) 40 MG tablet Take 1 tablet (40 mg total) by mouth daily. Qty: 30 tablet, Refills: 0      CONTINUE these medications which have NOT CHANGED   Details  aspirin EC 81 MG EC tablet Take 1 tablet (81 mg total) by mouth 2 (two) times daily.    clopidogrel (PLAVIX) 75 MG tablet Take 1 tablet (75 mg total) by mouth daily. Qty: 90 tablet,  Refills: 1    ipratropium (ATROVENT) 0.02 % nebulizer solution Take 2.5 mLs (0.5 mg total) by nebulization 3 (three) times daily. Take for 5 days, then may use every 6 hours as needed. Qty: 75 mL, Refills: 0    memantine (NAMENDA XR) 28 MG CP24 24 hr capsule Take 28 mg by mouth every morning.    metoprolol (LOPRESSOR) 50 MG tablet Take 25 mg by mouth every morning.     QUEtiapine (SEROQUEL) 50 MG tablet Take one tablet by mouth in AM and two tablets at bedtime Qty: 90 tablet, Refills: 3    ACCU-CHEK AVIVA PLUS test strip TEST TWICE A DAY Qty: 100 each, Refills: 5    niacin (NIASPAN) 1000 MG CR tablet Take 1 tablet (1,000 mg total) by mouth at bedtime. Qty: 90 tablet, Refills: 3   Associated Diagnoses: Hyperlipidemia      STOP  taking these medications     ALPRAZolam (XANAX) 0.5 MG tablet      glyBURIDE (DIABETA) 2.5 MG tablet        Allergies  Allergen Reactions  . Fexofenadine Hcl Other (See Comments)    Heart attack symptoms  . Flonase [Fluticasone Propionate] Other (See Comments)    Heart attack symptoms  . Nasal Spray Other (See Comments)    Heart attack symptoms   . Nasal Spray     CAN NOT TOLERATE ANY NASAL SPRAYS!!!!!  Causes heart attack symptoms   Follow-up Information    Follow up with Frederica Kuster, MD. Schedule an appointment as soon as possible for a visit in 1 week.   Specialty:  Family Medicine   Contact information:   [address] [place] [address] [phone]        The results of significant diagnostics from this hospitalization (including imaging, microbiology, ancillary and laboratory) are listed below for reference.    Significant Diagnostic Studies: Ct Head Wo Contrast  05/06/2014   CLINICAL DATA:  Altered mental status  EXAM: CT HEAD WITHOUT CONTRAST  TECHNIQUE: Contiguous axial images were obtained from the base of the skull through the vertex without intravenous contrast.  COMPARISON:  CT 05/14/2011  FINDINGS: Generalized atrophy  without hydrocephalus. Mild progression of atrophy.  Chronic microvascular ischemic change in the white matter. Chronic infarct in the right posterior basal ganglia is unchanged. Small chronic infarct left cerebellum.  Negative for acute infarct.  Negative for hemorrhage or mass.  Calvarium intact.  Atherosclerotic calcification.  IMPRESSION: Atrophy and chronic microvascular ischemia.  No acute abnormality.   Electronically Signed   By: Marlan Palau M.D.   On: 05/06/2014 20:30   Ct Angio Chest Pe W/cm &/or Wo Cm  04/10/2014   CLINICAL DATA:  Dyspnea since 1500 hr. Chest pain, cough, gagging and fever.  EXAM: CT ANGIOGRAPHY CHEST WITH CONTRAST  TECHNIQUE: Multidetector CT imaging of the chest was performed using the standard protocol during bolus administration of intravenous contrast. Multiplanar CT image reconstructions and MIPs were obtained to evaluate the vascular anatomy.  CONTRAST:  OMNIPAQUE IOHEXOL 350 MG/ML SOLN  COMPARISON:  None.  FINDINGS: Technically adequate study with moderately good opacification of the central and segmental pulmonary arteries. No focal filling defects demonstrated. No evidence of significant pulmonary embolus.  Postoperative changes in the mediastinum consistent with previous bypass graft. Normal heart size. Normal caliber thoracic aorta with calcification and atherosclerotic changes. Great vessel origins are patent. No significant lymphadenopathy in the chest. Esophagus is decompressed. Small esophageal hiatal hernia.  Emphysematous changes in the lungs. No focal airspace disease or consolidation. No pneumothorax. No pleural effusions. Calcified granuloma in the right lung. Airways appear patent.  Included portions of the upper abdominal organs demonstrate fatty infiltration in the liver. Degenerative changes in the spine.  Review of the MIP images confirms the above findings.  IMPRESSION: No evidence of significant pulmonary embolus. No evidence of active pulmonary  disease.   Electronically Signed   By: Burman Nieves M.D.   On: 04/10/2014 22:31   Dg Chest Portable 1 View  05/06/2014   CLINICAL DATA:  Dry hacking cough for 1 week. Frequent falls. Diabetes. Altered mental status.  EXAM: PORTABLE CHEST - 1 VIEW  COMPARISON:  05/06/2014  FINDINGS: Prior CABG. Low lung volumes. Given the low lung volumes and AP projection, heart size is considered within normal limits.  Airway thickening is present, suggesting bronchitis or reactive airways disease.  IMPRESSION: 1. Airway thickening is present, suggesting bronchitis or reactive airways disease. 2. Prior CABG.   Electronically Signed   By: Gaylyn Rong M.D.   On: 05/06/2014 19:10   Dg Chest Portable 1 View  04/10/2014   CLINICAL DATA:  Shortness of breath, wheezing, chest pain, cough since October  EXAM: PORTABLE CHEST - 1 VIEW  COMPARISON:  Earlier films of the same day  FINDINGS: Previous CABG. Heart size upper limits normal. Lungs are clear. No pneumothorax. No effusion.  IMPRESSION: No acute disease post CABG.   Electronically Signed   By: Corlis Leak M.D.   On: 04/10/2014 21:32    Microbiology: No results found for this or any previous visit (from the past 240 hour(s)).   Labs: Basic Metabolic Panel:  Recent Labs Lab 05/06/14 1922 05/07/14 0005 05/07/14 0440  NA 137  --  140  K 4.0  --  3.2*  CL 101  --  105  CO2 25  --  26  GLUCOSE 117*  --  60*  BUN 23  --  20  CREATININE 1.48* 1.35 1.15  CALCIUM 8.5  --  7.9*   Liver Function Tests:  Recent Labs Lab 05/06/14 1922  AST 23  ALT 20  ALKPHOS 46  BILITOT 0.4  PROT 7.2  ALBUMIN 3.4*   No results for input(s): LIPASE, AMYLASE in the last 168 hours. No results for input(s): AMMONIA in the last 168 hours. CBC:  Recent Labs Lab 05/06/14 1922 05/07/14 0005 05/07/14 0440  WBC 9.8 6.9 5.7  NEUTROABS 7.1  --   --   HGB 12.6* 10.9* 10.8*  HCT 37.3* 32.8* 32.7*  MCV 97.1 96.8 97.3  PLT 199 179 168   Cardiac Enzymes:  Recent  Labs Lab 05/06/14 1922 05/07/14 0005 05/07/14 0440 05/07/14 0955  TROPONINI <0.03 <0.03 <0.03 <0.03   BNP: BNP (last 3 results)  Recent Labs  04/10/14 2108  BNP 21.0    ProBNP (last 3 results) No results for input(s): PROBNP in the last 8760 hours.  CBG:  Recent Labs Lab 05/07/14 0807 05/07/14 0922 05/07/14 1148  GLUCAP 61* 102* 174*       Signed:  BLACK,KAREN M  [facility] 05/07/2014, 3:58 PM

## 2014-05-07 NOTE — Evaluation (Signed)
Physical Therapy Evaluation Patient Details Name: [name] MRN: [id number] DOB: 1931-10-31 Today's Date: 05/07/2014   History of Present Illness  81 yo male h/o dementia, dm, htn, hld, CAD, one seizure comes in with family for worsening weakness and frequent falls for over a month. Has been coughing more than normal. Also complains of dysuria for several days and pain in his right toe for 4 days. Not been eating or drinking well. No fevers, but has temp here over 101. No n/v/d. No swelling in his legs. Most history obtained from [place], reported that pt was confused but right now is alert and oriented to person, time and place and answers questions appropriately. Denies any pain, but dysuria sometimes and worsening cough and rt toe pain.  Pt lives with his son and daughter in law and is normally fairly independent with ADLs.  Family states that his gait has been more unsteady recently and he generally holds onto the walls as he walks.  Yesterday, he was unable to stand due to such severe "gout" in both great toes.  Clinical Impression  Pt was seen for evaluation.  He was very pleasant and cooperative, has dementia at baseline.  Per family, cognition is back to normal.  He was initally on supplemental O2 at 1,5 L/min.  He reported 8/10 pain in both great toes but when they were inspected he changed his mind and said he had no pain currently.  There was no heat or erythema in either foot.  His strength is found to be WNL but his balance is decreased.  He ambulates with a wide based gait and does not fully extend his left knee in stance.  Gait endurance is good with a walker (400') but because of mild instability, I would recommend HHPT to ensure stability.  O2 sat on room air with exertion=98%.      Follow Up Recommendations Home health PT    Equipment Recommendations  Rolling walker with 5" wheels    Recommendations for Other Services   none    Precautions / Restrictions  Precautions Precautions: Fall Restrictions Weight Bearing Restrictions: No      Mobility  Bed Mobility Overal bed mobility: Needs Assistance Bed Mobility: Supine to Sit     Supine to sit: Supervision        Transfers Overall transfer level: Needs assistance Equipment used: Rolling walker (2 wheeled) Transfers: Sit to/from Stand Sit to Stand: Supervision            Ambulation/Gait Ambulation/Gait assistance: Supervision Ambulation Distance (Feet): 400 Feet Assistive device: Rolling walker (2 wheeled) Gait Pattern/deviations: Wide base of support   Gait velocity interpretation: at or above normal speed for age/gender General Gait Details: unable to fully extend the left knee during stance phase of gait...maintains very wide base of support and leans quite heavily on walker  Stairs            Wheelchair Mobility    Modified Rankin (Stroke Patients Only)       Balance Overall balance assessment: Needs assistance Sitting-balance support: No upper extremity supported;Feet supported Sitting balance-Leahy Scale: Good     Standing balance support: Bilateral upper extremity supported Standing balance-Leahy Scale: Fair                               Pertinent Vitals/Pain Pain Assessment: No/denies pain    Home Living Family/patient expects to be discharged to:: Private residence Living Arrangements:  Children Available Help at Discharge: Family;Available 24 hours/day Type of Home: House Home Access: Ramped entrance     Home Layout: One level Home Equipment: Cane - single point      Prior Function Level of Independence: Independent               Hand Dominance        Extremity/Trunk Assessment               Lower Extremity Assessment: Overall WFL for tasks assessed         Communication   Communication: No difficulties  Cognition Arousal/Alertness: Awake/alert   Overall Cognitive Status: History of cognitive  impairments - at baseline                      General Comments      Exercises        Assessment/Plan    PT Assessment All further PT needs can be met in the next venue of care  PT Diagnosis Difficulty walking;Abnormality of gait   PT Problem List Decreased balance  PT Treatment Interventions     PT Goals (Current goals can be found in the Care Plan section) Acute Rehab PT Goals PT Goal Formulation: All assessment and education complete, DC therapy    Frequency     Barriers to discharge        Co-evaluation               End of Session Equipment Utilized During Treatment: Gait belt Activity Tolerance: Patient tolerated treatment well Patient left: in bed;with call bell/phone within reach;with bed alarm set;with family/visitor present Nurse Communication: Mobility status         Time: 1110-1131 PT Time Calculation (min) (ACUTE ONLY): 21 min   Charges:   PT Evaluation $Initial PT Evaluation Tier I: 1 Procedure     PT G CodesDemetrios Isaacs L 05/07/2014, 11:46 AM

## 2014-05-07 NOTE — Progress Notes (Signed)
Foley catheter removed at 1542 today in anticipation of discharge. However patient has been unable to void greater than 25mL of urine since foley catheter has been removed. Patient given fluids and encouraged to drink. Patient has been assisted to the bathroom multiple times to try to void. Will continue to assist patient. MD and NP both made aware of situation. AVS summary and prescriptions were reviewed and given to Lawson FiscalLori (patient's daughter-in-law). Heart monitor removed in anticipation of discharge. Patient's still has IV in order to receive fluids to aid in voiding. IV needs to be removed prior to discharge.

## 2014-05-07 NOTE — Care Management Note (Signed)
    Page 1 of 2   05/07/2014     12:37:22 PM CARE MANAGEMENT NOTE 05/07/2014  Patient:  Markus JarvisBULLINS,Rishaan H   Account Number:  [id number]  Date Initiated:  05/07/2014  Documentation initiated by:  Kathyrn SheriffHILDRESS,JESSICA  Subjective/Objective Assessment:   Pt is from home, lives with son and daughter in law. Pt is independent at baseline. PT plans to return home at at discharge. Pt ordered HH PT and Rolling walker per PT's recommendation.     Action/Plan:   Pt has choosen [facility] for HH and DME's. Alroy BailiffLinda Lothian of [facility] notofied of referral and will obtain pt information from chart. Kara Meadmma of [facility] notified and will obtain pt information from chart walker will be delivered to pt's room prior to discharg   Anticipated DC Date:  05/08/2014   Anticipated DC Plan:  HOME W HOME HEALTH SERVICES      DC Planning Services  CM consult      [facility]   Choice offered to / List presented to:  C-1 Patient   DME arranged  [facility]stWALK[facility] - [facility]      DME agency  [facility]     [facility]  [facility]      [facility]  [facility]   Status of service:  Completed, signed off Medicare Important Message given?   (If response is "NO", the following Medicare IM given date fields will be blank) Date Medicare IM given:   Medicare IM given by:   Date Additional Medicare IM given:   Additional Medicare IM given by:    Discharge Disposition:  HOME W HOME HEALTH SERVICES  Per UR Regulation:  Reviewed for med. necessity/level of care/duration of stay  If discussed at Long Length of Stay Meetings, dates discussed:    Comments:  05/07/2014 1230 Kathyrn SheriffJessica Childress, RN, MSN, CM [facility] will see pt within 48 hours after discharge from hospital.

## 2014-05-07 NOTE — Progress Notes (Signed)
Patient not voided during entire night shift.  Patient stated that when he was home and did void, he did have a burning sensation.  Bladder scanned patient and it showed greater than 200 cc of urine.  Notified MD on call, and received order for foley catheter.  Foley catheter inserted and 400cc cloudy urine returned.  Patient tolerated procedure well, and will continue to monitor patient.

## 2014-05-07 NOTE — Care Management Utilization Note (Signed)
UR completed 

## 2014-05-07 NOTE — Progress Notes (Signed)
Notified MD (Dr. Kerry HoughMemon), pt voided 400 cc of urine. Removed iv and heart monitor. IV site clean and dry, upon removal. Pt alert and oriented x4. Pt stable upon discharge. Family at bedside. Discharge paperwork has been reviewed. Nursing staff escorted pt downstairs in wheelchair.

## 2014-05-08 ENCOUNTER — Other Ambulatory Visit: Payer: Self-pay | Admitting: Family Medicine

## 2014-05-08 LAB — URINE CULTURE
Colony Count: NO GROWTH
Culture: NO GROWTH

## 2014-05-08 MED ORDER — LORAZEPAM 0.5 MG PO TABS: 0.5000 mg | ORAL_TABLET | Freq: Two times a day (BID) | ORAL | Status: DC | PRN

## 2014-05-08 NOTE — Telephone Encounter (Signed)
Refilled lorazepam since alprazolam is not working

## 2014-05-08 NOTE — Telephone Encounter (Signed)
Patient family aware rx ready to be picked up

## 2014-05-10 ENCOUNTER — Ambulatory Visit (HOSPITAL_COMMUNITY): Payer: Medicare Other | Attending: Family Medicine | Admitting: Radiology

## 2014-05-10 DIAGNOSIS — I10 Essential (primary) hypertension: Secondary | ICD-10-CM | POA: Diagnosis not present

## 2014-05-10 DIAGNOSIS — R0609 Other forms of dyspnea: Secondary | ICD-10-CM | POA: Diagnosis not present

## 2014-05-10 DIAGNOSIS — I25810 Atherosclerosis of coronary artery bypass graft(s) without angina pectoris: Secondary | ICD-10-CM

## 2014-05-10 DIAGNOSIS — E119 Type 2 diabetes mellitus without complications: Secondary | ICD-10-CM | POA: Insufficient documentation

## 2014-05-10 DIAGNOSIS — R079 Chest pain, unspecified: Secondary | ICD-10-CM | POA: Insufficient documentation

## 2014-05-10 MED ORDER — REGADENOSON 0.4 MG/5ML IV SOLN
0.4000 mg | Freq: Once | INTRAVENOUS | Status: AC
  Administered 2014-05-10: 0.4 mg via INTRAVENOUS

## 2014-05-10 MED ORDER — TECHNETIUM TC 99M SESTAMIBI GENERIC - CARDIOLITE
11.0000 | Freq: Once | INTRAVENOUS | Status: AC | PRN
  Administered 2014-05-10: 11 via INTRAVENOUS

## 2014-05-10 MED ORDER — TECHNETIUM TC 99M SESTAMIBI GENERIC - CARDIOLITE
33.0000 | Freq: Once | INTRAVENOUS | Status: AC | PRN
  Administered 2014-05-10: 33 via INTRAVENOUS

## 2014-05-10 NOTE — Progress Notes (Signed)
[facility] S[facility] NUCLEAR MED [address] [place], [place] [address] [phone]    Cardiology Nuclear Med Study  Markus JarvisSylvester H Eustice is a 81 y.o. male     MRN : [id number]     DOB: 1932-02-09  Procedure Date: 05/10/2014  Nuclear Med Background Indication for Stress Test:  Evaluation for Ischemia and Post Hospital 2/16 CP History:  CAD, MPI 2007 (normal) EF 53%, COPD Cardiac Risk Factors: Carotid Disease, Hypertension and NIDDM  Symptoms:  Chest Pain and DOE   Nuclear Pre-Procedure Caffeine/Decaff Intake:  None NPO After: 5pm   Lungs:  clear O2 Sat: 96% on room air. IV 0.9% NS with Angio Cath:  22g  IV Site: L Hand  IV Started by:  Bonnita LevanJackie Smith, RN  Chest Size (in):  48 Cup Size: n/a  Height: 5\' 11"  (1.803 m)  Weight:  212 lb (96.163 kg)  BMI:  Body mass index is 29.58 kg/(m^2). Tech Comments:  N/A    Nuclear Med Study 1 or 2 day study: 1 day  Stress Test Type:  Lexiscan  Reading MD: N/A  Order Authorizing Provider:  Rollene RotundaJames Hochrein, MD  Resting Radionuclide: Technetium 5866m Sestamibi  Resting Radionuclide Dose: 11.0 mCi   Stress Radionuclide:  Technetium 6466m Sestamibi  Stress Radionuclide Dose: 33.0 mCi           Stress Protocol Rest HR: 92 Stress HR: 101  Rest BP: 131/73 Stress BP: 115/66  Exercise Time (min): n/a METS: n/a   Predicted Max HR: 140 bpm % Max HR: 72.14 bpm Rate Pressure Product: [id number]   Dose of Adenosine (mg):  n/a Dose of Lexiscan: 0.4 mg  Dose of Atropine (mg): n/a Dose of Dobutamine: n/a mcg/kg/min (at max HR)  Stress Test Technologist: Nelson ChimesSharon Brooks, BS-ES  Nuclear Technologist:  Kerby NoraElzbieta Kubak, CNMT     Rest Procedure:  Myocardial perfusion imaging was performed at rest 45 minutes following the intravenous administration of Technetium 6366m Sestamibi. Rest ECG: NSR - Normal EKG  Stress Procedure:  The patient received IV Lexiscan 0.4 mg over 15-seconds.  Technetium 3566m Sestamibi injected at 30-seconds.  Quantitative spect images  were obtained after a 45 minute delay.  During the infusion of Lexiscan the patient complained of SOB that resolved in recovery.  Stress ECG: No significant change from baseline ECG  QPS Raw Data Images:  Normal; no motion artifact; normal heart/lung ratio. Stress Images:  There is mild apical thinning but otherwise normal uptake in all areas of the LV.  Rest Images:  There is mild apical thinning but otherwise normal uptake in all areas of the LV.   Subtraction (SDS):  No evidence of ischemia. Transient Ischemic Dilatation (Normal <1.22):  1.04 Lung/Heart Ratio (Normal <0.45):  0.38  Quantitative Gated Spect Images QGS EDV:  102 ml QGS ESV:  61 ml  Impression Exercise Capacity:  Lexiscan with no exercise. BP Response:  Normal blood pressure response. Clinical Symptoms:  No significant symptoms noted. ECG Impression:  No significant ST segment change suggestive of ischemia. Comparison with Prior Nuclear Study: No significant change from previous study on 03/19/11.   Overall Impression:  Low risk stress nuclear study .  Marland Kitchen.   There is no ischemia.  The LV function is mildly depressed.   LV Ejection Fraction: 40%.  LV Wall Motion:  There is mild, global LV dysfunction.

## 2014-05-11 ENCOUNTER — Telehealth: Payer: Self-pay | Admitting: Family Medicine

## 2014-05-11 ENCOUNTER — Other Ambulatory Visit (INDEPENDENT_AMBULATORY_CARE_PROVIDER_SITE_OTHER): Payer: Medicare Other

## 2014-05-11 DIAGNOSIS — R350 Frequency of micturition: Secondary | ICD-10-CM

## 2014-05-11 LAB — POCT UA - MICROSCOPIC ONLY
Bacteria, U Microscopic: NEGATIVE
CRYSTALS, UR, HPF, POC: NEGATIVE
Casts, Ur, LPF, POC: NEGATIVE
YEAST UA: NEGATIVE

## 2014-05-11 LAB — POCT URINALYSIS DIPSTICK
Bilirubin, UA: NEGATIVE
GLUCOSE UA: NEGATIVE
KETONES UA: NEGATIVE
Nitrite, UA: NEGATIVE
Protein, UA: NEGATIVE
Spec Grav, UA: 1.02
Urobilinogen, UA: NEGATIVE
pH, UA: 6

## 2014-05-11 NOTE — Progress Notes (Signed)
Lab only 

## 2014-05-11 NOTE — Telephone Encounter (Signed)
Patient was advised to bring urine by the office for us to check and then we will call patient with results.

## 2014-05-12 ENCOUNTER — Other Ambulatory Visit: Payer: Self-pay | Admitting: Cardiology

## 2014-05-12 LAB — URINE CULTURE: ORGANISM ID, BACTERIA: NO GROWTH

## 2014-05-14 NOTE — Telephone Encounter (Signed)
Rx has been sent to the pharmacy electronically. ° °

## 2014-05-14 NOTE — Telephone Encounter (Signed)
Patients family aware of results

## 2014-05-17 ENCOUNTER — Encounter: Payer: Self-pay | Admitting: Family Medicine

## 2014-05-17 ENCOUNTER — Ambulatory Visit (INDEPENDENT_AMBULATORY_CARE_PROVIDER_SITE_OTHER): Payer: Medicare Other | Admitting: Family Medicine

## 2014-05-17 VITALS — BP 136/72 | HR 96 | Temp 98.2°F | Ht 71.0 in | Wt 213.0 lb

## 2014-05-17 DIAGNOSIS — L03818 Cellulitis of other sites: Secondary | ICD-10-CM

## 2014-05-17 DIAGNOSIS — I6523 Occlusion and stenosis of bilateral carotid arteries: Secondary | ICD-10-CM | POA: Diagnosis not present

## 2014-05-17 MED ORDER — CEPHALEXIN 500 MG PO CAPS: 500.0000 mg | ORAL_CAPSULE | Freq: Two times a day (BID) | ORAL | Status: DC

## 2014-05-17 MED ORDER — LORAZEPAM 0.5 MG PO TABS: ORAL_TABLET | ORAL | Status: DC

## 2014-05-17 NOTE — Progress Notes (Signed)
Subjective:    Patient ID: [name], male    DOB: 1931-06-22, 81 y.o.   MRN: [id number]  HPI 81 year old gentleman who was seen in the emergency room recently and treated for a urinary tract infection. During the course of that visit he had an IV in his right hand and I am not sure whether it infiltrated or a received medicine there but now he has some swelling and discomfort and some erythema which is his chief complaint today.    Review of Systems  Constitutional: Negative.   Respiratory: Negative.   Cardiovascular: Negative.   Genitourinary: Negative.   Skin: Positive for color change.  Psychiatric/Behavioral: Positive for hallucinations and behavioral problems.    Patient Active Problem List   Diagnosis Date Noted  . Acute gout   . Encephalopathy acute 05/06/2014  . UTI (lower urinary tract infection) 05/06/2014  . Acute renal insufficiency 05/06/2014  . Dehydration 05/06/2014  . FTT (failure to thrive) in adult 05/06/2014  . Gout flare 05/06/2014  . Acute respiratory failure with hypoxia 05/06/2014  . Pyrexia   . Renal insufficiency   . Diabetes mellitus due to underlying condition without complications   . COPD exacerbation 04/11/2014  . Pain in the chest   . Dyspnea 04/10/2014  . Chest pain 04/10/2014  . Chronic cough 04/10/2014  . Essential hypertension 04/10/2014  . Carotid artery occlusion 04/10/2014  . CVA (cerebral vascular accident) 04/10/2014  . Acute bronchospasm 04/10/2014  . Anemia 04/10/2014  . Dementia with behavioral disturbance 11/15/2013  . Cough 11/15/2013  . Benign paroxysmal positional vertigo 01/30/2013  . Adjustment reaction of adult life 01/30/2013  . Need for prophylactic vaccination and inoculation against influenza 12/09/2012  . Calcaneal fracture 09/09/2012  . Seizure 05/14/2011  . Hypotension, unspecified 05/14/2011  . Seizures 05/14/2011  . Preop cardiovascular exam 03/17/2011  . Diabetes mellitus   . CAD (coronary  artery disease) of artery bypass graft   . Hyperlipidemia   . Hypertension   . Peripheral vascular disease   . Occlusion and stenosis of carotid artery without mention of cerebral infarction 02/11/2011   Outpatient Encounter Prescriptions as of 05/17/2014  Medication Sig  . ACCU-CHEK AVIVA PLUS test strip TEST TWICE A DAY  . aspirin EC 81 MG EC tablet Take 1 tablet (81 mg total) by mouth 2 (two) times daily.  . clopidogrel (PLAVIX) 75 MG tablet TAKE 1 TABLET DAILY  . iron polysaccharides (NIFEREX) 150 MG capsule Take 1 capsule (150 mg total) by mouth 2 (two) times daily.  Marland Kitchen LORazepam (ATIVAN) 0.5 MG tablet 1-2 tablets twice daily as needed  . memantine (NAMENDA) 10 MG tablet Take 10 mg by mouth 2 (two) times daily.  . metoprolol (LOPRESSOR) 50 MG tablet Take 25 mg by mouth every morning.   . pantoprazole (PROTONIX) 40 MG tablet Take 1 tablet (40 mg total) by mouth daily.  . QUEtiapine (SEROQUEL) 50 MG tablet Take one tablet by mouth in AM and two tablets at bedtime (Patient taking differently: Take 150 mg by mouth at bedtime. )  . [DISCONTINUED] LORazepam (ATIVAN) 0.5 MG tablet Take 1 tablet (0.5 mg total) by mouth 2 (two) times daily as needed for anxiety.  . colchicine 0.6 MG tablet Take 1 tablet (0.6 mg total) by mouth 2 (two) times daily. (Patient not taking: Reported on 05/17/2014)  . [DISCONTINUED] HYDROcodone-homatropine (HYCODAN) 5-1.5 MG/5ML syrup Take 5 mLs by mouth every 8 (eight) hours as needed for cough.  . [DISCONTINUED] ipratropium (ATROVENT) 0.02 %  nebulizer solution Take 2.5 mLs (0.5 mg total) by nebulization 3 (three) times daily. Take for 5 days, then may use every 6 hours as needed.  . [DISCONTINUED] levalbuterol (XOPENEX) 0.63 MG/3ML nebulizer solution Take 3 mLs (0.63 mg total) by nebulization 3 (three) times daily. Use 3 times daily for 5 days, then use every 4 hours as needed for wheezing.  . [DISCONTINUED] levofloxacin (LEVAQUIN) 750 MG tablet Take 1 tablet (750 mg  total) by mouth daily.  . [DISCONTINUED] memantine (NAMENDA XR) 28 MG CP24 24 hr capsule Take 28 mg by mouth every morning.  . [DISCONTINUED] niacin (NIASPAN) 1000 MG CR tablet Take 1 tablet (1,000 mg total) by mouth at bedtime. (Patient not taking: Reported on 05/02/2014)      Objective:   Physical Exam  Constitutional: He appears well-developed and well-nourished.  Skin:  Soft tissue swelling with localized area of cellulitis on dorsum of right hand  Psychiatric:  Patient very jovial today and thoughts are connecting well. He is having a good day.          Assessment & Plan:  1. Cellulitis of other specified site I have instructed him to use warm compresses or soaks and Rx: Keflex 500 mg 1 twice a day for 5 days

## 2014-05-18 ENCOUNTER — Telehealth: Payer: Self-pay | Admitting: Family Medicine

## 2014-05-18 DIAGNOSIS — L03818 Cellulitis of other sites: Secondary | ICD-10-CM

## 2014-05-18 MED ORDER — CEPHALEXIN 500 MG PO CAPS: 500.0000 mg | ORAL_CAPSULE | Freq: Two times a day (BID) | ORAL | Status: DC

## 2014-05-18 NOTE — Telephone Encounter (Signed)
Called patient to inform that abx was sent to local pharmac.

## 2014-05-18 NOTE — Telephone Encounter (Signed)
Reordered Keflex to [facility]. Called and cancelled to [facility].

## 2014-05-18 NOTE — Telephone Encounter (Signed)
[name] at advance aware that patient had HgA1c on 2/10 and results have been faxed over.

## 2014-05-21 ENCOUNTER — Other Ambulatory Visit: Payer: Self-pay | Admitting: Cardiology

## 2014-05-23 ENCOUNTER — Other Ambulatory Visit: Payer: Self-pay | Admitting: Family Medicine

## 2014-05-24 NOTE — Telephone Encounter (Signed)
Last seen 05/17/14 Dr Miller 

## 2014-05-25 ENCOUNTER — Inpatient Hospital Stay (HOSPITAL_COMMUNITY): Payer: Medicare Other

## 2014-05-25 ENCOUNTER — Encounter (HOSPITAL_COMMUNITY): Payer: Self-pay | Admitting: Emergency Medicine

## 2014-05-25 ENCOUNTER — Inpatient Hospital Stay (HOSPITAL_COMMUNITY)
Admission: EM | Admit: 2014-05-25 | Discharge: 2014-05-27 | DRG: 871 | Disposition: A | Discharge status: Home Health | Payer: Medicare Other | Attending: Internal Medicine | Admitting: Internal Medicine

## 2014-05-25 ENCOUNTER — Emergency Department (HOSPITAL_COMMUNITY): Payer: Medicare Other

## 2014-05-25 DIAGNOSIS — R7989 Other specified abnormal findings of blood chemistry: Secondary | ICD-10-CM | POA: Diagnosis present

## 2014-05-25 DIAGNOSIS — Z809 Family history of malignant neoplasm, unspecified: Secondary | ICD-10-CM | POA: Diagnosis not present

## 2014-05-25 DIAGNOSIS — M1990 Unspecified osteoarthritis, unspecified site: Secondary | ICD-10-CM | POA: Diagnosis present

## 2014-05-25 DIAGNOSIS — E119 Type 2 diabetes mellitus without complications: Secondary | ICD-10-CM

## 2014-05-25 DIAGNOSIS — F0391 Unspecified dementia with behavioral disturbance: Secondary | ICD-10-CM | POA: Diagnosis present

## 2014-05-25 DIAGNOSIS — I25810 Atherosclerosis of coronary artery bypass graft(s) without angina pectoris: Secondary | ICD-10-CM | POA: Diagnosis present

## 2014-05-25 DIAGNOSIS — Z87891 Personal history of nicotine dependence: Secondary | ICD-10-CM | POA: Diagnosis not present

## 2014-05-25 DIAGNOSIS — E785 Hyperlipidemia, unspecified: Secondary | ICD-10-CM | POA: Diagnosis present

## 2014-05-25 DIAGNOSIS — K219 Gastro-esophageal reflux disease without esophagitis: Secondary | ICD-10-CM | POA: Diagnosis present

## 2014-05-25 DIAGNOSIS — Y95 Nosocomial condition: Secondary | ICD-10-CM | POA: Diagnosis present

## 2014-05-25 DIAGNOSIS — G9340 Encephalopathy, unspecified: Secondary | ICD-10-CM | POA: Diagnosis present

## 2014-05-25 DIAGNOSIS — Z8673 Personal history of transient ischemic attack (TIA), and cerebral infarction without residual deficits: Secondary | ICD-10-CM | POA: Diagnosis not present

## 2014-05-25 DIAGNOSIS — E1165 Type 2 diabetes mellitus with hyperglycemia: Secondary | ICD-10-CM | POA: Diagnosis present

## 2014-05-25 DIAGNOSIS — R0602 Shortness of breath: Secondary | ICD-10-CM | POA: Diagnosis not present

## 2014-05-25 DIAGNOSIS — E089 Diabetes mellitus due to underlying condition without complications: Secondary | ICD-10-CM | POA: Diagnosis not present

## 2014-05-25 DIAGNOSIS — E876 Hypokalemia: Secondary | ICD-10-CM | POA: Diagnosis present

## 2014-05-25 DIAGNOSIS — A419 Sepsis, unspecified organism: Principal | ICD-10-CM | POA: Diagnosis present

## 2014-05-25 DIAGNOSIS — I10 Essential (primary) hypertension: Secondary | ICD-10-CM | POA: Diagnosis present

## 2014-05-25 DIAGNOSIS — E872 Acidosis: Secondary | ICD-10-CM | POA: Diagnosis present

## 2014-05-25 DIAGNOSIS — Z833 Family history of diabetes mellitus: Secondary | ICD-10-CM | POA: Diagnosis not present

## 2014-05-25 DIAGNOSIS — I2510 Atherosclerotic heart disease of native coronary artery without angina pectoris: Secondary | ICD-10-CM | POA: Diagnosis present

## 2014-05-25 DIAGNOSIS — J189 Pneumonia, unspecified organism: Secondary | ICD-10-CM | POA: Diagnosis present

## 2014-05-25 DIAGNOSIS — J449 Chronic obstructive pulmonary disease, unspecified: Secondary | ICD-10-CM | POA: Diagnosis present

## 2014-05-25 DIAGNOSIS — R6520 Severe sepsis without septic shock: Secondary | ICD-10-CM | POA: Diagnosis present

## 2014-05-25 LAB — URINALYSIS, ROUTINE W REFLEX MICROSCOPIC
Bilirubin Urine: NEGATIVE
Glucose, UA: >1000 mg/dL — AB
Hgb urine dipstick: NEGATIVE
LEUKOCYTES UA: NEGATIVE
NITRITE: NEGATIVE
PROTEIN: NEGATIVE mg/dL
Specific Gravity, Urine: 1.02 (ref 1.005–1.030)
Urobilinogen, UA: 0.2 mg/dL (ref 0.0–1.0)
pH: 6 (ref 5.0–8.0)

## 2014-05-25 LAB — CBC WITH DIFFERENTIAL/PLATELET
Basophils Absolute: 0 10*3/uL (ref 0.0–0.1)
Basophils Relative: 1 % (ref 0–1)
Eosinophils Absolute: 0.2 10*3/uL (ref 0.0–0.7)
Eosinophils Relative: 3 % (ref 0–5)
HCT: 36.8 % — ABNORMAL LOW (ref 39.0–52.0)
HEMOGLOBIN: 11.9 g/dL — AB (ref 13.0–17.0)
LYMPHS ABS: 0.8 10*3/uL (ref 0.7–4.0)
LYMPHS PCT: 12 % (ref 12–46)
MCH: 32 pg (ref 26.0–34.0)
MCHC: 32.3 g/dL (ref 30.0–36.0)
MCV: 98.9 fL (ref 78.0–100.0)
MONO ABS: 0.7 10*3/uL (ref 0.1–1.0)
MONOS PCT: 11 % (ref 3–12)
NEUTROS ABS: 4.9 10*3/uL (ref 1.7–7.7)
NEUTROS PCT: 73 % (ref 43–77)
Platelets: 209 10*3/uL (ref 150–400)
RBC: 3.72 MIL/uL — ABNORMAL LOW (ref 4.22–5.81)
RDW: 14 % (ref 11.5–15.5)
WBC: 6.6 10*3/uL (ref 4.0–10.5)

## 2014-05-25 LAB — BASIC METABOLIC PANEL
Anion gap: 9 (ref 5–15)
BUN: 15 mg/dL (ref 6–23)
CHLORIDE: 105 mmol/L (ref 96–112)
CO2: 25 mmol/L (ref 19–32)
Calcium: 7.7 mg/dL — ABNORMAL LOW (ref 8.4–10.5)
Creatinine, Ser: 1.27 mg/dL (ref 0.50–1.35)
GFR calc Af Amer: 60 mL/min — ABNORMAL LOW (ref >90)
GFR calc non Af Amer: 52 mL/min — ABNORMAL LOW (ref >90)
Glucose, Bld: 318 mg/dL — ABNORMAL HIGH (ref 70–99)
POTASSIUM: 3 mmol/L — AB (ref 3.5–5.1)
Sodium: 139 mmol/L (ref 135–145)

## 2014-05-25 LAB — COMPREHENSIVE METABOLIC PANEL
ALT: 15 U/L (ref 0–53)
ANION GAP: 11 (ref 5–15)
AST: 20 U/L (ref 0–37)
Albumin: 3.3 g/dL — ABNORMAL LOW (ref 3.5–5.2)
Alkaline Phosphatase: 49 U/L (ref 39–117)
BUN: 16 mg/dL (ref 6–23)
CHLORIDE: 102 mmol/L (ref 96–112)
CO2: 25 mmol/L (ref 19–32)
CREATININE: 1.34 mg/dL (ref 0.50–1.35)
Calcium: 8.2 mg/dL — ABNORMAL LOW (ref 8.4–10.5)
GFR calc Af Amer: 56 mL/min — ABNORMAL LOW (ref >90)
GFR, EST NON AFRICAN AMERICAN: 48 mL/min — AB (ref >90)
Glucose, Bld: 235 mg/dL — ABNORMAL HIGH (ref 70–99)
Potassium: 3.8 mmol/L (ref 3.5–5.1)
Sodium: 138 mmol/L (ref 135–145)
Total Bilirubin: 0.4 mg/dL (ref 0.3–1.2)
Total Protein: 6.8 g/dL (ref 6.0–8.3)

## 2014-05-25 LAB — INFLUENZA PANEL BY PCR (TYPE A & B)
H1N1FLUPCR: NOT DETECTED
Influenza A By PCR: NEGATIVE
Influenza B By PCR: NEGATIVE

## 2014-05-25 LAB — PROTIME-INR
INR: 1.11 (ref 0.00–1.49)
Prothrombin Time: 14.4 seconds (ref 11.6–15.2)

## 2014-05-25 LAB — BLOOD GAS, ARTERIAL
ACID-BASE DEFICIT: 3.7 mmol/L — AB (ref 0.0–2.0)
Bicarbonate: 20.8 mEq/L (ref 20.0–24.0)
FIO2: 0.21 %
O2 Saturation: 95.5 %
PCO2 ART: 37.8 mmHg (ref 35.0–45.0)
Patient temperature: 37
TCO2: 19 mmol/L (ref 0–100)
pH, Arterial: 7.36 (ref 7.350–7.450)
pO2, Arterial: 82.3 mmHg (ref 80.0–100.0)

## 2014-05-25 LAB — GLUCOSE, CAPILLARY
GLUCOSE-CAPILLARY: 197 mg/dL — AB (ref 70–99)
GLUCOSE-CAPILLARY: 134 mg/dL — AB (ref 70–99)
Glucose-Capillary: 316 mg/dL — ABNORMAL HIGH (ref 70–99)
Glucose-Capillary: 312 mg/dL — ABNORMAL HIGH (ref 70–99)
Glucose-Capillary: 332 mg/dL — ABNORMAL HIGH (ref 70–99)

## 2014-05-25 LAB — TROPONIN I: Troponin I: <0.03 ng/mL (ref <0.031)

## 2014-05-25 LAB — CBC
HCT: 32.8 % — ABNORMAL LOW (ref 39.0–52.0)
Hemoglobin: 10.5 g/dL — ABNORMAL LOW (ref 13.0–17.0)
MCH: 31.5 pg (ref 26.0–34.0)
MCHC: 32 g/dL (ref 30.0–36.0)
MCV: 98.5 fL (ref 78.0–100.0)
Platelets: 193 10*3/uL (ref 150–400)
RBC: 3.33 MIL/uL — ABNORMAL LOW (ref 4.22–5.81)
RDW: 14.1 % (ref 11.5–15.5)
WBC: 7 10*3/uL (ref 4.0–10.5)

## 2014-05-25 LAB — LACTIC ACID, PLASMA
LACTIC ACID, VENOUS: 4.1 mmol/L — AB (ref 0.5–2.0)
LACTIC ACID, VENOUS: 4.4 mmol/L — AB (ref 0.5–2.0)
Lactic Acid, Venous: 3.3 mmol/L (ref 0.5–2.0)
Lactic Acid, Venous: 5.5 mmol/L (ref 0.5–2.0)
Lactic Acid, Venous: 1.6 mmol/L (ref 0.5–2.0)

## 2014-05-25 LAB — APTT: aPTT: 33 seconds (ref 24–37)

## 2014-05-25 LAB — PROCALCITONIN

## 2014-05-25 LAB — STREP PNEUMONIAE URINARY ANTIGEN: Strep Pneumo Urinary Antigen: NEGATIVE

## 2014-05-25 LAB — URINE MICROSCOPIC-ADD ON

## 2014-05-25 LAB — MRSA PCR SCREENING: MRSA by PCR: NEGATIVE

## 2014-05-25 MED ORDER — ASPIRIN EC 81 MG PO TBEC
81.0000 mg | DELAYED_RELEASE_TABLET | Freq: Two times a day (BID) | ORAL | Status: DC
  Administered 2014-05-25 – 2014-05-27 (×5): 81 mg via ORAL
  Filled 2014-05-25 (×5): qty 1

## 2014-05-25 MED ORDER — SODIUM CHLORIDE 0.9 % IV BOLUS (SEPSIS)
1000.0000 mL | Freq: Once | INTRAVENOUS | Status: AC
  Administered 2014-05-25: 1000 mL via INTRAVENOUS

## 2014-05-25 MED ORDER — SIMVASTATIN 20 MG PO TABS
20.0000 mg | ORAL_TABLET | Freq: Every day | ORAL | Status: DC
  Administered 2014-05-25 – 2014-05-26 (×2): 20 mg via ORAL
  Filled 2014-05-25 (×2): qty 1

## 2014-05-25 MED ORDER — IOHEXOL 300 MG/ML  SOLN: 50.0000 mL | Freq: Once | INTRAMUSCULAR | Status: DC | PRN

## 2014-05-25 MED ORDER — INSULIN ASPART 100 UNIT/ML ~~LOC~~ SOLN
0.0000 [IU] | Freq: Three times a day (TID) | SUBCUTANEOUS | Status: DC
  Administered 2014-05-25: 11 [IU] via SUBCUTANEOUS
  Administered 2014-05-25: 3 [IU] via SUBCUTANEOUS
  Administered 2014-05-25: 11 [IU] via SUBCUTANEOUS
  Administered 2014-05-26 – 2014-05-27 (×4): 2 [IU] via SUBCUTANEOUS

## 2014-05-25 MED ORDER — IOHEXOL 300 MG/ML  SOLN
50.0000 mL | Freq: Once | INTRAMUSCULAR | Status: AC | PRN
  Administered 2014-05-25: 50 mL via ORAL

## 2014-05-25 MED ORDER — QUETIAPINE FUMARATE 50 MG PO TABS
50.0000 mg | ORAL_TABLET | Freq: Every day | ORAL | Status: DC
  Filled 2014-05-25 (×3): qty 1

## 2014-05-25 MED ORDER — ALBUTEROL (5 MG/ML) CONTINUOUS INHALATION SOLN
10.0000 mg/h | INHALATION_SOLUTION | RESPIRATORY_TRACT | Status: DC
  Administered 2014-05-25: 10 mg/h via RESPIRATORY_TRACT
  Filled 2014-05-25: qty 20

## 2014-05-25 MED ORDER — POTASSIUM CHLORIDE CRYS ER 20 MEQ PO TBCR
40.0000 meq | EXTENDED_RELEASE_TABLET | Freq: Four times a day (QID) | ORAL | Status: AC
  Administered 2014-05-25 (×2): 40 meq via ORAL
  Filled 2014-05-25 (×2): qty 2

## 2014-05-25 MED ORDER — QUETIAPINE FUMARATE 50 MG PO TABS
50.0000 mg | ORAL_TABLET | Freq: Two times a day (BID) | ORAL | Status: DC
Start: 2014-05-25 — End: 2014-05-25
  Administered 2014-05-25: 50 mg via ORAL
  Filled 2014-05-25: qty 2
  Filled 2014-05-25 (×6): qty 1

## 2014-05-25 MED ORDER — METOPROLOL TARTRATE 25 MG PO TABS
25.0000 mg | ORAL_TABLET | Freq: Every morning | ORAL | Status: DC
  Administered 2014-05-25 – 2014-05-27 (×3): 25 mg via ORAL
  Filled 2014-05-25 (×3): qty 1

## 2014-05-25 MED ORDER — SODIUM CHLORIDE 0.9 % IV BOLUS (SEPSIS)
2000.0000 mL | Freq: Once | INTRAVENOUS | Status: AC
  Administered 2014-05-25: 2000 mL via INTRAVENOUS

## 2014-05-25 MED ORDER — IOHEXOL 300 MG/ML  SOLN
100.0000 mL | Freq: Once | INTRAMUSCULAR | Status: AC | PRN
  Administered 2014-05-25: 100 mL via INTRAVENOUS

## 2014-05-25 MED ORDER — CLOPIDOGREL BISULFATE 75 MG PO TABS
75.0000 mg | ORAL_TABLET | Freq: Every day | ORAL | Status: DC
  Administered 2014-05-25 – 2014-05-27 (×3): 75 mg via ORAL
  Filled 2014-05-25 (×3): qty 1

## 2014-05-25 MED ORDER — OSELTAMIVIR PHOSPHATE 75 MG PO CAPS
75.0000 mg | ORAL_CAPSULE | Freq: Two times a day (BID) | ORAL | Status: DC
  Administered 2014-05-25: 75 mg via ORAL
  Filled 2014-05-25: qty 1

## 2014-05-25 MED ORDER — COLCHICINE 0.6 MG PO TABS
0.6000 mg | ORAL_TABLET | Freq: Two times a day (BID) | ORAL | Status: DC
  Administered 2014-05-25: 0.6 mg via ORAL
  Filled 2014-05-25: qty 1

## 2014-05-25 MED ORDER — PIPERACILLIN-TAZOBACTAM 3.375 G IVPB
3.3750 g | Freq: Once | INTRAVENOUS | Status: AC
  Administered 2014-05-25: 3.375 g via INTRAVENOUS
  Filled 2014-05-25: qty 50

## 2014-05-25 MED ORDER — ACETAMINOPHEN 325 MG PO TABS
650.0000 mg | ORAL_TABLET | Freq: Once | ORAL | Status: AC
  Administered 2014-05-25: 650 mg via ORAL
  Filled 2014-05-25: qty 2

## 2014-05-25 MED ORDER — METHYLPREDNISOLONE SODIUM SUCC 125 MG IJ SOLR
125.0000 mg | Freq: Once | INTRAMUSCULAR | Status: AC
  Administered 2014-05-25: 125 mg via INTRAVENOUS
  Filled 2014-05-25: qty 2

## 2014-05-25 MED ORDER — POLYSACCHARIDE IRON COMPLEX 150 MG PO CAPS
150.0000 mg | ORAL_CAPSULE | Freq: Two times a day (BID) | ORAL | Status: DC
  Administered 2014-05-25 – 2014-05-27 (×5): 150 mg via ORAL
  Filled 2014-05-25 (×5): qty 1

## 2014-05-25 MED ORDER — PANTOPRAZOLE SODIUM 40 MG PO TBEC
40.0000 mg | DELAYED_RELEASE_TABLET | Freq: Every day | ORAL | Status: DC
  Administered 2014-05-25 – 2014-05-27 (×3): 40 mg via ORAL
  Filled 2014-05-25 (×3): qty 1

## 2014-05-25 MED ORDER — DEXTROSE 5 % IV SOLN
INTRAVENOUS | Status: AC
  Filled 2014-05-25: qty 1

## 2014-05-25 MED ORDER — INSULIN GLARGINE 100 UNIT/ML ~~LOC~~ SOLN
12.0000 [IU] | Freq: Every day | SUBCUTANEOUS | Status: DC
  Administered 2014-05-25 – 2014-05-26 (×2): 12 [IU] via SUBCUTANEOUS
  Filled 2014-05-25 (×3): qty 0.12

## 2014-05-25 MED ORDER — VANCOMYCIN HCL IN DEXTROSE 750-5 MG/150ML-% IV SOLN
750.0000 mg | Freq: Two times a day (BID) | INTRAVENOUS | Status: DC
  Administered 2014-05-25 – 2014-05-27 (×5): 750 mg via INTRAVENOUS
  Filled 2014-05-25 (×7): qty 150

## 2014-05-25 MED ORDER — QUETIAPINE FUMARATE 25 MG PO TABS
100.0000 mg | ORAL_TABLET | Freq: Every day | ORAL | Status: DC
  Filled 2014-05-25 (×2): qty 4

## 2014-05-25 MED ORDER — VANCOMYCIN HCL IN DEXTROSE 1-5 GM/200ML-% IV SOLN
1000.0000 mg | Freq: Once | INTRAVENOUS | Status: AC
  Administered 2014-05-25: 1000 mg via INTRAVENOUS
  Filled 2014-05-25: qty 200

## 2014-05-25 MED ORDER — MEMANTINE HCL 10 MG PO TABS
10.0000 mg | ORAL_TABLET | Freq: Two times a day (BID) | ORAL | Status: DC
  Administered 2014-05-25 – 2014-05-27 (×5): 10 mg via ORAL
  Filled 2014-05-25 (×5): qty 1

## 2014-05-25 MED ORDER — SODIUM CHLORIDE 0.9 % IV SOLN
INTRAVENOUS | Status: DC
  Administered 2014-05-25 (×2): via INTRAVENOUS

## 2014-05-25 MED ORDER — HEPARIN SODIUM (PORCINE) 5000 UNIT/ML IJ SOLN
5000.0000 [IU] | Freq: Three times a day (TID) | INTRAMUSCULAR | Status: DC
  Administered 2014-05-25 – 2014-05-27 (×6): 5000 [IU] via SUBCUTANEOUS
  Filled 2014-05-25 (×6): qty 1

## 2014-05-25 MED ORDER — INSULIN ASPART 100 UNIT/ML ~~LOC~~ SOLN: 0.0000 [IU] | Freq: Every day | SUBCUTANEOUS | Status: DC

## 2014-05-25 MED ORDER — CEFEPIME HCL 1 G IJ SOLR
1.0000 g | Freq: Three times a day (TID) | INTRAMUSCULAR | Status: DC
  Administered 2014-05-25: 1 g via INTRAVENOUS
  Filled 2014-05-25 (×2): qty 1

## 2014-05-25 MED ORDER — DEXTROSE 5 % IV SOLN
1.0000 g | INTRAVENOUS | Status: DC
  Administered 2014-05-27: 1 g via INTRAVENOUS
  Filled 2014-05-25 (×3): qty 1

## 2014-05-25 MED ORDER — QUETIAPINE FUMARATE 25 MG PO TABS
50.0000 mg | ORAL_TABLET | Freq: Every day | ORAL | Status: DC
  Filled 2014-05-25 (×3): qty 2

## 2014-05-25 MED ORDER — SODIUM CHLORIDE 0.9 % IV SOLN
INTRAVENOUS | Status: AC
  Administered 2014-05-25: 03:00:00 via INTRAVENOUS

## 2014-05-25 MED ORDER — INSULIN ASPART 100 UNIT/ML ~~LOC~~ SOLN: 0.0000 [IU] | Freq: Three times a day (TID) | SUBCUTANEOUS | Status: DC

## 2014-05-25 MED ORDER — LORAZEPAM 0.5 MG PO TABS
0.5000 mg | ORAL_TABLET | Freq: Four times a day (QID) | ORAL | Status: DC | PRN
  Administered 2014-05-25: 0.5 mg via ORAL
  Filled 2014-05-25: qty 1

## 2014-05-25 NOTE — Progress Notes (Signed)
Patient received in ICU at 1320. Vital signs stable. Patient alert and oriented, voices no complaints. Abdomen taut, nontender.

## 2014-05-25 NOTE — ED Notes (Signed)
CRITICAL VALUE ALERT  Critical value received:  Lactic acid 3.3  Date of notification:  05/25/14  Time of notification:  0203  Critical value read back: yes  Nurse who received alert:  Juanita LasterAmanda Thu Baggett, RN  MD notified (1st page):  Ward  Time of first page:  0205  MD notified (2nd page):  Time of second page:  Responding MD:  Ward  Time MD responded:  [phone]

## 2014-05-25 NOTE — Progress Notes (Signed)
ANTIBIOTIC CONSULT NOTE - INITIAL  Pharmacy Consult for Vancomycin and Cefepime Renal adjustment of ABX as needed.  Indication: pneumonia  Allergies  Allergen Reactions  . Fexofenadine Hcl Other (See Comments)    Heart attack symptoms  . Flonase [Fluticasone Propionate] Other (See Comments)    Heart attack symptoms  . Nasal Spray Other (See Comments)    Heart attack symptoms   . Nasal Spray     CAN NOT TOLERATE ANY NASAL SPRAYS!!!!!  Causes heart attack symptoms   Patient Measurements: Height: 6' (182.9 cm) Weight: 200 lb (90.719 kg) IBW/kg (Calculated) : 77.6  Vital Signs: Temp: 97.4 F (36.3 C) (03/25 0927) Temp Source: Oral (03/25 0927) BP: 141/68 mmHg (03/25 0927) Pulse Rate: 94 (03/25 0927) Intake/Output from previous day: 03/24 0701 - 03/25 0700 In: -  Out: 225 [Urine:225] Intake/Output from this shift: Total I/O In: 240 [P.O.:240] Out: 200 [Urine:200]  Labs:  Recent Labs  05/25/14 0100 05/25/14 0447  WBC 6.6 7.0  HGB 11.9* 10.5*  PLT 209 193  CREATININE 1.34 1.27   Estimated Creatinine Clearance: 50.9 mL/min (by C-G formula based on Cr of 1.27). No results for input(s): VANCOTROUGH, VANCOPEAK, VANCORANDOM, GENTTROUGH, GENTPEAK, GENTRANDOM, TOBRATROUGH, TOBRAPEAK, TOBRARND, AMIKACINPEAK, AMIKACINTROU, AMIKACIN in the last 72 hours.   Microbiology: Recent Results (from the past 720 hour(s))  Urine culture     Status: None   Collection Time: 05/06/14  7:55 PM  Result Value Ref Range Status   Specimen Description URINE, CATHETERIZED  Final   Special Requests NONE  Final   Colony Count NO GROWTH Performed at [facility]   Final   Culture NO GROWTH Performed at [facility]   Final   Report Status 05/08/2014 FINAL  Final  Urine culture     Status: None   Collection Time: 05/11/14 11:21 AM  Result Value Ref Range Status   Urine Culture, Routine Final report  Final   Result 1 No growth  Final   Medical History: Past Medical  History  Diagnosis Date  . Diabetes mellitus type 2   . GERD (gastroesophageal reflux disease)   . Hyperlipidemia     takes Niacin nightly  . DJD (degenerative joint disease)   . Hypertension     takes Metoprolol and Ramipril daily  . Dizziness   . Gout     hx of--5033yrs ago  . Bruises easily     pt is on Plavix and ASA  . Shortness of breath     wakes pt up during night with occ shortness of breathe;states it happens about once a month  . Impaired hearing   . Urinary frequency   . UTI (lower urinary tract infection)     hx of--03/2010  . Anemia     takes Glyburide bid  . CAD (coronary artery disease) of artery bypass graft   . Pneumonia     "at least twice that I know of"  . Seizures 05/14/11    "first time was today"  . CVA (cerebral vascular accident) ~ 2009    right arm/leg occ hurts  . Carotid artery occlusion 2013    s/p bilateral endarterectomy  . Chronic headache   . Chronic neck pain   . Dementia with behavioral disturbance   . Dyspnea     chronic, daily   Vancomycin 3/25 >> Cefepime 3/25 >>  Assessment: 81yo male admitted with SOB, fever, and suspected pneumonia.  Pt received initial doses of IV ABX on admission.  Estimated  Normalized ClCr ~ 45-50  Goal of Therapy:  Vancomycin trough level 15-20 mcg/ml Eradicate infection.  Plan:  Vancomycin  IV q12hrs Check trough at steady state Cefepime 1gm IV q24hrs Monitor labs, renal fxn, and cultures  Valrie Hart A 05/25/2014,11:03 AM

## 2014-05-25 NOTE — Progress Notes (Signed)
Difficult to arrouse to name.  Congested cough.  Nurse Supervisor on floor, informed of patient's status.  O2 at 2L applied.  Remains clammy, CBG was 316.  HOB elevated.  Lab tech in to collect blood specimen.

## 2014-05-25 NOTE — Care Management Note (Signed)
    Page 1 of 1   05/25/2014     12:42:49 PM CARE MANAGEMENT NOTE 05/25/2014  Patient:  [name],[name]   Account Number:  [id number]  Date Initiated:  05/25/2014  Documentation initiated by:  Kathyrn SheriffHILDRESS,JESSICA  Subjective/Objective Assessment:   Pt is from home, lives alone but has 24/7 supervision through PD aids. [facility] Pt has a cane/walker/wheelchair but only uses the walker PRN. Pt has a neb machine. No other CM needs.     Action/Plan:   Pt plans to discharge home with resumption of PD and HH services. Pt moving to SD unit today. Will cont to follow for CM needs.   Anticipated DC Date:  05/29/2014   Anticipated DC Plan:  HOME W HOME HEALTH SERVICES      DC Planning Services  CM consult      [facility]  Resumption Of Svcs/PTA Zaniel [place]au   Choice offered to / List presented to:             Status of service:  In process, will continue to follow Medicare Important Message given?  YES (If response is "NO", the following Medicare IM given date fields will be blank) Date Medicare IM given:  05/25/2014 Medicare IM given by:  Kathyrn SheriffHILDRESS,JESSICA Date Additional Medicare IM given:   Additional Medicare IM given by:    Discharge Disposition:  HOME W HOME HEALTH SERVICES  Per UR Regulation:  Reviewed for med. necessity/level of care/duration of stay  If discussed at Long Length of Stay Meetings, dates discussed:    Comments:  05/25/2014 1200 Kathyrn SheriffJessica Childress, RN, MSN, CM

## 2014-05-25 NOTE — Progress Notes (Signed)
CRITICAL VALUE ALERT  Critical value received:  Lactic acid  Date of notification: 05/25/14  Time of notification:  0750 Critical value read back:yes  Nurse who received alert: Ty HiltsKaren Marye Eagen, RN  MD notified (1st page):  [phone]  Responding MD:  Dhungel  Time MD responded:  [phone]

## 2014-05-25 NOTE — Care Management Utilization Note (Signed)
UR completed 

## 2014-05-25 NOTE — ED Notes (Addendum)
Pt. C/o shortness of breath. EMS reports pt. Diaphoretic on scene. Not diaphoretic at present. Pt. Denies chest pain.

## 2014-05-25 NOTE — ED Notes (Addendum)
Pt. Son can be reached at  [phone]

## 2014-05-25 NOTE — Progress Notes (Signed)
Pt transferred to ICU. Report called and given to [facility] R[facility]. All questions were answered and no further questions at this time.

## 2014-05-25 NOTE — H&P (Signed)
[facility] History and Physical  [name] WJX:[id number] DOB: [birth year] DOA: 05/25/2014  Referring physician: EDP PCP: Frederica Kuster, MD   Chief Complaint: SOB   HPI: [name] is a 81 y.o. male who presents to the ED today with c/o sudden onset SOB, cough, wet breath sounds.  Symptoms onset 2 hours PTA, developed fever and initially dry cough which has now become wet sounding following fluid resuscitation in the ED.  Patient felt fine yesterday.  Was recently admitted on March 6 for UTI with encephalopathy, and even had a stress test around that time as well.  Patient has also been more confused today per family.  Review of Systems: Systems reviewed.  As above, otherwise negative  Past Medical History  Diagnosis Date  . Diabetes mellitus type 2   . GERD (gastroesophageal reflux disease)   . Hyperlipidemia     takes Niacin nightly  . DJD (degenerative joint disease)   . Hypertension     takes Metoprolol and Ramipril daily  . Dizziness   . Gout     hx of--39yrs ago  . Bruises easily     pt is on Plavix and ASA  . Shortness of breath     wakes pt up during night with occ shortness of breathe;states it happens about once a month  . Impaired hearing   . Urinary frequency   . UTI (lower urinary tract infection)     hx of--03/2010  . Anemia     takes Glyburide bid  . CAD (coronary artery disease) of artery bypass graft   . Pneumonia     "at least twice that I know of"  . Seizures 05/14/11    "first time was today"  . CVA (cerebral vascular accident) ~ 2009    right arm/leg occ hurts  . Carotid artery occlusion 2013    s/p bilateral endarterectomy  . Chronic headache   . Chronic neck pain   . Dementia with behavioral disturbance   . Dyspnea     chronic, daily   Past Surgical History  Procedure Laterality Date  . Appendectomy  1968  . Colonoscopy    . Endarterectomy  04/09/2011    Procedure: ENDARTERECTOMY CAROTID;  Surgeon: Chuck Hint, MD;  Location: [facility] OR;  Service: Vascular;  Laterality: Left;  Left Carotid endarterectomy With Dacron Patch Angioplasty  . Cardiac catheterization  1998    Dr. Antoine Poche  . Endarterectomy  05/08/2011    Procedure: ENDARTERECTOMY CAROTID;  Surgeon: Chuck Hint, MD;  Location: [facility] OR;  Service: Vascular;  Laterality: Right;  with [place] Patch Angioplasty  . Cataract extraction w/ intraocular lens  implant, bilateral  2012  . Coronary artery bypass graft  1998    LIMA to LAD, SVG to diagonal, SVG to PDA, SVG to posterior lateral.   . Eye surgery  1953    right ; "piece of metal removed"  . Eye surgery  2012    Cataract- Bilateral   Social History:  reports that he quit smoking about 40 years ago. His smoking use included Cigarettes. He has a 20 pack-year smoking history. He has never used smokeless tobacco. He reports that he does not drink alcohol or use illicit drugs.  Allergies  Allergen Reactions  . Fexofenadine Hcl Other (See Comments)    Heart attack symptoms  . Flonase [Fluticasone Propionate] Other (See Comments)    Heart attack symptoms  . Nasal Spray Other (See Comments)    Heart  attack symptoms   . Nasal Spray     CAN NOT TOLERATE ANY NASAL SPRAYS!!!!!  Causes heart attack symptoms    Family History  Problem Relation Age of Onset  . Diabetes Sister   . Cancer Brother   . Diabetes Brother   . Anesthesia problems Neg Hx   . Hypotension Neg Hx   . Malignant hyperthermia Neg Hx   . Pseudochol deficiency Neg Hx   . Hearing loss Father      Prior to Admission medications   Medication Sig Start Date End Date Taking? Authorizing Provider  ACCU-CHEK AVIVA PLUS test strip TEST TWICE A DAY 03/16/13   Ileana Ladd, MD  aspirin EC 81 MG EC tablet Take 1 tablet (81 mg total) by mouth 2 (two) times daily. 03/21/12   Rollene Rotunda, MD  cephALEXin (KEFLEX) 500 MG capsule Take 1 capsule (500 mg total) by mouth 2 (two) times daily. 05/18/14   Frederica Kuster, MD   clopidogrel (PLAVIX) 75 MG tablet TAKE 1 TABLET DAILY 05/14/14   Rollene Rotunda, MD  colchicine 0.6 MG tablet Take 1 tablet (0.6 mg total) by mouth 2 (two) times daily. Patient not taking: Reported on 05/17/2014 05/07/14   Gwenyth Bender, NP  iron polysaccharides (NIFEREX) 150 MG capsule Take 1 capsule (150 mg total) by mouth 2 (two) times daily. 05/07/14   Gwenyth Bender, NP  LORazepam (ATIVAN) 0.5 MG tablet 1-2 tablets twice daily as needed 05/17/14   Frederica Kuster, MD  memantine (NAMENDA) 10 MG tablet Take 10 mg by mouth 2 (two) times daily.    Historical Provider, MD  metoprolol (LOPRESSOR) 50 MG tablet Take 25 mg by mouth every morning.     Historical Provider, MD  pantoprazole (PROTONIX) 40 MG tablet Take 1 tablet (40 mg total) by mouth daily. 05/07/14   Gwenyth Bender, NP  QUEtiapine (SEROQUEL) 50 MG tablet TAKE 1 TABLET BY MOUTH IN THE MORNING AND 2 TABLETS AT BEDTIME 05/24/14   Frederica Kuster, MD  simvastatin (ZOCOR) 20 MG tablet Take 1 tablet (20 mg total) by mouth daily at 6 PM. 05/22/14   Rollene Rotunda, MD   Physical Exam: Filed Vitals:   05/25/14 0346  BP:   Pulse:   Temp: 98 F (36.7 C)  Resp:     BP 106/51 mmHg  Pulse 105  Temp(Src) 98 F (36.7 C) (Oral)  Resp 16  Ht 6' (1.829 m)  Wt 90.719 kg (200 lb)  BMI 27.12 kg/m2  SpO2 92%  General Appearance:    Alert, oriented, no distress, appears stated age  Head:    Normocephalic, atraumatic  Eyes:    PERRL, EOMI, sclera non-icteric        Nose:   Nares without drainage or epistaxis. Mucosa, turbinates normal  Throat:   Moist mucous membranes. Oropharynx without erythema or exudate.  Neck:   Supple. No carotid bruits.  No thyromegaly.  No lymphadenopathy.   Back:     No CVA tenderness, no spinal tenderness  Lungs:     Initially wheezy during EDP evaluation, patients breath sounds and cough is now wet sounding.  Chest wall:    No tenderness to palpitation  Heart:    Regular rate and rhythm without murmurs, gallops, rubs   Abdomen:     Soft, non-tender, nondistended, normal bowel sounds, no organomegaly  Genitalia:    deferred  Rectal:    deferred  Extremities:   No clubbing, cyanosis or edema.  Pulses:   2+ and symmetric all extremities  Skin:   Skin color, texture, turgor normal, no rashes or lesions  Lymph nodes:   Cervical, supraclavicular, and axillary nodes normal  Neurologic:   CNII-XII intact. Normal strength, sensation and reflexes      throughout    Labs on Admission:  Basic Metabolic Panel:  Recent Labs Lab 05/25/14 0100  NA 138  K 3.8  CL 102  CO2 25  GLUCOSE 235*  BUN 16  CREATININE 1.34  CALCIUM 8.2*   Liver Function Tests:  Recent Labs Lab 05/25/14 0100  AST 20  ALT 15  ALKPHOS 49  BILITOT 0.4  PROT 6.8  ALBUMIN 3.3*   No results for input(s): LIPASE, AMYLASE in the last 168 hours. No results for input(s): AMMONIA in the last 168 hours. CBC:  Recent Labs Lab 05/25/14 0100  WBC 6.6  NEUTROABS 4.9  HGB 11.9*  HCT 36.8*  MCV 98.9  PLT 209   Cardiac Enzymes:  Recent Labs Lab 05/25/14 0100  TROPONINI <0.03    BNP (last 3 results) No results for input(s): PROBNP in the last 8760 hours. CBG: No results for input(s): GLUCAP in the last 168 hours.  Radiological Exams on Admission: Dg Chest Port 1 View  05/25/2014   CLINICAL DATA:  Acute onset of shortness of breath. Nonproductive cough and fever. Initial encounter.  EXAM: PORTABLE CHEST - 1 VIEW  COMPARISON:  Chest radiograph performed 05/06/2014  FINDINGS: The lungs are mildly hypoexpanded. Mild left basilar opacity may reflect atelectasis or mild pneumonia. There is no evidence of pleural effusion or pneumothorax.  The cardiomediastinal silhouette is borderline enlarged. The patient is status post median sternotomy. No acute osseous abnormalities are seen.  IMPRESSION: Lungs mildly hypoexpanded. Mild left basilar airspace opacity may reflect atelectasis or mild pneumonia. Borderline cardiomegaly.    Electronically Signed   By: Roanna RaiderJeffery  Chang M.D.   On: 05/25/2014 01:09    EKG: Independently reviewed.  Assessment/Plan Principal Problem:   HCAP (healthcare-associated pneumonia) Active Problems:   Diabetes mellitus   Sepsis   1. Sepsis secondary to HCAP - LLL PNA seen on CXR 1. IVF resuscitation done in ED 2. Continue NS at 125 cc/hr 3. Tylenol for fever 4. Cefepime and vanc for HCAP coverage 5. Cultures pending 2. Lactic acidosis - mild elevation in lactate, recheck at 0500 today, suspect this has already improved with hydration and patient states he feels much better after ED treatment 3. DM2 - appears diet controlled at home, will put him on carb mod diet and low dose SSI while here CBG checks AC/HS    Code Status: Full Code  Family Communication: No family in room Disposition Plan: Admit to inpatient   Time spent: 70 min  Davonne Jarnigan M. [facility] Pager [phone]  If 7AM-7PM, please contact the day team taking care of the patient [email or web address] Password [email or web address] 05/25/2014, 4:39 AM

## 2014-05-25 NOTE — Progress Notes (Signed)
CRITICAL VALUE ALERT  Critical value received:  Lactic Acid 5.5  Date of notification:  05/25/2014  Time of notification:  1102  Critical value read back:Yes  Nurse who received alert:  Johnsie CancelLisa Kovack  MD notified (1st page):  Dr. Gonzella Lexhungel  Time of first page:  1110  MD notified (2nd page):  Time of second page:  Responding MD:  Dr. Gonzella Lexhungel  Time MD responded:  1110

## 2014-05-25 NOTE — ED Provider Notes (Signed)
This chart was scribed for [name] Floretta Petro, DO by Bronson Curb, ED Scribe. This patient was seen in room APA03/APA03 and the patient's care was started at 12:23 AM.  TIME SEEN: 0023  CHIEF COMPLAINT: Shortness of Breath  LEVEL 5 CAVEAT: Altered mental status  HPI:   HPI Comments: [name] is a 81 y.o. male with history of diabetes, hyperlipidemia, hypertension, CAD status post CABG, COPD not on oxygen who presents to the Emergency Department complaining of sudden onset shortness of breath that began approximately 2 hours ago. Son states he noticed the patient become short of breath while he was in the recliner. Patient notes associated fever and dry cough. He denies any chest pain. Son reports the patient was asymptomatic yesterday. Patient was recently admitted on March 6 for fever and UTI and encephalopathy. Family report the patient's last stress test was approximately 2 weeks ago. They report they feel that he has been more confused today. Does have a documented history of dementia.    ZOX:WRUEAV, Bertram Millard, MD    ROS: Level V caveat for altered mental status   PAST MEDICAL HISTORY/PAST SURGICAL HISTORY:  Past Medical History  Diagnosis Date  . Diabetes mellitus type 2   . GERD (gastroesophageal reflux disease)   . Hyperlipidemia     takes Niacin nightly  . DJD (degenerative joint disease)   . Hypertension     takes Metoprolol and Ramipril daily  . Dizziness   . Gout     hx of--24yrs ago  . Bruises easily     pt is on Plavix and ASA  . Shortness of breath     wakes pt up during night with occ shortness of breathe;states it happens about once a month  . Impaired hearing   . Urinary frequency   . UTI (lower urinary tract infection)     hx of--03/2010  . Anemia     takes Glyburide bid  . CAD (coronary artery disease) of artery bypass graft   . Pneumonia     "at least twice that I know of"  . Seizures 05/14/11    "first time was today"  . CVA (cerebral  vascular accident) ~ 2009    right arm/leg occ hurts  . Carotid artery occlusion 2013    s/p bilateral endarterectomy  . Chronic headache   . Chronic neck pain   . Dementia with behavioral disturbance   . Dyspnea     chronic, daily    MEDICATIONS:  Prior to Admission medications   Medication Sig Start Date End Date Taking? Authorizing Provider  ACCU-CHEK AVIVA PLUS test strip TEST TWICE A DAY 03/16/13   Ileana Ladd, MD  aspirin EC 81 MG EC tablet Take 1 tablet (81 mg total) by mouth 2 (two) times daily. 03/21/12   Rollene Rotunda, MD  cephALEXin (KEFLEX) 500 MG capsule Take 1 capsule (500 mg total) by mouth 2 (two) times daily. 05/18/14   Frederica Kuster, MD  clopidogrel (PLAVIX) 75 MG tablet TAKE 1 TABLET DAILY 05/14/14   Rollene Rotunda, MD  colchicine 0.6 MG tablet Take 1 tablet (0.6 mg total) by mouth 2 (two) times daily. Patient not taking: Reported on 05/17/2014 05/07/14   Gwenyth Bender, NP  iron polysaccharides (NIFEREX) 150 MG capsule Take 1 capsule (150 mg total) by mouth 2 (two) times daily. 05/07/14   Gwenyth Bender, NP  LORazepam (ATIVAN) 0.5 MG tablet 1-2 tablets twice daily as needed 05/17/14   Bertram Millard  Hyacinth Meeker, MD  memantine (NAMENDA) 10 MG tablet Take 10 mg by mouth 2 (two) times daily.    Historical Provider, MD  metoprolol (LOPRESSOR) 50 MG tablet Take 25 mg by mouth every morning.     Historical Provider, MD  pantoprazole (PROTONIX) 40 MG tablet Take 1 tablet (40 mg total) by mouth daily. 05/07/14   Gwenyth Bender, NP  QUEtiapine (SEROQUEL) 50 MG tablet TAKE 1 TABLET BY MOUTH IN THE MORNING AND 2 TABLETS AT BEDTIME 05/24/14   Frederica Kuster, MD  simvastatin (ZOCOR) 20 MG tablet Take 1 tablet (20 mg total) by mouth daily at 6 PM. 05/22/14   Rollene Rotunda, MD    ALLERGIES:  Allergies  Allergen Reactions  . Fexofenadine Hcl Other (See Comments)    Heart attack symptoms  . Flonase [Fluticasone Propionate] Other (See Comments)    Heart attack symptoms  . Nasal Spray Other (See  Comments)    Heart attack symptoms   . Nasal Spray     CAN NOT TOLERATE ANY NASAL SPRAYS!!!!!  Causes heart attack symptoms    SOCIAL HISTORY:  History  Substance Use Topics  . Smoking status: Former Smoker -- 1.00 packs/day for 20 years    Types: Cigarettes    Quit date: 03/02/1974  . Smokeless tobacco: Never Used  . Alcohol Use: No     Comment: quit in 1981    FAMILY HISTORY: Family History  Problem Relation Age of Onset  . Diabetes Sister   . Cancer Brother   . Diabetes Brother   . Anesthesia problems Neg Hx   . Hypotension Neg Hx   . Malignant hyperthermia Neg Hx   . Pseudochol deficiency Neg Hx   . Hearing loss Father     EXAM:  Triage Vitals: BP 137/54 mmHg  Pulse 123  Temp(Src) 98.9 F (37.2 C) (Oral)  Resp 26  Ht 6' (1.829 m)  Wt 200 lb (90.719 kg)  BMI 27.12 kg/m2  SpO2 92%  CONSTITUTIONAL: Alert and oriented to person and will respond intermittently to questions most of history is obtained from family, in mild respiratory distress, speaking in short sentences HEAD: Normocephalic EYES: Conjunctivae clear, PERRL ENT: normal nose; no rhinorrhea; moist mucous membranes; pharynx without lesions noted NECK: Supple, no meningismus, no LAD  CARD: Regular and tachycardic, S1 and S2 appreciated; no murmurs, no clicks, no rubs, no gallops RESP: Normal chest excursion without splinting or tachypnea; Mild respiratory distress. Speaking in short sentences. Diminished aeration diffusely. Mild expiratory wheezes. no rhonchi, no rales, no hypoxia ABD/GI: Normal bowel sounds; non-distended; soft, non-tender, no rebound, no guarding BACK:  The back appears normal and is non-tender to palpation, there is no CVA tenderness EXT: Normal ROM in all joints; non-tender to palpation; no edema; normal capillary refill; no cyanosis    SKIN: Normal color for age and race; warm, no rash NEURO: Moves all extremities equally PSYCH: The patient's mood and manner are appropriate.  Grooming and personal hygiene are appropriate.  MEDICAL DECISION MAKING: Patient here with fever, tachycardia. He meets SIRS criteria. Will give broad-spectrum antibiotics obtain cultures, urine, chest x-ray. Anticipate admission. Also likely having a COPD exacerbation. We'll give albuterol, Solu-Medrol. Updated patient's son and daughter-in-law at bedside.  ED PROGRESS: Patient has left sided pneumonia. Lactate elevated at 3.3. He is receiving IV fluids. He has received vancomycin and Zosyn. Heart rate is improving and he is still normotensive. Discussed with Dr. Julian Reil with hospitalist service for admission to telemetry, inpatient bed. I will  place holding orders.   EKG Interpretation  Date/Time:  Friday May 25 2014 00:22:23 EDT Ventricular Rate:  122 PR Interval:  153 QRS Duration: 93 QT Interval:  319 QTC Calculation: 454 R Axis:   19 Text Interpretation:  Sinus tachycardia Low voltage, precordial leads Anteroseptal infarct, old No significant change since last tracing Reconfirmed by Darla Mcdonald,  DO, Dasha Kawabata ([id number]) on 05/25/2014 12:37:20 AM        CRITICAL CARE Performed by: Raelyn NumberWARD, Darrin Koman N   Total critical care time: 45 minutes  Critical care time was exclusive of separately billable procedures and treating other patients.  Critical care was necessary to treat or prevent imminent or life-threatening deterioration.  Critical care was time spent personally by me on the following activities: development of treatment plan with patient and/or surrogate as well as nursing, discussions with consultants, evaluation of patient's response to treatment, examination of patient, obtaining history from patient or surrogate, ordering and performing treatments and interventions, ordering and review of laboratory studies, ordering and review of radiographic studies, pulse oximetry and re-evaluation of patient's condition.    I personally performed the services described in this documentation,  which was scribed in my presence. The recorded information has been reviewed and is accurate.    [name] MawKristen N Lavaya Defreitas, DO 05/25/14 [phone]

## 2014-05-25 NOTE — Progress Notes (Addendum)
[facility] PROGRESS NOTE  [name] WJX:[id number] DOB: Dec 15, 1931 DOA: 05/25/2014 PCP: [name], [name], MD   Brief narrative 81 year old male with history of CAD (status post CABG) dementia, type 2 diabetes mellitus recent hospitalization for acute encephalopathy secondary to UTI was brought to the hospital by family for sudden onset of cough with shortness of breath on the day of admission with fever and encephalopathy. Patient had sepsis with fever,  tachycardia and elevated lactic acid secondary to healthcare associated pneumonia and admitted to telemetry.   Assessment/Plan: Severe sepsis Candidate to healthcare associated pneumonia. Patient diaphoretic with progressively elevated lactic acid this morning. Ordered 2 L  IV normal saline bolus and transferred patient to ICU. Continue empiric IV vancomycin and cefepime. Pro calcitonin level normal. Follow cultures. -Check CT scan of the abdomen and pelvis to rule out intra-abdominal infection or obstruction. - nothing by mouth for now except for medications. -Check flu PCR. Ordered empiric Tamiflu. -Follow-up repeat lactate.  CAD Asymptomatic. Continue aspirin , Plavix, metoprolol and statin.  Dementia with behavioral disturbance Continue Namenda  Type 2 diabetes mellitus Patient hyperglycemic. Monitor on sliding scale insulin. Added bedtime Lantus.  Code Status: full code Family Communication: Son called and updated on the phone. He says his father has a living will which he will search in the house and bring it when he comes to see him later today. Disposition Plan: Transfer to ICU   Consultants:  None  Procedures:  CT of the abdomen and pelvis  Antibiotics:  IV vancomycin and cefepime since 3/225--  HPI/Subjective: Patient seen and examined. Afebrile this morning but diaphoretic and progressively elevated lactic acid. Patient transferred to ICU, ordered IV fluid bolus and CT scan of the abdomen and  pelvis  Objective: Filed Vitals:   05/25/14 0433  BP: 109/48  Pulse: 101  Temp: 98 F (36.7 C)  Resp: 20    Intake/Output Summary (Last 24 hours) at 05/25/14 0759 Last data filed at 05/25/14 0221  Gross per 24 hour  Intake      0 ml  Output    225 ml  Net   -225 ml   Filed Weights   05/25/14 0014  Weight: 90.719 kg (200 lb)    Exam:   General:  Elderly male lying in bed lethargic and diaphoretic  HEENT: Pallor present, dry oral mucosa, supple neck  Cardiovascular: S1 and S2, no murmurs rub or gallop  Respiratory: Bibasilar breath sounds, no rhonchi or wheeze  GI: , Mildly distended, nontender, bowel sounds present  Musculoskeletal: On, no edema  CNS: Lethargic, disoriented  Data Reviewed: Basic Metabolic Panel:  Recent Labs Lab 05/25/14 0100 05/25/14 0447  NA 138 139  K 3.8 3.0*  CL 102 105  CO2 25 25  GLUCOSE 235* 318*  BUN 16 15  CREATININE 1.34 1.27  CALCIUM 8.2* 7.7*   Liver Function Tests:  Recent Labs Lab 05/25/14 0100  AST 20  ALT 15  ALKPHOS 49  BILITOT 0.4  PROT 6.8  ALBUMIN 3.3*   No results for input(s): LIPASE, AMYLASE in the last 168 hours. No results for input(s): AMMONIA in the last 168 hours. CBC:  Recent Labs Lab 05/25/14 0100 05/25/14 0447  WBC 6.6 7.0  NEUTROABS 4.9  --   HGB 11.9* 10.5*  HCT 36.8* 32.8*  MCV 98.9 98.5  PLT 209 193   Cardiac Enzymes:  Recent Labs Lab 05/25/14 0100  TROPONINI <0.03   BNP (last 3 results)  Recent Labs  04/10/14 2108  BNP 21.0    ProBNP (last 3 results) No results for input(s): PROBNP in the last 8760 hours.  CBG:  Recent Labs Lab 05/25/14 0512 05/25/14 0739  GLUCAP 316* 312*    No results found for this or any previous visit (from the past 240 hour(s)).   Studies: Dg Chest Port 1 View  05/25/2014   CLINICAL DATA:  Acute onset of shortness of breath. Nonproductive cough and fever. Initial encounter.  EXAM: PORTABLE CHEST - 1 VIEW  COMPARISON:  Chest  radiograph performed 05/06/2014  FINDINGS: The lungs are mildly hypoexpanded. Mild left basilar opacity may reflect atelectasis or mild pneumonia. There is no evidence of pleural effusion or pneumothorax.  The cardiomediastinal silhouette is borderline enlarged. The patient is status post median sternotomy. No acute osseous abnormalities are seen.  IMPRESSION: Lungs mildly hypoexpanded. Mild left basilar airspace opacity may reflect atelectasis or mild pneumonia. Borderline cardiomegaly.   Electronically Signed   By: Roanna Raider M.D.   On: 05/25/2014 01:09    Scheduled Meds: . aspirin EC  81 mg Oral BID  . [START ON 05/26/2014] ceFEPime (MAXIPIME) IV  1 g Intravenous Q24H  . clopidogrel  75 mg Oral Daily  . colchicine  0.6 mg Oral BID  . heparin  5,000 Units Subcutaneous 3 times per day  . insulin aspart  0-15 Units Subcutaneous TID WC  . insulin aspart  0-5 Units Subcutaneous QHS  . insulin glargine  12 Units Subcutaneous QHS  . iron polysaccharides  150 mg Oral BID  . memantine  10 mg Oral BID  . metoprolol  25 mg Oral q morning - 10a  . oseltamivir  75 mg Oral BID  . pantoprazole  40 mg Oral Daily  . potassium chloride  40 mEq Oral Q6H  . QUEtiapine  50 mg Oral BID  . QUEtiapine  50 mg Oral QHS  . simvastatin  20 mg Oral q1800  . vancomycin  750 mg Intravenous Q12H   Continuous Infusions: . sodium chloride 100 mL/hr at 05/25/14 0230  . sodium chloride 125 mL/hr at 05/25/14 0555  . albuterol 10 mg/hr (05/25/14 0045)      Time spent: 25 minutes    [name]  [facility] Pager [phone] If 7PM-7AM, please contact night-coverage at [email or web address], password [facility] 05/25/2014, 7:59 AM  LOS: 0 days

## 2014-05-26 DIAGNOSIS — E876 Hypokalemia: Secondary | ICD-10-CM | POA: Diagnosis present

## 2014-05-26 DIAGNOSIS — A419 Sepsis, unspecified organism: Secondary | ICD-10-CM | POA: Diagnosis present

## 2014-05-26 DIAGNOSIS — R7989 Other specified abnormal findings of blood chemistry: Secondary | ICD-10-CM | POA: Diagnosis present

## 2014-05-26 DIAGNOSIS — R6520 Severe sepsis without septic shock: Secondary | ICD-10-CM

## 2014-05-26 LAB — GLUCOSE, CAPILLARY
GLUCOSE-CAPILLARY: 95 mg/dL (ref 70–99)
Glucose-Capillary: 141 mg/dL — ABNORMAL HIGH (ref 70–99)
Glucose-Capillary: 146 mg/dL — ABNORMAL HIGH (ref 70–99)
Glucose-Capillary: 128 mg/dL — ABNORMAL HIGH (ref 70–99)

## 2014-05-26 LAB — URINE CULTURE
Colony Count: NO GROWTH
Culture: NO GROWTH

## 2014-05-26 LAB — HIV ANTIBODY (ROUTINE TESTING W REFLEX): HIV Screen 4th Generation wRfx: NONREACTIVE

## 2014-05-26 MED ORDER — QUETIAPINE FUMARATE 25 MG PO TABS
50.0000 mg | ORAL_TABLET | Freq: Two times a day (BID) | ORAL | Status: DC
  Administered 2014-05-26 – 2014-05-27 (×3): 50 mg via ORAL
  Filled 2014-05-26: qty 1
  Filled 2014-05-26: qty 2
  Filled 2014-05-26 (×4): qty 1
  Filled 2014-05-26: qty 2

## 2014-05-26 MED ORDER — LORAZEPAM 0.5 MG PO TABS
0.5000 mg | ORAL_TABLET | Freq: Four times a day (QID) | ORAL | Status: DC | PRN
  Administered 2014-05-26 – 2014-05-27 (×4): 0.5 mg via ORAL
  Filled 2014-05-26 (×4): qty 1

## 2014-05-26 MED ORDER — ALBUTEROL SULFATE (2.5 MG/3ML) 0.083% IN NEBU: 2.5000 mg | INHALATION_SOLUTION | Freq: Four times a day (QID) | RESPIRATORY_TRACT | Status: DC | PRN

## 2014-05-26 MED ORDER — FUROSEMIDE 10 MG/ML IJ SOLN
40.0000 mg | Freq: Once | INTRAMUSCULAR | Status: AC
  Administered 2014-05-26: 40 mg via INTRAVENOUS
  Filled 2014-05-26: qty 4

## 2014-05-26 MED ORDER — BENZONATATE 100 MG PO CAPS
100.0000 mg | ORAL_CAPSULE | Freq: Three times a day (TID) | ORAL | Status: DC | PRN
  Administered 2014-05-26: 100 mg via ORAL
  Filled 2014-05-26: qty 1

## 2014-05-26 NOTE — Progress Notes (Signed)
[facility] PROGRESS NOTE  [name] ZOX:[id number] DOB: 03-03-1931 DOA: 05/25/2014 PCP: Frederica Kuster, MD   Brief narrative 81 year old male with history of CAD (status post CABG) dementia, type 2 diabetes mellitus recent hospitalization for acute encephalopathy secondary to UTI was brought to the hospital by family for sudden onset of cough with shortness of breath on the day of admission with fever and encephalopathy. Patient had sepsis with fever,  tachycardia and elevated lactic acid secondary to healthcare associated pneumonia and admitted to telemetry. Patient continued to be diaphoretic and lethargic weith progressively elevated lactic acid. Was transferred to ICU on the day of admission for closer monitoring.    Assessment/Plan: Severe sepsis secondary to  healthcare associated pneumonia On 3/25 pt was diaphoretic with progressively elevated lactic acid and lethargic ( lactic acid 3.3>>4.4>>5.5>>1.6). ABG unremarkable. CT Abd and pelvis without any infection or obstruction. Pro calcitonin level normal. Cultures pending. Flu PCR negative. -improved with aggressive IV hydration and antibiotics. Mental status back to baseline.  Continue empiric IV vancomycin and cefepime. Follow cultures. sepsis now resolved. transfer to med surg. Patient wheezy after receiving aggressive IV hydration. D/c fluids. Lasix 40 mg IV x1. Add albuterol nebs prn  CAD Asymptomatic. Continue aspirin , Plavix, metoprolol and statin.  Dementia with behavioral disturbance Continue Namenda  Type 2 diabetes mellitus Patient hyperglycemic on admission . Monitor on sliding scale insulin. Added bedtime Lantus.  hypokalemia Replenished  Diet: diabetic  DVT prophylaxis: sq lovenox   Code Status: full code Family Communication: Son called and updated on the phone on 3/25 Disposition Plan: Transfer to med surg. Home in 1-2 days if continues to  improve.   Consultants:  None  Procedures:  CT of the abdomen and pelvis  Antibiotics:  IV vancomycin and cefepime since 3/225--  HPI/Subjective: Patient seen and examined. Afebrile since admission. Was transferred to ICU for persistent lethargy, diaphoresis and elevated lactate. By the late afternoon he was awake and well oriented.  Objective: Filed Vitals:   05/26/14 0700  BP: 148/67  Pulse: 79  Temp:   Resp: 21    Intake/Output Summary (Last 24 hours) at 05/26/14 0732 Last data filed at 05/25/14 2300  Gross per 24 hour  Intake 2525.42 ml  Output    800 ml  Net 1725.42 ml   Filed Weights   05/25/14 0014  Weight: 90.719 kg (200 lb)    Exam:   General:  Elderly male lying in bed in NAD  HEENT: Moist  oral mucosa, supple neck  Cardiovascular: S1 and S2, no murmurs rub or gallop  Respiratory: equal air entry b/l, scattered rhonchi b/l  GI: soft , non distended, nontender, bowel sounds present  Musculoskeletal: warm, no edema  CNS: alert and oriented  Data Reviewed: Basic Metabolic Panel:  Recent Labs Lab 05/25/14 0100 05/25/14 0447  NA 138 139  K 3.8 3.0*  CL 102 105  CO2 25 25  GLUCOSE 235* 318*  BUN 16 15  CREATININE 1.34 1.27  CALCIUM 8.2* 7.7*   Liver Function Tests:  Recent Labs Lab 05/25/14 0100  AST 20  ALT 15  ALKPHOS 49  BILITOT 0.4  PROT 6.8  ALBUMIN 3.3*   No results for input(s): LIPASE, AMYLASE in the last 168 hours. No results for input(s): AMMONIA in the last 168 hours. CBC:  Recent Labs Lab 05/25/14 0100 05/25/14 0447  WBC 6.6 7.0  NEUTROABS 4.9  --   HGB 11.9* 10.5*  HCT 36.8* 32.8*  MCV 98.9 98.5  PLT 209 193   Cardiac Enzymes:  Recent Labs Lab 05/25/14 0100  TROPONINI <0.03   BNP (last 3 results)  Recent Labs  04/10/14 2108  BNP 21.0    ProBNP (last 3 results) No results for input(s): PROBNP in the last 8760 hours.  CBG:  Recent Labs Lab 05/25/14 0512 05/25/14 0739 05/25/14 1205  05/25/14 1610 05/25/14 2156  GLUCAP 316* 312* 332* 197* 134*    Recent Results (from the past 240 hour(s))  MRSA PCR Screening     Status: None   Collection Time: 05/25/14  1:30 PM  Result Value Ref Range Status   MRSA by PCR NEGATIVE NEGATIVE Final     Studies: Ct Abdomen Pelvis W Contrast  05/25/2014   CLINICAL DATA:  History of Coronary artery disease status post CABG. Sepsis with fever.  EXAM: CT ABDOMEN AND PELVIS WITH CONTRAST  TECHNIQUE: Multidetector CT imaging of the abdomen and pelvis was performed using the standard protocol following bolus administration of intravenous contrast.  CONTRAST:  50mL OMNIPAQUE IOHEXOL 300 MG/ML SOLN, 100mL OMNIPAQUE IOHEXOL 300 MG/ML SOLN  COMPARISON:  None.  FINDINGS: Lower chest:  The lung bases are clear.  Hepatobiliary: There is no focal liver abnormality identified. The gallbladder appears normal. No biliary dilatation.  Pancreas: Negative  Spleen: Negative  Adrenals/Urinary Tract: Normal adrenal glands. Mild bilateral and symmetric perinephric fat stranding, nonspecific. Renal vascular calcifications are identified. No nephrolithiasis or obstructive uropathy identified. The urinary bladder appears within normal limits.  Stomach/Bowel: The stomach is within normal limits. The small bowel loops have a normal course and caliber. No obstruction. Normal appearance of the colon. There are multiple distal colonic diverticula identified without acute inflammation.  Vascular/Lymphatic: Calcified atherosclerotic disease involves the abdominal aorta. No aneurysm. No enlarged retroperitoneal or mesenteric adenopathy. No enlarged pelvic or inguinal lymph nodes.  Reproductive: Prostate gland enlargement noted. Symmetric appearance of the seminal vesicles.  Other: There is no ascites or focal fluid collections within the abdomen or pelvis.  Musculoskeletal: No acute bone abnormalities noted. There is mild lumbar degenerative disc disease.  IMPRESSION: 1. No acute  findings identified. 2. Atherosclerotic disease. 3. Prostate gland enlargement.   Electronically Signed   By: Signa Kellaylor  Stroud M.D.   On: 05/25/2014 16:16   Dg Chest Port 1 View  05/25/2014   CLINICAL DATA:  Acute onset of shortness of breath. Nonproductive cough and fever. Initial encounter.  EXAM: PORTABLE CHEST - 1 VIEW  COMPARISON:  Chest radiograph performed 05/06/2014  FINDINGS: The lungs are mildly hypoexpanded. Mild left basilar opacity may reflect atelectasis or mild pneumonia. There is no evidence of pleural effusion or pneumothorax.  The cardiomediastinal silhouette is borderline enlarged. The patient is status post median sternotomy. No acute osseous abnormalities are seen.  IMPRESSION: Lungs mildly hypoexpanded. Mild left basilar airspace opacity may reflect atelectasis or mild pneumonia. Borderline cardiomegaly.   Electronically Signed   By: Roanna RaiderJeffery  Chang M.D.   On: 05/25/2014 01:09    Scheduled Meds: . aspirin EC  81 mg Oral BID  . ceFEPime (MAXIPIME) IV  1 g Intravenous Q24H  . clopidogrel  75 mg Oral Daily  . furosemide  40 mg Intravenous Once  . heparin  5,000 Units Subcutaneous 3 times per day  . insulin aspart  0-15 Units Subcutaneous TID WC  . insulin aspart  0-5 Units Subcutaneous QHS  . insulin glargine  12 Units Subcutaneous QHS  . iron polysaccharides  150 mg Oral BID  . memantine  10 mg Oral  BID  . metoprolol  25 mg Oral q morning - 10a  . pantoprazole  40 mg Oral Daily  . QUEtiapine  100 mg Oral QHS  . QUEtiapine  50 mg Oral Daily  . QUEtiapine  50 mg Oral BID  . simvastatin  20 mg Oral q1800  . vancomycin  750 mg Intravenous Q12H   Continuous Infusions:      Time spent: 25 minutes    Eddie North  [facility] Pager [phone] If 7PM-7AM, please contact night-coverage at [email or web address], password [facility] 05/26/2014, 7:32 AM  LOS: 1 day

## 2014-05-27 DIAGNOSIS — E872 Acidosis: Secondary | ICD-10-CM

## 2014-05-27 DIAGNOSIS — G9340 Encephalopathy, unspecified: Secondary | ICD-10-CM

## 2014-05-27 DIAGNOSIS — R6520 Severe sepsis without septic shock: Secondary | ICD-10-CM

## 2014-05-27 DIAGNOSIS — I10 Essential (primary) hypertension: Secondary | ICD-10-CM

## 2014-05-27 LAB — CBC
HCT: 38.2 % — ABNORMAL LOW (ref 39.0–52.0)
HEMOGLOBIN: 12.5 g/dL — AB (ref 13.0–17.0)
MCH: 31.8 pg (ref 26.0–34.0)
MCHC: 32.7 g/dL (ref 30.0–36.0)
MCV: 97.2 fL (ref 78.0–100.0)
Platelets: 208 10*3/uL (ref 150–400)
RBC: 3.93 MIL/uL — ABNORMAL LOW (ref 4.22–5.81)
RDW: 14 % (ref 11.5–15.5)
WBC: 7.3 10*3/uL (ref 4.0–10.5)

## 2014-05-27 LAB — GLUCOSE, CAPILLARY
Glucose-Capillary: 90 mg/dL (ref 70–99)
Glucose-Capillary: 100 mg/dL — ABNORMAL HIGH (ref 70–99)
Glucose-Capillary: 138 mg/dL — ABNORMAL HIGH (ref 70–99)

## 2014-05-27 LAB — BASIC METABOLIC PANEL
ANION GAP: 9 (ref 5–15)
BUN: 18 mg/dL (ref 6–23)
CALCIUM: 8.5 mg/dL (ref 8.4–10.5)
CO2: 28 mmol/L (ref 19–32)
Chloride: 100 mmol/L (ref 96–112)
Creatinine, Ser: 1.15 mg/dL (ref 0.50–1.35)
GFR calc non Af Amer: 58 mL/min — ABNORMAL LOW (ref >90)
GFR, EST AFRICAN AMERICAN: 67 mL/min — AB (ref >90)
Glucose, Bld: 138 mg/dL — ABNORMAL HIGH (ref 70–99)
Potassium: 3.8 mmol/L (ref 3.5–5.1)
Sodium: 137 mmol/L (ref 135–145)

## 2014-05-27 MED ORDER — ALBUTEROL SULFATE (2.5 MG/3ML) 0.083% IN NEBU: 2.5000 mg | INHALATION_SOLUTION | Freq: Four times a day (QID) | RESPIRATORY_TRACT | Status: DC | PRN

## 2014-05-27 MED ORDER — METOPROLOL TARTRATE 50 MG PO TABS: 25.0000 mg | ORAL_TABLET | Freq: Two times a day (BID) | ORAL | Status: DC

## 2014-05-27 MED ORDER — FUROSEMIDE 20 MG PO TABS
20.0000 mg | ORAL_TABLET | Freq: Every day | ORAL | Status: DC | PRN
Start: 2014-05-27 — End: 2014-06-02

## 2014-05-27 MED ORDER — HYDRALAZINE HCL 20 MG/ML IJ SOLN: 10.0000 mg | INTRAMUSCULAR | Status: DC | PRN

## 2014-05-27 MED ORDER — INSULIN GLARGINE 100 UNIT/ML ~~LOC~~ SOLN: 12.0000 [IU] | Freq: Every day | SUBCUTANEOUS | Status: DC

## 2014-05-27 MED ORDER — LEVOFLOXACIN 750 MG PO TABS: 750.0000 mg | ORAL_TABLET | Freq: Every day | ORAL | Status: DC

## 2014-05-27 NOTE — Discharge Summary (Signed)
Physician Discharge Summary  [name] ZOX:[id number] DOB: May 13, 1931 DOA: 05/25/2014  PCP: Frederica Kuster, MD  Admit date: 05/25/2014 Discharge date: 05/27/2014  Time spent: 40 minutes  Recommendations for Outpatient Follow-up:  1. Follow up with primary care physician in 1-2 weeks  Discharge Diagnoses:  Principal Problem:   Severe sepsis Active Problems:   Diabetes mellitus   Essential hypertension   Encephalopathy acute   HCAP (healthcare-associated pneumonia)   Sepsis   Elevated lactic acid level   Hypokalemia   Discharge Condition: improved  Diet recommendation: low salt, low carb  Filed Weights   05/25/14 0014 05/26/14 1535  Weight: 90.719 kg (200 lb) 99.338 kg (219 lb)    History of present illness:  [name] is a 81 y.o. male who presents to the ED with c/o sudden onset SOB, cough, wet breath sounds. Symptoms onset 2 hours PTA, developed fever and initially dry cough which has now become wet sounding following fluid resuscitation in the ED. Patient felt fine a day prior to admission. Was recently admitted on March 6 for UTI with encephalopathy, and even had a stress test around that time as well. Patient has also been more confused per family.  Hospital Course:  Patient was admitted to the stepdown unit and was aggressively hydrated with IV fluids. He initially had a rising lactic acid from 3.3 of 5.1. Subsequent lactic acid was 1.6 after hydration. CT abdomen and pelvis was unremarkable. He was found to have an underlying pneumonia and was started on vancomycin and cefepime. His hemodynamics stabilized and his mental status improved back to baseline. He did develop some volume overload after aggressive hydration. IV fluids were discontinued and he was given a dose of Lasix. His respiratory status has overall improved. He is able to ambulate to the nurses station on room air and maintained oxygen saturations at 97%. He feels quite well and is  requesting discharge home. He does still have some evidence of lower extremity edema and therefore will place him on when necessary Lasix at home. He was started on Lantus for diabetes which will be continued on discharge. He has been transitioned to oral antibiotics to complete his course. He will continue with home health services.  Procedures:    Consultations:    Discharge Exam: Filed Vitals:   05/27/14 0557  BP: 163/68  Pulse: 108  Temp: 98.3 F (36.8 C)  Resp: 18    General: NAD Cardiovascular: S1, S2 RRR Respiratory: CTA B  Discharge Instructions   Discharge Instructions    Call MD for:  difficulty breathing, headache or visual disturbances    Complete by:  As directed      Call MD for:  temperature >100.4    Complete by:  As directed      Diet - low sodium heart healthy    Complete by:  As directed      Home Health    Complete by:  As directed   To provide the following care/treatments:   RN PT       Increase activity slowly    Complete by:  As directed           Current Discharge Medication List    START taking these medications   Details  albuterol (PROVENTIL) (2.5 MG/3ML) 0.083% nebulizer solution Take 3 mLs (2.5 mg total) by nebulization every 6 (six) hours as needed for wheezing or shortness of breath. Qty: 75 mL, Refills: 12    furosemide (LASIX) 20 MG  tablet Take 1 tablet (20 mg total) by mouth daily as needed for fluid or edema. Qty: 30 tablet, Refills: 0    insulin glargine (LANTUS) 100 UNIT/ML injection Inject 0.12 mLs (12 Units total) into the skin at bedtime. Qty: 10 mL, Refills: 11    levofloxacin (LEVAQUIN) 750 MG tablet Take 1 tablet (750 mg total) by mouth daily. Qty: 4 tablet, Refills: 0      CONTINUE these medications which have CHANGED   Details  metoprolol (LOPRESSOR) 50 MG tablet Take 0.5 tablets (25 mg total) by mouth 2 (two) times daily. Qty: 60 tablet, Refills: 1      CONTINUE these medications which have NOT CHANGED    Details  aspirin EC 81 MG EC tablet Take 1 tablet (81 mg total) by mouth 2 (two) times daily.    clopidogrel (PLAVIX) 75 MG tablet TAKE 1 TABLET DAILY Qty: 90 tablet, Refills: 2    iron polysaccharides (NIFEREX) 150 MG capsule Take 1 capsule (150 mg total) by mouth 2 (two) times daily. Qty: 60 capsule, Refills: 0    LORazepam (ATIVAN) 0.5 MG tablet 1-2 tablets twice daily as needed Qty: 120 tablet, Refills: 0    memantine (NAMENDA) 10 MG tablet Take 10 mg by mouth 2 (two) times daily.    pantoprazole (PROTONIX) 40 MG tablet Take 1 tablet (40 mg total) by mouth daily. Qty: 30 tablet, Refills: 0    QUEtiapine (SEROQUEL) 50 MG tablet TAKE 1 TABLET BY MOUTH IN THE MORNING AND 2 TABLETS AT BEDTIME Qty: 90 tablet, Refills: 2    simvastatin (ZOCOR) 20 MG tablet Take 1 tablet (20 mg total) by mouth daily at 6 PM. Qty: 90 tablet, Refills: 3       Allergies  Allergen Reactions  . Fexofenadine Hcl Other (See Comments)    Heart attack symptoms  . Flonase [Fluticasone Propionate] Other (See Comments)    Heart attack symptoms  . Nasal Spray Other (See Comments)    Heart attack symptoms   . Nasal Spray     CAN NOT TOLERATE ANY NASAL SPRAYS!!!!!  Causes heart attack symptoms   Follow-up Information    Follow up with [facility].   Contact information:   [address] [place] [address] [phone]        The results of significant diagnostics from this hospitalization (including imaging, microbiology, ancillary and laboratory) are listed below for reference.    Significant Diagnostic Studies: Ct Head Wo Contrast  05/06/2014   CLINICAL DATA:  Altered mental status  EXAM: CT HEAD WITHOUT CONTRAST  TECHNIQUE: Contiguous axial images were obtained from the base of the skull through the vertex without intravenous contrast.  COMPARISON:  CT 05/14/2011  FINDINGS: Generalized atrophy without hydrocephalus. Mild progression of atrophy.  Chronic microvascular  ischemic change in the white matter. Chronic infarct in the right posterior basal ganglia is unchanged. Small chronic infarct left cerebellum.  Negative for acute infarct.  Negative for hemorrhage or mass.  Calvarium intact.  Atherosclerotic calcification.  IMPRESSION: Atrophy and chronic microvascular ischemia.  No acute abnormality.   Electronically Signed   By: Marlan Palau M.D.   On: 05/06/2014 20:30   Ct Abdomen Pelvis W Contrast  05/25/2014   CLINICAL DATA:  History of Coronary artery disease status post CABG. Sepsis with fever.  EXAM: CT ABDOMEN AND PELVIS WITH CONTRAST  TECHNIQUE: Multidetector CT imaging of the abdomen and pelvis was performed using the standard protocol following bolus administration of intravenous contrast.  CONTRAST:  50mL OMNIPAQUE IOHEXOL 300 MG/ML SOLN, 100mL OMNIPAQUE IOHEXOL 300 MG/ML SOLN  COMPARISON:  None.  FINDINGS: Lower chest:  The lung bases are clear.  Hepatobiliary: There is no focal liver abnormality identified. The gallbladder appears normal. No biliary dilatation.  Pancreas: Negative  Spleen: Negative  Adrenals/Urinary Tract: Normal adrenal glands. Mild bilateral and symmetric perinephric fat stranding, nonspecific. Renal vascular calcifications are identified. No nephrolithiasis or obstructive uropathy identified. The urinary bladder appears within normal limits.  Stomach/Bowel: The stomach is within normal limits. The small bowel loops have a normal course and caliber. No obstruction. Normal appearance of the colon. There are multiple distal colonic diverticula identified without acute inflammation.  Vascular/Lymphatic: Calcified atherosclerotic disease involves the abdominal aorta. No aneurysm. No enlarged retroperitoneal or mesenteric adenopathy. No enlarged pelvic or inguinal lymph nodes.  Reproductive: Prostate gland enlargement noted. Symmetric appearance of the seminal vesicles.  Other: There is no ascites or focal fluid collections within the abdomen or  pelvis.  Musculoskeletal: No acute bone abnormalities noted. There is mild lumbar degenerative disc disease.  IMPRESSION: 1. No acute findings identified. 2. Atherosclerotic disease. 3. Prostate gland enlargement.   Electronically Signed   By: Signa Kellaylor  Stroud M.D.   On: 05/25/2014 16:16   Dg Chest Port 1 View  05/25/2014   CLINICAL DATA:  Acute onset of shortness of breath. Nonproductive cough and fever. Initial encounter.  EXAM: PORTABLE CHEST - 1 VIEW  COMPARISON:  Chest radiograph performed 05/06/2014  FINDINGS: The lungs are mildly hypoexpanded. Mild left basilar opacity may reflect atelectasis or mild pneumonia. There is no evidence of pleural effusion or pneumothorax.  The cardiomediastinal silhouette is borderline enlarged. The patient is status post median sternotomy. No acute osseous abnormalities are seen.  IMPRESSION: Lungs mildly hypoexpanded. Mild left basilar airspace opacity may reflect atelectasis or mild pneumonia. Borderline cardiomegaly.   Electronically Signed   By: Roanna RaiderJeffery  Chang M.D.   On: 05/25/2014 01:09   Dg Chest Portable 1 View  05/06/2014   CLINICAL DATA:  Dry hacking cough for 1 week. Frequent falls. Diabetes. Altered mental status.  EXAM: PORTABLE CHEST - 1 VIEW  COMPARISON:  05/06/2014  FINDINGS: Prior CABG. Low lung volumes. Given the low lung volumes and AP projection, heart size is considered within normal limits.  Airway thickening is present, suggesting bronchitis or reactive airways disease.  IMPRESSION: 1. Airway thickening is present, suggesting bronchitis or reactive airways disease. 2. Prior CABG.   Electronically Signed   By: Gaylyn RongWalter  Liebkemann M.D.   On: 05/06/2014 19:10    Microbiology: Recent Results (from the past 240 hour(s))  Urine culture     Status: None   Collection Time: 05/25/14  2:20 AM  Result Value Ref Range Status   Specimen Description URINE, CATHETERIZED  Final   Special Requests NONE  Final   Colony Count NO GROWTH [facility]formed at [facility]   Final   Culture NO GROWTH Performed at [facility]   Final   Report Status 05/26/2014 FINAL  Final  Culture, blood (routine x 2) Call MD if unable to obtain prior to antibiotics being given     Status: None (Preliminary result)   Collection Time: 05/25/14  4:47 AM  Result Value Ref Range Status   Specimen Description BLOOD LEFT HAND  Final   Special Requests BOTTLES DRAWN AEROBIC AND ANAEROBIC 6CC  Final   Culture NO GROWTH 1 DAY  Final   Report Status PENDING  Incomplete  Culture, blood (routine  x 2) Call MD if unable to obtain prior to antibiotics being given     Status: None (Preliminary result)   Collection Time: 05/25/14  4:59 AM  Result Value Ref Range Status   Specimen Description BLOOD RIGHT HAND  Final   Special Requests BOTTLES DRAWN AEROBIC AND ANAEROBIC 6CC  Final   Culture NO GROWTH 1 DAY  Final   Report Status PENDING  Incomplete  MRSA PCR Screening     Status: None   Collection Time: 05/25/14  1:30 PM  Result Value Ref Range Status   MRSA by PCR NEGATIVE NEGATIVE Final     Labs: Basic Metabolic Panel:  Recent Labs Lab 05/25/14 0100 05/25/14 0447 05/27/14 1126  NA 138 139 137  K 3.8 3.0* 3.8  CL 102 105 100  CO2 25 25 28   GLUCOSE 235* 318* 138*  BUN 16 15 18   CREATININE 1.34 1.27 1.15  CALCIUM 8.2* 7.7* 8.5   Liver Function Tests:  Recent Labs Lab 05/25/14 0100  AST 20  ALT 15  ALKPHOS 49  BILITOT 0.4  PROT 6.8  ALBUMIN 3.3*   No results for input(s): LIPASE, AMYLASE in the last 168 hours. No results for input(s): AMMONIA in the last 168 hours. CBC:  Recent Labs Lab 05/25/14 0100 05/25/14 0447 05/27/14 1126  WBC 6.6 7.0 7.3  NEUTROABS 4.9  --   --   HGB 11.9* 10.5* 12.5*  HCT 36.8* 32.8* 38.2*  MCV 98.9 98.5 97.2  PLT 209 193 208   Cardiac Enzymes:  Recent Labs Lab 05/25/14 0100  TROPONINI <0.03   BNP: BNP (last 3 results)  Recent Labs  04/10/14 2108  BNP 21.0    ProBNP (last 3 results) No  results for input(s): PROBNP in the last 8760 hours.  CBG:  Recent Labs Lab 05/26/14 1608 05/26/14 2012 05/27/14 0125 05/27/14 0823 05/27/14 1147  GLUCAP 128* 95 90 100* 138*       Signed:  MEMON,JEHANZEB  [facility] 05/27/2014, 3:03 PM

## 2014-05-27 NOTE — Progress Notes (Signed)
Patient discharged home with family.  IVs removed - WNL.  Reviewed medications with family.  Daughter in law states she has given insulin previously and is comfortable giving lantus to the patient at bed time.  HH orders faxed to resume with [facility].  No questions at this time.  Patient stable to DC home. Assisted off unit via WC with RN.

## 2014-05-27 NOTE — Progress Notes (Signed)
Patient ambulated to nurses desk with rolling walker and assist from RN.  Tolerated well, O2 sat on RA 97%.  Patient with no complaints.

## 2014-05-28 ENCOUNTER — Emergency Department (HOSPITAL_COMMUNITY): Payer: Medicare Other

## 2014-05-28 ENCOUNTER — Encounter (HOSPITAL_COMMUNITY): Payer: Self-pay | Admitting: *Deleted

## 2014-05-28 ENCOUNTER — Inpatient Hospital Stay (HOSPITAL_COMMUNITY)
Admission: EM | Admit: 2014-05-28 | Discharge: 2014-06-02 | DRG: 871 | Disposition: A | Discharge status: Home Health | Payer: Medicare Other | Attending: Family Medicine | Admitting: Family Medicine

## 2014-05-28 DIAGNOSIS — G9340 Encephalopathy, unspecified: Secondary | ICD-10-CM | POA: Diagnosis present

## 2014-05-28 DIAGNOSIS — F0391 Unspecified dementia with behavioral disturbance: Secondary | ICD-10-CM | POA: Diagnosis present

## 2014-05-28 DIAGNOSIS — Z8673 Personal history of transient ischemic attack (TIA), and cerebral infarction without residual deficits: Secondary | ICD-10-CM

## 2014-05-28 DIAGNOSIS — E785 Hyperlipidemia, unspecified: Secondary | ICD-10-CM | POA: Diagnosis present

## 2014-05-28 DIAGNOSIS — R319 Hematuria, unspecified: Secondary | ICD-10-CM

## 2014-05-28 DIAGNOSIS — J9601 Acute respiratory failure with hypoxia: Secondary | ICD-10-CM | POA: Diagnosis present

## 2014-05-28 DIAGNOSIS — J449 Chronic obstructive pulmonary disease, unspecified: Secondary | ICD-10-CM | POA: Diagnosis present

## 2014-05-28 DIAGNOSIS — E119 Type 2 diabetes mellitus without complications: Secondary | ICD-10-CM | POA: Diagnosis present

## 2014-05-28 DIAGNOSIS — Z794 Long term (current) use of insulin: Secondary | ICD-10-CM

## 2014-05-28 DIAGNOSIS — A419 Sepsis, unspecified organism: Principal | ICD-10-CM | POA: Diagnosis present

## 2014-05-28 DIAGNOSIS — I2510 Atherosclerotic heart disease of native coronary artery without angina pectoris: Secondary | ICD-10-CM | POA: Diagnosis present

## 2014-05-28 DIAGNOSIS — J189 Pneumonia, unspecified organism: Secondary | ICD-10-CM | POA: Diagnosis present

## 2014-05-28 DIAGNOSIS — R6520 Severe sepsis without septic shock: Secondary | ICD-10-CM | POA: Diagnosis present

## 2014-05-28 DIAGNOSIS — I10 Essential (primary) hypertension: Secondary | ICD-10-CM | POA: Diagnosis present

## 2014-05-28 DIAGNOSIS — R338 Other retention of urine: Secondary | ICD-10-CM | POA: Diagnosis present

## 2014-05-28 DIAGNOSIS — D649 Anemia, unspecified: Secondary | ICD-10-CM | POA: Diagnosis present

## 2014-05-28 DIAGNOSIS — Y95 Nosocomial condition: Secondary | ICD-10-CM | POA: Diagnosis present

## 2014-05-28 DIAGNOSIS — Z7982 Long term (current) use of aspirin: Secondary | ICD-10-CM

## 2014-05-28 DIAGNOSIS — I739 Peripheral vascular disease, unspecified: Secondary | ICD-10-CM | POA: Diagnosis present

## 2014-05-28 DIAGNOSIS — R509 Fever, unspecified: Secondary | ICD-10-CM | POA: Diagnosis not present

## 2014-05-28 DIAGNOSIS — E876 Hypokalemia: Secondary | ICD-10-CM | POA: Diagnosis present

## 2014-05-28 DIAGNOSIS — K219 Gastro-esophageal reflux disease without esophagitis: Secondary | ICD-10-CM | POA: Diagnosis present

## 2014-05-28 DIAGNOSIS — M1990 Unspecified osteoarthritis, unspecified site: Secondary | ICD-10-CM | POA: Diagnosis present

## 2014-05-28 DIAGNOSIS — N390 Urinary tract infection, site not specified: Secondary | ICD-10-CM | POA: Diagnosis present

## 2014-05-28 DIAGNOSIS — I25810 Atherosclerosis of coronary artery bypass graft(s) without angina pectoris: Secondary | ICD-10-CM | POA: Diagnosis present

## 2014-05-28 DIAGNOSIS — Z87891 Personal history of nicotine dependence: Secondary | ICD-10-CM

## 2014-05-28 DIAGNOSIS — Z833 Family history of diabetes mellitus: Secondary | ICD-10-CM

## 2014-05-28 DIAGNOSIS — R310 Gross hematuria: Secondary | ICD-10-CM | POA: Diagnosis present

## 2014-05-28 DIAGNOSIS — N401 Enlarged prostate with lower urinary tract symptoms: Secondary | ICD-10-CM | POA: Diagnosis present

## 2014-05-28 LAB — URINE MICROSCOPIC-ADD ON

## 2014-05-28 LAB — CBC WITH DIFFERENTIAL/PLATELET
BASOS PCT: 0 % (ref 0–1)
Basophils Absolute: 0 10*3/uL (ref 0.0–0.1)
EOS ABS: 0 10*3/uL (ref 0.0–0.7)
EOS PCT: 1 % (ref 0–5)
HCT: 31.6 % — ABNORMAL LOW (ref 39.0–52.0)
HEMOGLOBIN: 10.5 g/dL — AB (ref 13.0–17.0)
Lymphocytes Relative: 7 % — ABNORMAL LOW (ref 12–46)
Lymphs Abs: 0.6 10*3/uL — ABNORMAL LOW (ref 0.7–4.0)
MCH: 32.3 pg (ref 26.0–34.0)
MCHC: 33.2 g/dL (ref 30.0–36.0)
MCV: 97.2 fL (ref 78.0–100.0)
MONO ABS: 0.5 10*3/uL (ref 0.1–1.0)
MONOS PCT: 6 % (ref 3–12)
NEUTROS PCT: 86 % — AB (ref 43–77)
Neutro Abs: 7.4 10*3/uL (ref 1.7–7.7)
Platelets: 159 10*3/uL (ref 150–400)
RBC: 3.25 MIL/uL — AB (ref 4.22–5.81)
RDW: 14.1 % (ref 11.5–15.5)
WBC: 8.6 10*3/uL (ref 4.0–10.5)

## 2014-05-28 LAB — URINALYSIS, ROUTINE W REFLEX MICROSCOPIC
Bilirubin Urine: NEGATIVE
Glucose, UA: NEGATIVE mg/dL
Ketones, ur: NEGATIVE mg/dL
LEUKOCYTES UA: NEGATIVE
NITRITE: NEGATIVE
Protein, ur: NEGATIVE mg/dL
SPECIFIC GRAVITY, URINE: 1.025 (ref 1.005–1.030)
Urobilinogen, UA: 0.2 mg/dL (ref 0.0–1.0)
pH: 6 (ref 5.0–8.0)

## 2014-05-28 LAB — LEGIONELLA ANTIGEN, URINE

## 2014-05-28 LAB — LACTIC ACID, PLASMA: LACTIC ACID, VENOUS: 2.7 mmol/L — AB (ref 0.5–2.0)

## 2014-05-28 MED ORDER — SODIUM CHLORIDE 0.9 % IV SOLN
1000.0000 mL | Freq: Once | INTRAVENOUS | Status: AC
  Administered 2014-05-28: 1000 mL via INTRAVENOUS

## 2014-05-28 MED ORDER — SODIUM CHLORIDE 0.9 % IV SOLN
1000.0000 mL | INTRAVENOUS | Status: DC
  Administered 2014-05-28: 1000 mL via INTRAVENOUS

## 2014-05-28 MED ORDER — ALBUTEROL SULFATE (2.5 MG/3ML) 0.083% IN NEBU
2.5000 mg | INHALATION_SOLUTION | Freq: Once | RESPIRATORY_TRACT | Status: AC
  Administered 2014-05-28: 2.5 mg via RESPIRATORY_TRACT
  Filled 2014-05-28: qty 3

## 2014-05-28 NOTE — ED Provider Notes (Deleted)
81 year old gentleman recently discharged hospital with pneumonia developed chills this afternoon. He was dyspneic. He was brought in by [facility] received albuterol nebulizer treatments and methylprednisolone. On exam, he is febrile and lungs have diffuse wheezes with some patchy areas of rales in the upper lobes. Heart is tachycardic. He has 1+ pitting edema. Septic workup will need to be reinitiated and he will likely need to be readmitted as a failure of outpatient treatment of HCAP.   Lactic acid has come back slightly elevated and early goal-directed fluid therapy has been initiated.  CRITICAL CARE Performed by: Dione BoozeGLICK,Freeman Borba Total critical care time: 35 minutes Critical care time was exclusive of separately billable procedures and treating other patients. Critical care was necessary to treat or prevent imminent or life-threatening deterioration. Critical care was time spent personally by me on the following activities: development of treatment plan with patient and/or surrogate as well as nursing, discussions with consultants, evaluation of patient's response to treatment, examination of patient, obtaining history from patient or surrogate, ordering and performing treatments and interventions, ordering and review of laboratory studies, ordering and review of radiographic studies, pulse oximetry and re-evaluation of patient's condition.   Medical screening examination/treatment/procedure(s) were performed by non-physician practitioner and as supervising physician I was immediately available for consultation/collaboration.   Dione Boozeavid Mileena Rothenberger, MD 05/29/14 [phone]

## 2014-05-28 NOTE — ED Notes (Signed)
Pt brought in by [facility] for c/o tremors; when they arrived on the scene, pt was found to be sob with heartrate in the 140's; pt's CBG 129; pt was given 3 duonebs and 125mg  of solumedrol IV en route to hospital; pt is c/o sob

## 2014-05-28 NOTE — ED Provider Notes (Signed)
CSN: [id number]     Arrival date & time 05/28/14  2218 History   First MD Initiated Contact with Patient 05/28/14 2244     Chief Complaint  Patient presents with  . Shortness of Breath     (Consider location/radiation/quality/duration/timing/severity/associated sxs/prior Treatment) The history is provided by the patient and medical records.   [name] is a 81 y.o. male presenting for evaluation of tremor and increased SOB today.  He was released from inpatient status here yesterday for treatment of HCAP pneumonia and severe sepsis (was admitted on 3/6 also for uti/ AMS).  He was treated with vanc, zosyn and aggressive IV fluids during his admission this week, discharged home with Levaquin and Lasix due to fluid overload from rehydration protocol.  He has a history of insulin dependent diabetes, HTN, CAD status post CABG, dementia and non oxygen dependent COPD.  He states his cbg was 90 today, so was given a piece of chocolate to bring up his blood glucose level.  EMS was called who found him to be tachycardic and sob so was given 3 duonebs prior to arrival along with solumedrol IV, with patient now stating he feels improved.  Pt reports subjective fevers with diaphoresis, he denies headache, nausea, vomiting, chest or abdominal pain.  He states he lives at home with his son who called ems.  Pt has a history of dementia.   Level 5 caveat:  Altered mental status.   Past Medical History  Diagnosis Date  . Diabetes mellitus type 2   . GERD (gastroesophageal reflux disease)   . Hyperlipidemia     takes Niacin nightly  . DJD (degenerative joint disease)   . Hypertension     takes Metoprolol and Ramipril daily  . Dizziness   . Gout     hx of--58yrs ago  . Bruises easily     pt is on Plavix and ASA  . Shortness of breath     wakes pt up during night with occ shortness of breathe;states it happens about once a month  . Impaired hearing   . Urinary frequency   . UTI (lower urinary  tract infection)     hx of--03/2010  . Anemia     takes Glyburide bid  . CAD (coronary artery disease) of artery bypass graft   . Pneumonia     "at least twice that I know of"  . Seizures 05/14/11    "first time was today"  . CVA (cerebral vascular accident) ~ 2009    right arm/leg occ hurts  . Carotid artery occlusion 2013    s/p bilateral endarterectomy  . Chronic headache   . Chronic neck pain   . Dementia with behavioral disturbance   . Dyspnea     chronic, daily   Past Surgical History  Procedure Laterality Date  . Appendectomy  1968  . Colonoscopy    . Endarterectomy  04/09/2011    Procedure: ENDARTERECTOMY CAROTID;  Surgeon: Chuck Hint, MD;  Location: [facility] OR;  Service: Vascular;  Laterality: Left;  Left Carotid endarterectomy With Dacron Patch Angioplasty  . Cardiac catheterization  1998    Dr. Antoine Poche  . Endarterectomy  05/08/2011    Procedure: ENDARTERECTOMY CAROTID;  Surgeon: Chuck Hint, MD;  Location: [facility] OR;  Service: Vascular;  Laterality: Right;  with [place] Patch Angioplasty  . Cataract extraction w/ intraocular lens  implant, bilateral  2012  . Coronary artery bypass graft  1998    LIMA to LAD, SVG  to diagonal, SVG to PDA, SVG to posterior lateral.   . Eye surgery  1953    right ; "piece of metal removed"  . Eye surgery  2012    Cataract- Bilateral   Family History  Problem Relation Age of Onset  . Diabetes Sister   . Cancer Brother   . Diabetes Brother   . Anesthesia problems Neg Hx   . Hypotension Neg Hx   . Malignant hyperthermia Neg Hx   . Pseudochol deficiency Neg Hx   . Hearing loss Father    History  Substance Use Topics  . Smoking status: Former Smoker -- 1.00 packs/day for 20 years    Types: Cigarettes    Quit date: 03/02/1974  . Smokeless tobacco: Never Used  . Alcohol Use: No     Comment: quit in 1981    Review of Systems  Constitutional: Positive for fever and chills.  HENT: Negative for congestion and sore  throat.   Eyes: Negative.   Respiratory: Positive for cough and shortness of breath. Negative for chest tightness.   Cardiovascular: Positive for palpitations. Negative for chest pain.  Gastrointestinal: Negative for nausea and abdominal pain.  Genitourinary: Negative.   Musculoskeletal: Negative for joint swelling, arthralgias and neck pain.  Skin: Negative.  Negative for rash and wound.  Neurological: Positive for tremors. Negative for dizziness, weakness, light-headedness, numbness and headaches.  Psychiatric/Behavioral: Negative.       Allergies  Fexofenadine hcl; Flonase; Nasal spray; and Nasal spray  Home Medications   Prior to Admission medications   Medication Sig Start Date End Date Taking? Authorizing Provider  albuterol (PROVENTIL) (2.5 MG/3ML) 0.083% nebulizer solution Take 3 mLs (2.5 mg total) by nebulization every 6 (six) hours as needed for wheezing or shortness of breath. 05/27/14   Erick BlinksJehanzeb Memon, MD  aspirin EC 81 MG EC tablet Take 1 tablet (81 mg total) by mouth 2 (two) times daily. 03/21/12   Rollene RotundaJames Hochrein, MD  clopidogrel (PLAVIX) 75 MG tablet TAKE 1 TABLET DAILY 05/14/14   Rollene RotundaJames Hochrein, MD  furosemide (LASIX) 20 MG tablet Take 1 tablet (20 mg total) by mouth daily as needed for fluid or edema. 05/27/14   Erick BlinksJehanzeb Memon, MD  insulin glargine (LANTUS) 100 UNIT/ML injection Inject 0.12 mLs (12 Units total) into the skin at bedtime. 05/27/14   Erick BlinksJehanzeb Memon, MD  iron polysaccharides (NIFEREX) 150 MG capsule Take 1 capsule (150 mg total) by mouth 2 (two) times daily. 05/07/14   Gwenyth BenderKaren M Black, NP  levofloxacin (LEVAQUIN) 750 MG tablet Take 1 tablet (750 mg total) by mouth daily. 05/27/14   Erick BlinksJehanzeb Memon, MD  LORazepam (ATIVAN) 0.5 MG tablet 1-2 tablets twice daily as needed Patient taking differently: Take 0.5-1 mg by mouth 2 (two) times daily as needed for anxiety.  05/17/14   Frederica KusterStephen M Miller, MD  memantine (NAMENDA) 10 MG tablet Take 10 mg by mouth 2 (two) times daily.     Historical Provider, MD  metoprolol (LOPRESSOR) 50 MG tablet Take 0.5 tablets (25 mg total) by mouth 2 (two) times daily. 05/27/14   Erick BlinksJehanzeb Memon, MD  pantoprazole (PROTONIX) 40 MG tablet Take 1 tablet (40 mg total) by mouth daily. 05/07/14   Gwenyth BenderKaren M Black, NP  QUEtiapine (SEROQUEL) 50 MG tablet TAKE 1 TABLET BY MOUTH IN THE MORNING AND 2 TABLETS AT BEDTIME 05/24/14   Frederica KusterStephen M Miller, MD  simvastatin (ZOCOR) 20 MG tablet Take 1 tablet (20 mg total) by mouth daily at 6 PM. 05/22/14  Rollene Rotunda, MD   BP 169/69 mmHg  Pulse 108  Temp(Src) 100.8 F (38.2 C) (Rectal)  Resp 21  Ht 6' (1.829 m)  Wt 220 lb (99.791 kg)  BMI 29.83 kg/m2  SpO2 94% Physical Exam  Constitutional: He appears well-developed and well-nourished.  HENT:  Head: Normocephalic and atraumatic.  Eyes: Conjunctivae are normal.  Neck: Normal range of motion.  Cardiovascular: Normal rate, regular rhythm, normal heart sounds and intact distal pulses.   Pulmonary/Chest: Effort normal. He has wheezes. He has no rhonchi. He has rales. He exhibits no tenderness.  Sparse expiratory wheeze bilateral, breath sounds reduced left.  Rales appreciated.  Frequent dry cough, speaking in full sentences.  Abdominal: Soft. Bowel sounds are normal. There is no tenderness.  Musculoskeletal: Normal range of motion. He exhibits edema.  Bilateral 1+ ankle edema.  Neurological: He is alert.  Skin: Skin is warm and dry.  Psychiatric: He has a normal mood and affect.  Nursing note and vitals reviewed.   ED Course  Procedures (including critical care time) Labs Review Labs Reviewed  CBC WITH DIFFERENTIAL/PLATELET - Abnormal; Notable for the following:    RBC 3.25 (*)    Hemoglobin 10.5 (*)    HCT 31.6 (*)    Neutrophils Relative % 86 (*)    Lymphocytes Relative 7 (*)    Lymphs Abs 0.6 (*)    All other components within normal limits  BASIC METABOLIC PANEL - Abnormal; Notable for the following:    Potassium 3.0 (*)    Glucose, Bld 140  (*)    Calcium 7.5 (*)    GFR calc non Af Amer 51 (*)    GFR calc Af Amer 59 (*)    All other components within normal limits  LACTIC ACID, PLASMA - Abnormal; Notable for the following:    Lactic Acid, Venous 2.7 (*)    All other components within normal limits  URINALYSIS, ROUTINE W REFLEX MICROSCOPIC - Abnormal; Notable for the following:    Hgb urine dipstick SMALL (*)    All other components within normal limits  URINE MICROSCOPIC-ADD ON - Abnormal; Notable for the following:    Squamous Epithelial / LPF FEW (*)    All other components within normal limits  CULTURE, BLOOD (ROUTINE X 2)  CULTURE, BLOOD (ROUTINE X 2)  URINE CULTURE  TROPONIN I  BRAIN NATRIURETIC PEPTIDE  LACTIC ACID, PLASMA    Imaging Review Dg Chest 2 View  05/29/2014   CLINICAL DATA:  Shortness of breath cough for 5 months.  EXAM: CHEST  2 VIEW  COMPARISON:  Portable AP view 05/25/2014  FINDINGS: Patient is post median sternotomy with multiple mediastinal clips and intact sternal wires. Improved aeration at the left lung base. Pulmonary vasculature is normal. No consolidation, pleural effusion, or pneumothorax. No acute osseous abnormalities are seen.  IMPRESSION: No acute pulmonary process. Improved aeration at the left lung base.   Electronically Signed   By: Rubye Oaks M.D.   On: 05/29/2014 00:21     EKG Interpretation None      MDM   Final diagnoses:  HCAP (healthcare-associated pneumonia)  Hypokalemia  Sepsis, due to unspecified organism    Pt with recent hospitalization for CAP and sepsis, returning with worsened sx including SOB, fever, chills, with new rise in lactic acid level despite return to baseline during hospitalization.  Pt will need to be admitted for ongoing abx tx and supportive care.    He was seen by Dr. Preston Fleeting during this  ed visit.  abx started including maxipime and vanc, potassium replacement started.    Burgess Amor, PA-C 05/29/14 0138  Dione Booze, MD 05/29/14 [phone]

## 2014-05-29 ENCOUNTER — Telehealth: Payer: Self-pay | Admitting: Family Medicine

## 2014-05-29 ENCOUNTER — Inpatient Hospital Stay (HOSPITAL_COMMUNITY): Payer: Medicare Other

## 2014-05-29 ENCOUNTER — Encounter (HOSPITAL_COMMUNITY): Payer: Self-pay | Admitting: *Deleted

## 2014-05-29 DIAGNOSIS — M1990 Unspecified osteoarthritis, unspecified site: Secondary | ICD-10-CM | POA: Diagnosis present

## 2014-05-29 DIAGNOSIS — Z8673 Personal history of transient ischemic attack (TIA), and cerebral infarction without residual deficits: Secondary | ICD-10-CM | POA: Diagnosis not present

## 2014-05-29 DIAGNOSIS — J189 Pneumonia, unspecified organism: Secondary | ICD-10-CM | POA: Diagnosis present

## 2014-05-29 DIAGNOSIS — Z87891 Personal history of nicotine dependence: Secondary | ICD-10-CM | POA: Diagnosis not present

## 2014-05-29 DIAGNOSIS — A419 Sepsis, unspecified organism: Secondary | ICD-10-CM | POA: Diagnosis present

## 2014-05-29 DIAGNOSIS — I739 Peripheral vascular disease, unspecified: Secondary | ICD-10-CM | POA: Diagnosis present

## 2014-05-29 DIAGNOSIS — N390 Urinary tract infection, site not specified: Secondary | ICD-10-CM | POA: Diagnosis present

## 2014-05-29 DIAGNOSIS — R319 Hematuria, unspecified: Secondary | ICD-10-CM

## 2014-05-29 DIAGNOSIS — J449 Chronic obstructive pulmonary disease, unspecified: Secondary | ICD-10-CM | POA: Diagnosis present

## 2014-05-29 DIAGNOSIS — K219 Gastro-esophageal reflux disease without esophagitis: Secondary | ICD-10-CM | POA: Diagnosis present

## 2014-05-29 DIAGNOSIS — I25810 Atherosclerosis of coronary artery bypass graft(s) without angina pectoris: Secondary | ICD-10-CM | POA: Diagnosis present

## 2014-05-29 DIAGNOSIS — R2689 Other abnormalities of gait and mobility: Secondary | ICD-10-CM | POA: Diagnosis not present

## 2014-05-29 DIAGNOSIS — A499 Bacterial infection, unspecified: Secondary | ICD-10-CM | POA: Diagnosis not present

## 2014-05-29 DIAGNOSIS — I2510 Atherosclerotic heart disease of native coronary artery without angina pectoris: Secondary | ICD-10-CM | POA: Diagnosis present

## 2014-05-29 DIAGNOSIS — R310 Gross hematuria: Secondary | ICD-10-CM | POA: Diagnosis present

## 2014-05-29 DIAGNOSIS — D649 Anemia, unspecified: Secondary | ICD-10-CM | POA: Diagnosis present

## 2014-05-29 DIAGNOSIS — Z833 Family history of diabetes mellitus: Secondary | ICD-10-CM | POA: Diagnosis not present

## 2014-05-29 DIAGNOSIS — R338 Other retention of urine: Secondary | ICD-10-CM | POA: Diagnosis present

## 2014-05-29 DIAGNOSIS — E785 Hyperlipidemia, unspecified: Secondary | ICD-10-CM | POA: Diagnosis present

## 2014-05-29 DIAGNOSIS — E119 Type 2 diabetes mellitus without complications: Secondary | ICD-10-CM | POA: Diagnosis present

## 2014-05-29 DIAGNOSIS — Y95 Nosocomial condition: Secondary | ICD-10-CM | POA: Diagnosis present

## 2014-05-29 DIAGNOSIS — E876 Hypokalemia: Secondary | ICD-10-CM | POA: Diagnosis present

## 2014-05-29 DIAGNOSIS — I10 Essential (primary) hypertension: Secondary | ICD-10-CM | POA: Diagnosis present

## 2014-05-29 DIAGNOSIS — Z794 Long term (current) use of insulin: Secondary | ICD-10-CM | POA: Diagnosis not present

## 2014-05-29 DIAGNOSIS — F0391 Unspecified dementia with behavioral disturbance: Secondary | ICD-10-CM | POA: Diagnosis present

## 2014-05-29 DIAGNOSIS — R509 Fever, unspecified: Secondary | ICD-10-CM | POA: Diagnosis present

## 2014-05-29 DIAGNOSIS — J9601 Acute respiratory failure with hypoxia: Secondary | ICD-10-CM | POA: Diagnosis present

## 2014-05-29 DIAGNOSIS — Z7982 Long term (current) use of aspirin: Secondary | ICD-10-CM | POA: Diagnosis not present

## 2014-05-29 DIAGNOSIS — R6520 Severe sepsis without septic shock: Secondary | ICD-10-CM | POA: Diagnosis present

## 2014-05-29 DIAGNOSIS — N401 Enlarged prostate with lower urinary tract symptoms: Secondary | ICD-10-CM | POA: Diagnosis present

## 2014-05-29 DIAGNOSIS — G9340 Encephalopathy, unspecified: Secondary | ICD-10-CM | POA: Diagnosis present

## 2014-05-29 LAB — BASIC METABOLIC PANEL
Anion gap: 10 (ref 5–15)
BUN: 16 mg/dL (ref 6–23)
CALCIUM: 7.5 mg/dL — AB (ref 8.4–10.5)
CO2: 24 mmol/L (ref 19–32)
CREATININE: 1.28 mg/dL (ref 0.50–1.35)
Chloride: 103 mmol/L (ref 96–112)
GFR calc Af Amer: 59 mL/min — ABNORMAL LOW (ref >90)
GFR, EST NON AFRICAN AMERICAN: 51 mL/min — AB (ref >90)
Glucose, Bld: 140 mg/dL — ABNORMAL HIGH (ref 70–99)
POTASSIUM: 3 mmol/L — AB (ref 3.5–5.1)
SODIUM: 137 mmol/L (ref 135–145)

## 2014-05-29 LAB — COMPREHENSIVE METABOLIC PANEL
ALK PHOS: 43 U/L (ref 39–117)
ALT: 19 U/L (ref 0–53)
ANION GAP: 11 (ref 5–15)
AST: 27 U/L (ref 0–37)
Albumin: 3.3 g/dL — ABNORMAL LOW (ref 3.5–5.2)
BUN: 15 mg/dL (ref 6–23)
CALCIUM: 7.5 mg/dL — AB (ref 8.4–10.5)
CO2: 20 mmol/L (ref 19–32)
Chloride: 107 mmol/L (ref 96–112)
Creatinine, Ser: 1.16 mg/dL (ref 0.50–1.35)
GFR calc Af Amer: 67 mL/min — ABNORMAL LOW (ref >90)
GFR calc non Af Amer: 58 mL/min — ABNORMAL LOW (ref >90)
Glucose, Bld: 265 mg/dL — ABNORMAL HIGH (ref 70–99)
Potassium: 3.9 mmol/L (ref 3.5–5.1)
SODIUM: 138 mmol/L (ref 135–145)
TOTAL PROTEIN: 6.6 g/dL (ref 6.0–8.3)
Total Bilirubin: 0.6 mg/dL (ref 0.3–1.2)

## 2014-05-29 LAB — GLUCOSE, CAPILLARY
GLUCOSE-CAPILLARY: 278 mg/dL — AB (ref 70–99)
GLUCOSE-CAPILLARY: 267 mg/dL — AB (ref 70–99)
Glucose-Capillary: 262 mg/dL — ABNORMAL HIGH (ref 70–99)
Glucose-Capillary: 197 mg/dL — ABNORMAL HIGH (ref 70–99)
Glucose-Capillary: 180 mg/dL — ABNORMAL HIGH (ref 70–99)

## 2014-05-29 LAB — PROCALCITONIN: PROCALCITONIN: 0.11 ng/mL

## 2014-05-29 LAB — CBC WITH DIFFERENTIAL/PLATELET
Basophils Absolute: 0 10*3/uL (ref 0.0–0.1)
Basophils Relative: 0 % (ref 0–1)
EOS ABS: 0 10*3/uL (ref 0.0–0.7)
Eosinophils Relative: 0 % (ref 0–5)
HCT: 33.7 % — ABNORMAL LOW (ref 39.0–52.0)
Hemoglobin: 11 g/dL — ABNORMAL LOW (ref 13.0–17.0)
LYMPHS ABS: 0.6 10*3/uL — AB (ref 0.7–4.0)
Lymphocytes Relative: 7 % — ABNORMAL LOW (ref 12–46)
MCH: 31.5 pg (ref 26.0–34.0)
MCHC: 32.6 g/dL (ref 30.0–36.0)
MCV: 96.6 fL (ref 78.0–100.0)
MONOS PCT: 3 % (ref 3–12)
Monocytes Absolute: 0.3 10*3/uL (ref 0.1–1.0)
NEUTROS ABS: 8.2 10*3/uL — AB (ref 1.7–7.7)
NEUTROS PCT: 90 % — AB (ref 43–77)
PLATELETS: 167 10*3/uL (ref 150–400)
RBC: 3.49 MIL/uL — ABNORMAL LOW (ref 4.22–5.81)
RDW: 14.2 % (ref 11.5–15.5)
WBC: 9.1 10*3/uL (ref 4.0–10.5)

## 2014-05-29 LAB — BRAIN NATRIURETIC PEPTIDE: B Natriuretic Peptide: 32 pg/mL (ref 0.0–100.0)

## 2014-05-29 LAB — TROPONIN I: Troponin I: <0.03 ng/mL (ref <0.031)

## 2014-05-29 LAB — PROTIME-INR
INR: 1.1 (ref 0.00–1.49)
PROTHROMBIN TIME: 14.3 s (ref 11.6–15.2)

## 2014-05-29 LAB — APTT: aPTT: 31 seconds (ref 24–37)

## 2014-05-29 LAB — LACTIC ACID, PLASMA: LACTIC ACID, VENOUS: 2.8 mmol/L — AB (ref 0.5–2.0)

## 2014-05-29 MED ORDER — IPRATROPIUM-ALBUTEROL 0.5-2.5 (3) MG/3ML IN SOLN
3.0000 mL | Freq: Four times a day (QID) | RESPIRATORY_TRACT | Status: DC
  Administered 2014-05-29 – 2014-05-30 (×7): 3 mL via RESPIRATORY_TRACT
  Filled 2014-05-29 (×7): qty 3

## 2014-05-29 MED ORDER — POTASSIUM CHLORIDE CRYS ER 20 MEQ PO TBCR
40.0000 meq | EXTENDED_RELEASE_TABLET | Freq: Once | ORAL | Status: AC
  Administered 2014-05-29: 40 meq via ORAL
  Filled 2014-05-29: qty 2

## 2014-05-29 MED ORDER — VANCOMYCIN HCL IN DEXTROSE 1-5 GM/200ML-% IV SOLN
1000.0000 mg | Freq: Once | INTRAVENOUS | Status: AC
  Administered 2014-05-29: 1000 mg via INTRAVENOUS
  Filled 2014-05-29: qty 200

## 2014-05-29 MED ORDER — FINASTERIDE 5 MG PO TABS
5.0000 mg | ORAL_TABLET | Freq: Every day | ORAL | Status: DC
  Administered 2014-05-29 – 2014-06-02 (×5): 5 mg via ORAL
  Filled 2014-05-29 (×7): qty 1

## 2014-05-29 MED ORDER — LORAZEPAM 0.5 MG PO TABS
0.5000 mg | ORAL_TABLET | Freq: Two times a day (BID) | ORAL | Status: DC | PRN
  Administered 2014-05-29 – 2014-05-31 (×4): 1 mg via ORAL
  Administered 2014-06-01 (×2): 0.5 mg via ORAL
  Filled 2014-05-29 (×4): qty 2
  Filled 2014-05-29 (×2): qty 1

## 2014-05-29 MED ORDER — INSULIN GLARGINE 100 UNIT/ML ~~LOC~~ SOLN
12.0000 [IU] | Freq: Every day | SUBCUTANEOUS | Status: DC
  Administered 2014-05-29 – 2014-05-30 (×3): 12 [IU] via SUBCUTANEOUS
  Filled 2014-05-29 (×4): qty 0.12

## 2014-05-29 MED ORDER — IPRATROPIUM-ALBUTEROL 0.5-2.5 (3) MG/3ML IN SOLN
3.0000 mL | RESPIRATORY_TRACT | Status: DC
  Administered 2014-05-29: 3 mL via RESPIRATORY_TRACT
  Filled 2014-05-29: qty 3

## 2014-05-29 MED ORDER — DEXTROSE 5 % IV SOLN
1.0000 g | Freq: Three times a day (TID) | INTRAVENOUS | Status: DC
  Filled 2014-05-29 (×2): qty 1

## 2014-05-29 MED ORDER — ASPIRIN EC 81 MG PO TBEC
81.0000 mg | DELAYED_RELEASE_TABLET | Freq: Two times a day (BID) | ORAL | Status: DC
  Administered 2014-05-29 (×2): 81 mg via ORAL
  Filled 2014-05-29 (×2): qty 1

## 2014-05-29 MED ORDER — PANTOPRAZOLE SODIUM 40 MG PO TBEC
40.0000 mg | DELAYED_RELEASE_TABLET | Freq: Every day | ORAL | Status: DC
  Administered 2014-05-29 – 2014-06-02 (×5): 40 mg via ORAL
  Filled 2014-05-29 (×5): qty 1

## 2014-05-29 MED ORDER — SIMVASTATIN 20 MG PO TABS
20.0000 mg | ORAL_TABLET | Freq: Every day | ORAL | Status: DC
  Administered 2014-05-29 – 2014-06-01 (×4): 20 mg via ORAL
  Filled 2014-05-29 (×4): qty 1

## 2014-05-29 MED ORDER — SODIUM CHLORIDE 0.9 % IV BOLUS (SEPSIS)
1000.0000 mL | Freq: Once | INTRAVENOUS | Status: AC
  Administered 2014-05-29: 1000 mL via INTRAVENOUS

## 2014-05-29 MED ORDER — FINASTERIDE 5 MG PO TABS
ORAL_TABLET | ORAL | Status: AC
  Filled 2014-05-29: qty 1

## 2014-05-29 MED ORDER — VANCOMYCIN HCL IN DEXTROSE 750-5 MG/150ML-% IV SOLN
750.0000 mg | Freq: Two times a day (BID) | INTRAVENOUS | Status: DC
  Administered 2014-05-29 – 2014-06-01 (×7): 750 mg via INTRAVENOUS
  Filled 2014-05-29 (×13): qty 150

## 2014-05-29 MED ORDER — MORPHINE SULFATE 2 MG/ML IJ SOLN
1.0000 mg | INTRAMUSCULAR | Status: DC | PRN
  Administered 2014-05-29: 1 mg via INTRAVENOUS
  Filled 2014-05-29: qty 1

## 2014-05-29 MED ORDER — IPRATROPIUM-ALBUTEROL 0.5-2.5 (3) MG/3ML IN SOLN: 3.0000 mL | RESPIRATORY_TRACT | Status: DC | PRN

## 2014-05-29 MED ORDER — MEMANTINE HCL 10 MG PO TABS
10.0000 mg | ORAL_TABLET | Freq: Two times a day (BID) | ORAL | Status: DC
  Administered 2014-05-29 – 2014-06-02 (×9): 10 mg via ORAL
  Filled 2014-05-29 (×9): qty 1

## 2014-05-29 MED ORDER — DEXTROSE 5 % IV SOLN
2.0000 g | Freq: Once | INTRAVENOUS | Status: AC
  Administered 2014-05-29: 2 g via INTRAVENOUS
  Filled 2014-05-29: qty 2

## 2014-05-29 MED ORDER — POTASSIUM CHLORIDE 10 MEQ/100ML IV SOLN
10.0000 meq | Freq: Once | INTRAVENOUS | Status: AC
  Administered 2014-05-29: 10 meq via INTRAVENOUS
  Filled 2014-05-29: qty 100

## 2014-05-29 MED ORDER — CLOPIDOGREL BISULFATE 75 MG PO TABS
75.0000 mg | ORAL_TABLET | Freq: Every day | ORAL | Status: DC
  Administered 2014-05-29: 75 mg via ORAL
  Filled 2014-05-29: qty 1

## 2014-05-29 MED ORDER — QUETIAPINE FUMARATE 25 MG PO TABS
ORAL_TABLET | ORAL | Status: AC
  Filled 2014-05-29: qty 2

## 2014-05-29 MED ORDER — QUETIAPINE FUMARATE 25 MG PO TABS
50.0000 mg | ORAL_TABLET | Freq: Two times a day (BID) | ORAL | Status: DC
  Administered 2014-05-29 – 2014-06-02 (×10): 50 mg via ORAL
  Filled 2014-05-29 (×2): qty 2
  Filled 2014-05-29: qty 1
  Filled 2014-05-29 (×2): qty 2
  Filled 2014-05-29: qty 1
  Filled 2014-05-29: qty 2
  Filled 2014-05-29: qty 1
  Filled 2014-05-29 (×3): qty 2

## 2014-05-29 MED ORDER — DEXTROSE 5 % IV SOLN
1.0000 g | Freq: Three times a day (TID) | INTRAVENOUS | Status: DC
  Administered 2014-05-29 – 2014-06-01 (×9): 1 g via INTRAVENOUS
  Filled 2014-05-29 (×19): qty 1

## 2014-05-29 MED ORDER — INSULIN ASPART 100 UNIT/ML ~~LOC~~ SOLN
0.0000 [IU] | SUBCUTANEOUS | Status: DC
  Administered 2014-05-29: 2 [IU] via SUBCUTANEOUS
  Administered 2014-05-29: 5 [IU] via SUBCUTANEOUS
  Administered 2014-05-29: 2 [IU] via SUBCUTANEOUS
  Administered 2014-05-29: 5 [IU] via SUBCUTANEOUS
  Administered 2014-05-29 – 2014-05-30 (×4): 3 [IU] via SUBCUTANEOUS
  Administered 2014-05-30: 2 [IU] via SUBCUTANEOUS
  Administered 2014-05-30 (×2): 3 [IU] via SUBCUTANEOUS
  Administered 2014-05-31 (×2): 2 [IU] via SUBCUTANEOUS
  Administered 2014-05-31: 5 [IU] via SUBCUTANEOUS
  Administered 2014-05-31: 2 [IU] via SUBCUTANEOUS
  Administered 2014-05-31: 3 [IU] via SUBCUTANEOUS

## 2014-05-29 MED ORDER — PHENAZOPYRIDINE HCL 100 MG PO TABS
100.0000 mg | ORAL_TABLET | Freq: Three times a day (TID) | ORAL | Status: DC
  Administered 2014-05-29 – 2014-06-01 (×10): 100 mg via ORAL
  Filled 2014-05-29 (×10): qty 1

## 2014-05-29 MED ORDER — METOPROLOL TARTRATE 25 MG PO TABS
25.0000 mg | ORAL_TABLET | Freq: Two times a day (BID) | ORAL | Status: DC
  Administered 2014-05-29 – 2014-06-02 (×9): 25 mg via ORAL
  Filled 2014-05-29 (×10): qty 1

## 2014-05-29 MED ORDER — METHYLPREDNISOLONE SODIUM SUCC 125 MG IJ SOLR
125.0000 mg | Freq: Two times a day (BID) | INTRAMUSCULAR | Status: DC
  Administered 2014-05-29 – 2014-05-30 (×4): 125 mg via INTRAVENOUS
  Filled 2014-05-29 (×4): qty 2

## 2014-05-29 MED ORDER — HEPARIN SODIUM (PORCINE) 5000 UNIT/ML IJ SOLN
5000.0000 [IU] | Freq: Three times a day (TID) | INTRAMUSCULAR | Status: DC
  Administered 2014-05-29: 5000 [IU] via SUBCUTANEOUS
  Filled 2014-05-29 (×2): qty 1

## 2014-05-29 NOTE — Care Management Note (Addendum)
    Page 1 of 1   06/01/2014     11:35:33 AM CARE MANAGEMENT NOTE 06/01/2014  Patient:  Markus JarvisBULLINS,Treyven H   Account Number:  [id number]  Date Initiated:  05/29/2014  Documentation initiated by:  Kathyrn SheriffHILDRESS,JESSICA  Subjective/Objective Assessment:   Pt is from home with 24/7 PD aids. [facility] Pt has a cane/walker/wheelchair/neb machine at home. Pt plans to discharge home. Anticipate need [facility] for close follow up. Will cont to follow.     Action/Plan:   Anticipated DC Date:  06/01/2014   Anticipated DC Plan:  HOME W HOME HEALTH SERVICES      DC Planning Services  CM consult      [facility]  Resumption Of Svcs/PTA [place] [place]   Choice offered to / List presented to:          [facility]  HH[facility]2 PT  HH-1 RN      [facility]  [facility]   Status of service:  In process, will continue to follow Medicare Important Message given?  YES (If response is "NO", the following Medicare IM given date fields will be blank) Date Medicare IM given:  06/01/2014 Medicare IM given by:  Kathyrn SheriffHILDRESS,JESSICA Date Additional Medicare IM given:   Additional Medicare IM given by:    Discharge Disposition:  HOME W HOME HEALTH SERVICES  Per UR Regulation:  Reviewed for med. necessity/level of care/duration of stay  If discussed at Long Length of Stay Meetings, dates discussed:    Comments:  06/01/2014 1134 Kathyrn SheriffJessica Childress, RN, MSN, CM 05/29/2014 0900 Kathyrn SheriffJessica Childress, RN, MSN, CM

## 2014-05-29 NOTE — Progress Notes (Signed)
Patient c/o urge to void but unable to.  Bladder scan showed >92999ml of urine in bladder.  Patient had blood all around penis and groin area.  Helped patient get cleaned up.  Notified Dr. Ardyth HarpsHernandez.  Will continue to monitor patient.

## 2014-05-29 NOTE — ED Notes (Signed)
Patient went to void in urinal, patient states he has a pain, and urinated a blood clot.

## 2014-05-29 NOTE — ED Notes (Signed)
CRITICAL VALUE ALERT  Critical value received: Lactic Acid 2.7 Date of notification:  04/29/14  Time of notification:  1358  Critical value read back:Yes.    Nurse who received alert:  Jill AlexandersJustin RN  MD notified (1st page):  Preston FleetingGlick  Time of first page:  0000  MD notified (2nd page):  Time of second page:  Responding MD:  Preston FleetingGlick  Time MD responded:  0000

## 2014-05-29 NOTE — Progress Notes (Signed)
ANTIBIOTIC CONSULT NOTE - FOLLOW UP  Pharmacy Consult for Vancomycin & Cefepime Indication: pneumonia  Allergies  Allergen Reactions  . Fexofenadine Hcl Other (See Comments)    Heart attack symptoms  . Flonase [Fluticasone Propionate] Other (See Comments)    Heart attack symptoms  . Nasal Spray Other (See Comments)    Heart attack symptoms   . Nasal Spray     CAN NOT TOLERATE ANY NASAL SPRAYS!!!!!  Causes heart attack symptoms    Patient Measurements: Height:  (180.3 cm) Weight: 220 lb 0.3 oz (99.8 kg) IBW/kg (Calculated) : 75.3  Vital Signs: Temp: 96.6 F (35.9 C) (03/29 0733) Temp Source: Axillary (03/29 0733) BP: 118/47 mmHg (03/29 0700) Pulse Rate: 79 (03/29 0700) Intake/Output from previous day: 03/28 0701 - 03/29 0700 In: 3500 [I.V.:3500] Out: 1025 [Urine:1025] Intake/Output from this shift:    Labs:  Recent Labs  05/27/14 1126 05/28/14 2330 05/29/14 0216  WBC 7.3 8.6 9.1  HGB 12.5* 10.5* 11.0*  PLT 208 159 167  CREATININE 1.15 1.28 1.16   Estimated Creatinine Clearance: 61.1 mL/min (by C-G formula based on Cr of 1.16). No results for input(s): VANCOTROUGH, VANCOPEAK, VANCORANDOM, GENTTROUGH, GENTPEAK, GENTRANDOM, TOBRATROUGH, TOBRAPEAK, TOBRARND, AMIKACINPEAK, AMIKACINTROU, AMIKACIN in the last 72 hours.   Microbiology: Recent Results (from the past 720 hour(s))  Urine culture     Status: None   Collection Time: 05/06/14  7:55 PM  Result Value Ref Range Status   Specimen Description URINE, CATHETERIZED  Final   Special Requests NONE  Final   Colony Count NO GROWTH Performed at [facility]   Final   Culture NO GROWTH Performed at [facility]   Final   Report Status 05/08/2014 FINAL  Final  Urine culture     Status: None   Collection Time: 05/11/14 11:21 AM  Result Value Ref Range Status   Urine Culture, Routine Final report  Final   Result 1 No growth  Final  Urine culture     Status: None   Collection Time:  05/25/14  2:20 AM  Result Value Ref Range Status   Specimen Description URINE, CATHETERIZED  Final   Special Requests NONE  Final   Colony Count NO GROWTH Performed at [facility]   Final   Culture NO GROWTH Performed at [facility]   Final   Report Status 05/26/2014 FINAL  Final  Culture, blood (routine x 2) Call MD if unable to obtain prior to antibiotics being given     Status: None (Preliminary result)   Collection Time: 05/25/14  4:47 AM  Result Value Ref Range Status   Specimen Description BLOOD LEFT HAND  Final   Special Requests BOTTLES DRAWN AEROBIC AND ANAEROBIC 6CC  Final   Culture NO GROWTH 3 DAYS  Final   Report Status PENDING  Incomplete  Culture, blood (routine x 2) Call MD if unable to obtain prior to antibiotics being given     Status: None (Preliminary result)   Collection Time: 05/25/14  4:59 AM  Result Value Ref Range Status   Specimen Description BLOOD RIGHT HAND  Final   Special Requests BOTTLES DRAWN AEROBIC AND ANAEROBIC 6CC  Final   Culture NO GROWTH 3 DAYS  Final   Report Status PENDING  Incomplete  MRSA PCR Screening     Status: None   Collection Time: 05/25/14  1:30 PM  Result Value Ref Range Status   MRSA by PCR NEGATIVE NEGATIVE Final  Culture, blood (routine x  2)     Status: None (Preliminary result)   Collection Time: 05/28/14 11:30 PM  Result Value Ref Range Status   Specimen Description Blood  Final   Special Requests BOTTLES DRAWN AEROBIC AND ANAEROBIC 10CC EACH  Final   Culture PENDING  Incomplete   Report Status PENDING  Incomplete  Culture, blood (routine x 2)     Status: None (Preliminary result)   Collection Time: 05/28/14 11:30 PM  Result Value Ref Range Status   Specimen Description BLOOD RIGHT HAND  Final   Special Requests   Final    BOTTLES DRAWN AEROBIC AND ANAEROBIC AEB 6CC ANA 4CC   Culture PENDING  Incomplete   Report Status PENDING  Incomplete    Anti-infectives    Start     Dose/Rate Route Frequency  Ordered Stop   05/29/14 1000  ceFEPIme (MAXIPIME) 1 g in dextrose 5 % 50 mL IVPB     1 g 100 mL/hr over 30 Minutes Intravenous Every 8 hours 05/29/14 0229 06/06/14 0959   05/29/14 0145  ceFEPIme (MAXIPIME) 1 g in dextrose 5 % 50 mL IVPB  Status:  Discontinued     1 g 100 mL/hr over 30 Minutes Intravenous 3 times per day 05/29/14 0138 05/29/14 0227   05/29/14 0100  ceFEPIme (MAXIPIME) 2 g in dextrose 5 % 50 mL IVPB     2 g 100 mL/hr over 30 Minutes Intravenous  Once 05/29/14 0044 05/29/14 0204   05/29/14 0015  vancomycin (VANCOCIN) IVPB 1000 mg/200 mL premix     1,000 mg 200 mL/hr over 60 Minutes Intravenous  Once 05/29/14 0012 05/29/14 0133      Assessment: 81 yo M who was admitted 3/25-27 with HCAP returned to ED with progressive shortness of breath & cough.  Re-admitted with acute respiratory failure and sepsis.  He was prescribed Levaquin at discharge on 3/27, but never started taking this medication.  Restarted on empiric, broad-spectrum antibiotics for HCAP.  CXR shows improved aeration.   He is afebrile with normal WBC.  Renal function is at patient's baseline.  NCrCl ~ 50-7555ml/min  Vanc 3/25>>27, 3/29>> Cefepime 3/25>>27, 3/29>>  Goal of Therapy:  Vancomycin trough level 15-20 mcg/ml  Plan:  Cefepime 1gm IV q8h Vancomycin 750mg  IV q12h Check Vancomycin trough at steady state Monitor renal function and cx data  Duration of therapy per MD  Elson ClanLilliston, Acadia Thammavong Michelle 05/29/2014,7:43 AM

## 2014-05-29 NOTE — ED Notes (Signed)
Darrall DearsKim Seabolt- Daughter in Social workerlaw [phone](home)

## 2014-05-29 NOTE — Progress Notes (Signed)
ANTIBIOTIC CONSULT NOTE-Preliminary  Pharmacy Consult for cefepime Indication: HCAP  Allergies  Allergen Reactions  . Fexofenadine Hcl Other (See Comments)    Heart attack symptoms  . Flonase [Fluticasone Propionate] Other (See Comments)    Heart attack symptoms  . Nasal Spray Other (See Comments)    Heart attack symptoms   . Nasal Spray     CAN NOT TOLERATE ANY NASAL SPRAYS!!!!!  Causes heart attack symptoms    Patient Measurements: Height: 6' (182.9 cm) Weight: 220 lb (99.791 kg) IBW/kg (Calculated) : 77.6  Vital Signs: Temp: 100.8 F (38.2 C) (03/28 2247) Temp Source: Rectal (03/28 2247) BP: 169/69 mmHg (03/29 0030) Pulse Rate: 108 (03/29 0030)  Labs:  Recent Labs  05/27/14 1126 05/28/14 2330  WBC 7.3 8.6  HGB 12.5* 10.5*  PLT 208 159  CREATININE 1.15 1.28    Estimated Creatinine Clearance: 56.3 mL/min (by C-G formula based on Cr of 1.28).   Microbiology: Blood culture x 1 on 3/28 at 2330 - pending Urine culture x 1 on 3/28 at 2245 - pending  Medical History: Past Medical History  Diagnosis Date  . Diabetes mellitus type 2   . GERD (gastroesophageal reflux disease)   . Hyperlipidemia     takes Niacin nightly  . DJD (degenerative joint disease)   . Hypertension     takes Metoprolol and Ramipril daily  . Dizziness   . Gout     hx of--7797yrs ago  . Bruises easily     pt is on Plavix and ASA  . Shortness of breath     wakes pt up during night with occ shortness of breathe;states it happens about once a month  . Impaired hearing   . Urinary frequency   . UTI (lower urinary tract infection)     hx of--03/2010  . Anemia     takes Glyburide bid  . CAD (coronary artery disease) of artery bypass graft   . Pneumonia     "at least twice that I know of"  . Seizures 05/14/11    "first time was today"  . CVA (cerebral vascular accident) ~ 2009    right arm/leg occ hurts  . Carotid artery occlusion 2013    s/p bilateral endarterectomy  . Chronic  headache   . Chronic neck pain   . Dementia with behavioral disturbance   . Dyspnea     chronic, daily    Medications:  Vancomycin 1g IV x 1 given on 3/29 at 0024  Assessment: Pt is an 81 yo M being initiated on cefepime and vancomycin for possible HCAP.   Plan:  Preliminary review of pertinent patient information completed.  Protocol will be initiated with a one-time dose(s) of cefepime 2g IV x 1.  Jeani HawkingAnnie Penn clinical pharmacist will complete review during morning rounds to assess patient and finalize treatment regimen.  Lenore MannerHolcombe, Nayvie Lips [place], [place] 05/29/2014,12:37 AM

## 2014-05-29 NOTE — Progress Notes (Addendum)
Dr Ardyth HarpsHernandez and Dr Annabell HowellsWrenn notified for renal us results.

## 2014-05-29 NOTE — ED Notes (Signed)
Pt's family member called said nurse in the room stating pt is urinating blood; in to see pt and pt states it hurts when he urinates; pt's nurse informed and stated pt was cathed and urine looked the same

## 2014-05-29 NOTE — Consult Note (Addendum)
Subjective: I was asked to see [name] by Dr. Ardyth Harps for gross hematuria.   He had the onset of gross hematuria about 2 nights ago when he was in the hospital for a UTI.  He was discharged and had to return last night for MS changes.  He reported passage of clots again and developed dysuria with suprapubic pain and has been unable void since 10:30 AM.   A CT on 3/25 showed a large prostate with an intravesical middle lobe.  A renal US today showed a 9cm middle lobe but no obvious clots in the bladder.   He had no hydro.  He has no prior voiding complaints apart from nocturia x 2.   He has had no medical or surgical therapy for the prostate.  He has had prior UTI's but no stones.  He is on plavix and asa for his history of cardiac and peripheral vascular disease.  ROS:  Review of Systems  Constitutional: Positive for chills. Negative for fever.  Respiratory: Positive for shortness of breath.   Cardiovascular: Positive for leg swelling. Negative for chest pain.  Gastrointestinal: Positive for abdominal pain. Negative for nausea, vomiting, diarrhea and constipation.  Genitourinary: Positive for dysuria and hematuria.  Musculoskeletal: Positive for joint pain.  Neurological:       Memory difficulties.  Right hand tremor and right sided weakness.   Endo/Heme/Allergies: Bruises/bleeds easily.  All other systems reviewed and are negative.  Allergies  Allergen Reactions  . Fexofenadine Hcl Other (See Comments)    Heart attack symptoms  . Flonase [Fluticasone Propionate] Other (See Comments)    Heart attack symptoms  . Nasal Spray Other (See Comments)    Heart attack symptoms   . Nasal Spray     CAN NOT TOLERATE ANY NASAL SPRAYS!!!!!  Causes heart attack symptoms    Past Medical History  Diagnosis Date  . Diabetes mellitus type 2   . GERD (gastroesophageal reflux disease)   . Hyperlipidemia     takes Niacin nightly  . DJD (degenerative joint disease)   . Hypertension      takes Metoprolol and Ramipril daily  . Dizziness   . Gout     hx of--26yrs ago  . Bruises easily     pt is on Plavix and ASA  . Shortness of breath     wakes pt up during night with occ shortness of breathe;states it happens about once a month  . Impaired hearing   . Urinary frequency   . UTI (lower urinary tract infection)     hx of--03/2010  . Anemia     takes Glyburide bid  . CAD (coronary artery disease) of artery bypass graft   . Pneumonia     "at least twice that I know of"  . Seizures 05/14/11    "first time was today"  . CVA (cerebral vascular accident) ~ 2009    right arm/leg occ hurts  . Carotid artery occlusion 2013    s/p bilateral endarterectomy  . Chronic headache   . Chronic neck pain   . Dementia with behavioral disturbance   . Dyspnea     chronic, daily    Past Surgical History  Procedure Laterality Date  . Appendectomy  1968  . Colonoscopy    . Endarterectomy  04/09/2011    Procedure: ENDARTERECTOMY CAROTID;  Surgeon: Chuck Hint, MD;  Location: [facility] OR;  Service: Vascular;  Laterality: Left;  Left Carotid endarterectomy With Dacron Patch Angioplasty  .  Cardiac catheterization  1998    Dr. Antoine PocheHochrein  . Endarterectomy  05/08/2011    Procedure: ENDARTERECTOMY CAROTID;  Surgeon: Chuck Hinthristopher S Dickson, MD;  Location[facility];  Service: Vascular;  Laterality: Right;  with [place] Patch Angioplasty  . Cataract extraction w/ intraocular lens  implant, bilateral  2012  . Coronary artery bypass graft  1998    LIMA to LAD, SVG to diagonal, SVG to PDA, SVG to posterior lateral.   . Eye surgery  1953    right ; "piece of metal removed"  . Eye surgery  2012    Cataract- Bilateral    History   Social History  . Marital Status: Married    Spouse Name: N/A  . Number of Children: N/A  . Years of Education: N/A   Occupational History  . retired     [employer]   Social History Main Topics  . Smoking status: Former Smoker -- 1.00 packs/day for 20 years     Types: Cigarettes    Quit date: 03/02/1974  . Smokeless tobacco: Never Used  . Alcohol Use: No     Comment: quit in 1981  . Drug Use: No  . Sexual Activity: No   Other Topics Concern  . Not on file   Social History Narrative    Family History  Problem Relation Age of Onset  . Diabetes Sister   . Cancer Brother   . Diabetes Brother   . Anesthesia problems Neg Hx   . Hypotension Neg Hx   . Malignant hyperthermia Neg Hx   . Pseudochol deficiency Neg Hx   . Hearing loss Father     Anti-infectives: Anti-infectives    Start     Dose/Rate Route Frequency Ordered Stop   05/29/14 1200  vancomycin (VANCOCIN) IVPB 750 mg/150 ml premix     750 mg 150 mL/hr over 60 Minutes Intravenous Every 12 hours 05/29/14 0800     05/29/14 1000  ceFEPIme (MAXIPIME) 1 g in dextrose 5 % 50 mL IVPB     1 g 100 mL/hr over 30 Minutes Intravenous Every 8 hours 05/29/14 0229 06/06/14 0959   05/29/14 0145  ceFEPIme (MAXIPIME) 1 g in dextrose 5 % 50 mL IVPB  Status:  Discontinued     1 g 100 mL/hr over 30 Minutes Intravenous 3 times per day 05/29/14 0138 05/29/14 0227   05/29/14 0100  ceFEPIme (MAXIPIME) 2 g in dextrose 5 % 50 mL IVPB     2 g 100 mL/hr over 30 Minutes Intravenous  Once 05/29/14 0044 05/29/14 0204   05/29/14 0015  vancomycin (VANCOCIN) IVPB 1000 mg/200 mL premix     1,000 mg 200 mL/hr over 60 Minutes Intravenous  Once 05/29/14 0012 05/29/14 0133      Current Facility-Administered Medications  Medication Dose Route Frequency Provider Last Rate Last Dose  . 0.9 %  sodium chloride infusion  1,000 mL Intravenous Continuous Dione Boozeavid Glick, MD   Stopped at 05/29/14 [id number]  . ceFEPIme (MAXIPIME) 1 g in dextrose 5 % 50 mL IVPB  1 g Intravenous Q8H Floydene FlockSteven J Newton, MD   1 g at 05/29/14 1000  . heparin injection 5,000 Units  5,000 Units Subcutaneous 3 times per day Floydene FlockSteven J Newton, MD   5,000 Units at 05/29/14 0430  . insulin aspart (novoLOG) injection 0-9 Units  0-9 Units Subcutaneous 6 times per  day Floydene FlockSteven J Newton, MD   2 Units at 05/29/14 1235  . insulin glargine (LANTUS) injection 12 Units  12 Units Subcutaneous QHS Floydene Flock, MD   12 Units at 05/29/14 [phone]  . ipratropium-albuterol (DUONEB) 0.5-2.5 (3) MG/3ML nebulizer solution 3 mL  3 mL Nebulization Q2H PRN Floydene Flock, MD      . ipratropium-albuterol (DUONEB) 0.5-2.5 (3) MG/3ML nebulizer solution 3 mL  3 mL Nebulization Q6H Luane School, RRT   3 mL at 05/29/14 1411  . LORazepam (ATIVAN) tablet 0.5-1 mg  0.5-1 mg Oral BID PRN Floydene Flock, MD   1 mg at 05/29/14 0813  . memantine (NAMENDA) tablet 10 mg  10 mg Oral BID Floydene Flock, MD   10 mg at 05/29/14 1000  . methylPREDNISolone sodium succinate (SOLU-MEDROL) 125 mg/2 mL injection 125 mg  125 mg Intravenous Q12H Floydene Flock, MD   125 mg at 05/29/14 1517  . metoprolol tartrate (LOPRESSOR) tablet 25 mg  25 mg Oral BID Floydene Flock, MD   25 mg at 05/29/14 1000  . morphine 2 MG/ML injection 1 mg  1 mg Intravenous Q4H PRN [name] Cloud, MD   1 mg at 05/29/14 1407  . pantoprazole (PROTONIX) EC tablet 40 mg  40 mg Oral Daily Floydene Flock, MD   40 mg at 05/29/14 0959  . phenazopyridine (PYRIDIUM) tablet 100 mg  100 mg Oral TID WC Estela Isaiah Blakes, MD   100 mg at 05/29/14 0959  . QUEtiapine (SEROQUEL) tablet 50 mg  50 mg Oral BID Floydene Flock, MD   50 mg at 05/29/14 9604  . simvastatin (ZOCOR) tablet 20 mg  20 mg Oral q1800 Floydene Flock, MD      . vancomycin (VANCOCIN) IVPB 750 mg/150 ml premix  750 mg Intravenous Q12H Phylliss Blakes, RPH   750 mg at 05/29/14 1114     Objective: Vital signs in last 24 hours: Temp:  [96.6 F (35.9 C)-100.8 F (38.2 C)] 97.8 F (36.6 C) (03/29 1444) Pulse Rate:  [79-125] 96 (03/29 1444) Resp:  [16-31] 20 (03/29 1444) BP: (113-203)/(45-100) 171/89 mmHg (03/29 1444) SpO2:  [93 %-100 %] 96 % (03/29 1444) Weight:  [99.791 kg (220 lb)-100 kg (220 lb 7.4 oz)] 99.8 kg (220 lb 0.3 oz) (03/29  0500)  Intake/Output from previous day: 03/28 0701 - 03/29 0700 In: 3500 [I.V.:3500] Out: 1025 [Urine:1025] Intake/Output this shift: Total I/O In: 680 [P.O.:480; IV Piggyback:200] Out: 550 [Urine:550]   Physical Exam  Constitutional: He is oriented to person, place, and time and well-developed, well-nourished, and in no distress. No distress.  HENT:  Head: Normocephalic and atraumatic.  Neck: Normal range of motion. Neck supple.  Cardiovascular: Normal rate, regular rhythm and normal heart sounds.   No murmur heard. Pulmonary/Chest: Effort normal and breath sounds normal. No respiratory distress.  Abdominal: Bowel sounds are normal. He exhibits mass. There is tenderness.  Obese with suprapubic tenderness and mass.   No hernias noted.   Genitourinary:  Normal circumcised phallus with blood at the meatus.  Scrotum and testes unremarkable.  Anus and perineum normal.  NST.  No mass Prostate 3+ without nodules.  SV's non-palpable.   Musculoskeletal: Normal range of motion. He exhibits edema. He exhibits no tenderness.  Lymphadenopathy:    He has no cervical adenopathy.  Neurological: He is alert and oriented to person, place, and time.  No motor or sensory deficits  Skin: Skin is warm and dry.  Psychiatric: Mood and affect normal.  Vitals reviewed.   Lab Results:   Recent Labs  05/28/14 2330 05/29/14 0216  WBC 8.6 9.1  HGB 10.5* 11.0*  HCT 31.6* 33.7*  PLT 159 167   BMET  Recent Labs  05/28/14 2330 05/29/14 0216  NA 137 138  K 3.0* 3.9  CL 103 107  CO2 24 20  GLUCOSE 140* 265*  BUN 16 15  CREATININE 1.28 1.16  CALCIUM 7.5* 7.5*   PT/INR  Recent Labs  05/29/14 0216  LABPROT 14.3  INR 1.10   ABG No results for input(s): PHART, HCO3 in the last 72 hours.  Invalid input(s): PCO2, PO2  Studies/Results: Dg Chest 2 View  05/29/2014   CLINICAL DATA:  Shortness of breath cough for 5 months.  EXAM: CHEST  2 VIEW  COMPARISON:  Portable AP view 05/25/2014   FINDINGS: Patient is post median sternotomy with multiple mediastinal clips and intact sternal wires. Improved aeration at the left lung base. Pulmonary vasculature is normal. No consolidation, pleural effusion, or pneumothorax. No acute osseous abnormalities are seen.  IMPRESSION: No acute pulmonary process. Improved aeration at the left lung base.   Electronically Signed   By: Rubye Oaks M.D.   On: 05/29/2014 00:21   US Renal  05/29/2014   CLINICAL DATA:  Gross hematuria  EXAM: RENAL/URINARY TRACT ULTRASOUND COMPLETE  COMPARISON:  CT abdomen and pelvis May 25, 2014  FINDINGS: Right Kidney:  Length: 10.6 cm. Echogenicity and renal cortical thickness are within normal limits. No mass, perinephric fluid, or hydronephrosis visualized. No sonographically demonstrable calculus or ureterectasis.  Left Kidney:  Length: 11.8 cm. Echogenicity and renal cortical thickness are within normal limits. No mass, perinephric fluid, or hydronephrosis visualized. No sonographically demonstrable calculus or ureterectasis.  Bladder:  There is an apparent mass arising from the wall of the inferior urinary bladder measuring 8.1 x 5.2 x 0.9 cm.  IMPRESSION: Apparent mass arising from the inferior urinary bladder. Advise cystoscopy to further assess. Kidneys appear normal bilaterally.  These results will be called to the ordering clinician or representative by the Radiologist Assistant, and communication documented in the PACS or zVision Dashboard.   Electronically Signed   By: Bretta Bang III M.D.   On: 05/29/2014 10:45   I discussed his case with Dr. Ardyth Harps.   I have reviewed his hospital chart and meds.   Past, Family and Social history reviewed.  Labs reviewed.  CT and [place] films and reports reviewed.    A 28fr coude foley was placed using sterile technique without difficulty.  There was an initial return of about 2-300 ml of bloody urine with clots.  I then aspirated an additional with a [place] syringe  and then irrigated with of NS with return of several hundred ml of clots.  The return eventually cleared and the catheter was placed to a drainage bag.    Assessment: Very large prostate with intravesical middle lobe and bleeding with clot retention probably aggravated by plavix and asa.  Plan: Continue foley drainage and irrigation prn. Hold plavix and ASA. I will start finasteride. With the degree of retention, he will probably need a week of drainage prior to a voiding trial.  I have stopped his heparin VTE prophylaxis and will order SCD's.   CC: Dr. Philip Aspen.     LOS: 0 days    Anner Crete 05/29/2014

## 2014-05-29 NOTE — H&P (Addendum)
Hospitalist Admission History and Physical  Patient name: [name] Medical record number: [id number] Date of birth: 28-Jul-1931 Age: 81 y.o. Gender: male  Primary Care Provider: Frederica Kuster, MD  Chief Complaint: acute resp failure, HCAP, sepsis, hematuria   History of Present Illness:This is a 81 y.o. year old male with significant past medical history of IDDM, HTN, CAD s/p CABG, CVA, seizures, COPD  presenting with acute resp failure, HCAP, sepsis, hematuria. Patient noted to have been recently discharged from the hospital yesterday for severe sepsis secondary to healthcare associated pneumonia. Per the record, patient was asking to be discharged home. Per report, patient with progressive onset of shortness of breath, wheezing, cough over the course of the day. No fevers or chills. No nausea or vomiting. EMS subsequently called. Patient given Solu-Medrol as well as nebulized treatment. Patient presents to the ER temperature 100.8, heart rate in the 100s to 120s, respirations in the 20s, blood pressure in the 130s to 160s. Satting in the mid 90s on 2 L. White blood cell count 8.6, hemoglobin 10.5, creatinine 1.28, potassium 3.0. Troponin negative 1. Chest x-ray negative for pneumonia. Patient also with report of posttraumatic hematuria. Patient required in and out cath for urine sample. Patient with noted hematuria status post this. Patient denies any dysuria or increased urinary frequency prior to onset. Noted baseline BPH.  Assessment and Plan: [name] is a 81 y.o. year old male presenting with acute resp failure, HCAP, sepsis, hematuria   Active Problems:   HCAP (healthcare-associated pneumonia)   1- Sepsis -meets criteria based on T, HR -likely secondary to pulm sources -HCAP treatment given recent admission for CAP -blood cultures -supplemental O2 -urine strep and legionella -trend lactate -follow  2- Acute resp failure/HCAP  -likely secondary to  HCAP recurrence -CXR preliminarily negative -IV vanc and cefepime -cxs -? Obstructive component -IV solumedrol  -duonebs -supplemental O2 -prn  3- hematuria -? Post traumatic in setting of in and out cath -noted to be on ASA and plavix -in review of chart, pt w/ multiple recent CT scans -will follow hematuria symptomatically -consider CT and holding antiplatelet agents if significantly worse -urology consult as clinically indicated   4- CAD -no active CP  -trop neg x 1 -EKG pending -cont home regimen -follow  5-DM -SSI  -A1C -lantus -tele bed - FEN/GI: heart healthy-carb modified Prophylaxis: sub q heparin  Disposition: pending further evaluation  Code Status:Full Code    Patient Active Problem List   Diagnosis Date Noted  . Severe sepsis 05/26/2014  . Elevated lactic acid level 05/26/2014  . Hypokalemia 05/26/2014  . HCAP (healthcare-associated pneumonia) 05/25/2014  . Sepsis 05/25/2014  . Acute gout   . Encephalopathy acute 05/06/2014  . UTI (lower urinary tract infection) 05/06/2014  . Acute renal insufficiency 05/06/2014  . Dehydration 05/06/2014  . FTT (failure to thrive) in adult 05/06/2014  . Gout flare 05/06/2014  . Acute respiratory failure with hypoxia 05/06/2014  . Pyrexia   . Renal insufficiency   . Diabetes mellitus due to underlying condition without complications   . COPD exacerbation 04/11/2014  . Pain in the chest   . Dyspnea 04/10/2014  . Chest pain 04/10/2014  . Chronic cough 04/10/2014  . Essential hypertension 04/10/2014  . Carotid artery occlusion 04/10/2014  . CVA (cerebral vascular accident) 04/10/2014  . Acute bronchospasm 04/10/2014  . Anemia 04/10/2014  . Dementia with behavioral disturbance 11/15/2013  . Cough 11/15/2013  . Benign paroxysmal positional vertigo 01/30/2013  . Adjustment  reaction of adult life 01/30/2013  . Need for prophylactic vaccination and inoculation against influenza 12/09/2012  . Calcaneal  fracture 09/09/2012  . Seizure 05/14/2011  . Hypotension, unspecified 05/14/2011  . Seizures 05/14/2011  . Preop cardiovascular exam 03/17/2011  . Diabetes mellitus   . CAD (coronary artery disease) of artery bypass graft   . Hyperlipidemia   . Hypertension   . Peripheral vascular disease   . Occlusion and stenosis of carotid artery without mention of cerebral infarction 02/11/2011   Past Medical History: Past Medical History  Diagnosis Date  . Diabetes mellitus type 2   . GERD (gastroesophageal reflux disease)   . Hyperlipidemia     takes Niacin nightly  . DJD (degenerative joint disease)   . Hypertension     takes Metoprolol and Ramipril daily  . Dizziness   . Gout     hx of--6474yrs ago  . Bruises easily     pt is on Plavix and ASA  . Shortness of breath     wakes pt up during night with occ shortness of breathe;states it happens about once a month  . Impaired hearing   . Urinary frequency   . UTI (lower urinary tract infection)     hx of--03/2010  . Anemia     takes Glyburide bid  . CAD (coronary artery disease) of artery bypass graft   . Pneumonia     "at least twice that I know of"  . Seizures 05/14/11    "first time was today"  . CVA (cerebral vascular accident) ~ 2009    right arm/leg occ hurts  . Carotid artery occlusion 2013    s/p bilateral endarterectomy  . Chronic headache   . Chronic neck pain   . Dementia with behavioral disturbance   . Dyspnea     chronic, daily    Past Surgical History: Past Surgical History  Procedure Laterality Date  . Appendectomy  1968  . Colonoscopy    . Endarterectomy  04/09/2011    Procedure: ENDARTERECTOMY CAROTID;  Surgeon: Chuck Hinthristopher S Dickson, MD;  [facility];  Service: Vascular;  Laterality: Left;  Left Carotid endarterectomy With Dacron Patch Angioplasty  . Cardiac catheterization  1998    Dr. Antoine PocheHochrein  . Endarterectomy  05/08/2011    Procedure: ENDARTERECTOMY CAROTID;  Surgeon: Chuck Hinthristopher S Dickson, MD;   [facility];  Service: Vascular;  Laterality: Right;  with [place] Patch Angioplasty  . Cataract extraction w/ intraocular lens  implant, bilateral  2012  . Coronary artery bypass graft  1998    LIMA to LAD, SVG to diagonal, SVG to PDA, SVG to posterior lateral.   . Eye surgery  1953    right ; "piece of metal removed"  . Eye surgery  2012    Cataract- Bilateral    Social History: History   Social History  . Marital Status: Married    Spouse Name: N/A  . Number of Children: N/A  . Years of Education: N/A   Occupational History  . retired     [employer]   Social History Main Topics  . Smoking status: Former Smoker -- 1.00 packs/day for 20 years    Types: Cigarettes    Quit date: 03/02/1974  . Smokeless tobacco: Never Used  . Alcohol Use: No     Comment: quit in 1981  . Drug Use: No  . Sexual Activity: No   Other Topics Concern  . None   Social History Narrative  Family History: Family History  Problem Relation Age of Onset  . Diabetes Sister   . Cancer Brother   . Diabetes Brother   . Anesthesia problems Neg Hx   . Hypotension Neg Hx   . Malignant hyperthermia Neg Hx   . Pseudochol deficiency Neg Hx   . Hearing loss Father     Allergies: Allergies  Allergen Reactions  . Fexofenadine Hcl Other (See Comments)    Heart attack symptoms  . Flonase [Fluticasone Propionate] Other (See Comments)    Heart attack symptoms  . Nasal Spray Other (See Comments)    Heart attack symptoms   . Nasal Spray     CAN NOT TOLERATE ANY NASAL SPRAYS!!!!!  Causes heart attack symptoms    Current Facility-Administered Medications  Medication Dose Route Frequency Provider Last Rate Last Dose  . 0.9 %  sodium chloride infusion  1,000 mL Intravenous Continuous Dione Booze, MD   Stopped at 05/29/14 4696  . aspirin EC tablet 81 mg  81 mg Oral BID Floydene Flock, MD      . ceFEPIme (MAXIPIME) 1 g in dextrose 5 % 50 mL IVPB  1 g Intravenous 3 times per day Floydene Flock,  MD      . ceFEPIme (MAXIPIME) 2 g in dextrose 5 % 50 mL IVPB  2 g Intravenous Once Mindy J Holcombe, RPH 100 mL/hr at 05/29/14 0133 2 g at 05/29/14 0133  . clopidogrel (PLAVIX) tablet 75 mg  75 mg Oral Daily Floydene Flock, MD      . heparin injection 5,000 Units  5,000 Units Subcutaneous 3 times per day Floydene Flock, MD      . insulin aspart (novoLOG) injection 0-9 Units  0-9 Units Subcutaneous 6 times per day Floydene Flock, MD      . insulin glargine (LANTUS) injection 12 Units  12 Units Subcutaneous QHS Floydene Flock, MD      . ipratropium-albuterol (DUONEB) 0.5-2.5 (3) MG/3ML nebulizer solution 3 mL  3 mL Nebulization Q4H Floydene Flock, MD      . ipratropium-albuterol (DUONEB) 0.5-2.5 (3) MG/3ML nebulizer solution 3 mL  3 mL Nebulization Q2H PRN Floydene Flock, MD      . LORazepam (ATIVAN) tablet 0.5-1 mg  0.5-1 mg Oral BID PRN Floydene Flock, MD      . memantine [facility]) tablet 10 mg  10 mg Oral BID Floydene Flock, MD      . methylPREDNISolone sodium succinate (SOLU-MEDROL) 125 mg/2 mL injection 125 mg  125 mg Intravenous Q12H Floydene Flock, MD      . metoprolol tartrate (LOPRESSOR) tablet 25 mg  25 mg Oral BID Floydene Flock, MD      . pantoprazole (PROTONIX) EC tablet 40 mg  40 mg Oral Daily Floydene Flock, MD      . QUEtiapine (SEROQUEL) tablet 50 mg  50 mg Oral BID Floydene Flock, MD      . simvastatin (ZOCOR) tablet 20 mg  20 mg Oral q1800 Floydene Flock, MD       Current Outpatient Prescriptions  Medication Sig Dispense Refill  . albuterol (PROVENTIL) (2.5 MG/3ML) 0.083% nebulizer solution Take 3 mLs (2.5 mg total) by nebulization every 6 (six) hours as needed for wheezing or shortness of breath. 75 mL 12  . aspirin EC 81 MG EC tablet Take 1 tablet (81 mg total) by mouth 2 (two) times daily.    . clopidogrel (PLAVIX) 75 MG  tablet TAKE 1 TABLET DAILY 90 tablet 2  . furosemide (LASIX) 20 MG tablet Take 1 tablet (20 mg total) by mouth daily as needed for fluid or  edema. 30 tablet 0  . insulin glargine (LANTUS) 100 UNIT/ML injection Inject 0.12 mLs (12 Units total) into the skin at bedtime. 10 mL 11  . iron polysaccharides (NIFEREX) 150 MG capsule Take 1 capsule (150 mg total) by mouth 2 (two) times daily. 60 capsule 0  . levofloxacin (LEVAQUIN) 750 MG tablet Take 1 tablet (750 mg total) by mouth daily. 4 tablet 0  . LORazepam (ATIVAN) 0.5 MG tablet 1-2 tablets twice daily as needed (Patient taking differently: Take 0.5-1 mg by mouth 2 (two) times daily as needed for anxiety. ) 120 tablet 0  . memantine (NAMENDA) 10 MG tablet Take 10 mg by mouth 2 (two) times daily.    . metoprolol (LOPRESSOR) 50 MG tablet Take 0.5 tablets (25 mg total) by mouth 2 (two) times daily. 60 tablet 1  . pantoprazole (PROTONIX) 40 MG tablet Take 1 tablet (40 mg total) by mouth daily. 30 tablet 0  . QUEtiapine (SEROQUEL) 50 MG tablet TAKE 1 TABLET BY MOUTH IN THE MORNING AND 2 TABLETS AT BEDTIME 90 tablet 2  . simvastatin (ZOCOR) 20 MG tablet Take 1 tablet (20 mg total) by mouth daily at 6 PM. 90 tablet 3   Review Of Systems: 12 point ROS negative except as noted above in HPI.  Physical Exam: Filed Vitals:   05/29/14 0134  BP: 156/77  Pulse: 105  Temp: 98.4 F (36.9 C)  Resp: 18    General: alert and cooperative HEENT: PERRLA and extra ocular movement intact Heart: S1, S2 normal, no murmur, rub or gallop, regular rate and rhythm Lungs: mild increased WOB, + wheezing and rales  Abdomen: abdomen is soft without significant tenderness, masses, organomegaly or guarding Extremities: extremities normal, atraumatic, no cyanosis or edema Skin:no rashes Neurology: normal without focal findings   Labs and Imaging: Lab Results  Component Value Date/Time   NA 137 05/28/2014 11:30 PM   NA 139 04/03/2014 12:00 PM   K 3.0* 05/28/2014 11:30 PM   CL 103 05/28/2014 11:30 PM   CO2 24 05/28/2014 11:30 PM   BUN 16 05/28/2014 11:30 PM   BUN 15 04/03/2014 12:00 PM   CREATININE 1.28  05/28/2014 11:30 PM   CREATININE 1.16 08/09/2012 09:54 AM   GLUCOSE 140* 05/28/2014 11:30 PM   GLUCOSE 329* 04/03/2014 12:00 PM   Lab Results  Component Value Date   WBC 8.6 05/28/2014   HGB 10.5* 05/28/2014   HCT 31.6* 05/28/2014   MCV 97.2 05/28/2014   PLT 159 05/28/2014   Urinalysis    Component Value Date/Time   COLORURINE YELLOW 05/28/2014 2245   APPEARANCEUR CLEAR 05/28/2014 2245   LABSPEC 1.025 05/28/2014 2245   PHURINE 6.0 05/28/2014 2245   GLUCOSEU NEGATIVE 05/28/2014 2245   HGBUR SMALL* 05/28/2014 2245   BILIRUBINUR NEGATIVE 05/28/2014 2245   BILIRUBINUR neg 05/11/2014 1119   KETONESUR NEGATIVE 05/28/2014 2245   PROTEINUR NEGATIVE 05/28/2014 2245   PROTEINUR neg 05/11/2014 1119   UROBILINOGEN 0.2 05/28/2014 2245   UROBILINOGEN negative 05/11/2014 1119   NITRITE NEGATIVE 05/28/2014 2245   NITRITE neg 05/11/2014 1119   LEUKOCYTESUR NEGATIVE 05/28/2014 2245      Dg Chest 2 View  05/29/2014   CLINICAL DATA:  Shortness of breath cough for 5 months.  EXAM: CHEST  2 VIEW  COMPARISON:  Portable AP view 05/25/2014  FINDINGS: Patient is post median sternotomy with multiple mediastinal clips and intact sternal wires. Improved aeration at the left lung base. Pulmonary vasculature is normal. No consolidation, pleural effusion, or pneumothorax. No acute osseous abnormalities are seen.  IMPRESSION: No acute pulmonary process. Improved aeration at the left lung base.   Electronically Signed   By: Rubye Oaks M.D.   On: 05/29/2014 00:21           Doree Albee MD  Pager: [phone]

## 2014-05-29 NOTE — Telephone Encounter (Signed)
-----   Message from Frederica KusterStephen M Miller, MD sent at 05/29/2014  8:02 AM EDT ----- Urine is negative for Legionella

## 2014-05-29 NOTE — Care Management Utilization Note (Signed)
UR completed 

## 2014-05-29 NOTE — Progress Notes (Addendum)
[facility] PROGRESS NOTE  [name] COLARUSSO ZOX:[id number] DOB: [birth year] DOA: 05/28/2014 PCP: Frederica Kuster, MD  Assessment/Plan: Acute Hypoxemic Respiratory Failure/HCAP -Continue vanc/cefepime. -Cx data pending. -Continue steroids given active wheezing. -on [place] oxygen. Wean as tolerated.  Hematuria -Is on ASA/plavix. -There is a question of trauma during I and O cath, but patient reports he urinated  Blood prior to I and O cath done in ED. -No h/o nephrolithiasis. -Check renal US. -Patient c/o significant pain/dysuria. Will add pyridium. -Will request GU consultation.  Sepsis -2/2 HCAP. -Parameters improved. -See above for details.  CAD -No active CP.  DM -Well controlled. -Continue SSI.  Code Status: Full Code Family Communication: Patient only  Disposition Plan: Transfer to floor. Request GU consult.   Consultants:  GU   Antibiotics:  Vanc  Cefepime   Subjective: C/o significant dysuria.  Objective: Filed Vitals:   05/29/14 0649 05/29/14 0700 05/29/14 0733 05/29/14 0800  BP:  118/47  203/95  Pulse:  79  101  Temp:   96.6 F (35.9 C)   TempSrc:   Axillary   Resp:  16  31  Height:      Weight:      SpO2: 100% 98%  100%    Intake/Output Summary (Last 24 hours) at 05/29/14 0924 Last data filed at 05/29/14 0819  Gross per 24 hour  Intake   3620 ml  Output   1575 ml  Net   2045 ml   Filed Weights   05/28/14 2221 05/29/14 0227 05/29/14 0500  Weight: 99.791 kg (220 lb) 100 kg (220 lb 7.4 oz) 99.8 kg (220 lb 0.3 oz)    Exam:   General:  AA Ox3  Cardiovascular: RRR  Respiratory: Bilateral exp wheezing.  Abdomen: S/NT/ND/+BS  Extremities: no C/C/E   Neurologic:  Non-focal  Data Reviewed: Basic Metabolic Panel:  Recent Labs Lab 05/25/14 0100 05/25/14 0447 05/27/14 1126 05/28/14 2330 05/29/14 0216  NA 138 139 137 137 138  K 3.8 3.0* 3.8 3.0* 3.9  CL 102 105 100 103 107  CO2 GLUCOSE  235* 318* 138* 140* 265*  BUN CREATININE 1.34 1.27 1.15 1.28 1.16  CALCIUM 8.2* 7.7* 8.5 7.5* 7.5*   Liver Function Tests:  Recent Labs Lab 05/25/14 0100 05/29/14 0216  AST 20 27  ALT 15 19  ALKPHOS 49 43  BILITOT 0.4 0.6  PROT 6.8 6.6  ALBUMIN 3.3* 3.3*   No results for input(s): LIPASE, AMYLASE in the last 168 hours. No results for input(s): AMMONIA in the last 168 hours. CBC:  Recent Labs Lab 05/25/14 0100 05/25/14 0447 05/27/14 1126 05/28/14 2330 05/29/14 0216  WBC 6.6 7.0 7.3 8.6 9.1  NEUTROABS 4.9  --   --  7.4 8.2*  HGB 11.9* 10.5* 12.5* 10.5* 11.0*  HCT 36.8* 32.8* 38.2* 31.6* 33.7*  MCV 98.9 98.5 97.2 97.2 96.6  PLT 209 193 208 159 167   Cardiac Enzymes:  Recent Labs Lab 05/25/14 0100 05/28/14 2330  TROPONINI <0.03 <0.03   BNP (last 3 results)  Recent Labs  04/10/14 2108 05/28/14 2329  BNP 21.0 32.0    ProBNP (last 3 results) No results for input(s): PROBNP in the last 8760 hours.  CBG:  Recent Labs Lab 05/26/14 1608 05/26/14 2012 05/27/14 0125 05/27/14 0823 05/27/14 1147  GLUCAP 128* 95 90 100* 138*    Recent Results (from the past 240 hour(s))  Urine culture  Status: None   Collection Time: 05/25/14  2:20 AM  Result Value Ref Range Status   Specimen Description URINE, CATHETERIZED  Final   Special Requests NONE  Final   Colony Count NO GROWTH Performed at [facility]   Final   Culture NO GROWTH Performed at [facility]   Final   Report Status 05/26/2014 FINAL  Final  Culture, blood (routine x 2) Call MD if unable to obtain prior to antibiotics being given     Status: None (Preliminary result)   Collection Time: 05/25/14  4:47 AM  Result Value Ref Range Status   Specimen Description BLOOD LEFT HAND  Final   Special Requests BOTTLES DRAWN AEROBIC AND ANAEROBIC 6CC  Final   Culture NO GROWTH 3 DAYS  Final   Report Status PENDING  Incomplete  Culture, blood (routine x 2) Call MD if  unable to obtain prior to antibiotics being given     Status: None (Preliminary result)   Collection Time: 05/25/14  4:59 AM  Result Value Ref Range Status   Specimen Description BLOOD RIGHT HAND  Final   Special Requests BOTTLES DRAWN AEROBIC AND ANAEROBIC 6CC  Final   Culture NO GROWTH 3 DAYS  Final   Report Status PENDING  Incomplete  MRSA PCR Screening     Status: None   Collection Time: 05/25/14  1:30 PM  Result Value Ref Range Status   MRSA by PCR NEGATIVE NEGATIVE Final  Culture, blood (routine x 2)     Status: None (Preliminary result)   Collection Time: 05/28/14 11:30 PM  Result Value Ref Range Status   Specimen Description Blood  Final   Special Requests BOTTLES DRAWN AEROBIC AND ANAEROBIC 10CC EACH  Final   Culture PENDING  Incomplete   Report Status PENDING  Incomplete  Culture, blood (routine x 2)     Status: None (Preliminary result)   Collection Time: 05/28/14 11:30 PM  Result Value Ref Range Status   Specimen Description BLOOD RIGHT HAND  Final   Special Requests   Final    BOTTLES DRAWN AEROBIC AND ANAEROBIC AEB 6CC ANA 4CC   Culture PENDING  Incomplete   Report Status PENDING  Incomplete     Studies: Dg Chest 2 View  05/29/2014   CLINICAL DATA:  Shortness of breath cough for 5 months.  EXAM: CHEST  2 VIEW  COMPARISON:  Portable AP view 05/25/2014  FINDINGS: Patient is post median sternotomy with multiple mediastinal clips and intact sternal wires. Improved aeration at the left lung base. Pulmonary vasculature is normal. No consolidation, pleural effusion, or pneumothorax. No acute osseous abnormalities are seen.  IMPRESSION: No acute pulmonary process. Improved aeration at the left lung base.   Electronically Signed   By: Rubye Oaks M.D.   On: 05/29/2014 00:21    Scheduled Meds: . aspirin EC  81 mg Oral BID  . ceFEPime (MAXIPIME) IV  1 g Intravenous Q8H  . clopidogrel  75 mg Oral Daily  . heparin  5,000 Units Subcutaneous 3 times per day  . insulin aspart   0-9 Units Subcutaneous 6 times per day  . insulin glargine  12 Units Subcutaneous QHS  . ipratropium-albuterol  3 mL Nebulization Q6H  . memantine  10 mg Oral BID  . methylPREDNISolone (SOLU-MEDROL) injection  125 mg Intravenous Q12H  . metoprolol  25 mg Oral BID  . pantoprazole  40 mg Oral Daily  . phenazopyridine  100 mg Oral TID WC  . QUEtiapine  50 mg Oral BID  . simvastatin  20 mg Oral q1800  . vancomycin  750 mg Intravenous Q12H   Continuous Infusions: . sodium chloride Stopped (05/29/14 0114)    Active Problems:   HCAP (healthcare-associated pneumonia)   Hematuria    Time spent: 35 minutes. Greater than 50% of this time was spent in direct contact with the patient coordinating care.    Chaya JanHERNANDEZ ACOSTA,Lisanne Ponce  [facility] Pager [phone]  If 7PM-7AM, please contact night-coverage at [email or web address], password [email or web address] 05/29/2014, 9:24 AM  LOS: 0 days       Addendum: Renal US shows possible bladder mass and the likely cause of hematuria. Dr. Annabell HowellsWrenn to see patient today. ASA/plavix have been discontinued.  Peggye PittEstela Hernandez, MD [facility] Pager: [phone]

## 2014-05-29 NOTE — Progress Notes (Signed)
Attempted to place 56F foley catheter x2, unsuccessful.  Catheter coiled in penis and would not advance.  Dr. Ardyth HarpsHernandez notified, as well as Dr. Wilson SingerWren.  Dr. Wilson SingerWren on the unit to see patient.  Will continue to monitor patient.

## 2014-05-29 NOTE — Progress Notes (Signed)
PT IS TRANSFERRING TO ROOM 312 AS MED/SURG PT.PT ON O2 AT 3L/MIN VIA [place]. O2 SAT 100%the patient CONTINUES TO EXPELL NICKEL SIZE CLOTTS FROM UREATHERAL OPENING OF PENIS.CONTINUES TO COMPLAIN OF PAIN W/ URINATION THAT HE IS UNABLE TO CONTROL.. VS STABLE. IV SITES X2 PATENT.TRANSFER REPORT CALLED TO MICHELE RN ON 300. CBG 197.

## 2014-05-30 DIAGNOSIS — J9601 Acute respiratory failure with hypoxia: Secondary | ICD-10-CM

## 2014-05-30 DIAGNOSIS — R338 Other retention of urine: Secondary | ICD-10-CM

## 2014-05-30 LAB — HEMOGLOBIN A1C
Hgb A1c MFr Bld: 7.9 % — ABNORMAL HIGH (ref 4.8–5.6)
MEAN PLASMA GLUCOSE: 180 mg/dL

## 2014-05-30 LAB — COMPREHENSIVE METABOLIC PANEL
ALBUMIN: 2.9 g/dL — AB (ref 3.5–5.2)
ALT: 18 U/L (ref 0–53)
AST: 25 U/L (ref 0–37)
Alkaline Phosphatase: 37 U/L — ABNORMAL LOW (ref 39–117)
Anion gap: 8 (ref 5–15)
BUN: 20 mg/dL (ref 6–23)
CO2: 28 mmol/L (ref 19–32)
Calcium: 8.4 mg/dL (ref 8.4–10.5)
Chloride: 106 mmol/L (ref 96–112)
Creatinine, Ser: 1.2 mg/dL (ref 0.50–1.35)
GFR calc non Af Amer: 55 mL/min — ABNORMAL LOW (ref >90)
GFR, EST AFRICAN AMERICAN: 64 mL/min — AB (ref >90)
Glucose, Bld: 218 mg/dL — ABNORMAL HIGH (ref 70–99)
POTASSIUM: 4 mmol/L (ref 3.5–5.1)
Sodium: 142 mmol/L (ref 135–145)
Total Bilirubin: 0.3 mg/dL (ref 0.3–1.2)
Total Protein: 6.1 g/dL (ref 6.0–8.3)

## 2014-05-30 LAB — URINE CULTURE
COLONY COUNT: NO GROWTH
CULTURE: NO GROWTH

## 2014-05-30 LAB — CBC WITH DIFFERENTIAL/PLATELET
BASOS ABS: 0 10*3/uL (ref 0.0–0.1)
BASOS PCT: 0 % (ref 0–1)
EOS PCT: 0 % (ref 0–5)
Eosinophils Absolute: 0 10*3/uL (ref 0.0–0.7)
HCT: 31.5 % — ABNORMAL LOW (ref 39.0–52.0)
Hemoglobin: 10 g/dL — ABNORMAL LOW (ref 13.0–17.0)
Lymphocytes Relative: 10 % — ABNORMAL LOW (ref 12–46)
Lymphs Abs: 1 10*3/uL (ref 0.7–4.0)
MCH: 31.2 pg (ref 26.0–34.0)
MCHC: 31.7 g/dL (ref 30.0–36.0)
MCV: 98.1 fL (ref 78.0–100.0)
Monocytes Absolute: 0.3 10*3/uL (ref 0.1–1.0)
Monocytes Relative: 3 % (ref 3–12)
Neutro Abs: 9.6 10*3/uL — ABNORMAL HIGH (ref 1.7–7.7)
Neutrophils Relative %: 87 % — ABNORMAL HIGH (ref 43–77)
PLATELETS: 175 10*3/uL (ref 150–400)
RBC: 3.21 MIL/uL — ABNORMAL LOW (ref 4.22–5.81)
RDW: 14.3 % (ref 11.5–15.5)
WBC: 11 10*3/uL — ABNORMAL HIGH (ref 4.0–10.5)

## 2014-05-30 LAB — GLUCOSE, CAPILLARY
GLUCOSE-CAPILLARY: 185 mg/dL — AB (ref 70–99)
GLUCOSE-CAPILLARY: 209 mg/dL — AB (ref 70–99)
Glucose-Capillary: 204 mg/dL — ABNORMAL HIGH (ref 70–99)
Glucose-Capillary: 221 mg/dL — ABNORMAL HIGH (ref 70–99)
Glucose-Capillary: 218 mg/dL — ABNORMAL HIGH (ref 70–99)
Glucose-Capillary: 211 mg/dL — ABNORMAL HIGH (ref 70–99)

## 2014-05-30 LAB — HIV ANTIBODY (ROUTINE TESTING W REFLEX): HIV Screen 4th Generation wRfx: NONREACTIVE

## 2014-05-30 MED ORDER — DIPHENHYDRAMINE HCL 25 MG PO CAPS: 25.0000 mg | ORAL_CAPSULE | Freq: Every evening | ORAL | Status: DC | PRN

## 2014-05-30 MED ORDER — METHYLPREDNISOLONE SODIUM SUCC 40 MG IJ SOLR
40.0000 mg | Freq: Two times a day (BID) | INTRAMUSCULAR | Status: DC
Start: 2014-05-31 — End: 2014-05-31
  Administered 2014-05-31 (×2): 40 mg via INTRAVENOUS
  Filled 2014-05-30 (×2): qty 1

## 2014-05-30 MED ORDER — ACETAMINOPHEN 325 MG PO TABS: 650.0000 mg | ORAL_TABLET | Freq: Four times a day (QID) | ORAL | Status: DC | PRN

## 2014-05-30 MED ORDER — ZOLPIDEM TARTRATE 5 MG PO TABS
5.0000 mg | ORAL_TABLET | Freq: Every evening | ORAL | Status: DC | PRN
  Administered 2014-05-30: 5 mg via ORAL
  Filled 2014-05-30: qty 1

## 2014-05-30 NOTE — Progress Notes (Addendum)
PROGRESS NOTE  [name] JarvisSylvester H [name] [id number] DOB: 08/18/1931 DOA: 05/28/2014 PCP: [name]  Summary: 81 year old man hospitalized for severe sepsis and pneumonia, discharged 3/27 presented 3/28 with shortness of breath and was admitted for sepsis, suspected HCAP, acute respiratory failure,  Assessment/Plan: 1. Sepsis appears clinically resolved, hemodynamics stable. 2. Suspected HCAP based on presentation. Chest x-ray without obvious infiltrate. Afebrile, vitals stable. 3. Acute hypoxic respiratory failure, suspect resolving. 4. Acute encephalopathy. Resolved. 5. Urinary retention, hematuria, bladder mass, history of BPH. Complicated by aspirin and Plavix. 6. Insulin-dependent diabetes mellitus, stable 7. Coronary artery disease, status post CABG 1990s, CEE 2013 8. COPD. Appears stable.  9. Seizure disorder 10. Dementia, stable.   Improving rapidly, afebrile with stable vitals.  Wean oxygen, continue empiric abx given presentation and recent hospitalization  Keep foley, follow CBC.  Urology recommends continuing Foley catheter, hold aspirin and Plavix, Avodart started, the Foley catheter in and follow-up in a week for voiding trial.  Appears to be improving, possibly home in 48 hours  Code Status: full code DVT prophylaxis: SCDs Family Communication: none Disposition Plan: home with PT  [name] Sacksaniel Aniel Hubble, [name]  [facility]  Pager [phone] If 7PM-7AM, please contact night-coverage at [email or web address], password [email or web address] 05/30/2014, 2:19 PM  LOS: 1 day   Consultants:  Urology   Procedures:  Foley catheter placement  Antibiotics:  Cefepime 3/28 >>  Vancomycin 3/28 >>  Vancomycin  HPI/Subjective: Feeling better, no pain, no n/v. Eating.  Objective: Filed Vitals:   05/30/14 0134 05/30/14 0516 05/30/14 0707 05/30/14 1416  BP:  128/72    Pulse:  76    Temp:  97.9 F (36.6 C)    TempSrc:  Oral    Resp:  18    Height:      Weight:      SpO2:  95% 100% 100% 98%    Intake/Output Summary (Last 24 hours) at 05/30/14 1419 Last data filed at 05/30/14 1118  Gross per 24 hour  Intake   1440 ml  Output   4900 ml  Net  -3460 ml     Filed Weights   05/28/14 2221 05/29/14 0227 05/29/14 0500  Weight: 99.791 kg (220 lb) 100 kg (220 lb 7.4 oz) 99.8 kg (220 lb 0.3 oz)    Exam:     Afebrile greater than 24 hours. Vital signs stable. SPO2 98% on 3 L. General: Appears calm and comfortable. Sitting in chairs. Cardiovascular: RRR, no m/r/g. No LE edema. Respiratory: CTA bilaterally, no w/r/r. Normal respiratory effort. Abdomen: soft, ntnd Psychiatric: grossly normal mood and affect, speech fluent and appropriate  Data Reviewed:  UOP 4250  CBG stable.  Complete metabolic panel unremarkable  Troponin negative  Lactic acid 2.7  Hemoglobin stable 10.0. Minimal leukocytosis on steroids, 11.0  Pending data:  Blood cultures  Urine culture  Scheduled Meds: . ceFEPime (MAXIPIME) IV  1 g Intravenous Q8H  . finasteride  5 mg Oral Daily  . insulin aspart  0-9 Units Subcutaneous 6 times per day  . insulin glargine  12 Units Subcutaneous QHS  . ipratropium-albuterol  3 mL Nebulization Q6H  . memantine  10 mg Oral BID  . methylPREDNISolone (SOLU-MEDROL) injection  125 mg Intravenous Q12H  . metoprolol  25 mg Oral BID  . pantoprazole  40 mg Oral Daily  . phenazopyridine  100 mg Oral TID WC  . QUEtiapine  50 mg Oral BID  . simvastatin  20 mg Oral q1800  . vancomycin  750 mg Intravenous Q12H  Continuous Infusions: . sodium chloride Stopped (05/29/14 0114)    Principal Problem:   HCAP (healthcare-associated pneumonia) Active Problems:   Diabetes mellitus   Acute respiratory failure with hypoxia   Sepsis   Hematuria   Acute urinary retention   Time spent 25 minutes

## 2014-05-30 NOTE — Progress Notes (Signed)
Patient upset and yelling. Patient states no one is providing him any information regarding his foley catheter. Notified Dr. Irene LimboGoodrich via telephone and MD spoke with patient's son.

## 2014-05-30 NOTE — Progress Notes (Signed)
Subjective:  1 - Gross Hematuria - new gross hematuria with clots this admission likely from prostatic fossa bleeding in setting of anticoagualation. Anticoagulation now held and on continuous bladder irrigation with 65F foley placed 3/29.   2 - Massive Prostatic Hypertrophy - massive prostate by exam / imaging this admission (9x6x5cm) with intravesical protrusion. Started on finasteride this admission.   Today [name] is seen in f/u above. Hgb stable. Urine still pink but clearing and no clots off irrigation.   Objective: Vital signs in last 24 hours: Temp:  [97.9 F (36.6 C)-98.1 F (36.7 C)] 98.1 F (36.7 C) (03/30 1516) Pulse Rate:  [76-101] 101 (03/30 1516) Resp:  [18] 18 (03/30 1516) BP: (128-159)/(62-72) 159/62 mmHg (03/30 1516) SpO2:  [95 %-100 %] 99 % (03/30 1516) Last BM Date: 05/28/14  Intake/Output from previous day: 03/29 0701 - 03/30 0700 In: 1760 [P.O.:1560; IV Piggyback:200] Out: 4250 [Urine:4250] Intake/Output this shift: Total I/O In: 480 [P.O.:480] Out: 2200 [Urine:2200]  General appearance: alert, cooperative and appears stated age Nose: Nares normal. Septum midline. Mucosa normal. No drainage or sinus tenderness. Throat: lips, mucosa, and tongue normal; teeth and gums normal Neck: supple, symmetrical, trachea midline Back: symmetric, no curvature. ROM normal. No CVA tenderness. Resp: non-labored on room air Cardio: Nl rate GI: soft, non-tender; bowel sounds normal; no masses,  no organomegaly Male genitalia: normal, foley c/d/i with pink urine off irigation. Minimal debris in catheter. No clots Extremities: extremities normal, atraumatic, no cyanosis or edema Pulses: 2+ and symmetric Skin: Skin color, texture, turgor normal. No rashes or lesions Neurologic: Grossly normal  Lab Results:   Recent Labs  05/29/14 0216 05/30/14 0601  WBC 9.1 11.0*  HGB 11.0* 10.0*  HCT 33.7* 31.5*  PLT 167 175   BMET  Recent Labs  05/29/14 0216  05/30/14 0601  NA 138 142  K 3.9 4.0  CL 107 106  CO2 20 28  GLUCOSE 265* 218*  BUN 15 20  CREATININE 1.16 1.20  CALCIUM 7.5* 8.4   PT/INR  Recent Labs  05/29/14 0216  LABPROT 14.3  INR 1.10   ABG No results for input(s): PHART, HCO3 in the last 72 hours.  Invalid input(s): PCO2, PO2  Studies/Results: Dg Chest 2 View  05/29/2014   CLINICAL DATA:  Shortness of breath cough for 5 months.  EXAM: CHEST  2 VIEW  COMPARISON:  Portable AP view 05/25/2014  FINDINGS: Patient is post median sternotomy with multiple mediastinal clips and intact sternal wires. Improved aeration at the left lung base. Pulmonary vasculature is normal. No consolidation, pleural effusion, or pneumothorax. No acute osseous abnormalities are seen.  IMPRESSION: No acute pulmonary process. Improved aeration at the left lung base.   Electronically Signed   By: Rubye OaksMelanie  Ehinger M.D.   On: 05/29/2014 00:21   [place] Renal  05/29/2014   CLINICAL DATA:  Gross hematuria  EXAM: RENAL/URINARY TRACT ULTRASOUND COMPLETE  COMPARISON:  CT abdomen and pelvis May 25, 2014  FINDINGS: Right Kidney:  Length: 10.6 cm. Echogenicity and renal cortical thickness are within normal limits. No mass, perinephric fluid, or hydronephrosis visualized. No sonographically demonstrable calculus or ureterectasis.  Left Kidney:  Length: 11.8 cm. Echogenicity and renal cortical thickness are within normal limits. No mass, perinephric fluid, or hydronephrosis visualized. No sonographically demonstrable calculus or ureterectasis.  Bladder:  There is an apparent mass arising from the wall of the inferior urinary bladder measuring 8.1 x 5.2 x 0.9 cm.  IMPRESSION: Apparent mass arising from the inferior urinary  bladder. Advise cystoscopy to further assess. Kidneys appear normal bilaterally.  These results will be called to the ordering clinician or representative by the Radiologist Assistant, and communication documented in the PACS or zVision Dashboard.    Electronically Signed   By: Bretta Bang III M.D.   On: 05/29/2014 10:45    Anti-infectives: Anti-infectives    Start     Dose/Rate Route Frequency Ordered Stop   05/29/14 1200  vancomycin (VANCOCIN) IVPB 750 mg/150 ml premix     750 mg 150 mL/hr over 60 Minutes Intravenous Every 12 hours 05/29/14 0800     05/29/14 1000  ceFEPIme (MAXIPIME) 1 g in dextrose 5 % 50 mL IVPB     1 g 100 mL/hr over 30 Minutes Intravenous Every 8 hours 05/29/14 0229 06/06/14 0959   05/29/14 0145  ceFEPIme (MAXIPIME) 1 g in dextrose 5 % 50 mL IVPB  Status:  Discontinued     1 g 100 mL/hr over 30 Minutes Intravenous 3 times per day 05/29/14 0138 05/29/14 0227   05/29/14 0100  ceFEPIme (MAXIPIME) 2 g in dextrose 5 % 50 mL IVPB     2 g 100 mL/hr over 30 Minutes Intravenous  Once 05/29/14 0044 05/29/14 0204   05/29/14 0015  vancomycin (VANCOCIN) IVPB 1000 mg/200 mL premix     1,000 mg 200 mL/hr over 60 Minutes Intravenous  Once 05/29/14 0012 05/29/14 0133      Assessment/Plan:  1 - Gross Hematuria - likely from BPH in setting of UTI. Now improving holding anticoagulation.   2 - Massive Prostatic Hypertrophy - conttinue finasteride + foley  now and at discharge. Will need office trial of void 1-2 weeks following DC in our office in [place] or [place]. This likely represents the "mass"referred to on above ultrasound.     [facility], Eulalie Speights 05/30/2014

## 2014-05-30 NOTE — Progress Notes (Signed)
Inpatient Diabetes Program Recommendations  AACE/ADA: New Consensus Statement on Inpatient Glycemic Control (2013)  Target Ranges:  Prepandial:   less than 140 mg/dL      Peak postprandial:   less than 180 mg/dL (1-2 hours)      Critically ill patients:  140 - 180 mg/dL   Results for [name] JarvisBULLINS, [name] (MRN [id number]) as of 05/30/2014 09:19  Ref. Range 05/29/2014 07:16 05/29/2014 11:29 05/29/2014 16:47 05/29/2014 20:06 05/30/2014 00:08 05/30/2014 03:59 05/30/2014 07:30  Glucose-Capillary Latest Range: 70-99 mg/dL 132278 (H) 440197 (H) 102180 (H) 267 (H) 209 (H) 204 (H) 185 (H)   Current orders for Inpatient glycemic control: Lantus 12 units QHS, Novolog 0-9 units Q4H  Inpatient Diabetes Program Recommendations Insulin - Basal: If steroids are continued, please consider increasing Lantus to 16 units QHS. Correction (SSI): Please consider increasing Novolog correction to moderate scale and if patient is eating well please consider changing frequency of CBGs and Novolog correction to ACHS. Insulin - Meal Coverage: If steroids are continued, please consider ordering Novolog 3 units TID with meals for meal coverage (in addition to Novolog correction) if patient eats at least 50% of meal.  Thanks, Orlando PennerMarie Ayomide Purdy, RN, MSN, CCRN, CDE Diabetes Coordinator Inpatient Diabetes Program [phone] (Team Pager from 8am to 5pm) [phone] ([facility] office) [phone] [facility] office)

## 2014-05-31 ENCOUNTER — Ambulatory Visit: Payer: Medicare Other | Admitting: Family Medicine

## 2014-05-31 DIAGNOSIS — R2689 Other abnormalities of gait and mobility: Secondary | ICD-10-CM

## 2014-05-31 DIAGNOSIS — I2510 Atherosclerotic heart disease of native coronary artery without angina pectoris: Secondary | ICD-10-CM

## 2014-05-31 DIAGNOSIS — N390 Urinary tract infection, site not specified: Secondary | ICD-10-CM | POA: Diagnosis not present

## 2014-05-31 DIAGNOSIS — A499 Bacterial infection, unspecified: Secondary | ICD-10-CM | POA: Diagnosis not present

## 2014-05-31 LAB — GLUCOSE, CAPILLARY
GLUCOSE-CAPILLARY: 160 mg/dL — AB (ref 70–99)
Glucose-Capillary: 158 mg/dL — ABNORMAL HIGH (ref 70–99)
Glucose-Capillary: 182 mg/dL — ABNORMAL HIGH (ref 70–99)
Glucose-Capillary: 188 mg/dL — ABNORMAL HIGH (ref 70–99)
Glucose-Capillary: 196 mg/dL — ABNORMAL HIGH (ref 70–99)
Glucose-Capillary: 238 mg/dL — ABNORMAL HIGH (ref 70–99)

## 2014-05-31 MED ORDER — INSULIN GLARGINE 100 UNIT/ML ~~LOC~~ SOLN
6.0000 [IU] | Freq: Every day | SUBCUTANEOUS | Status: DC
  Administered 2014-05-31 – 2014-06-01 (×2): 6 [IU] via SUBCUTANEOUS
  Filled 2014-05-31 (×4): qty 0.06

## 2014-05-31 MED ORDER — IPRATROPIUM-ALBUTEROL 0.5-2.5 (3) MG/3ML IN SOLN
3.0000 mL | Freq: Three times a day (TID) | RESPIRATORY_TRACT | Status: DC
  Administered 2014-05-31 – 2014-06-02 (×6): 3 mL via RESPIRATORY_TRACT
  Filled 2014-05-31 (×8): qty 3

## 2014-05-31 MED ORDER — PREDNISONE 20 MG PO TABS
40.0000 mg | ORAL_TABLET | Freq: Every day | ORAL | Status: DC
  Administered 2014-06-01: 40 mg via ORAL
  Filled 2014-05-31: qty 2

## 2014-05-31 MED ORDER — INSULIN ASPART 100 UNIT/ML ~~LOC~~ SOLN
0.0000 [IU] | Freq: Three times a day (TID) | SUBCUTANEOUS | Status: DC
  Administered 2014-05-31 – 2014-06-01 (×2): 2 [IU] via SUBCUTANEOUS
  Administered 2014-06-01 (×2): 5 [IU] via SUBCUTANEOUS
  Administered 2014-06-02: 1 [IU] via SUBCUTANEOUS
  Administered 2014-06-02: 3 [IU] via SUBCUTANEOUS

## 2014-05-31 NOTE — Progress Notes (Addendum)
PROGRESS NOTE  [name] JXB:[id number] DOB: November 27, 1931 DOA: 05/28/2014 PCP: [name], [name], MD  Summary: 81 year old man hospitalized for severe sepsis and pneumonia, discharged 3/27 presented 3/28 with shortness of breath and was admitted for sepsis, suspected HCAP, acute respiratory failure,  Assessment/Plan: 1. Sepsis. Resolved. Afebrile, hemodynamics stable. 2. Suspected HCAP based on presentation. Chest x-ray without obvious infiltrate. Clinically resolved, no hypoxia. 3. Acute hypoxic respiratory failure, resolved. 4. Acute encephalopathy. Resolved. 5. Urinary retention, hematuria, massive BPH. Complicated by aspirin and Plavix. 6. Normocytic anemia appears to be at baseline. 7. Insulin-dependent diabetes mellitus, remains stable 8. Coronary artery disease, status post CABG 1990s, CEE 2013 9. COPD. Stable. 10. Seizure disorder 11. Dementia, stable; gets agitated at times per son.   Overall doing well, plan to narrow abx and change to oral therapy 4/1.  Keep foley   Urology recommends continuing Foley catheter with outpatient f/u in 1-2 days for voiding trial. Hold aspirin and Plavix, continue Avodart.  Likely home next 48 hours  Code Status: full code DVT prophylaxis: SCDs Family Communication: discussed with son by telephone Disposition Plan: home with PT  Brendia Sacksaniel Milt Coye, MD  [facility]  Pager [phone] If 7PM-7AM, please contact night-coverage at [email or web address], password [email or web address] 05/31/2014, 2:59 PM  LOS: 2 days   Consultants:  Urology   Procedures:  Foley catheter placement  Antibiotics:  Cefepime 3/28 >>  Vancomycin 3/28 >>  HPI/Subjective: Improving, no pain, breathing fine. Hopes to go home soon.  Some confusion last night.  Objective: Filed Vitals:   05/31/14 0328 05/31/14 0618 05/31/14 0749 05/31/14 1345  BP: 159/45 150/69  119/45  Pulse: 42 78  45  Temp: 97.6 F (36.4 C) 98.6 F (37 C)  98.2 F (36.8 C)  TempSrc: Oral  Oral  Oral  Resp: 18 18  20   Height:      Weight:      SpO2: 98% 99% 96% 98%    Intake/Output Summary (Last 24 hours) at 05/31/14 1459 Last data filed at 05/31/14 1347  Gross per 24 hour  Intake    770 ml  Output   4400 ml  Net  -3630 ml     Filed Weights   05/28/14 2221 05/29/14 0227 05/29/14 0500  Weight: 99.791 kg (220 lb) 100 kg (220 lb 7.4 oz) 99.8 kg (220 lb 0.3 oz)    Exam:     Afebrile greater than 48 hours. Vital signs stable. SPO2 98% on RA. General:  Appears calm and comfortable, sitting in chair; friends from church present Cardiovascular: RRR, no m/r/g. No LE edema. Respiratory: CTA bilaterally, no w/r/r. Normal respiratory effort. Psychiatric: grossly normal mood and affect, speech fluent and appropriate  Data Reviewed:  UOP 5000  CBG stable.  Pending data:  Blood cultures  Urine culture  Scheduled Meds: . ceFEPime (MAXIPIME) IV  1 g Intravenous Q8H  . finasteride  5 mg Oral Daily  . insulin aspart  0-9 Units Subcutaneous 6 times per day  . insulin glargine  12 Units Subcutaneous QHS  . ipratropium-albuterol  3 mL Nebulization TID  . memantine  10 mg Oral BID  . methylPREDNISolone (SOLU-MEDROL) injection  40 mg Intravenous Q12H  . metoprolol  25 mg Oral BID  . pantoprazole  40 mg Oral Daily  . phenazopyridine  100 mg Oral TID WC  . QUEtiapine  50 mg Oral BID  . simvastatin  20 mg Oral q1800  . vancomycin  750 mg Intravenous Q12H   Continuous Infusions:  Principal Problem:   HCAP (healthcare-associated pneumonia) Active Problems:   Diabetes mellitus   Acute respiratory failure with hypoxia   Sepsis   Hematuria   Acute urinary retention   Time spent 20 minutes

## 2014-05-31 NOTE — Progress Notes (Signed)
Inpatient Diabetes Program Recommendations  AACE/ADA: New Consensus Statement on Inpatient Glycemic Control (2013)  Target Ranges:  Prepandial:   less than 140 mg/dL      Peak postprandial:   less than 180 mg/dL (1-2 hours)      Critically ill patients:  140 - 180 mg/dL   Results for [name] JarvisBULLINS, [name] (MRN [id number]) as of 05/31/2014 08:44  Ref. Range 05/30/2014 03:59 05/30/2014 07:30 05/30/2014 11:39 05/30/2014 16:37 05/30/2014 20:09 05/31/2014 00:08 05/31/2014 03:25  Glucose-Capillary Latest Range: 70-99 mg/dL 409204 ([name]) 811185 ([name]) 914221 ([name]) 218 ([name]) 211 ([name]) 188 ([name]) 182 ([name])  Results for [name] JarvisBULLINS, [name] (MRN [id number]) as of 05/31/2014 08:44  Ref. Range 05/29/2014 02:16  Hemoglobin A1C Latest Range: 4.8-5.6 % 7.9 ([name])   Diabetes history: DM2 Outpatient Diabetes medications: None Current orders for Inpatient glycemic control: Lantus 12 units QHS, Novolog 0-9 units Q4H  Inpatient Diabetes Program Recommendations Correction (SSI): Patient is eating 100% of meals. Please consider changing frequency of CBGs and Novolog correction to ACHS. Insulin - Meal Coverage: If steroids are continued, please consider ordering Novolog 3 units TID with meals for meal coverage (in addition to Novolog correction scale). Outpatient DM medications: No DM medications listed on home medication list. A1C was 7.9% 05/29/14. May need to start oral DM medication as an outpatient to improve glycemic control.  Thanks, Orlando PennerMarie Zavian Slowey, RN, MSN, CCRN, CDE Diabetes Coordinator Inpatient Diabetes Program [phone] (Team Pager from 8am to 5pm) [phone] ([facility] office) [phone] [facility] office)

## 2014-05-31 NOTE — Plan of Care (Signed)
Problem: Phase II Progression Outcomes Goal: Progress activity as tolerated unless otherwise ordered Outcome: Progressing Patient ambulated in hallway without difficulty.

## 2014-06-01 LAB — GLUCOSE, CAPILLARY
GLUCOSE-CAPILLARY: 274 mg/dL — AB (ref 70–99)
GLUCOSE-CAPILLARY: 254 mg/dL — AB (ref 70–99)
Glucose-Capillary: 153 mg/dL — ABNORMAL HIGH (ref 70–99)
Glucose-Capillary: 263 mg/dL — ABNORMAL HIGH (ref 70–99)

## 2014-06-01 MED ORDER — CEFUROXIME AXETIL 250 MG PO TABS
500.0000 mg | ORAL_TABLET | Freq: Two times a day (BID) | ORAL | Status: DC
  Administered 2014-06-01 – 2014-06-02 (×2): 500 mg via ORAL
  Filled 2014-06-01 (×2): qty 2

## 2014-06-01 MED ORDER — PREDNISONE 20 MG PO TABS
20.0000 mg | ORAL_TABLET | Freq: Every day | ORAL | Status: DC
  Administered 2014-06-02: 20 mg via ORAL
  Filled 2014-06-01: qty 1

## 2014-06-01 MED ORDER — DOXYCYCLINE HYCLATE 100 MG PO TABS
100.0000 mg | ORAL_TABLET | Freq: Two times a day (BID) | ORAL | Status: DC
  Administered 2014-06-01 – 2014-06-02 (×2): 100 mg via ORAL
  Filled 2014-06-01 (×2): qty 1

## 2014-06-01 NOTE — Progress Notes (Addendum)
ANTIBIOTIC CONSULT NOTE - FOLLOW UP  Pharmacy Consult for Vancomycin & Cefepime Indication: pneumonia  Allergies  Allergen Reactions  . Fexofenadine Hcl Other (See Comments)    Heart attack symptoms  . Flonase [Fluticasone Propionate] Other (See Comments)    Heart attack symptoms  . Nasal Spray Other (See Comments)    Heart attack symptoms   . Nasal Spray     CAN NOT TOLERATE ANY NASAL SPRAYS!!!!!  Causes heart attack symptoms    Patient Measurements: Height:  (180.3 cm) Weight: 220 lb 0.3 oz (99.8 kg) IBW/kg (Calculated) : 75.3  Vital Signs: Temp: 98.2 F (36.8 C) (04/01 0534) Temp Source: Oral (04/01 0534) BP: 141/63 mmHg (04/01 0534) Pulse Rate: 76 (04/01 0534) Intake/Output from previous day: 03/31 0701 - 04/01 0700 In: 1120 [P.O.:720; IV Piggyback:400] Out: 4700 [Urine:4700] Intake/Output from this shift: Total I/O In: 120 [P.O.:120] Out: -   Labs:  Recent Labs  05/30/14 0601  WBC 11.0*  HGB 10.0*  PLT 175  CREATININE 1.20   Estimated Creatinine Clearance: 59.1 mL/min (by C-G formula based on Cr of 1.2). No results for input(s): VANCOTROUGH, VANCOPEAK, VANCORANDOM, GENTTROUGH, GENTPEAK, GENTRANDOM, TOBRATROUGH, TOBRAPEAK, TOBRARND, AMIKACINPEAK, AMIKACINTROU, AMIKACIN in the last 72 hours.   Microbiology: Recent Results (from the past 720 hour(s))  Urine culture     Status: None   Collection Time: 05/06/14  7:55 PM  Result Value Ref Range Status   Specimen Description URINE, CATHETERIZED  Final   Special Requests NONE  Final   Colony Count NO GROWTH Performed at [facility]   Final   Culture NO GROWTH Performed at [facility]   Final   Report Status 05/08/2014 FINAL  Final  Urine culture     Status: None   Collection Time: 05/11/14 11:21 AM  Result Value Ref Range Status   Urine Culture, Routine Final report  Final   Result 1 No growth  Final  Urine culture     Status: None   Collection Time: 05/25/14  2:20 AM   Result Value Ref Range Status   Specimen Description URINE, CATHETERIZED  Final   Special Requests NONE  Final   Colony Count NO GROWTH Performed at [facility]   Final   Culture NO GROWTH Performed at [facility]   Final   Report Status 05/26/2014 FINAL  Final  Culture, blood (routine x 2) Call MD if unable to obtain prior to antibiotics being given     Status: None (Preliminary result)   Collection Time: 05/25/14  4:47 AM  Result Value Ref Range Status   Specimen Description BLOOD LEFT HAND  Final   Special Requests BOTTLES DRAWN AEROBIC AND ANAEROBIC 6CC  Final   Culture NO GROWTH 3 DAYS  Final   Report Status PENDING  Incomplete  Culture, blood (routine x 2) Call MD if unable to obtain prior to antibiotics being given     Status: None (Preliminary result)   Collection Time: 05/25/14  4:59 AM  Result Value Ref Range Status   Specimen Description BLOOD RIGHT HAND  Final   Special Requests BOTTLES DRAWN AEROBIC AND ANAEROBIC 6CC  Final   Culture NO GROWTH 3 DAYS  Final   Report Status PENDING  Incomplete  MRSA PCR Screening     Status: None   Collection Time: 05/25/14  1:30 PM  Result Value Ref Range Status   MRSA by PCR NEGATIVE NEGATIVE Final  Urine culture     Status: None  Collection Time: 05/28/14 10:45 PM  Result Value Ref Range Status   Specimen Description URINE, CATHETERIZED  Final   Special Requests NONE  Final   Colony Count NO GROWTH [facility]   Final   Culture NO GROWTH [facility]   Final   Report Status 05/30/2014 FINAL  Final  Culture, blood (routine x 2)     Status: None (Preliminary result)   Collection Time: 05/28/14 11:30 PM  Result Value Ref Range Status   Specimen Description Blood  Final   Special Requests BOTTLES DRAWN AEROBIC AND ANAEROBIC 10CC EACH  Final   Culture PENDING  Incomplete   Report Status PENDING  Incomplete  Culture, blood (routine x 2)     Status: None  (Preliminary result)   Collection Time: 05/28/14 11:30 PM  Result Value Ref Range Status   Specimen Description BLOOD RIGHT HAND  Final   Special Requests   Final    BOTTLES DRAWN AEROBIC AND ANAEROBIC AEB 6CC ANA 4CC   Culture PENDING  Incomplete   Report Status PENDING  Incomplete    Anti-infectives    Start     Dose/Rate Route Frequency Ordered Stop   05/29/14 1200  vancomycin (VANCOCIN) IVPB 750 mg/150 ml premix     750 mg 150 mL/hr over 60 Minutes Intravenous Every 12 hours 05/29/14 0800     05/29/14 1000  ceFEPIme (MAXIPIME) 1 g in dextrose 5 % 50 mL IVPB     1 g 100 mL/hr over 30 Minutes Intravenous Every 8 hours 05/29/14 0229 06/06/14 0959   05/29/14 0145  ceFEPIme (MAXIPIME) 1 g in dextrose 5 % 50 mL IVPB  Status:  Discontinued     1 g 100 mL/hr over 30 Minutes Intravenous 3 times per day 05/29/14 0138 05/29/14 0227   05/29/14 0100  ceFEPIme (MAXIPIME) 2 g in dextrose 5 % 50 mL IVPB     2 g 100 mL/hr over 30 Minutes Intravenous  Once 05/29/14 0044 05/29/14 0204   05/29/14 0015  vancomycin (VANCOCIN) IVPB 1000 mg/200 mL premix     1,000 mg 200 mL/hr over 60 Minutes Intravenous  Once 05/29/14 0012 05/29/14 0133      Assessment: 81 yo M who was admitted 3/25-27 with HCAP returned to ED with progressive shortness of breath & cough.  Re-admitted with acute respiratory failure and sepsis.  He was prescribed Levaquin at discharge on 3/27, but never started taking this medication.  Restarted on empiric, broad-spectrum antibiotics for HCAP.  CXR shows improved aeration on admit.     He is clinically improving and anticipate change to oral abx today per MD.    Renal function remains stable since admission.    Vanc 3/25>>27, 3/29>> Cefepime 3/25>>27, 3/29>>  Goal of Therapy:  Vancomycin trough level 15-20 mcg/ml  Plan:  Continue Cefepime 1gm IV q8h Continue Vancomycin 750mg  IV q12h Check Vancomycin trough at steady state- defer level for now since anticipate change in  therapy today Monitor renal function and cx data  Duration of therapy per MD *Consider d/c Pyridium.  Patient has been on 72hrs at this time.   Elson ClanLilliston, Tynika Luddy Michelle 06/01/2014,11:18 AM

## 2014-06-01 NOTE — Progress Notes (Addendum)
PROGRESS NOTE  [name] [id number] DOB: 04-27-1931 DOA: 05/28/2014 PCP: [name], [name], MD  Summary: 81 year old man hospitalized for severe sepsis and pneumonia, discharged 3/27 presented 3/28 with shortness of breath and was admitted for sepsis, suspected HCAP, acute respiratory failure,  Assessment/Plan: 1. Sepsis. Resolved.   2. Suspected HCAP based on presentation. Chest x-ray without obvious infiltrate. Clinically resolved, no hypoxia. 3. Acute hypoxic respiratory failure, resolved.  4. Acute encephalopathy. Resolved, doing well. 5. Urinary retention, hematuria, massive BPH. Complicated by aspirin and Plavix. Leave foley in place. Hematuria resolving. 6. Normocytic anemia at baseline. 7. Insulin-dependent diabetes mellitus, stable 8. Coronary artery disease, status post CABG 1990s, CEE 2013 9. COPD.  Remains stable. 10. Seizure disorder 11. Dementia, stable; gets agitated at times per son.   Overall doing well, narrow abx and change to oral therapy today  Keep foley   Urology recommends continuing Foley catheter with outpatient f/u in 1-2 days for voiding trial. Hold aspirin and Plavix, continue Avodart.  Home 4/2.  Discussed with son by telephone  Code Status: full code DVT prophylaxis: SCDs Family Communication:   Disposition Plan: home with PT  Brendia Sacksaniel Goodrich, MD  [facility]  Pager [phone] If 7PM-7AM, please contact night-coverage at [email or web address], password [email or web address] 06/01/2014, 12:54 PM  LOS: 3 days   Consultants:  Urology   Procedures:  Foley catheter placement  Antibiotics:  Vanc 3/25>>27, 3/29>> 4/1   Doxycycline 4/1 >>   Ceftin 4/1 >>  Cefepime 3/25>>27, 3/29>> 4/1  HPI/Subjective: Feels great, eating well, breathing well.  Objective: Filed Vitals:   05/31/14 1924 05/31/14 2141 06/01/14 0534 06/01/14 0658  BP:  155/91 141/63   Pulse:  93 76   Temp:  98.6 F (37 C) 98.2 F (36.8 C)   TempSrc:  Oral Oral   Resp:   20 20   Height:      Weight:      SpO2: 94% 96% 99% 97%    Intake/Output Summary (Last 24 hours) at 06/01/14 1254 Last data filed at 06/01/14 0806  Gross per 24 hour  Intake    700 ml  Output   4100 ml  Net  -3400 ml     Filed Weights   05/28/14 2221 05/29/14 0227 05/29/14 0500  Weight: 99.791 kg (220 lb) 100 kg (220 lb 7.4 oz) 99.8 kg (220 lb 0.3 oz)    Exam:     Afebrile greater than 72 hours. Vital signs stable. SPO2 97% on RA. General:  Appears calm and comfortable Cardiovascular: RRR, no m/r/g. No LE edema. Respiratory: CTA bilaterally, no w/r/r. Normal respiratory effort. Psychiatric: grossly normal mood and affect, speech fluent and appropriate Neurologic: grossly non-focal. Urine is clearing, pale yellow in tubing  Data Reviewed:  UOP 4700  CBG stable.  UC no growth  Pending data:  Blood cultures, no growth to date  Scheduled Meds: . ceFEPime (MAXIPIME) IV  1 g Intravenous Q8H  . finasteride  5 mg Oral Daily  . insulin aspart  0-9 Units Subcutaneous TID WC  . insulin glargine  6 Units Subcutaneous QHS  . ipratropium-albuterol  3 mL Nebulization TID  . memantine  10 mg Oral BID  . metoprolol  25 mg Oral BID  . pantoprazole  40 mg Oral Daily  . phenazopyridine  100 mg Oral TID WC  . predniSONE  40 mg Oral Q breakfast  . QUEtiapine  50 mg Oral BID  . simvastatin  20 mg Oral q1800  . vancomycin  750  mg Intravenous Q12H   Continuous Infusions:    Principal Problem:   HCAP (healthcare-associated pneumonia) Active Problems:   Diabetes mellitus   Acute respiratory failure with hypoxia   Sepsis   Hematuria   Acute urinary retention   Time spent 15 minutes

## 2014-06-02 DIAGNOSIS — A419 Sepsis, unspecified organism: Principal | ICD-10-CM

## 2014-06-02 LAB — CULTURE, BLOOD (ROUTINE X 2)
Culture: NO GROWTH
Culture: NO GROWTH
Culture: NO GROWTH

## 2014-06-02 LAB — GLUCOSE, CAPILLARY
Glucose-Capillary: 148 mg/dL — ABNORMAL HIGH (ref 70–99)
Glucose-Capillary: 205 mg/dL — ABNORMAL HIGH (ref 70–99)

## 2014-06-02 MED ORDER — METOPROLOL TARTRATE 50 MG PO TABS: 25.0000 mg | ORAL_TABLET | Freq: Two times a day (BID) | ORAL | Status: DC

## 2014-06-02 MED ORDER — CEFUROXIME AXETIL 500 MG PO TABS: 500.0000 mg | ORAL_TABLET | Freq: Two times a day (BID) | ORAL | Status: DC

## 2014-06-02 MED ORDER — DOXYCYCLINE HYCLATE 100 MG PO TABS
100.0000 mg | ORAL_TABLET | Freq: Two times a day (BID) | ORAL | Status: DC
Start: 2014-06-02 — End: 2014-06-13

## 2014-06-02 MED ORDER — FINASTERIDE 5 MG PO TABS: 5.0000 mg | ORAL_TABLET | Freq: Every day | ORAL | Status: DC

## 2014-06-02 NOTE — Progress Notes (Signed)
Patient with orders to be discharge home. Discharge instruction given, patient and family verbalized understanding. Foley Catheter care and instructions education to patient and family with return demonstration and teach back.  Patient stable. Patient left in private vehicle with family.

## 2014-06-02 NOTE — Discharge Summary (Signed)
Physician Discharge Summary  [name] ZOX:[id number] DOB: March 17, 1931 DOA: 05/28/2014  PCP: Frederica Kuster, MD  Admit date: 05/28/2014 Discharge date: 06/02/2014  Recommendations for Outpatient Follow-up:  1. Urinary retention, gross hematuria, massive BPH. Plavix aspirin on hold. Foley catheter left in place on discharge.    Follow-up Information    Follow up with MILLER, Bertram Millard, MD In 2 weeks.   Specialty:  Family Medicine   Contact information:   [address] [place] [address] [phone]       Follow up with Anner Crete, MD.   Specialty:  Urology   Why:  office will call you for appointment   Contact information:   [address] [place] [address] [phone]      Discharge Diagnoses:  1. Sepsis, suspect secondary to HCAP 2. Suspected HCAP 3. Acute hypoxic respiratory failure 4. Acute encephalopathy 5. Urinary retention with gross hematuria 6. Massive BPH 7. Normocytic anemia 8. Diabetes mellitus type 2 9. Coronary artery disease, status post CABG 1990s, carotid endarterectomy 2013 10. Dementia  Discharge Condition: improved Disposition: home, resume HH  Diet recommendation: heart healthy, diabetic diet  Filed Weights   05/28/14 2221 05/29/14 0227 05/29/14 0500  Weight: 99.791 kg (220 lb) 100 kg (220 lb 7.4 oz) 99.8 kg (220 lb 0.3 oz)    History of present illness:  81 year old man hospitalized for severe sepsis and pneumonia, discharged 3/27 presented 3/28 with shortness of breath and was admitted for sepsis, suspected HCAP, acute respiratory failure.  Hospital Course:  Mr. Fester was treated with empiric antibiotics with rapid resolution of hypoxia, normalization of respiratory status. Presumably had pneumonia. Developed hematuria and was seen in concert with urology, Foley catheter was placed, clots resolved and he has required no irrigation for over 24 hours. Aspirin Plavix were stopped. Urology plans to follow-up in the  office 1-2 weeks for voiding trial. Continue Avodart. Patient has been on oral medication for his diabetes which will be resumed at this point. Lantus has been discontinued. Discussed recommendations with son and daughter-in-law at bedside. Individual issues as below.  1. Sepsis, suspect secondary to HCAP, resolved. 2. Suspected HCAP based on presentation. Chest x-ray without obvious infiltrate. Resolved, no hypoxia. 3. Acute hypoxic respiratory failure, resolved.  4. Acute encephalopathy. Resolved. 5. Urinary retention, gross hematuria, massive BPH/prostate. Complicated by aspirin and Plavix. Leave foley in place. Much improved. Hemoglobin stable. Asymptomatic, no clots. 6. Normocytic anemia at baseline. 7. Diabetes mellitus type 2, stable 8. Coronary artery disease, status post CABG 1990s, CEE 2013 9. COPD. Stable. 10. Seizure disorder 11. Dementia, stable; gets agitated at times per son.   Keep foley   Urology recommended continuing Foley catheter with outpatient f/u in 1-2 weeks for voiding trial. Hold aspirin and Plavix, continue Avodart. I spoke with office which will call the patient for follow-up appointment.  Given instructions instructions to the patient and family to return for clots, difficulty with catheter, dizziness, generalized weakness or evidence of worsening bleeding.  Discharge Instructions  Discharge Instructions    Diet - low sodium heart healthy    Complete by:  As directed      Diet Carb Modified    Complete by:  As directed      Discharge instructions    Complete by:  As directed   Resume glyburide at home dose twice a day as you were doing prior to admission. Check your blood sugars regularly and call your doctor for greater than 400.  Call your physician or seek immediate medical attention for pain, clots, bleeding, shortness of breath, dizziness, generalized weakness or worsening of condition. The bladder doctor wants you to keep your catheter and he will  make an appointment to see him in 1-2 weeks. If you have clots or problems with catheter call the office or seek medical attention.     Increase activity slowly    Complete by:  As directed           Current Discharge Medication List    START taking these medications   Details  cefUROXime (CEFTIN) 500 MG tablet Take 1 tablet (500 mg total) by mouth 2 (two) times daily with a meal. Qty: 6 tablet, Refills: 0    doxycycline (VIBRA-TABS) 100 MG tablet Take 1 tablet (100 mg total) by mouth every 12 (twelve) hours. Qty: 6 tablet, Refills: 0    finasteride (PROSCAR) 5 MG tablet Take 1 tablet (5 mg total) by mouth daily. Qty: 30 tablet, Refills: 0      CONTINUE these medications which have CHANGED   Details  metoprolol (LOPRESSOR) 50 MG tablet Take 0.5 tablets (25 mg total) by mouth 2 (two) times daily.      CONTINUE these medications which have NOT CHANGED   Details  iron polysaccharides (NIFEREX) 150 MG capsule Take 1 capsule (150 mg total) by mouth 2 (two) times daily. Qty: 60 capsule, Refills: 0    LORazepam (ATIVAN) 0.5 MG tablet 1-2 tablets twice daily as needed Qty: 120 tablet, Refills: 0    memantine (NAMENDA) 10 MG tablet Take 10 mg by mouth 2 (two) times daily.    pantoprazole (PROTONIX) 40 MG tablet Take 1 tablet (40 mg total) by mouth daily. Qty: 30 tablet, Refills: 0    QUEtiapine (SEROQUEL) 50 MG tablet TAKE 1 TABLET BY MOUTH IN THE MORNING AND 2 TABLETS AT BEDTIME Qty: 90 tablet, Refills: 2    simvastatin (ZOCOR) 20 MG tablet Take 1 tablet (20 mg total) by mouth daily at 6 PM. Qty: 90 tablet, Refills: 3    albuterol (PROVENTIL) (2.5 MG/3ML) 0.083% nebulizer solution Take 3 mLs (2.5 mg total) by nebulization every 6 (six) hours as needed for wheezing or shortness of breath. Qty: 75 mL, Refills: 12      STOP taking these medications     aspirin EC 81 MG EC tablet      clopidogrel (PLAVIX) 75 MG tablet      furosemide (LASIX) 20 MG tablet      levofloxacin  (LEVAQUIN) 750 MG tablet      insulin glargine (LANTUS) 100 UNIT/ML injection        Allergies  Allergen Reactions  . Fexofenadine Hcl Other (See Comments)    Heart attack symptoms  . Flonase [Fluticasone Propionate] Other (See Comments)    Heart attack symptoms  . Nasal Spray Other (See Comments)    Heart attack symptoms   . Nasal Spray     CAN NOT TOLERATE ANY NASAL SPRAYS!!!!!  Causes heart attack symptoms    The results of significant diagnostics from this hospitalization (including imaging, microbiology, ancillary and laboratory) are listed below for reference.    Significant Diagnostic Studies: Dg Chest 2 View  05/29/2014   CLINICAL DATA:  Shortness of breath cough for 5 months.  EXAM: CHEST  2 VIEW  COMPARISON:  Portable AP view 05/25/2014  FINDINGS: Patient is post median sternotomy with multiple mediastinal clips and intact sternal wires. Improved aeration at the left lung base. Pulmonary  vasculature is normal. No consolidation, pleural effusion, or pneumothorax. No acute osseous abnormalities are seen.  IMPRESSION: No acute pulmonary process. Improved aeration at the left lung base.   Electronically Signed   By: Rubye OaksMelanie  Ehinger M.D.   On: 05/29/2014 00:21   Ct Abdomen Pelvis W Contrast  05/25/2014   CLINICAL DATA:  History of Coronary artery disease status post CABG. Sepsis with fever.  EXAM: CT ABDOMEN AND PELVIS WITH CONTRAST  TECHNIQUE: Multidetector CT imaging of the abdomen and pelvis was performed using the standard protocol following bolus administration of intravenous contrast.  CONTRAST:  50mL OMNIPAQUE IOHEXOL 300 MG/ML SOLN, 100mL OMNIPAQUE IOHEXOL 300 MG/ML SOLN  COMPARISON:  None.  FINDINGS: Lower chest:  The lung bases are clear.  Hepatobiliary: There is no focal liver abnormality identified. The gallbladder appears normal. No biliary dilatation.  Pancreas: Negative  Spleen: Negative  Adrenals/Urinary Tract: Normal adrenal glands. Mild bilateral and symmetric  perinephric fat stranding, nonspecific. Renal vascular calcifications are identified. No nephrolithiasis or obstructive uropathy identified. The urinary bladder appears within normal limits.  Stomach/Bowel: The stomach is within normal limits. The small bowel loops have a normal course and caliber. No obstruction. Normal appearance of the colon. There are multiple distal colonic diverticula identified without acute inflammation.  Vascular/Lymphatic: Calcified atherosclerotic disease involves the abdominal aorta. No aneurysm. No enlarged retroperitoneal or mesenteric adenopathy. No enlarged pelvic or inguinal lymph nodes.  Reproductive: Prostate gland enlargement noted. Symmetric appearance of the seminal vesicles.  Other: There is no ascites or focal fluid collections within the abdomen or pelvis.  Musculoskeletal: No acute bone abnormalities noted. There is mild lumbar degenerative disc disease.  IMPRESSION: 1. No acute findings identified. 2. Atherosclerotic disease. 3. Prostate gland enlargement.   Electronically Signed   By: Signa Kellaylor  Stroud M.D.   On: 05/25/2014 16:16   [place] Renal  05/29/2014   CLINICAL DATA:  Gross hematuria  EXAM: RENAL/URINARY TRACT ULTRASOUND COMPLETE  COMPARISON:  CT abdomen and pelvis May 25, 2014  FINDINGS: Right Kidney:  Length: 10.6 cm. Echogenicity and renal cortical thickness are within normal limits. No mass, perinephric fluid, or hydronephrosis visualized. No sonographically demonstrable calculus or ureterectasis.  Left Kidney:  Length: 11.8 cm. Echogenicity and renal cortical thickness are within normal limits. No mass, perinephric fluid, or hydronephrosis visualized. No sonographically demonstrable calculus or ureterectasis.  Bladder:  There is an apparent mass arising from the wall of the inferior urinary bladder measuring 8.1 x 5.2 x 0.9 cm.  IMPRESSION: Apparent mass arising from the inferior urinary bladder. Advise cystoscopy to further assess. Kidneys appear normal  bilaterally.  These results will be called to the ordering clinician or representative by the Radiologist Assistant, and communication documented in the PACS or zVision Dashboard.   Electronically Signed   By: Bretta BangWilliam  Woodruff III M.D.   On: 05/29/2014 10:45   Microbiology: Recent Results (from the past 240 hour(s))  Urine culture     Status: None   Collection Time: 05/25/14  2:20 AM  Result Value Ref Range Status   Specimen Description URINE, CATHETERIZED  Final   Special Requests NONE  Final   Colony Count NO GROWTH [facility]formed at [facility]   Final   Culture NO GROWTH [facility]formed at [facility]   Final   Report Status 05/26/2014 FINAL  Final  Culture, blood (routine x 2) Call MD if unable to obtain prior to antibiotics being given     Status: None   Collection Time: 05/25/14  4:47  AM  Result Value Ref Range Status   Specimen Description BLOOD LEFT HAND  Final   Special Requests BOTTLES DRAWN AEROBIC AND ANAEROBIC 6CC  Final   Culture NO GROWTH 8 DAYS  Final   Report Status 06/02/2014 FINAL  Final  Culture, blood (routine x 2) Call MD if unable to obtain prior to antibiotics being given     Status: None   Collection Time: 05/25/14  4:59 AM  Result Value Ref Range Status   Specimen Description BLOOD RIGHT HAND  Final   Special Requests BOTTLES DRAWN AEROBIC AND ANAEROBIC 6CC  Final   Culture NO GROWTH 8 DAYS  Final   Report Status 06/02/2014 FINAL  Final  MRSA PCR Screening     Status: None   Collection Time: 05/25/14  1:30 PM  Result Value Ref Range Status   MRSA by PCR NEGATIVE NEGATIVE Final  Urine culture     Status: None   Collection Time: 05/28/14 10:45 PM  Result Value Ref Range Status   Specimen Description URINE, CATHETERIZED  Final   Special Requests NONE  Final   Colony Count NO GROWTH Performed at [facility]   Final   Culture NO GROWTH Performed at [facility]   Final   Report Status 05/30/2014 FINAL  Final  Culture, blood  (routine x 2)     Status: None (Preliminary result)   Collection Time: 05/28/14 11:30 PM  Result Value Ref Range Status   Specimen Description Blood  Final   Special Requests BOTTLES DRAWN AEROBIC AND ANAEROBIC 10CC EACH  Final   Culture NO GROWTH 4 DAYS  Final   Report Status PENDING  Incomplete  Culture, blood (routine x 2)     Status: None   Collection Time: 05/28/14 11:30 PM  Result Value Ref Range Status   Specimen Description BLOOD RIGHT HAND  Final   Special Requests   Final    BOTTLES DRAWN AEROBIC AND ANAEROBIC AEB 6CC ANA 4CC   Culture NO GROWTH 5 DAYS  Final   Report Status 06/02/2014 FINAL  Final     Labs: Basic Metabolic Panel:  Recent Labs Lab 05/27/14 1126 05/28/14 2330 05/29/14 0216 05/30/14 0601  NA 137 137 138 142  K 3.8 3.0* 3.9 4.0  CL 100 103 107 106  CO2 GLUCOSE 138* 140* 265* 218*  BUN CREATININE 1.15 1.28 1.16 1.20  CALCIUM 8.5 7.5* 7.5* 8.4   Liver Function Tests:  Recent Labs Lab 05/29/14 0216 05/30/14 0601  AST 27 25  ALT 19 18  ALKPHOS 43 37*  BILITOT 0.6 0.3  PROT 6.6 6.1  ALBUMIN 3.3* 2.9*   CBC:  Recent Labs Lab 05/27/14 1126 05/28/14 2330 05/29/14 0216 05/30/14 0601  WBC 7.3 8.6 9.1 11.0*  NEUTROABS  --  7.4 8.2* 9.6*  HGB 12.5* 10.5* 11.0* 10.0*  HCT 38.2* 31.6* 33.7* 31.5*  MCV 97.2 97.2 96.6 98.1  PLT 208 159 167 175   Cardiac Enzymes:  Recent Labs Lab 05/28/14 2330  TROPONINI <0.03     Recent Labs  04/10/14 2108 05/28/14 2329  BNP 21.0 32.0    CBG:  Recent Labs Lab 06/01/14 1140 06/01/14 1642 06/01/14 2044 06/02/14 0740 06/02/14 1108  GLUCAP 263* 274* 254* 148* 205*    Principal Problem:   HCAP (healthcare-associated pneumonia) Active Problems:   Diabetes mellitus   Acute respiratory failure with hypoxia   Sepsis   Hematuria  Acute urinary retention   Time coordinating discharge: 35 minutes  Signed:  Brendia Sacks, MD [facility] 06/02/2014,  1:07 PM

## 2014-06-02 NOTE — Progress Notes (Signed)
PROGRESS NOTE  [name]:[id number] DOB: [birth year] DOA: 05/28/2014 PCP: Frederica Kuster, MD  Summary: 81 year old man hospitalized for severe sepsis and pneumonia, discharged 3/27 presented 3/28 with shortness of breath and was admitted for sepsis, suspected HCAP, acute respiratory failure,  Assessment/Plan: 1. Sepsis, suspect secondary to HCAP, resolved. 2. Suspected HCAP based on presentation. Chest x-ray without obvious infiltrate. Resolved, no hypoxia. 3. Acute hypoxic respiratory failure, resolved.  4. Acute encephalopathy. Resolved. 5. Urinary retention, gross hematuria, massive BPH/prostate. Complicated by aspirin and Plavix. Leave foley in place.  Asymptomatic, no clots. 6. Normocytic anemia at baseline. 7. Insulin-dependent diabetes mellitus, remains stable 8. Coronary artery disease, status post CABG 1990s, CEE 2013 9. COPD. Stable. 10. Seizure disorder 11. Dementia, stable; gets agitated at times per son.   Doing well, hematuria resolving and asymptomatic.  Keep foley   Urology recommended continuing Foley catheter with outpatient f/u in 1-2 weeks for voiding trial. Hold aspirin and Plavix, continue Avodart. I spoke with office which will call the patient for follow-up appointment.  Code Status: full code DVT prophylaxis: SCDs Family Communication:   Disposition Plan: home with PT, RN  Brendia Sacks, MD  [facility]  Pager [phone] If 7PM-7AM, please contact night-coverage at [email or web address], password [facility] 06/02/2014, 12:03 PM  LOS: 4 days   Consultants:  Urology   Procedures:  Foley catheter placement  Antibiotics:  Vanc 3/25>>27, 3/29>> 4/1   Doxycycline 4/1 >> 4/5  Ceftin 4/1 >> 4/5  Cefepime 3/25>>27, 3/29>> 4/1  HPI/Subjective: "I feel great! I'm ready to go home." walking well, no dizziness, no clots, no pain. Breathing well.   RN reports no concerns, no urine clots, no irrigation recently.  Objective: Filed Vitals:    06/01/14 2010 06/01/14 2045 06/02/14 0521 06/02/14 0739  BP:  143/73 157/67   Pulse:  102 76   Temp:  98.7 F (37.1 C) 97.7 F (36.5 C)   TempSrc:  Oral Oral   Resp:  20 20   Height:      Weight:      SpO2: 91% 94% 97% 93%    Intake/Output Summary (Last 24 hours) at 06/02/14 1203 Last data filed at 06/02/14 0522  Gross per 24 hour  Intake    960 ml  Output   4900 ml  Net  -3940 ml     Filed Weights   05/28/14 2221 05/29/14 0227 05/29/14 0500  Weight: 99.791 kg (220 lb) 100 kg (220 lb 7.4 oz) 99.8 kg (220 lb 0.3 oz)    Exam:     Afebrile greater than 96 hours. Vital signs stable. SPO2 93% on RA. General:  Appears comfortable, calm. Sitting in chair. Cardiovascular: Regular rate and rhythm, no murmur, rub or gallop.  Respiratory: Clear to auscultation bilaterally, no wheezes, rales or rhonchi. Normal respiratory effort. Musculoskeletal: grossly normal tone bilateral upper and lower extremities Psychiatric: grossly normal mood and affect, speech fluent and appropriate  Data Reviewed:  CBG stable.  UC no growth  Pending data:  Blood cultures, no growth to date  Scheduled Meds: . cefUROXime  500 mg Oral BID WC  . doxycycline  100 mg Oral Q12H  . finasteride  5 mg Oral Daily  . insulin aspart  0-9 Units Subcutaneous TID WC  . insulin glargine  6 Units Subcutaneous QHS  . ipratropium-albuterol  3 mL Nebulization TID  . memantine  10 mg Oral BID  . metoprolol  25 mg Oral BID  . pantoprazole  40 mg Oral Daily  .  predniSONE  20 mg Oral Q breakfast  . QUEtiapine  50 mg Oral BID  . simvastatin  20 mg Oral q1800   Continuous Infusions:    Principal Problem:   HCAP (healthcare-associated pneumonia) Active Problems:   Diabetes mellitus   Acute respiratory failure with hypoxia   Sepsis   Hematuria   Acute urinary retention

## 2014-06-03 LAB — CULTURE, BLOOD (ROUTINE X 2): CULTURE: NO GROWTH

## 2014-06-04 NOTE — Telephone Encounter (Signed)
Pt aware per note on urine culture lab result, will close encounter.

## 2014-06-05 ENCOUNTER — Encounter (HOSPITAL_COMMUNITY): Payer: Self-pay | Admitting: *Deleted

## 2014-06-05 ENCOUNTER — Emergency Department (HOSPITAL_COMMUNITY)
Admission: EM | Admit: 2014-06-05 | Discharge: 2014-06-05 | Disposition: A | Discharge status: Home / Self Care | Payer: Medicare Other | Attending: Emergency Medicine | Admitting: Emergency Medicine

## 2014-06-05 DIAGNOSIS — F0391 Unspecified dementia with behavioral disturbance: Secondary | ICD-10-CM | POA: Diagnosis not present

## 2014-06-05 DIAGNOSIS — E785 Hyperlipidemia, unspecified: Secondary | ICD-10-CM | POA: Diagnosis not present

## 2014-06-05 DIAGNOSIS — Z87440 Personal history of urinary (tract) infections: Secondary | ICD-10-CM | POA: Insufficient documentation

## 2014-06-05 DIAGNOSIS — Z8701 Personal history of pneumonia (recurrent): Secondary | ICD-10-CM | POA: Insufficient documentation

## 2014-06-05 DIAGNOSIS — T83091A Other mechanical complication of indwelling urethral catheter, initial encounter: Secondary | ICD-10-CM

## 2014-06-05 DIAGNOSIS — M1990 Unspecified osteoarthritis, unspecified site: Secondary | ICD-10-CM | POA: Diagnosis not present

## 2014-06-05 DIAGNOSIS — G40909 Epilepsy, unspecified, not intractable, without status epilepticus: Secondary | ICD-10-CM | POA: Diagnosis not present

## 2014-06-05 DIAGNOSIS — Z8673 Personal history of transient ischemic attack (TIA), and cerebral infarction without residual deficits: Secondary | ICD-10-CM | POA: Diagnosis not present

## 2014-06-05 DIAGNOSIS — K219 Gastro-esophageal reflux disease without esophagitis: Secondary | ICD-10-CM | POA: Diagnosis not present

## 2014-06-05 DIAGNOSIS — H9190 Unspecified hearing loss, unspecified ear: Secondary | ICD-10-CM | POA: Diagnosis not present

## 2014-06-05 DIAGNOSIS — Z862 Personal history of diseases of the blood and blood-forming organs and certain disorders involving the immune mechanism: Secondary | ICD-10-CM | POA: Diagnosis not present

## 2014-06-05 DIAGNOSIS — E119 Type 2 diabetes mellitus without complications: Secondary | ICD-10-CM | POA: Insufficient documentation

## 2014-06-05 DIAGNOSIS — T83098A Other mechanical complication of other indwelling urethral catheter, initial encounter: Secondary | ICD-10-CM | POA: Insufficient documentation

## 2014-06-05 DIAGNOSIS — R339 Retention of urine, unspecified: Secondary | ICD-10-CM | POA: Diagnosis present

## 2014-06-05 DIAGNOSIS — Z9889 Other specified postprocedural states: Secondary | ICD-10-CM | POA: Insufficient documentation

## 2014-06-05 DIAGNOSIS — Y846 Urinary catheterization as the cause of abnormal reaction of the patient, or of later complication, without mention of misadventure at the time of the procedure: Secondary | ICD-10-CM | POA: Insufficient documentation

## 2014-06-05 DIAGNOSIS — I10 Essential (primary) hypertension: Secondary | ICD-10-CM | POA: Insufficient documentation

## 2014-06-05 DIAGNOSIS — Z87891 Personal history of nicotine dependence: Secondary | ICD-10-CM | POA: Insufficient documentation

## 2014-06-05 DIAGNOSIS — T8351XA Infection and inflammatory reaction due to indwelling urinary catheter, initial encounter: Secondary | ICD-10-CM | POA: Diagnosis not present

## 2014-06-05 DIAGNOSIS — Z951 Presence of aortocoronary bypass graft: Secondary | ICD-10-CM | POA: Insufficient documentation

## 2014-06-05 DIAGNOSIS — Z79899 Other long term (current) drug therapy: Secondary | ICD-10-CM | POA: Insufficient documentation

## 2014-06-05 DIAGNOSIS — T83511A Infection and inflammatory reaction due to indwelling urethral catheter, initial encounter: Secondary | ICD-10-CM

## 2014-06-05 DIAGNOSIS — G8929 Other chronic pain: Secondary | ICD-10-CM | POA: Diagnosis not present

## 2014-06-05 DIAGNOSIS — N390 Urinary tract infection, site not specified: Secondary | ICD-10-CM

## 2014-06-05 DIAGNOSIS — I2510 Atherosclerotic heart disease of native coronary artery without angina pectoris: Secondary | ICD-10-CM | POA: Insufficient documentation

## 2014-06-05 LAB — URINALYSIS, ROUTINE W REFLEX MICROSCOPIC
Glucose, UA: 100 mg/dL — AB
Nitrite: POSITIVE — AB
Protein, ur: >300 mg/dL — AB
SPECIFIC GRAVITY, URINE: 1.02 (ref 1.005–1.030)
Urobilinogen, UA: 1 mg/dL (ref 0.0–1.0)
pH: 6.5 (ref 5.0–8.0)

## 2014-06-05 LAB — URINE MICROSCOPIC-ADD ON

## 2014-06-05 MED ORDER — CIPROFLOXACIN HCL 500 MG PO TABS: 500.0000 mg | ORAL_TABLET | Freq: Two times a day (BID) | ORAL | Status: DC

## 2014-06-05 NOTE — ED Notes (Addendum)
Pt complaining of a burning sensation that started today. Pt also says some clear fluid leaking from foley. Had 7 ml of fluid in the balloon. Added 3 ml, per information at time of placement the balloon was a 10 ml.

## 2014-06-05 NOTE — ED Notes (Signed)
Pt reports that he as a UTI.  States that he had a catheter placed last week, reports catheter is burning and leaking at this time.

## 2014-06-05 NOTE — Discharge Instructions (Signed)
Cipro as prescribed.  Follow-up with urology as previously recommended, and return to the ER if you experience any new and concerning symptoms.   Urinary Tract Infection Urinary tract infections (UTIs) can develop anywhere along your urinary tract. Your urinary tract is your body's drainage system for removing wastes and extra water. Your urinary tract includes two kidneys, two ureters, a bladder, and a urethra. Your kidneys are a pair of bean-shaped organs. Each kidney is about the size of your fist. They are located below your ribs, one on each side of your spine. CAUSES Infections are caused by microbes, which are microscopic organisms, including fungi, viruses, and bacteria. These organisms are so small that they can only be seen through a microscope. Bacteria are the microbes that most commonly cause UTIs. SYMPTOMS  Symptoms of UTIs may vary by age and gender of the patient and by the location of the infection. Symptoms in young women typically include a frequent and intense urge to urinate and a painful, burning feeling in the bladder or urethra during urination. Older women and men are more likely to be tired, shaky, and weak and have muscle aches and abdominal pain. A fever may mean the infection is in your kidneys. Other symptoms of a kidney infection include pain in your back or sides below the ribs, nausea, and vomiting. DIAGNOSIS To diagnose a UTI, your caregiver will ask you about your symptoms. Your caregiver also will ask to provide a urine sample. The urine sample will be tested for bacteria and white blood cells. White blood cells are made by your body to help fight infection. TREATMENT  Typically, UTIs can be treated with medication. Because most UTIs are caused by a bacterial infection, they usually can be treated with the use of antibiotics. The choice of antibiotic and length of treatment depend on your symptoms and the type of bacteria causing your infection. HOME CARE  INSTRUCTIONS  If you were prescribed antibiotics, take them exactly as your caregiver instructs you. Finish the medication even if you feel better after you have only taken some of the medication.  Drink enough water and fluids to keep your urine clear or pale yellow.  Avoid caffeine, tea, and carbonated beverages. They tend to irritate your bladder.  Empty your bladder often. Avoid holding urine for long periods of time.  Empty your bladder before and after sexual intercourse.  After a bowel movement, women should cleanse from front to back. Use each tissue only once. SEEK MEDICAL CARE IF:   You have back pain.  You develop a fever.  Your symptoms do not begin to resolve within 3 days. SEEK IMMEDIATE MEDICAL CARE IF:   You have severe back pain or lower abdominal pain.  You develop chills.  You have nausea or vomiting.  You have continued burning or discomfort with urination. MAKE SURE YOU:   Understand these instructions.  Will watch your condition.  Will get help right away if you are not doing well or get worse. Document Released: 11/26/2004 Document Revised: 08/18/2011 Document Reviewed: 03/27/2011 [facility] Patient Information 2015 [employer], [place]. This information is not intended to replace advice given to you by your health care provider. Make sure you discuss any questions you have with your health care provider.

## 2014-06-05 NOTE — ED Notes (Signed)
Pt alert & oriented x4, stable gait. Patient  given discharge instructions, paperwork & prescription(s).  Patient verbalized understanding. Pt left department in wheelchair w/ no further questions. 

## 2014-06-05 NOTE — ED Provider Notes (Signed)
CSN: [id number]     Arrival date & time 06/05/14  0351 History   First MD Initiated Contact with Patient 06/05/14 [phone]     Chief Complaint  Patient presents with  . Urinary Tract Infection     (Consider location/radiation/quality/duration/timing/severity/associated sxs/prior Treatment) HPI Comments: Patient is an 81 year old male with history of diabetes and BPH. He was recently admitted during which time he experienced urinary retention and required a Foley catheter. He was discharged 3 days ago with a catheter in place awaiting urology follow-up. This evening he developed burning around his penis. There is very little catheter output and he noticed he was having leaking around the catheter itself.  Patient is a 81 y.o. male presenting with urinary tract infection. The history is provided by the patient.  Urinary Tract Infection This is a new problem. The current episode started yesterday. The problem occurs constantly. The problem has been gradually worsening. Associated symptoms include abdominal pain. Pertinent negatives include no chest pain. Nothing aggravates the symptoms. Nothing relieves the symptoms. He has tried nothing for the symptoms. The treatment provided no relief.    Past Medical History  Diagnosis Date  . Diabetes mellitus type 2   . GERD (gastroesophageal reflux disease)   . Hyperlipidemia     takes Niacin nightly  . DJD (degenerative joint disease)   . Hypertension     takes Metoprolol and Ramipril daily  . Dizziness   . Gout     hx of--52yrs ago  . Bruises easily     pt is on Plavix and ASA  . Shortness of breath     wakes pt up during night with occ shortness of breathe;states it happens about once a month  . Impaired hearing   . Urinary frequency   . UTI (lower urinary tract infection)     hx of--03/2010  . Anemia     takes Glyburide bid  . CAD (coronary artery disease) of artery bypass graft   . Pneumonia     "at least twice that I know of"  . Seizures  05/14/11    "first time was today"  . CVA (cerebral vascular accident) ~ 2009    right arm/leg occ hurts  . Carotid artery occlusion 2013    s/p bilateral endarterectomy  . Chronic headache   . Chronic neck pain   . Dementia with behavioral disturbance   . Dyspnea     chronic, daily   Past Surgical History  Procedure Laterality Date  . Appendectomy  1968  . Colonoscopy    . Endarterectomy  04/09/2011    Procedure: ENDARTERECTOMY CAROTID;  Surgeon: Chuck Hint, MD;  Location: [facility] OR;  Service: Vascular;  Laterality: Left;  Left Carotid endarterectomy With Dacron Patch Angioplasty  . Cardiac catheterization  1998    Dr. Antoine Poche  . Endarterectomy  05/08/2011    Procedure: ENDARTERECTOMY CAROTID;  Surgeon: Chuck Hint, MD;  Location: [facility] OR;  Service: Vascular;  Laterality: Right;  with [place] Patch Angioplasty  . Cataract extraction w/ intraocular lens  implant, bilateral  2012  . Coronary artery bypass graft  1998    LIMA to LAD, SVG to diagonal, SVG to PDA, SVG to posterior lateral.   . Eye surgery  1953    right ; "piece of metal removed"  . Eye surgery  2012    Cataract- Bilateral   Family History  Problem Relation Age of Onset  . Diabetes Sister   . Cancer Brother   .  Diabetes Brother   . Anesthesia problems Neg Hx   . Hypotension Neg Hx   . Malignant hyperthermia Neg Hx   . Pseudochol deficiency Neg Hx   . Hearing loss Father    History  Substance Use Topics  . Smoking status: Former Smoker -- 1.00 packs/day for 20 years    Types: Cigarettes    Quit date: 03/02/1974  . Smokeless tobacco: Never Used  . Alcohol Use: No     Comment: quit in 1981    Review of Systems  Cardiovascular: Negative for chest pain.  Gastrointestinal: Positive for abdominal pain.  All other systems reviewed and are negative.     Allergies  Fexofenadine hcl; Flonase; Nasal spray; and Nasal spray  Home Medications   Prior to Admission medications   Medication  Sig Start Date End Date Taking? Authorizing Provider  albuterol (PROVENTIL) (2.5 MG/3ML) 0.083% nebulizer solution Take 3 mLs (2.5 mg total) by nebulization every 6 (six) hours as needed for wheezing or shortness of breath. Patient not taking: Reported on 05/29/2014 05/27/14   Erick Blinks, MD  cefUROXime (CEFTIN) 500 MG tablet Take 1 tablet (500 mg total) by mouth 2 (two) times daily with a meal. 06/02/14   Standley Brooking, MD  doxycycline (VIBRA-TABS) 100 MG tablet Take 1 tablet (100 mg total) by mouth every 12 (twelve) hours. 06/02/14   Standley Brooking, MD  finasteride (PROSCAR) 5 MG tablet Take 1 tablet (5 mg total) by mouth daily. 06/02/14   Standley Brooking, MD  iron polysaccharides (NIFEREX) 150 MG capsule Take 1 capsule (150 mg total) by mouth 2 (two) times daily. 05/07/14   Gwenyth Bender, NP  LORazepam (ATIVAN) 0.5 MG tablet 1-2 tablets twice daily as needed Patient taking differently: Take 0.5-1 mg by mouth 2 (two) times daily as needed for anxiety.  05/17/14   Frederica Kuster, MD  memantine (NAMENDA) 10 MG tablet Take 10 mg by mouth 2 (two) times daily.    Historical Provider, MD  metoprolol (LOPRESSOR) 50 MG tablet Take 0.5 tablets (25 mg total) by mouth 2 (two) times daily. 06/02/14   Standley Brooking, MD  pantoprazole (PROTONIX) 40 MG tablet Take 1 tablet (40 mg total) by mouth daily. 05/07/14   Gwenyth Bender, NP  QUEtiapine (SEROQUEL) 50 MG tablet TAKE 1 TABLET BY MOUTH IN THE MORNING AND 2 TABLETS AT BEDTIME 05/24/14   Frederica Kuster, MD  simvastatin (ZOCOR) 20 MG tablet Take 1 tablet (20 mg total) by mouth daily at 6 PM. 05/22/14   Rollene Rotunda, MD   BP 172/55 mmHg  Pulse 56  Temp(Src) 98.7 F (37.1 C) (Oral)  Resp 18  Ht  (1.803 m)  Wt 220 lb (99.791 kg)  BMI 30.70 kg/m2  SpO2 98% Physical Exam  Constitutional: He is oriented to person, place, and time. He appears well-developed and well-nourished. No distress.  HENT:  Head: Normocephalic and atraumatic.  Neck: Normal  range of motion. Neck supple.  Cardiovascular: Normal rate, regular rhythm and normal heart sounds.   No murmur heard. Pulmonary/Chest: Effort normal and breath sounds normal. No respiratory distress. He has no wheezes.  Abdominal: Soft. Bowel sounds are normal. He exhibits distension.  There is suprapubic fullness.  The catheter bag is in place with a very small amount of blood-tinged urine and small clots.  Musculoskeletal: Normal range of motion. He exhibits no edema.  Neurological: He is alert and oriented to person, place, and time.  Skin: Skin  is warm and dry. He is not diaphoretic.  Nursing note and vitals reviewed.   ED Course  Procedures (including critical care time) Labs Review Labs Reviewed  URINALYSIS, ROUTINE W REFLEX MICROSCOPIC    Imaging Review No results found.   EKG Interpretation None      MDM   Final diagnoses:  None    The catheter was irrigated and 1300 mL of urine was obtained. Appears as though there was a small clot obstructing outflow. He now feels fine and appears appropriate for discharge. His urinalysis reveals positive nitrites and many bacteria. He will be given an antibiotic to treat for this. He is to follow-up with urology as previously recommended.    Geoffery Lyonsouglas Tamzin Bertling, MD 06/05/14 [phone]

## 2014-06-06 ENCOUNTER — Other Ambulatory Visit (HOSPITAL_COMMUNITY): Payer: Medicare Other

## 2014-06-06 ENCOUNTER — Ambulatory Visit: Payer: Medicare Other | Admitting: Vascular Surgery

## 2014-06-07 ENCOUNTER — Telehealth: Payer: Self-pay | Admitting: *Deleted

## 2014-06-07 NOTE — Telephone Encounter (Signed)
-----   Message from [name] Miller, MD sent at 05/29/2014  8:02 AM EDT ----- °Urine is negative for Legionella °

## 2014-06-07 NOTE — Telephone Encounter (Signed)
Pt notified of results Verbalizes understanding 

## 2014-06-08 ENCOUNTER — Telehealth: Payer: Self-pay | Admitting: Family Medicine

## 2014-06-08 MED ORDER — INDOMETHACIN 25 MG PO CAPS: ORAL_CAPSULE | ORAL | Status: DC

## 2014-06-08 NOTE — Telephone Encounter (Signed)
Medication ordered. Family aware

## 2014-06-08 NOTE — Telephone Encounter (Signed)
Indocin 25 mg  Take 2 tabs tid x 2 days, then 1 tab tid x 5 days # 36

## 2014-06-13 ENCOUNTER — Ambulatory Visit (INDEPENDENT_AMBULATORY_CARE_PROVIDER_SITE_OTHER): Payer: Medicare Other | Admitting: Family Medicine

## 2014-06-13 ENCOUNTER — Encounter: Payer: Self-pay | Admitting: Family Medicine

## 2014-06-13 VITALS — BP 155/70 | HR 79 | Temp 97.0°F | Ht 71.0 in | Wt 214.0 lb

## 2014-06-13 DIAGNOSIS — F0391 Unspecified dementia with behavioral disturbance: Secondary | ICD-10-CM | POA: Diagnosis not present

## 2014-06-13 DIAGNOSIS — I6523 Occlusion and stenosis of bilateral carotid arteries: Secondary | ICD-10-CM

## 2014-06-13 DIAGNOSIS — M10079 Idiopathic gout, unspecified ankle and foot: Secondary | ICD-10-CM | POA: Diagnosis not present

## 2014-06-13 DIAGNOSIS — F03918 Unspecified dementia, unspecified severity, with other behavioral disturbance: Secondary | ICD-10-CM

## 2014-06-13 DIAGNOSIS — R338 Other retention of urine: Secondary | ICD-10-CM | POA: Diagnosis not present

## 2014-06-13 MED ORDER — LORAZEPAM 0.5 MG PO TABS: ORAL_TABLET | ORAL | Status: DC

## 2014-06-13 MED ORDER — ALLOPURINOL 300 MG PO TABS: 300.0000 mg | ORAL_TABLET | Freq: Every day | ORAL | Status: DC

## 2014-06-13 NOTE — Progress Notes (Signed)
Subjective:    Patient ID: [name], male    DOB: [birth year], 81 y.o.   MRN: [id number]  HPI  81 year old gentleman who is here follow-up dementia and since his last visit he has been hospitalized for community-acquired pneumonia and urinary tract infection with retention. He has an indwelling catheter. He has been told his prostate is 20 times larger than normal and the plan is to leave the catheter in to see if there is any shrinkage of the prostate. Otherwise he might he facing prostate surgery but the plan is to hold off except as last resort. He continues to have problems with sleeping at night. There's been no wandering. He still has issues with hostility toward family members. He is conversant today and memory seems to be working pretty good. He also had a recent flareup of gout that Indocin has quieted but last gout attack was less than 1 month ago so allopurinol will be added.    Review of Systems  Genitourinary: Positive for decreased urine volume and difficulty urinating.  Psychiatric/Behavioral: Positive for behavioral problems and sleep disturbance.   Patient Active Problem List   Diagnosis Date Noted  . Acute urinary retention 05/30/2014  . Hematuria 05/29/2014  . Severe sepsis 05/26/2014  . Elevated lactic acid level 05/26/2014  . Hypokalemia 05/26/2014  . HCAP (healthcare-associated pneumonia) 05/25/2014  . Sepsis 05/25/2014  . Acute gout   . Encephalopathy acute 05/06/2014  . UTI (lower urinary tract infection) 05/06/2014  . Acute renal insufficiency 05/06/2014  . Dehydration 05/06/2014  . FTT (failure to thrive) in adult 05/06/2014  . Gout flare 05/06/2014  . Acute respiratory failure with hypoxia 05/06/2014  . Pyrexia   . Renal insufficiency   . Diabetes mellitus due to underlying condition without complications   . COPD exacerbation 04/11/2014  . Pain in the chest   . Dyspnea 04/10/2014  . Chest pain 04/10/2014  . Chronic cough 04/10/2014  .  Essential hypertension 04/10/2014  . Carotid artery occlusion 04/10/2014  . CVA (cerebral vascular accident) 04/10/2014  . Acute bronchospasm 04/10/2014  . Anemia 04/10/2014  . Dementia with behavioral disturbance 11/15/2013  . Cough 11/15/2013  . Benign paroxysmal positional vertigo 01/30/2013  . Adjustment reaction of adult life 01/30/2013  . Need for prophylactic vaccination and inoculation against influenza 12/09/2012  . Calcaneal fracture 09/09/2012  . Seizure 05/14/2011  . Hypotension, unspecified 05/14/2011  . Seizures 05/14/2011  . Preop cardiovascular exam 03/17/2011  . Diabetes mellitus   . CAD (coronary artery disease) of artery bypass graft   . Hyperlipidemia   . Hypertension   . Peripheral vascular disease   . Occlusion and stenosis of carotid artery without mention of cerebral infarction 02/11/2011   Outpatient Encounter Prescriptions as of 06/13/2014  Medication Sig  . albuterol (PROVENTIL) (2.5 MG/3ML) 0.083% nebulizer solution Take 3 mLs (2.5 mg total) by nebulization every 6 (six) hours as needed for wheezing or shortness of breath.  . finasteride (PROSCAR) 5 MG tablet Take 1 tablet (5 mg total) by mouth daily.  . indomethacin (INDOCIN) 25 MG capsule Take 25 mg by mouth 2 (two) times daily with a meal. 2 tabs 3 times a day for 2 days, then 1 tab 3 times a day for 5 days  . iron polysaccharides (NIFEREX) 150 MG capsule Take 1 capsule (150 mg total) by mouth 2 (two) times daily.  Marland Kitchen. LORazepam (ATIVAN) 0.5 MG tablet 1-2 tablets BID PRN and 4 tablets at bedtime  . memantine (NAMENDA)  10 MG tablet Take 10 mg by mouth 2 (two) times daily.  . metoprolol (LOPRESSOR) 50 MG tablet Take 0.5 tablets (25 mg total) by mouth 2 (two) times daily.  . QUEtiapine (SEROQUEL) 50 MG tablet TAKE 1 TABLET BY MOUTH IN THE MORNING AND 2 TABLETS AT BEDTIME  . [DISCONTINUED] cefUROXime (CEFTIN) 500 MG tablet Take 1 tablet (500 mg total) by mouth 2 (two) times daily with a meal.  . [DISCONTINUED]  ciprofloxacin (CIPRO) 500 MG tablet Take 1 tablet (500 mg total) by mouth 2 (two) times daily. One po bid x 7 days  . [DISCONTINUED] doxycycline (VIBRA-TABS) 100 MG tablet Take 1 tablet (100 mg total) by mouth every 12 (twelve) hours.  . [DISCONTINUED] LORazepam (ATIVAN) 0.5 MG tablet 1-2 tablets twice daily as needed (Patient taking differently: Take 0.5-1 mg by mouth 2 (two) times daily as needed for anxiety. )  . allopurinol (ZYLOPRIM) 300 MG tablet Take 1 tablet (300 mg total) by mouth daily.  . [DISCONTINUED] indomethacin (INDOCIN) 25 MG capsule 2 tabs three times a day for 2 days, then 1 tab three times a day for five days  . [DISCONTINUED] pantoprazole (PROTONIX) 40 MG tablet Take 1 tablet (40 mg total) by mouth daily.  . [DISCONTINUED] simvastatin (ZOCOR) 20 MG tablet Take 1 tablet (20 mg total) by mouth daily at 6 PM.      Objective:   Physical Exam  Constitutional: He appears well-developed.  Cardiovascular: Normal rate and regular rhythm.   Pulmonary/Chest: Effort normal and breath sounds normal.  Psychiatric: He has a normal mood and affect.          Assessment & Plan:  1. Dementia with behavioral disturbance Behaviors continued to be a problem despite Namenda, Seroquel and Ativan. There is been some improvement in wandering. Today I suggested increasing Ativan to 3-4 tabs at bedtime along with serum  2. Acute urinary retention Followed by urology no intervention today  3. Acute idiopathic gout of foot, unspecified laterality At allopurinol to try and prevent frequent flares  Frederica Kuster MD

## 2014-06-18 ENCOUNTER — Ambulatory Visit (INDEPENDENT_AMBULATORY_CARE_PROVIDER_SITE_OTHER): Payer: Medicare Other | Admitting: Family Medicine

## 2014-06-18 ENCOUNTER — Encounter: Payer: Self-pay | Admitting: Family Medicine

## 2014-06-18 VITALS — BP 158/78 | HR 100 | Temp 97.2°F | Ht 71.0 in | Wt 217.0 lb

## 2014-06-18 DIAGNOSIS — R251 Tremor, unspecified: Secondary | ICD-10-CM | POA: Diagnosis not present

## 2014-06-18 DIAGNOSIS — G8190 Hemiplegia, unspecified affecting unspecified side: Secondary | ICD-10-CM | POA: Diagnosis not present

## 2014-06-18 DIAGNOSIS — I6523 Occlusion and stenosis of bilateral carotid arteries: Secondary | ICD-10-CM | POA: Diagnosis not present

## 2014-06-18 DIAGNOSIS — I69359 Hemiplegia and hemiparesis following cerebral infarction affecting unspecified side: Secondary | ICD-10-CM

## 2014-06-18 MED ORDER — CARBIDOPA-LEVODOPA 10-100 MG PO TABS: 1.0000 | ORAL_TABLET | Freq: Three times a day (TID) | ORAL | Status: DC

## 2014-06-18 NOTE — Progress Notes (Signed)
Subjective:    Patient ID: [name], male    DOB: 07-02-1931, 81 y.o.   MRN: [id number]  HPI   Chief Complaint  Patient presents with  . Blister    blister on each palm from using walker.  . Extremity Weakness    weak grip and numbess in 4th and 5th digits bilaterally   Weakness 3-4 weeks in both hands. This makes it more difficult for him to ambulate with his walker. He has not had a fall. Blisters on his palm are uncomfortable but do not interfere with use. Associated symptoms negative for dizziness with the weakness. He does have generalized fatigue as well. Location of the blisters is on the palmar surface at the base of the third and fourth digits bilaterally. He is a stroke survivor. This affects his dominant side. Although he is weak all over the weakness is primarily on the right.  Patient Active Problem List   Diagnosis Date Noted  . Acute urinary retention 05/30/2014  . Hematuria 05/29/2014  . Severe sepsis 05/26/2014  . Elevated lactic acid level 05/26/2014  . Hypokalemia 05/26/2014  . HCAP (healthcare-associated pneumonia) 05/25/2014  . Sepsis 05/25/2014  . Acute gout   . Encephalopathy acute 05/06/2014  . UTI (lower urinary tract infection) 05/06/2014  . Acute renal insufficiency 05/06/2014  . Dehydration 05/06/2014  . FTT (failure to thrive) in adult 05/06/2014  . Gout flare 05/06/2014  . Acute respiratory failure with hypoxia 05/06/2014  . Pyrexia   . Renal insufficiency   . Diabetes mellitus due to underlying condition without complications   . COPD exacerbation 04/11/2014  . Pain in the chest   . Dyspnea 04/10/2014  . Chest pain 04/10/2014  . Chronic cough 04/10/2014  . Essential hypertension 04/10/2014  . Carotid artery occlusion 04/10/2014  . CVA (cerebral vascular accident) 04/10/2014  . Acute bronchospasm 04/10/2014  . Anemia 04/10/2014  . Dementia with behavioral disturbance 11/15/2013  . Cough 11/15/2013  . Benign paroxysmal  positional vertigo 01/30/2013  . Adjustment reaction of adult life 01/30/2013  . Need for prophylactic vaccination and inoculation against influenza 12/09/2012  . Calcaneal fracture 09/09/2012  . Seizure 05/14/2011  . Hypotension, unspecified 05/14/2011  . Seizures 05/14/2011  . Preop cardiovascular exam 03/17/2011  . Diabetes mellitus   . CAD (coronary artery disease) of artery bypass graft   . Hyperlipidemia   . Hypertension   . Peripheral vascular disease   . Occlusion and stenosis of carotid artery without mention of cerebral infarction 02/11/2011   Outpatient Encounter Prescriptions as of 06/18/2014  Medication Sig  . albuterol (PROVENTIL) (2.5 MG/3ML) 0.083% nebulizer solution Take 3 mLs (2.5 mg total) by nebulization every 6 (six) hours as needed for wheezing or shortness of breath.  . allopurinol (ZYLOPRIM) 300 MG tablet Take 1 tablet (300 mg total) by mouth daily.  . finasteride (PROSCAR) 5 MG tablet Take 1 tablet (5 mg total) by mouth daily.  . indomethacin (INDOCIN) 25 MG capsule Take 25 mg by mouth 2 (two) times daily with a meal. 2 tabs 3 times a day for 2 days, then 1 tab 3 times a day for 5 days  . iron polysaccharides (NIFEREX) 150 MG capsule Take 1 capsule (150 mg total) by mouth 2 (two) times daily.  Marland Kitchen. LORazepam (ATIVAN) 0.5 MG tablet 1-2 tablets BID PRN and 4 tablets at bedtime  . memantine (NAMENDA) 10 MG tablet Take 10 mg by mouth 2 (two) times daily.  . metoprolol (LOPRESSOR) 50 MG  tablet Take 0.5 tablets (25 mg total) by mouth 2 (two) times daily.  . QUEtiapine (SEROQUEL) 50 MG tablet TAKE 1 TABLET BY MOUTH IN THE MORNING AND 2 TABLETS AT BEDTIME     Review of Systems  Constitutional: Negative for fever, chills, diaphoresis and unexpected weight change.  HENT: Negative for congestion, hearing loss, rhinorrhea, sore throat and trouble swallowing.   Respiratory: Negative for cough, chest tightness, shortness of breath and wheezing.   Gastrointestinal: Negative for  nausea, vomiting, abdominal pain, diarrhea, constipation and abdominal distention.  Endocrine: Negative for cold intolerance and heat intolerance.  Genitourinary: Negative for dysuria, hematuria and flank pain.  Musculoskeletal: Negative for joint swelling and arthralgias.  Skin: Negative for rash.  Neurological: Positive for weakness. Negative for dizziness and headaches.  Psychiatric/Behavioral: Negative for dysphoric mood, decreased concentration and agitation. The patient is not nervous/anxious.        Objective:   Physical Exam  Constitutional: He is oriented to person, place, and time. He appears well-developed and well-nourished. No distress.  HENT:  Head: Normocephalic and atraumatic.  Right Ear: External ear normal.  Left Ear: External ear normal.  Nose: Nose normal.  Mouth/Throat: Oropharynx is clear and moist.  Eyes: Conjunctivae and EOM are normal. Pupils are equal, round, and reactive to light.  Neck: Normal range of motion. Neck supple. No thyromegaly present.  Cardiovascular: Normal rate, regular rhythm and normal heart sounds.   No murmur heard. Pulmonary/Chest: Effort normal and breath sounds normal. No respiratory distress. He has no wheezes. He has no rales.  Abdominal: Soft. Bowel sounds are normal. He exhibits no distension. There is no tenderness.  Lymphadenopathy:    He has no cervical adenopathy.  Neurological: He is alert and oriented to person, place, and time. He has normal reflexes. He exhibits abnormal muscle tone (marked tremor on the right to lesser degree on the left of both upper extremities. This appears to be an intention tremor). Coordination abnormal.  Skin: Skin is warm and dry.  Late stage blisters with calluses beneath approximately 1 cm each at the base of the third digit on the left and fourth digit on the right. Palmar surface of each hand  Psychiatric: He has a normal mood and affect. His behavior is normal. Judgment and thought content  normal.    BP 158/78 mmHg  Pulse 100  Temp(Src) 97.2 F (36.2 C) (Oral)  Ht  (1.803 m)  Wt 217 lb (98.431 kg)  BMI 30.28 kg/m2       Assessment & Plan:   1. Tremor of both hands   2. Hemiplegia, dominant side S/P CVA (cerebrovascular accident)     Meds ordered this encounter  Medications  . carbidopa-levodopa (SINEMET) 10-100 MG per tablet    Sig: Take 1 tablet by mouth 3 (three) times daily. For tremor    Dispense:  90 tablet    Refill:  5    No orders of the defined types were placed in this encounter.    Labs pending Health Maintenance reviewed Diet and exercise encouraged Continue all meds as discussed Follow up in 6 weeks  Mechele Claude, MD

## 2014-06-20 ENCOUNTER — Telehealth: Payer: Self-pay | Admitting: Family Medicine

## 2014-06-22 ENCOUNTER — Encounter: Payer: Self-pay | Admitting: *Deleted

## 2014-06-22 ENCOUNTER — Other Ambulatory Visit: Payer: Self-pay | Admitting: *Deleted

## 2014-06-22 MED ORDER — QUETIAPINE FUMARATE 50 MG PO TABS: ORAL_TABLET | ORAL | Status: DC

## 2014-06-30 ENCOUNTER — Encounter (HOSPITAL_COMMUNITY): Payer: Self-pay | Admitting: Emergency Medicine

## 2014-06-30 ENCOUNTER — Emergency Department (HOSPITAL_COMMUNITY)
Admission: EM | Admit: 2014-06-30 | Discharge: 2014-06-30 | Disposition: A | Discharge status: Home / Self Care | Payer: Medicare Other | Attending: Emergency Medicine | Admitting: Emergency Medicine

## 2014-06-30 DIAGNOSIS — N390 Urinary tract infection, site not specified: Secondary | ICD-10-CM

## 2014-06-30 DIAGNOSIS — I10 Essential (primary) hypertension: Secondary | ICD-10-CM | POA: Insufficient documentation

## 2014-06-30 DIAGNOSIS — E785 Hyperlipidemia, unspecified: Secondary | ICD-10-CM | POA: Diagnosis not present

## 2014-06-30 DIAGNOSIS — N289 Disorder of kidney and ureter, unspecified: Secondary | ICD-10-CM | POA: Diagnosis not present

## 2014-06-30 DIAGNOSIS — D649 Anemia, unspecified: Secondary | ICD-10-CM | POA: Diagnosis not present

## 2014-06-30 DIAGNOSIS — F0391 Unspecified dementia with behavioral disturbance: Secondary | ICD-10-CM | POA: Diagnosis not present

## 2014-06-30 DIAGNOSIS — Z87891 Personal history of nicotine dependence: Secondary | ICD-10-CM | POA: Diagnosis not present

## 2014-06-30 DIAGNOSIS — Z951 Presence of aortocoronary bypass graft: Secondary | ICD-10-CM | POA: Diagnosis not present

## 2014-06-30 DIAGNOSIS — Z8719 Personal history of other diseases of the digestive system: Secondary | ICD-10-CM | POA: Insufficient documentation

## 2014-06-30 DIAGNOSIS — E119 Type 2 diabetes mellitus without complications: Secondary | ICD-10-CM | POA: Insufficient documentation

## 2014-06-30 DIAGNOSIS — Z8701 Personal history of pneumonia (recurrent): Secondary | ICD-10-CM | POA: Diagnosis not present

## 2014-06-30 DIAGNOSIS — Z79899 Other long term (current) drug therapy: Secondary | ICD-10-CM | POA: Diagnosis not present

## 2014-06-30 DIAGNOSIS — M109 Gout, unspecified: Secondary | ICD-10-CM | POA: Insufficient documentation

## 2014-06-30 DIAGNOSIS — Z7982 Long term (current) use of aspirin: Secondary | ICD-10-CM | POA: Insufficient documentation

## 2014-06-30 DIAGNOSIS — Z9889 Other specified postprocedural states: Secondary | ICD-10-CM | POA: Diagnosis not present

## 2014-06-30 DIAGNOSIS — R350 Frequency of micturition: Secondary | ICD-10-CM | POA: Diagnosis present

## 2014-06-30 DIAGNOSIS — G8929 Other chronic pain: Secondary | ICD-10-CM | POA: Diagnosis not present

## 2014-06-30 DIAGNOSIS — I2510 Atherosclerotic heart disease of native coronary artery without angina pectoris: Secondary | ICD-10-CM | POA: Insufficient documentation

## 2014-06-30 DIAGNOSIS — Z8673 Personal history of transient ischemic attack (TIA), and cerebral infarction without residual deficits: Secondary | ICD-10-CM | POA: Diagnosis not present

## 2014-06-30 DIAGNOSIS — Z7902 Long term (current) use of antithrombotics/antiplatelets: Secondary | ICD-10-CM | POA: Diagnosis not present

## 2014-06-30 LAB — CBC
HCT: 33.4 % — ABNORMAL LOW (ref 39.0–52.0)
Hemoglobin: 10.8 g/dL — ABNORMAL LOW (ref 13.0–17.0)
MCH: 31.1 pg (ref 26.0–34.0)
MCHC: 32.3 g/dL (ref 30.0–36.0)
MCV: 96.3 fL (ref 78.0–100.0)
PLATELETS: 211 10*3/uL (ref 150–400)
RBC: 3.47 MIL/uL — AB (ref 4.22–5.81)
RDW: 15.4 % (ref 11.5–15.5)
WBC: 11.7 10*3/uL — ABNORMAL HIGH (ref 4.0–10.5)

## 2014-06-30 LAB — COMPREHENSIVE METABOLIC PANEL
ALT: 27 U/L (ref 0–53)
ANION GAP: 7 (ref 5–15)
AST: 18 U/L (ref 0–37)
Albumin: 3.8 g/dL (ref 3.5–5.2)
Alkaline Phosphatase: 61 U/L (ref 39–117)
BILIRUBIN TOTAL: 0.7 mg/dL (ref 0.3–1.2)
BUN: 18 mg/dL (ref 6–23)
CHLORIDE: 103 mmol/L (ref 96–112)
CO2: 25 mmol/L (ref 19–32)
CREATININE: 1.43 mg/dL — AB (ref 0.50–1.35)
Calcium: 8.1 mg/dL — ABNORMAL LOW (ref 8.4–10.5)
GFR calc Af Amer: 52 mL/min — ABNORMAL LOW (ref >90)
GFR calc non Af Amer: 45 mL/min — ABNORMAL LOW (ref >90)
Glucose, Bld: 154 mg/dL — ABNORMAL HIGH (ref 70–99)
POTASSIUM: 3.5 mmol/L (ref 3.5–5.1)
Sodium: 135 mmol/L (ref 135–145)
TOTAL PROTEIN: 7.2 g/dL (ref 6.0–8.3)

## 2014-06-30 LAB — URINALYSIS, ROUTINE W REFLEX MICROSCOPIC
BILIRUBIN URINE: NEGATIVE
Glucose, UA: NEGATIVE mg/dL
KETONES UR: NEGATIVE mg/dL
NITRITE: POSITIVE — AB
Protein, ur: 30 mg/dL — AB
SPECIFIC GRAVITY, URINE: 1.011 (ref 1.005–1.030)
UROBILINOGEN UA: 0.2 mg/dL (ref 0.0–1.0)
pH: 6 (ref 5.0–8.0)

## 2014-06-30 LAB — CBG MONITORING, ED: Glucose-Capillary: 138 mg/dL — ABNORMAL HIGH (ref 70–99)

## 2014-06-30 LAB — URINE MICROSCOPIC-ADD ON

## 2014-06-30 MED ORDER — DEXTROSE 5 % IV SOLN
1.0000 g | INTRAVENOUS | Status: AC
  Administered 2014-06-30: 1 g via INTRAVENOUS
  Filled 2014-06-30: qty 10

## 2014-06-30 MED ORDER — CEPHALEXIN 500 MG PO CAPS: 500.0000 mg | ORAL_CAPSULE | Freq: Four times a day (QID) | ORAL | Status: DC

## 2014-06-30 NOTE — ED Notes (Signed)
Per pt/family-has BPH and just had a month old catheter removed-did not test urine-[facility] has [facility] UTI and possible bladder/kidney infection-dysuria

## 2014-06-30 NOTE — Discharge Instructions (Signed)
Your urine sample shows that you have a urinary infection - your kidney function is close to baseline - please have your urine sample rechecked in one week to make sure that the infection is going away.  Please call your doctor for a followup appointment within 24-48 hours. When you talk to your doctor please let them know that you were seen in the emergency department and have them acquire all of your records so that they can discuss the findings with you and formulate a treatment plan to fully care for your new and ongoing problems.

## 2014-06-30 NOTE — ED Provider Notes (Signed)
CSN: [id number]     Arrival date & time 06/30/14  1302 History   First MD Initiated Contact with Patient 06/30/14 1328     Chief Complaint  Patient presents with  . Urinary Tract Infection     (Consider location/radiation/quality/duration/timing/severity/associated sxs/prior Treatment) HPI Comments: 81 year old male, history of benign prostatic hypertrophy, recently required urinary catheterization with Foley catheter placement for over 1 month, it was discontinued, he failed outpatient therapy and had to have it replaced, it was in for another week and then removed yesterday, he reports that he did urinate yesterday afternoon but since that time his head increasing pain with urination as well as low back pain. He denies fevers chills nausea or vomiting, he states that he has a full sensation in his lower abdomen and associated burning in his penis. He denies any other symptoms including chest pain coughing shortness of breath or swelling of the lower extremities. Family reports that he had 2 urinary infections while he had the catheter  Patient is a 81 y.o. male presenting with urinary tract infection. The history is provided by the patient and a relative.  Urinary Tract Infection    Past Medical History  Diagnosis Date  . Diabetes mellitus type 2   . GERD (gastroesophageal reflux disease)   . Hyperlipidemia     takes Niacin nightly  . DJD (degenerative joint disease)   . Hypertension     takes Metoprolol and Ramipril daily  . Dizziness   . Gout     hx of--47yrs ago  . Bruises easily     pt is on Plavix and ASA  . Shortness of breath     wakes pt up during night with occ shortness of breathe;states it happens about once a month  . Impaired hearing   . Urinary frequency   . UTI (lower urinary tract infection)     hx of--03/2010  . Anemia     takes Glyburide bid  . CAD (coronary artery disease) of artery bypass graft   . Pneumonia     "at least twice that I know of"  . Seizures  05/14/11    "first time was today"  . CVA (cerebral vascular accident) ~ 2009    right arm/leg occ hurts  . Carotid artery occlusion 2013    s/p bilateral endarterectomy  . Chronic headache   . Chronic neck pain   . Dementia with behavioral disturbance   . Dyspnea     chronic, daily   Past Surgical History  Procedure Laterality Date  . Appendectomy  1968  . Colonoscopy    . Endarterectomy  04/09/2011    Procedure: ENDARTERECTOMY CAROTID;  Surgeon: Chuck Hint, MD;  Location: [facility] OR;  Service: Vascular;  Laterality: Left;  Left Carotid endarterectomy With Dacron Patch Angioplasty  . Cardiac catheterization  1998    Dr. Antoine Poche  . Endarterectomy  05/08/2011    Procedure: ENDARTERECTOMY CAROTID;  Surgeon: Chuck Hint, MD;  Location: [facility] OR;  Service: Vascular;  Laterality: Right;  with Heamashield Patch Angioplasty  . Cataract extraction w/ intraocular lens  implant, bilateral  2012  . Coronary artery bypass graft  1998    LIMA to LAD, SVG to diagonal, SVG to PDA, SVG to posterior lateral.   . Eye surgery  1953    right ; "piece of metal removed"  . Eye surgery  2012    Cataract- Bilateral   Family History  Problem Relation Age of Onset  . Diabetes Sister   .  Cancer Brother   . Diabetes Brother   . Anesthesia problems Neg Hx   . Hypotension Neg Hx   . Malignant hyperthermia Neg Hx   . Pseudochol deficiency Neg Hx   . Hearing loss Father    History  Substance Use Topics  . Smoking status: Former Smoker -- 1.00 packs/day for 20 years    Types: Cigarettes    Quit date: 03/02/1974  . Smokeless tobacco: Never Used  . Alcohol Use: No     Comment: quit in 1981    Review of Systems  All other systems reviewed and are negative.     Allergies  Fexofenadine hcl; Flonase; Nasal spray; and Nasal spray  Home Medications   Prior to Admission medications   Medication Sig Start Date End Date Taking? Authorizing Provider  albuterol (PROVENTIL) (2.5 MG/3ML)  0.083% nebulizer solution Take 3 mLs (2.5 mg total) by nebulization every 6 (six) hours as needed for wheezing or shortness of breath. 05/27/14  Yes Erick BlinksJehanzeb Memon, MD  allopurinol (ZYLOPRIM) 300 MG tablet Take 1 tablet (300 mg total) by mouth daily. 06/13/14  Yes Frederica KusterStephen M Doralyn Kirkes, MD  aspirin EC 81 MG tablet Take 81 mg by mouth 2 (two) times daily.   Yes Historical Provider, MD  clopidogrel (PLAVIX) 75 MG tablet Take 75 mg by mouth daily.   Yes Historical Provider, MD  finasteride (PROSCAR) 5 MG tablet Take 1 tablet (5 mg total) by mouth daily. 06/02/14  Yes Standley Brookinganiel P Goodrich, MD  furosemide (LASIX) 20 MG tablet Take 20 mg by mouth daily as needed for fluid.   Yes Historical Provider, MD  glyBURIDE (DIABETA) 2.5 MG tablet Take 2.5 mg by mouth 2 (two) times daily with a meal.   Yes Historical Provider, MD  ibuprofen (ADVIL,MOTRIN) 200 MG tablet Take 200 mg by mouth every 6 (six) hours as needed for mild pain.   Yes Historical Provider, MD  LORazepam (ATIVAN) 0.5 MG tablet 1-2 tablets BID PRN and 4 tablets at bedtime 06/13/14  Yes Frederica KusterStephen M Tahj Njoku, MD  memantine (NAMENDA) 10 MG tablet Take 10 mg by mouth 2 (two) times daily.   Yes Historical Provider, MD  metoprolol (LOPRESSOR) 50 MG tablet Take 0.5 tablets (25 mg total) by mouth 2 (two) times daily. 06/02/14  Yes Standley Brookinganiel P Goodrich, MD  QUEtiapine (SEROQUEL) 50 MG tablet Take 2 tablets in the morning and 3 at bedtime 06/22/14  Yes Frederica KusterStephen M Darsh Vandevoort, MD  carbidopa-levodopa (SINEMET) 10-100 MG per tablet Take 1 tablet by mouth 3 (three) times daily. For tremor Patient not taking: Reported on 06/30/2014 06/18/14   Mechele ClaudeWarren Stacks, MD  cephALEXin (KEFLEX) 500 MG capsule Take 1 capsule (500 mg total) by mouth 4 (four) times daily. 06/30/14   Eber HongBrian Giselle Brutus, MD  iron polysaccharides (NIFEREX) 150 MG capsule Take 1 capsule (150 mg total) by mouth 2 (two) times daily. Patient not taking: Reported on 06/30/2014 05/07/14   Gwenyth BenderKaren M Black, NP   BP 129/95 mmHg  Pulse 107   Temp(Src) 98.2 F (36.8 C) (Oral)  Resp 18  SpO2 97% Physical Exam  Constitutional: He appears well-developed and well-nourished. No distress.  HENT:  Head: Normocephalic and atraumatic.  Mouth/Throat: Oropharynx is clear and moist. No oropharyngeal exudate.  Eyes: Conjunctivae and EOM are normal. Pupils are equal, round, and reactive to light. Right eye exhibits no discharge. Left eye exhibits no discharge. No scleral icterus.  Neck: Normal range of motion. Neck supple. No JVD present. No thyromegaly present.  Cardiovascular: Normal  rate, regular rhythm, normal heart sounds and intact distal pulses.  Exam reveals no gallop and no friction rub.   No murmur heard. Pulmonary/Chest: Effort normal and breath sounds normal. No respiratory distress. He has no wheezes. He has no rales.  Abdominal: Soft. Bowel sounds are normal. He exhibits no distension and no mass. There is no tenderness.  Obese, nontender abdomen  Genitourinary:  Normal appearing uncircumcised penis, no swelling drainage redness or bleeding  Musculoskeletal: Normal range of motion. He exhibits no edema or tenderness.  Nontender back  Lymphadenopathy:    He has no cervical adenopathy.  Neurological: He is alert. Coordination normal.  Skin: Skin is warm and dry. No rash noted. No erythema.  Psychiatric: He has a normal mood and affect. His behavior is normal.  Nursing note and vitals reviewed.   ED Course  Procedures (including critical care time) Labs Review Labs Reviewed  CBC - Abnormal; Notable for the following:    WBC 11.7 (*)    RBC 3.47 (*)    Hemoglobin 10.8 (*)    HCT 33.4 (*)    All other components within normal limits  COMPREHENSIVE METABOLIC PANEL - Abnormal; Notable for the following:    Glucose, Bld 154 (*)    Creatinine, Ser 1.43 (*)    Calcium 8.1 (*)    GFR calc non Af Amer 45 (*)    GFR calc Af Amer 52 (*)    All other components within normal limits  URINALYSIS, ROUTINE W REFLEX MICROSCOPIC -  Abnormal; Notable for the following:    APPearance TURBID (*)    Hgb urine dipstick MODERATE (*)    Protein, ur 30 (*)    Nitrite POSITIVE (*)    Leukocytes, UA LARGE (*)    All other components within normal limits  CBG MONITORING, ED - Abnormal; Notable for the following:    Glucose-Capillary 138 (*)    All other components within normal limits  URINE CULTURE  URINE MICROSCOPIC-ADD ON    Imaging Review No results found.    MDM   Final diagnoses:  UTI (lower urinary tract infection)  Renal insufficiency    The patient is not tachycardic on my exam, his blood pressure is slightly elevated, there is no fever. We'll check for urinary tract infection, renal function, labs and urinalysis pending. May need Foley catheter replaced if unable to urinate spontaneously  Pt gave spontaneous urine sample - has no systemic sx, I reevaluated at 3:46 - has no tachycardia, normal BP and no fever.  Labs show WBC of 11.7, Cr of 1.4 (baseline 1.2), and UA with TNTC WBC's and positive nitrite and LE's.  He will have culture performed, IV Rocephin and d/c home - no n/v - family has been informed and are in agreement with the plan.  Meds given in ED:  Medications  cefTRIAXone (ROCEPHIN) 1 g in dextrose 5 % 50 mL IVPB (not administered)    New Prescriptions   CEPHALEXIN (KEFLEX) 500 MG CAPSULE    Take 1 capsule (500 mg total) by mouth 4 (four) times daily.      Eber Hong, MD 06/30/14 [phone]

## 2014-07-02 LAB — URINE CULTURE: Colony Count: >=100000

## 2014-07-03 ENCOUNTER — Telehealth (HOSPITAL_BASED_OUTPATIENT_CLINIC_OR_DEPARTMENT_OTHER): Payer: Self-pay | Admitting: Emergency Medicine

## 2014-07-03 NOTE — Telephone Encounter (Signed)
Post ED Visit - Positive Culture Follow-up  Culture report reviewed by antimicrobial stewardship pharmacist: []  Wes Dulaney, Pharm.D., BCPS [x]  Celedonio MiyamotoJeremy Frens, Pharm.D., BCPS []  Georgina PillionElizabeth Martin, Pharm.D., BCPS []  Marble RockMinh Pham, [address]Ph[address]D., BCPS, AAHIVP []  Estella HuskMichelle Turner, Pharm.D., BCPS, AAHIVP []  Elder CyphersLorie Poole, [address]Pharm[address]D., BCPS  Positive urine  Culture E. coli Treated with cephalexin, organism sensitive to the same and no further patient follow-up is required at this time.  Berle MullMiller, Marygrace Sandoval 07/03/2014, 9:07 AM

## 2014-07-11 ENCOUNTER — Encounter: Payer: Self-pay | Admitting: Family Medicine

## 2014-07-11 ENCOUNTER — Ambulatory Visit (INDEPENDENT_AMBULATORY_CARE_PROVIDER_SITE_OTHER): Payer: Medicare Other | Admitting: Family Medicine

## 2014-07-11 VITALS — BP 132/67 | HR 41 | Temp 97.0°F | Ht 71.0 in | Wt 213.0 lb

## 2014-07-11 DIAGNOSIS — E089 Diabetes mellitus due to underlying condition without complications: Secondary | ICD-10-CM | POA: Diagnosis not present

## 2014-07-11 DIAGNOSIS — I6523 Occlusion and stenosis of bilateral carotid arteries: Secondary | ICD-10-CM | POA: Diagnosis not present

## 2014-07-11 DIAGNOSIS — N390 Urinary tract infection, site not specified: Secondary | ICD-10-CM

## 2014-07-11 LAB — POCT URINALYSIS DIPSTICK
BILIRUBIN UA: NEGATIVE
Blood, UA: NEGATIVE
Glucose, UA: NEGATIVE
Ketones, UA: NEGATIVE
LEUKOCYTES UA: NEGATIVE
Nitrite, UA: NEGATIVE
PROTEIN UA: NEGATIVE
Spec Grav, UA: 1.01
Urobilinogen, UA: NEGATIVE
pH, UA: 6

## 2014-07-11 LAB — POCT UA - MICROALBUMIN: MICROALBUMIN (UR) POC: NEGATIVE mg/L

## 2014-07-11 MED ORDER — FINASTERIDE 5 MG PO TABS: 5.0000 mg | ORAL_TABLET | Freq: Every day | ORAL | Status: DC

## 2014-07-11 MED ORDER — LORAZEPAM 0.5 MG PO TABS: 0.5000 mg | ORAL_TABLET | Freq: Two times a day (BID) | ORAL | Status: DC

## 2014-07-11 MED ORDER — ALLOPURINOL 300 MG PO TABS: 300.0000 mg | ORAL_TABLET | Freq: Every day | ORAL | Status: DC

## 2014-07-11 MED ORDER — QUETIAPINE FUMARATE 50 MG PO TABS: ORAL_TABLET | ORAL | Status: DC

## 2014-07-11 NOTE — Progress Notes (Signed)
Subjective:    Patient ID: [name] JarvisSylvester H [name], male    DOB: [birth year], 81 y.o.   MRN: [id number]  HPI 81 year old here to follow-up dementia with behavioral disturbance, urinary tract infection,. Since his last visit here his catheter was removed but it was followed by a urinary tract infection. That has now been treated and we want to do a urinalysis today to see if there is clear.  Since his last visit here his daughter in law who does most of his care taking came by to ask about medications. He had been started on Sinemet for Parkinson-like symptoms but I suggested they hold off on that feeling that tremor might be attributable to his antipsychotic medicine, Seroquel. Since we did not begin Sinemet his tremor is no worse and is probably better. We did increase the Seroquel due to behavior difficulties and hostility within the home especially toward caregivers. That seems to have mellowed him a bit. It is difficult to tell with his medications if it's drowsiness or it's truly helping anxiety.  Patient Active Problem List   Diagnosis Date Noted  . Acute urinary retention 05/30/2014  . Hematuria 05/29/2014  . Severe sepsis 05/26/2014  . Elevated lactic acid level 05/26/2014  . Hypokalemia 05/26/2014  . HCAP (healthcare-associated pneumonia) 05/25/2014  . Sepsis 05/25/2014  . Acute gout   . Encephalopathy acute 05/06/2014  . UTI (lower urinary tract infection) 05/06/2014  . Acute renal insufficiency 05/06/2014  . Dehydration 05/06/2014  . FTT (failure to thrive) in adult 05/06/2014  . Gout flare 05/06/2014  . Acute respiratory failure with hypoxia 05/06/2014  . Pyrexia   . Renal insufficiency   . Diabetes mellitus due to underlying condition without complications   . COPD exacerbation 04/11/2014  . Pain in the chest   . Dyspnea 04/10/2014  . Chest pain 04/10/2014  . Chronic cough 04/10/2014  . Essential hypertension 04/10/2014  . Carotid artery occlusion 04/10/2014  . CVA  (cerebral vascular accident) 04/10/2014  . Acute bronchospasm 04/10/2014  . Anemia 04/10/2014  . Dementia with behavioral disturbance 11/15/2013  . Cough 11/15/2013  . Benign paroxysmal positional vertigo 01/30/2013  . Adjustment reaction of adult life 01/30/2013  . Need for prophylactic vaccination and inoculation against influenza 12/09/2012  . Calcaneal fracture 09/09/2012  . Seizure 05/14/2011  . Hypotension, unspecified 05/14/2011  . Seizures 05/14/2011  . Preop cardiovascular exam 03/17/2011  . Diabetes mellitus   . CAD (coronary artery disease) of artery bypass graft   . Hyperlipidemia   . Hypertension   . Peripheral vascular disease   . Occlusion and stenosis of carotid artery without mention of cerebral infarction 02/11/2011   Outpatient Encounter Prescriptions as of 07/11/2014  Medication Sig  . albuterol (PROVENTIL) (2.5 MG/3ML) 0.083% nebulizer solution Take 3 mLs (2.5 mg total) by nebulization every 6 (six) hours as needed for wheezing or shortness of breath.  . allopurinol (ZYLOPRIM) 300 MG tablet Take 1 tablet (300 mg total) by mouth daily.  Marland Kitchen. aspirin EC 81 MG tablet Take 81 mg by mouth 2 (two) times daily.  . finasteride (PROSCAR) 5 MG tablet Take 1 tablet (5 mg total) by mouth daily.  . furosemide (LASIX) 20 MG tablet Take 20 mg by mouth daily as needed for fluid.  Marland Kitchen. glyBURIDE (DIABETA) 2.5 MG tablet Take 2.5 mg by mouth 2 (two) times daily with a meal.  . ibuprofen (ADVIL,MOTRIN) 200 MG tablet Take 200 mg by mouth every 6 (six) hours as needed for mild pain.  .Marland Kitchen  iron polysaccharides (NIFEREX) 150 MG capsule Take 1 capsule (150 mg total) by mouth 2 (two) times daily.  Marland Kitchen. LORazepam (ATIVAN) 0.5 MG tablet 1-2 tablets BID PRN and 4 tablets at bedtime  . memantine (NAMENDA) 10 MG tablet Take 10 mg by mouth 2 (two) times daily.  . metoprolol (LOPRESSOR) 50 MG tablet Take 0.5 tablets (25 mg total) by mouth 2 (two) times daily.  . QUEtiapine (SEROQUEL) 50 MG tablet Take 2  tablets in the morning and 3 at bedtime  . [DISCONTINUED] carbidopa-levodopa (SINEMET) 10-100 MG per tablet Take 1 tablet by mouth 3 (three) times daily. For tremor (Patient not taking: Reported on 06/30/2014)  . [DISCONTINUED] cephALEXin (KEFLEX) 500 MG capsule Take 1 capsule (500 mg total) by mouth 4 (four) times daily.  . [DISCONTINUED] clopidogrel (PLAVIX) 75 MG tablet Take 75 mg by mouth daily.   No facility-administered encounter medications on file as of 07/11/2014.      Review of Systems  Respiratory: Negative.   Cardiovascular: Negative.   Genitourinary: Positive for difficulty urinating.  Psychiatric/Behavioral: Positive for behavioral problems and decreased concentration.       Objective:   Physical Exam  Constitutional: He is oriented to person, place, and time. He appears well-developed and well-nourished.  Cardiovascular: Regular rhythm.   Pulmonary/Chest: Effort normal and breath sounds normal.  Abdominal: Soft. He exhibits distension. There is no tenderness.  Neurological: He is alert and oriented to person, place, and time.   BP 132/67 mmHg  Pulse 41  Temp(Src) 97 F (36.1 C) (Oral)  Ht 5\' 11"  (1.803 m)  Wt 213 lb (96.616 kg)  BMI 29.72 kg/m2        Assessment & Plan:  1. Diabetes mellitus due to underlying condition without complications Diabetes has actually been well managed well controlled and his more or less least of his issues and problems at this point - POCT UA - Microalbumin  2. UTI (lower urinary tract infection) Most recent infection probably due to catheter placement will just check urine today to be sure that's clear; urinalysis is normal. Encouraged lots of fluids going forward  Frederica KusterStephen M Miller MD - POCT urinalysis dipstick

## 2014-07-18 ENCOUNTER — Telehealth: Payer: Self-pay | Admitting: Family Medicine

## 2014-07-19 MED ORDER — FUROSEMIDE 20 MG PO TABS: 20.0000 mg | ORAL_TABLET | Freq: Every day | ORAL | Status: DC | PRN

## 2014-07-19 NOTE — Telephone Encounter (Signed)
done

## 2014-07-21 ENCOUNTER — Other Ambulatory Visit: Payer: Self-pay | Admitting: Family Medicine

## 2014-07-25 ENCOUNTER — Telehealth: Payer: Self-pay | Admitting: Family Medicine

## 2014-07-26 NOTE — Telephone Encounter (Signed)
Done 07/19/14

## 2014-07-28 ENCOUNTER — Other Ambulatory Visit: Payer: Self-pay | Admitting: Family Medicine

## 2014-08-08 ENCOUNTER — Ambulatory Visit (INDEPENDENT_AMBULATORY_CARE_PROVIDER_SITE_OTHER): Payer: Medicare Other | Admitting: Cardiology

## 2014-08-08 ENCOUNTER — Encounter: Payer: Self-pay | Admitting: Cardiology

## 2014-08-08 VITALS — BP 128/76 | HR 88 | Ht 71.0 in | Wt 216.0 lb

## 2014-08-08 DIAGNOSIS — I6523 Occlusion and stenosis of bilateral carotid arteries: Secondary | ICD-10-CM | POA: Diagnosis not present

## 2014-08-08 DIAGNOSIS — I25810 Atherosclerosis of coronary artery bypass graft(s) without angina pectoris: Secondary | ICD-10-CM

## 2014-08-08 NOTE — Progress Notes (Signed)
HPI The patient presents for followup of his coronary disease. At the last visit he was having some chest discomfort. I sent him for a stress perfusion study which was low risk with a very mildly reduced ejection fraction unchanged from previous.  Since that time he was hospitalized with pneumonia. He had a urinary infection and was in the emergency room. However, he's had no cardiovascular complaints.  The patient denies any new symptoms such as chest discomfort, neck or arm discomfort. There has been no new shortness of breath, PND or orthopnea. There have been no reported palpitations, presyncope or syncope.  He does get a little short of breath working in his lower but this is baseline.  Allergies  Allergen Reactions  . Fexofenadine Hcl Other (See Comments)    Heart attack symptoms  . Flonase [Fluticasone Propionate] Other (See Comments)    Heart attack symptoms  . Nasal Spray Other (See Comments)    Heart attack symptoms   . Nasal Spray     CAN NOT TOLERATE ANY NASAL SPRAYS!!!!!  Causes heart attack symptoms    Current Outpatient Prescriptions  Medication Sig Dispense Refill  . albuterol (PROVENTIL) (2.5 MG/3ML) 0.083% nebulizer solution Take 3 mLs (2.5 mg total) by nebulization every 6 (six) hours as needed for wheezing or shortness of breath. 75 mL 12  . allopurinol (ZYLOPRIM) 300 MG tablet Take 1 tablet (300 mg total) by mouth daily. 90 tablet 1  . aspirin EC 81 MG tablet Take 81 mg by mouth 2 (two) times daily.    . clopidogrel (PLAVIX) 75 MG tablet Take 75 mg by mouth daily.    Marland Kitchen. donepezil (ARICEPT) 10 MG tablet TAKE 1 TABLET AT BEDTIME 90 tablet 1  . finasteride (PROSCAR) 5 MG tablet Take 1 tablet (5 mg total) by mouth daily. 90 tablet 1  . furosemide (LASIX) 20 MG tablet Take 1 tablet (20 mg total) by mouth daily as needed for fluid. 90 tablet 1  . glyBURIDE (DIABETA) 2.5 MG tablet Take 2.5 mg by mouth 2 (two) times daily with a meal.    . ibuprofen (ADVIL,MOTRIN) 200 MG  tablet Take 200 mg by mouth every 6 (six) hours as needed for mild pain.    Marland Kitchen. LORazepam (ATIVAN) 0.5 MG tablet Take 1 tablet (0.5 mg total) by mouth 2 (two) times daily. 180 tablet 0  . memantine (NAMENDA) 10 MG tablet Take 10 mg by mouth 2 (two) times daily.    . metoprolol (LOPRESSOR) 50 MG tablet Take 0.5 tablets (25 mg total) by mouth 2 (two) times daily.    . QUEtiapine (SEROQUEL) 50 MG tablet TAKE 2 TABLETS IN THE MORNING AND 3 AT BEDTIME 150 tablet 1   No current facility-administered medications for this visit.    Past Medical History  Diagnosis Date  . Diabetes mellitus type 2   . GERD (gastroesophageal reflux disease)   . Hyperlipidemia     takes Niacin nightly  . DJD (degenerative joint disease)   . Hypertension     takes Metoprolol and Ramipril daily  . Dizziness   . Gout     hx of--2828yrs ago  . Bruises easily     pt is on Plavix and ASA  . Shortness of breath     wakes pt up during night with occ shortness of breathe;states it happens about once a month  . Impaired hearing   . Urinary frequency   . UTI (lower urinary tract infection)  hx of--03/2010  . Anemia     takes Glyburide bid  . CAD (coronary artery disease) of artery bypass graft   . Pneumonia     "at least twice that I know of"  . Seizures 05/14/11    "first time was today"  . CVA (cerebral vascular accident) ~ 2009    right arm/leg occ hurts  . Carotid artery occlusion 2013    s/p bilateral endarterectomy  . Chronic headache   . Chronic neck pain   . Dementia with behavioral disturbance   . Dyspnea     chronic, daily    Past Surgical History  Procedure Laterality Date  . Appendectomy  1968  . Colonoscopy    . Endarterectomy  04/09/2011    Procedure: ENDARTERECTOMY CAROTID;  Surgeon: Chuck Hint, MD;  Location: [facility] OR;  Service: Vascular;  Laterality: Left;  Left Carotid endarterectomy With Dacron Patch Angioplasty  . Cardiac catheterization  1998    Dr. Antoine Poche  . Endarterectomy   05/08/2011    Procedure: ENDARTERECTOMY CAROTID;  Surgeon: Chuck Hint, MD;  Location: [facility] OR;  Service: Vascular;  Laterality: Right;  with Heamashield Patch Angioplasty  . Cataract extraction w/ intraocular lens  implant, bilateral  2012  . Coronary artery bypass graft  1998    LIMA to LAD, SVG to diagonal, SVG to PDA, SVG to posterior lateral.   . Eye surgery  1953    right ; "piece of metal removed"  . Eye surgery  2012    Cataract- Bilateral    ROS:  As stated in the HPI and negative for all other systems.   PHYSICAL EXAM BP 128/76 mmHg  Pulse 88  Ht  (1.803 m)  Wt 216 lb (97.977 kg)  BMI 30.14 kg/m2 GENERAL:  Well appearing HEENT:  Pupils equal round and reactive, fundi not visualized, oral mucosa unremarkable, well healed endarterectomy scars NECK:  No jugular venous distention, waveform within normal limits, carotid upstroke brisk and symmetric, bilatral bruits, no thyromegaly LYMPHATICS:  No cervical, inguinal adenopathy LUNGS:  Clear to auscultation bilaterally BACK:  No CVA tenderness CHEST:  Well healed sternotomy scar. HEART:  PMI not displaced or sustained,S1 and S2 within normal limits, no S3, no S4, no clicks, no rubs, no murmurs ABD:  Flat, positive bowel sounds normal in frequency in pitch, no bruits, no rebound, no guarding, no midline pulsatile mass, no hepatomegaly, no splenomegaly EXT:  2 plus pulses upper, absent DP/PT right and decreased on the left, no edema, no cyanosis no clubbing   ASSESSMENT AND PLAN  CAD (coronary artery disease) of artery bypass graft -  The patient has no new sypmtoms.  No further cardiovascular testing is indicated.  We will continue with aggressive risk reduction and meds as listed.  Hypertension -  His blood pressure is well controlled. He will continue the meds as listed.  Hyperlipidemia -  This is followed by Frederica Kuster, MD  Diabetes - His A1C was 7.9.  He and I discussed the importance of blood  sugar control.

## 2014-08-08 NOTE — Patient Instructions (Signed)
Medication Instructions:  Your physician recommends that you continue on your current medications as directed. Please refer to the Current Medication list given to you today.  Follow-Up: Follow up in 1 year with Dr. Hochrein in [place].  You will receive a letter in the mail 2 months before you are due.  Please call us when you receive this letter to schedule your follow up appointment.  Thank you for choosing [facility]!!       

## 2014-08-27 ENCOUNTER — Other Ambulatory Visit: Payer: Self-pay

## 2014-08-27 ENCOUNTER — Encounter: Payer: Self-pay | Admitting: *Deleted

## 2014-10-10 ENCOUNTER — Encounter: Payer: Self-pay | Admitting: Family Medicine

## 2014-10-10 ENCOUNTER — Ambulatory Visit (INDEPENDENT_AMBULATORY_CARE_PROVIDER_SITE_OTHER): Payer: Medicare Other | Admitting: Family Medicine

## 2014-10-10 VITALS — BP 122/52 | HR 41 | Temp 97.0°F | Ht 71.0 in | Wt 215.0 lb

## 2014-10-10 DIAGNOSIS — F0391 Unspecified dementia with behavioral disturbance: Secondary | ICD-10-CM

## 2014-10-10 DIAGNOSIS — E089 Diabetes mellitus due to underlying condition without complications: Secondary | ICD-10-CM | POA: Diagnosis not present

## 2014-10-10 DIAGNOSIS — I6523 Occlusion and stenosis of bilateral carotid arteries: Secondary | ICD-10-CM

## 2014-10-10 DIAGNOSIS — F03918 Unspecified dementia, unspecified severity, with other behavioral disturbance: Secondary | ICD-10-CM

## 2014-10-10 DIAGNOSIS — R32 Unspecified urinary incontinence: Secondary | ICD-10-CM

## 2014-10-10 DIAGNOSIS — E785 Hyperlipidemia, unspecified: Secondary | ICD-10-CM | POA: Diagnosis not present

## 2014-10-10 LAB — POCT URINALYSIS DIPSTICK
Bilirubin, UA: NEGATIVE
Blood, UA: NEGATIVE
GLUCOSE UA: NEGATIVE
Ketones, UA: NEGATIVE
LEUKOCYTES UA: NEGATIVE
Nitrite, UA: NEGATIVE
PROTEIN UA: NEGATIVE
Spec Grav, UA: 1.015
Urobilinogen, UA: NEGATIVE
pH, UA: 6

## 2014-10-10 LAB — POCT UA - MICROSCOPIC ONLY
BACTERIA, U MICROSCOPIC: NEGATIVE
Casts, Ur, LPF, POC: NEGATIVE
Crystals, Ur, HPF, POC: NEGATIVE
Mucus, UA: NEGATIVE
RBC, URINE, MICROSCOPIC: NEGATIVE
Yeast, UA: NEGATIVE

## 2014-10-10 LAB — POCT GLYCOSYLATED HEMOGLOBIN (HGB A1C): Hemoglobin A1C: 8.2

## 2014-10-10 MED ORDER — FUROSEMIDE 20 MG PO TABS: 20.0000 mg | ORAL_TABLET | Freq: Every day | ORAL | Status: DC | PRN

## 2014-10-10 MED ORDER — LORAZEPAM 0.5 MG PO TABS: 0.5000 mg | ORAL_TABLET | Freq: Two times a day (BID) | ORAL | Status: DC

## 2014-10-10 MED ORDER — QUETIAPINE FUMARATE 50 MG PO TABS: ORAL_TABLET | ORAL | Status: DC

## 2014-10-10 NOTE — Progress Notes (Signed)
Subjective:    Patient ID: [name], male    DOB: 16-Feb-1932, 81 y.o.   MRN: [id number]  HPI 81 year old gentleman with dementia, diabetes,. He is on fairly significant doses of Seroquel for behaviors as well as Namenda. Main concern now is not sleeping at night and still has some aggressive behaviors. The daughter-in-law who is his main caregiver also complains that he will not take baths.  Today his mentation is good he can remember what he ate for lunch yesterday and really is able to carry on a coherent conversation.  Patient Active Problem List   Diagnosis Date Noted  . Acute urinary retention 05/30/2014  . Hematuria 05/29/2014  . Severe sepsis 05/26/2014  . Elevated lactic acid level 05/26/2014  . Hypokalemia 05/26/2014  . HCAP (healthcare-associated pneumonia) 05/25/2014  . Sepsis 05/25/2014  . Acute gout   . Encephalopathy acute 05/06/2014  . UTI (lower urinary tract infection) 05/06/2014  . Acute renal insufficiency 05/06/2014  . Dehydration 05/06/2014  . FTT (failure to thrive) in adult 05/06/2014  . Gout flare 05/06/2014  . Acute respiratory failure with hypoxia 05/06/2014  . Pyrexia   . Renal insufficiency   . Diabetes mellitus due to underlying condition without complications   . COPD exacerbation 04/11/2014  . Pain in the chest   . Dyspnea 04/10/2014  . Chest pain 04/10/2014  . Chronic cough 04/10/2014  . Essential hypertension 04/10/2014  . Carotid artery occlusion 04/10/2014  . CVA (cerebral vascular accident) 04/10/2014  . Acute bronchospasm 04/10/2014  . Anemia 04/10/2014  . Dementia with behavioral disturbance 11/15/2013  . Cough 11/15/2013  . Benign paroxysmal positional vertigo 01/30/2013  . Adjustment reaction of adult life 01/30/2013  . Need for prophylactic vaccination and inoculation against influenza 12/09/2012  . Calcaneal fracture 09/09/2012  . Seizure 05/14/2011  . Hypotension, unspecified 05/14/2011  . Seizures 05/14/2011  .  Preop cardiovascular exam 03/17/2011  . Diabetes mellitus   . CAD (coronary artery disease) of artery bypass graft   . Hyperlipidemia   . Hypertension   . Peripheral vascular disease   . Occlusion and stenosis of carotid artery without mention of cerebral infarction 02/11/2011   Outpatient Encounter Prescriptions as of 10/10/2014  Medication Sig  . albuterol (PROVENTIL) (2.5 MG/3ML) 0.083% nebulizer solution Take 3 mLs (2.5 mg total) by nebulization every 6 (six) hours as needed for wheezing or shortness of breath.  . allopurinol (ZYLOPRIM) 300 MG tablet Take 1 tablet (300 mg total) by mouth daily.  Marland Kitchen aspirin EC 81 MG tablet Take 81 mg by mouth 2 (two) times daily.  . clopidogrel (PLAVIX) 75 MG tablet Take 75 mg by mouth daily.  Marland Kitchen donepezil (ARICEPT) 10 MG tablet TAKE 1 TABLET AT BEDTIME  . finasteride (PROSCAR) 5 MG tablet Take 1 tablet (5 mg total) by mouth daily.  . furosemide (LASIX) 20 MG tablet Take 1 tablet (20 mg total) by mouth daily as needed for fluid.  Marland Kitchen glyBURIDE (DIABETA) 2.5 MG tablet Take 2.5 mg by mouth 2 (two) times daily with a meal.  . ibuprofen (ADVIL,MOTRIN) 200 MG tablet Take 200 mg by mouth every 6 (six) hours as needed for mild pain.  Marland Kitchen LORazepam (ATIVAN) 0.5 MG tablet Take 1 tablet (0.5 mg total) by mouth 2 (two) times daily.  . memantine (NAMENDA) 10 MG tablet Take 10 mg by mouth 2 (two) times daily.  . metoprolol (LOPRESSOR) 50 MG tablet Take 0.5 tablets (25 mg total) by mouth 2 (two) times daily.  Marland Kitchen  QUEtiapine (SEROQUEL) 50 MG tablet TAKE 2 TABLETS IN THE MORNING AND 3 AT BEDTIME   No facility-administered encounter medications on file as of 10/10/2014.      Review of Systems  Constitutional: Negative.   Respiratory: Negative.   Cardiovascular: Negative.   Gastrointestinal: Negative.   Neurological: Positive for tremors.  Psychiatric/Behavioral: Positive for agitation.       Objective:   Physical Exam  Constitutional: He is oriented to person, place,  and time. He appears well-developed.  Eyes: Pupils are equal, round, and reactive to light.  Neck: Normal range of motion.  Cardiovascular: Normal rate and regular rhythm.   Pulmonary/Chest: Effort normal and breath sounds normal.  Neurological: He is alert and oriented to person, place, and time.  Psychiatric: He has a normal mood and affect. Thought content normal.    BP 122/52 mmHg  Pulse 41  Temp(Src) 97 F (36.1 C) (Oral)  Ht 5\' 11"  (1.803 m)  Wt 215 lb (97.523 kg)  BMI 30.00 kg/m2       Assessment & Plan:  1. Diabetes mellitus due to underlying condition without complications Patient eats pretty much what he wants to only medication is low dose glyburide - POCT glycosylated hemoglobin (Hb A1C)  2. Hyperlipidemia We had stopped statins due to dementia  3. Dementia with behavioral disturbance Tenuous Seroquel as before  4. Incontinence He has had hospitalizations for UTI and sepsis. Plan to monitor urine today - POCT UA - Microscopic Only - POCT urinalysis dipstick  Frederica Kuster MD

## 2014-10-11 ENCOUNTER — Telehealth: Payer: Self-pay | Admitting: *Deleted

## 2014-10-11 NOTE — Telephone Encounter (Signed)
-----   Message from Frederica Kuster, MD sent at 10/10/2014  5:30 PM EDT ----- Hemoglobin A1c is elevated but patient eats pre-much what he wants to including lots of doughnuts this is not surprising

## 2014-10-12 ENCOUNTER — Ambulatory Visit: Payer: Medicare Other | Admitting: Family Medicine

## 2014-11-08 ENCOUNTER — Other Ambulatory Visit: Payer: Self-pay | Admitting: Cardiology

## 2014-11-09 NOTE — Telephone Encounter (Signed)
Should the patient still be taking this? Please advise. Thanks, MI 

## 2014-11-09 NOTE — Telephone Encounter (Signed)
I don't see that he takes this medication.

## 2014-11-20 ENCOUNTER — Ambulatory Visit (INDEPENDENT_AMBULATORY_CARE_PROVIDER_SITE_OTHER): Payer: Medicare Other | Admitting: Family Medicine

## 2014-11-20 ENCOUNTER — Ambulatory Visit (INDEPENDENT_AMBULATORY_CARE_PROVIDER_SITE_OTHER): Payer: Medicare Other

## 2014-11-20 ENCOUNTER — Encounter: Payer: Self-pay | Admitting: Family Medicine

## 2014-11-20 VITALS — BP 133/74 | HR 92 | Temp 97.1°F | Ht 71.0 in | Wt 221.6 lb

## 2014-11-20 DIAGNOSIS — I6523 Occlusion and stenosis of bilateral carotid arteries: Secondary | ICD-10-CM

## 2014-11-20 DIAGNOSIS — J441 Chronic obstructive pulmonary disease with (acute) exacerbation: Secondary | ICD-10-CM | POA: Diagnosis not present

## 2014-11-20 MED ORDER — AZITHROMYCIN 250 MG PO TABS: ORAL_TABLET | ORAL | Status: DC

## 2014-11-20 MED ORDER — UMECLIDINIUM BROMIDE 62.5 MCG/INH IN AEPB: 1.0000 | INHALATION_SPRAY | Freq: Every day | RESPIRATORY_TRACT | Status: DC

## 2014-11-20 MED ORDER — PREDNISONE 20 MG PO TABS: ORAL_TABLET | ORAL | Status: DC

## 2014-11-20 NOTE — Progress Notes (Signed)
BP 133/74 mmHg  Pulse 92  Temp(Src) 97.1 F (36.2 C) (Oral)  Ht  (1.803 m)  Wt 221 lb 9.6 oz (100.517 kg)  BMI 30.92 kg/m2   Subjective:    Patient ID: [name], male    DOB: 17-Sep-1931, 81 y.o.   MRN: [id number]  HPI: [name] is a 81 y.o. male presenting on 11/20/2014 for Cough; Headache; and Sinusitis   HPI Cough and wheezing Patient is been having coughing and wheezing that of been getting worse. The cough and wheezing and sinus headache started a month to 2 months ago but of recently worsened over the past week. He has multiple nighttime episodes of coughing fits and also has daytime episodes of coughing fits daily. He has used his albuterol inhaler occasionally over the past month but not yet today. He denies any fevers or chills. Over the past year he has been hospitalized 2 times for upper respiratory/lower respiratory illnesses and has also had 4-5 times in the past year major episodes like he is currently having.  Relevant past medical, surgical, family and social history reviewed and updated as indicated. Interim medical history since our last visit reviewed. Allergies and medications reviewed and updated.  Review of Systems  Constitutional: Negative for fever and chills.  HENT: Positive for congestion, sinus pressure, sneezing and sore throat. Negative for ear discharge, ear pain, postnasal drip, rhinorrhea and voice change.   Eyes: Negative for pain, discharge, redness and visual disturbance.  Respiratory: Positive for cough, chest tightness, shortness of breath and wheezing.   Cardiovascular: Negative for chest pain and leg swelling.  Gastrointestinal: Negative for abdominal pain, diarrhea and constipation.  Genitourinary: Negative for difficulty urinating.  Musculoskeletal: Negative for back pain and gait problem.  Skin: Negative for rash.  Neurological: Negative for dizziness, syncope, light-headedness and headaches.  All other systems  reviewed and are negative.   Per HPI unless specifically indicated above     Medication List       This list is accurate as of: 11/20/14  1:48 PM.  Always use your most recent med list.               albuterol (2.5 MG/3ML) 0.083% nebulizer solution  Commonly known as:  PROVENTIL  Take 3 mLs (2.5 mg total) by nebulization every 6 (six) hours as needed for wheezing or shortness of breath.     allopurinol 300 MG tablet  Commonly known as:  ZYLOPRIM  Take 1 tablet (300 mg total) by mouth daily.     aspirin EC 81 MG tablet  Take 81 mg by mouth 2 (two) times daily.     azithromycin 250 MG tablet  Commonly known as:  ZITHROMAX  Take 2 the first day and then one each day after.     clopidogrel 75 MG tablet  Commonly known as:  PLAVIX  Take 75 mg by mouth daily.     donepezil 10 MG tablet  Commonly known as:  ARICEPT  TAKE 1 TABLET AT BEDTIME     finasteride 5 MG tablet  Commonly known as:  PROSCAR  Take 1 tablet (5 mg total) by mouth daily.     furosemide 20 MG tablet  Commonly known as:  LASIX  Take 1 tablet (20 mg total) by mouth daily as needed for fluid.     glyBURIDE 2.5 MG tablet  Commonly known as:  DIABETA  Take 2.5 mg by mouth 2 (two) times daily with a meal.  ibuprofen 200 MG tablet  Commonly known as:  ADVIL,MOTRIN  Take 200 mg by mouth every 6 (six) hours as needed for mild pain.     LORazepam 0.5 MG tablet  Commonly known as:  ATIVAN  Take 1 tablet (0.5 mg total) by mouth 2 (two) times daily.     memantine 10 MG tablet  Commonly known as:  NAMENDA  Take 10 mg by mouth 2 (two) times daily.     metoprolol 50 MG tablet  Commonly known as:  LOPRESSOR  Take 0.5 tablets (25 mg total) by mouth 2 (two) times daily.     predniSONE 20 MG tablet  Commonly known as:  DELTASONE  2 po at same time daily for 5 days     QUEtiapine 50 MG tablet  Commonly known as:  SEROQUEL  TAKE 2 TABLETS IN THE MORNING AND 3 AT BEDTIME     Umeclidinium Bromide 62.5  MCG/INH Aepb  Commonly known as:  INCRUSE ELLIPTA  Inhale 1 Inhaler into the lungs daily.           Objective:    BP 133/74 mmHg  Pulse 92  Temp(Src) 97.1 F (36.2 C) (Oral)  Ht  (1.803 m)  Wt 221 lb 9.6 oz (100.517 kg)  BMI 30.92 kg/m2  Wt Readings from Last 3 Encounters:  11/20/14 221 lb 9.6 oz (100.517 kg)  10/10/14 215 lb (97.523 kg)  08/08/14 216 lb (97.977 kg)    Physical Exam  Constitutional: He is oriented to person, place, and time. He appears well-developed and well-nourished. No distress.  HENT:  Right Ear: Tympanic membrane, external ear and ear canal normal.  Left Ear: Tympanic membrane, external ear and ear canal normal.  Nose: Mucosal edema and rhinorrhea present. No sinus tenderness. No epistaxis. Right sinus exhibits maxillary sinus tenderness. Right sinus exhibits no frontal sinus tenderness. Left sinus exhibits maxillary sinus tenderness. Left sinus exhibits no frontal sinus tenderness.  Mouth/Throat: Uvula is midline and mucous membranes are normal. Posterior oropharyngeal edema and posterior oropharyngeal erythema present. No oropharyngeal exudate or tonsillar abscesses.  Eyes: Conjunctivae and EOM are normal. Pupils are equal, round, and reactive to light. Right eye exhibits no discharge. No scleral icterus.  Cardiovascular: Normal rate, regular rhythm, normal heart sounds and intact distal pulses.   No murmur heard. Pulmonary/Chest: Effort normal. No respiratory distress. He has wheezes (end expiratory).  Musculoskeletal: Normal range of motion. He exhibits no edema.  Neurological: He is alert and oriented to person, place, and time. Coordination normal.  Skin: Skin is warm and dry. No rash noted. He is not diaphoretic.  Psychiatric: He has a normal mood and affect. His behavior is normal.  Vitals reviewed.   Results for orders placed or performed in visit on 10/10/14  POCT UA - Microscopic Only  Result Value Ref Range   WBC, Ur, HPF, POC rare      RBC, urine, microscopic neg    Bacteria, U Microscopic neg    Mucus, UA neg    Epithelial cells, urine per micros rare    Crystals, Ur, HPF, POC neg    Casts, Ur, LPF, POC neg    Yeast, UA neg   POCT urinalysis dipstick  Result Value Ref Range   Color, UA gold    Clarity, UA clear    Glucose, UA neg    Bilirubin, UA neg    Ketones, UA neg    Spec Grav, UA 1.015    Blood, UA neg  pH, UA 6.0    Protein, UA neg    Urobilinogen, UA negative    Nitrite, UA neg    Leukocytes, UA Negative Negative  POCT glycosylated hemoglobin (Hb A1C)  Result Value Ref Range   Hemoglobin A1C 8.2       Assessment & Plan:   Problem List Items Addressed This Visit      Respiratory   COPD exacerbation - Primary    Patient presents today with COPD exacerbation with known history of smoking in the distant past. Over the past year patient has been hospitalized twice for upper respiratory illnesses and he gets short of breath and coughing spells at least 4 or 5 times a year. He has an albuterol nebulizer but has not used it with this episode just yet we'll treat like COPD exacerbation with an antibiotic and will also start Spiriva as maintenance.      Relevant Medications   Umeclidinium Bromide (INCRUSE ELLIPTA) 62.5 MCG/INH AEPB   predniSONE (DELTASONE) 20 MG tablet   azithromycin (ZITHROMAX) 250 MG tablet   Other Relevant Orders   DG Chest 2 View (Completed)       Follow up plan: Return in about 4 weeks (around 12/18/2014), or if symptoms worsen or fail to improve.  Arville Care, MD [facility] 11/20/2014, 1:48 PM

## 2014-11-20 NOTE — Patient Instructions (Signed)

## 2014-11-21 NOTE — Assessment & Plan Note (Signed)
Patient presents today with COPD exacerbation with known history of smoking in the distant past. Over the past year patient has been hospitalized twice for upper respiratory illnesses and he gets short of breath and coughing spells at least 4 or 5 times a year. He has an albuterol nebulizer but has not used it with this episode just yet we'll treat like COPD exacerbation with an antibiotic and will also start Spiriva as maintenance.

## 2014-11-23 ENCOUNTER — Ambulatory Visit: Payer: Medicare Other | Admitting: Family Medicine

## 2014-11-26 ENCOUNTER — Other Ambulatory Visit: Payer: Self-pay | Admitting: Family Medicine

## 2014-11-30 ENCOUNTER — Encounter: Payer: Self-pay | Admitting: Family Medicine

## 2014-11-30 ENCOUNTER — Ambulatory Visit (INDEPENDENT_AMBULATORY_CARE_PROVIDER_SITE_OTHER): Payer: Medicare Other | Admitting: Family Medicine

## 2014-11-30 VITALS — BP 132/76 | HR 56 | Temp 98.1°F | Ht 71.0 in | Wt 217.2 lb

## 2014-11-30 DIAGNOSIS — Z0000 Encounter for general adult medical examination without abnormal findings: Secondary | ICD-10-CM | POA: Diagnosis not present

## 2014-11-30 DIAGNOSIS — Z23 Encounter for immunization: Secondary | ICD-10-CM | POA: Diagnosis not present

## 2014-11-30 NOTE — Progress Notes (Signed)
Subjective:    [name] is a 81 y.o. male who presents for Medicare Annual/Subsequent preventive examination.   Preventive Screening-Counseling & Management  Tobacco History  Smoking status  . Former Smoker -- 1.00 packs/day for 20 years  . Types: Cigarettes  . Quit date: 03/02/1974  Smokeless tobacco  . Never Used    Problems Prior to Visit 1. See Below  Current Problems (verified) Patient Active Problem List   Diagnosis Date Noted  . Acute urinary retention 05/30/2014  . Hematuria 05/29/2014  . Elevated lactic acid level 05/26/2014  . Hypokalemia 05/26/2014  . Acute gout   . Encephalopathy acute 05/06/2014  . UTI (lower urinary tract infection) 05/06/2014  . Acute renal insufficiency 05/06/2014  . Dehydration 05/06/2014  . FTT (failure to thrive) in adult 05/06/2014  . Renal insufficiency   . Diabetes mellitus due to underlying condition without complications   . COPD exacerbation 04/11/2014  . Chronic cough 04/10/2014  . Carotid artery occlusion 04/10/2014  . CVA (cerebral vascular accident) 04/10/2014  . Anemia 04/10/2014  . Dementia with behavioral disturbance 11/15/2013  . Benign paroxysmal positional vertigo 01/30/2013  . Adjustment reaction of adult life 01/30/2013  . Need for prophylactic vaccination and inoculation against influenza 12/09/2012  . Seizure 05/14/2011  . CAD (coronary artery disease) of artery bypass graft   . Hyperlipidemia   . Peripheral vascular disease   . Occlusion and stenosis of carotid artery without mention of cerebral infarction 02/11/2011    Medications Prior to Visit Current Outpatient Prescriptions on File Prior to Visit  Medication Sig Dispense Refill  . albuterol (PROVENTIL) (2.5 MG/3ML) 0.083% nebulizer solution Take 3 mLs (2.5 mg total) by nebulization every 6 (six) hours as needed for wheezing or shortness of breath. 75 mL 12  . allopurinol (ZYLOPRIM) 300 MG tablet TAKE 1 TABLET DAILY 90 tablet 0  . aspirin  EC 81 MG tablet Take 81 mg by mouth 2 (two) times daily.    Marland Kitchen azithromycin (ZITHROMAX) 250 MG tablet Take 2 the first day and then one each day after. 6 tablet 0  . clopidogrel (PLAVIX) 75 MG tablet Take 75 mg by mouth daily.    Marland Kitchen donepezil (ARICEPT) 10 MG tablet TAKE 1 TABLET AT BEDTIME 90 tablet 1  . finasteride (PROSCAR) 5 MG tablet Take 1 tablet (5 mg total) by mouth daily. 90 tablet 1  . furosemide (LASIX) 20 MG tablet Take 1 tablet (20 mg total) by mouth daily as needed for fluid. 90 tablet 1  . glyBURIDE (DIABETA) 2.5 MG tablet Take 2.5 mg by mouth 2 (two) times daily with a meal.    . ibuprofen (ADVIL,MOTRIN) 200 MG tablet Take 200 mg by mouth every 6 (six) hours as needed for mild pain.    Marland Kitchen LORazepam (ATIVAN) 0.5 MG tablet Take 1 tablet (0.5 mg total) by mouth 2 (two) times daily. 180 tablet 0  . memantine (NAMENDA) 10 MG tablet Take 10 mg by mouth 2 (two) times daily.    . metoprolol (LOPRESSOR) 50 MG tablet Take 0.5 tablets (25 mg total) by mouth 2 (two) times daily.    . predniSONE (DELTASONE) 20 MG tablet 2 po at same time daily for 5 days 10 tablet 0  . QUEtiapine (SEROQUEL) 50 MG tablet TAKE 2 TABLETS IN THE MORNING AND 3 AT BEDTIME 150 tablet 1  . Umeclidinium Bromide (INCRUSE ELLIPTA) 62.5 MCG/INH AEPB Inhale 1 Inhaler into the lungs daily. 1 each 2   No current facility-administered medications  on file prior to visit.    Current Medications (verified) Current Outpatient Prescriptions  Medication Sig Dispense Refill  . albuterol (PROVENTIL) (2.5 MG/3ML) 0.083% nebulizer solution Take 3 mLs (2.5 mg total) by nebulization every 6 (six) hours as needed for wheezing or shortness of breath. 75 mL 12  . allopurinol (ZYLOPRIM) 300 MG tablet TAKE 1 TABLET DAILY 90 tablet 0  . aspirin EC 81 MG tablet Take 81 mg by mouth 2 (two) times daily.    Marland Kitchen azithromycin (ZITHROMAX) 250 MG tablet Take 2 the first day and then one each day after. 6 tablet 0  . clopidogrel (PLAVIX) 75 MG tablet  Take 75 mg by mouth daily.    Marland Kitchen donepezil (ARICEPT) 10 MG tablet TAKE 1 TABLET AT BEDTIME 90 tablet 1  . finasteride (PROSCAR) 5 MG tablet Take 1 tablet (5 mg total) by mouth daily. 90 tablet 1  . furosemide (LASIX) 20 MG tablet Take 1 tablet (20 mg total) by mouth daily as needed for fluid. 90 tablet 1  . glyBURIDE (DIABETA) 2.5 MG tablet Take 2.5 mg by mouth 2 (two) times daily with a meal.    . ibuprofen (ADVIL,MOTRIN) 200 MG tablet Take 200 mg by mouth every 6 (six) hours as needed for mild pain.    Marland Kitchen LORazepam (ATIVAN) 0.5 MG tablet Take 1 tablet (0.5 mg total) by mouth 2 (two) times daily. 180 tablet 0  . memantine (NAMENDA) 10 MG tablet Take 10 mg by mouth 2 (two) times daily.    . metoprolol (LOPRESSOR) 50 MG tablet Take 0.5 tablets (25 mg total) by mouth 2 (two) times daily.    . predniSONE (DELTASONE) 20 MG tablet 2 po at same time daily for 5 days 10 tablet 0  . QUEtiapine (SEROQUEL) 50 MG tablet TAKE 2 TABLETS IN THE MORNING AND 3 AT BEDTIME 150 tablet 1  . Umeclidinium Bromide (INCRUSE ELLIPTA) 62.5 MCG/INH AEPB Inhale 1 Inhaler into the lungs daily. 1 each 2   No current facility-administered medications for this visit.     Allergies (verified) Fexofenadine hcl; Flonase; Nasal spray; and Nasal spray   PAST HISTORY  Family History Family History  Problem Relation Age of Onset  . Diabetes Sister   . Cancer Brother   . Diabetes Brother   . Anesthesia problems Neg Hx   . Hypotension Neg Hx   . Malignant hyperthermia Neg Hx   . Pseudochol deficiency Neg Hx   . Hearing loss Father     Social History Social History  Substance Use Topics  . Smoking status: Former Smoker -- 1.00 packs/day for 20 years    Types: Cigarettes    Quit date: 03/02/1974  . Smokeless tobacco: Never Used  . Alcohol Use: No     Comment: quit in 1981    Are there smokers in your home (other than you)?  No  Risk Factors Current exercise habits: Home exercise routine includes walking 0.5 hrs  per day.  Dietary issues discussed: None   Cardiac risk factors: advanced age (older than 41 for men, 64 for women), hypertension and male gender.  Depression Screen (Note: if answer to either of the following is "Yes", a more complete depression screening is indicated)   Q1: Over the past two weeks, have you felt down, depressed or hopeless? Yes  Q2: Over the past two weeks, have you felt little interest or pleasure in doing things? No  Have you lost interest or pleasure in daily life? No  Do  you often feel hopeless? No  Do you cry easily over simple problems? Yes  Activities of Daily Living In your present state of health, do you have any difficulty performing the following activities?:  Driving? Yes Managing money?  Yes Feeding yourself? No Getting from bed to chair? No Climbing a flight of stairs? Yes Preparing food and eating?: food prepared for him Bathing or showering? No Getting dressed: No Getting to the toilet? No Using the toilet:No Moving around from place to place: No In the past year have you fallen or had a near fall?:2 times, no recollection of why, describes tripping over his feet   Are you sexually active?  No  Do you have more than one partner?  No  Hearing Difficulties: Yes Do you often ask people to speak up or repeat themselves? Yes Do you experience ringing or noises in your ears? Yes Do you have difficulty understanding soft or whispered voices? Yes   Do you feel that you have a problem with memory? Yes  Do you often misplace items? Yes  Do you feel safe at home?  Yes  Cognitive Testing  Alert? Yes  Normal Appearance?Yes  Oriented to person? Yes  Place? No   Time? No  Recall of three objects?  No  Can perform simple calculations? Yes  Displays appropriate judgment?Yes  Can read the correct time from a watch face?Yes   Advanced Directives have been discussed with the patient? Yes   List the Names of Other Physician/Practitioners you currently  use: 1.  Dr. Louanne Skye, Dr. Hyacinth Meeker - PCP 2. Dr. Ulice Brilliant- Podiatry 3. Dr. Antoine Poche- Cardiology  Indicate any recent Medical Services you may have received from other than [facility] providers in the past year (date may be approximate).  Immunization History  Administered Date(s) Administered  . Influenza,inj,Quad PF,36+ Mos 12/09/2012, 12/12/2013    Screening Tests Health Maintenance  Topic Date Due  . FOOT EXAM  09/20/2014  . INFLUENZA VACCINE  12/20/2014 (Originally 10/01/2014)  . PNA vac Low Risk Adult (1 of 2 - PCV13) 11/20/2015 (Originally 12/18/1998)  . HEMOGLOBIN A1C  04/12/2015  . URINE MICROALBUMIN  07/11/2015  . OPHTHALMOLOGY EXAM  09/13/2015  . TETANUS/TDAP  10/31/2020  . ZOSTAVAX  Completed    All answers were reviewed with the patient and necessary referrals were made:  Kevin Fenton, MD   11/30/2014   History reviewed: allergies, current medications, past family history, past medical history, past social history, past surgical history and problem list  Review of Systems Pertinent items are noted in HPI.    Objective:      Blood pressure 132/76, pulse 56, temperature 98.1 F (36.7 C), temperature source Oral, height  (1.803 m), weight 217 lb 3.2 oz (98.521 kg). Body mass index is 30.31 kg/(m^2).  Gen: NAD, alert, cooperative with exam HEENT: NCAT, TMs normal bilaterally, nares clear, oropharynx clear CV: No murmur, slight bradycardia Resp: CTABL, no wheezes, non-labored Abd: SNTND, BS present, no guarding or organomegaly Ext: No edema, warm Neuro: Alert and conversational, MMSE score today is 14 Oriented to person, easily reaching watch and do simple calculations       Assessment:     [name] is a pleasant 81 year old male here for his Medicare annual wellness visit. His MMSE is 14 indicating severe cognitive impairment, likely moderate dementia. He is in good spirits and doing well. He has great family support. He is very involved with the  [employer].  He was given Prevnar and flu shot today.  Plan:     During the course of the visit the patient was educated and counseled about appropriate screening and preventive services including:    Pneumococcal vaccine   Influenza vaccine  Diet review for nutrition referral? No   Patient Instructions (the written plan) was given to the patient.  Medicare Attestation I have personally reviewed: The patient's medical and social history Their use of alcohol, tobacco or illicit drugs Their current medications and supplements The patient's functional ability including ADLs,fall risks, home safety risks, cognitive, and hearing and visual impairment Diet and physical activities Evidence for depression or mood disorders  The patient's weight, height, BMI, and visual acuity have been recorded in the chart.  I have made referrals, counseling, and provided education to the patient based on review of the above and I have provided the patient with a written personalized care plan for preventive services.     Kevin Fenton, MD   11/30/2014

## 2014-11-30 NOTE — Patient Instructions (Signed)
Great to meet you!  Keep your appointment with Dr, Hyacinth Meeker in November.

## 2014-12-04 ENCOUNTER — Telehealth: Payer: Self-pay | Admitting: Family Medicine

## 2014-12-04 MED ORDER — GLYBURIDE 2.5 MG PO TABS: 2.5000 mg | ORAL_TABLET | Freq: Two times a day (BID) | ORAL | Status: DC

## 2014-12-04 MED ORDER — GLYBURIDE 2.5 MG PO TABS: 2.5000 mg | ORAL_TABLET | Freq: Two times a day (BID) | ORAL | Status: AC

## 2014-12-04 NOTE — Telephone Encounter (Signed)
Both done 

## 2014-12-05 ENCOUNTER — Ambulatory Visit (INDEPENDENT_AMBULATORY_CARE_PROVIDER_SITE_OTHER): Payer: Medicare Other | Admitting: Family Medicine

## 2014-12-05 ENCOUNTER — Encounter: Payer: Self-pay | Admitting: Family Medicine

## 2014-12-05 ENCOUNTER — Ambulatory Visit (INDEPENDENT_AMBULATORY_CARE_PROVIDER_SITE_OTHER): Payer: Medicare Other

## 2014-12-05 VITALS — BP 179/57 | HR 49 | Temp 97.4°F | Wt 216.0 lb

## 2014-12-05 DIAGNOSIS — R0981 Nasal congestion: Secondary | ICD-10-CM

## 2014-12-05 DIAGNOSIS — F0391 Unspecified dementia with behavioral disturbance: Secondary | ICD-10-CM

## 2014-12-05 DIAGNOSIS — I6523 Occlusion and stenosis of bilateral carotid arteries: Secondary | ICD-10-CM | POA: Diagnosis not present

## 2014-12-05 DIAGNOSIS — F03918 Unspecified dementia, unspecified severity, with other behavioral disturbance: Secondary | ICD-10-CM

## 2014-12-05 MED ORDER — METOPROLOL TARTRATE 50 MG PO TABS: 50.0000 mg | ORAL_TABLET | Freq: Two times a day (BID) | ORAL | Status: DC

## 2014-12-05 NOTE — Patient Instructions (Signed)
Suggest Zyrtec over the counter for allergy symptoms.

## 2014-12-05 NOTE — Progress Notes (Signed)
Subjective:    Patient ID: [name], male    DOB: 05-14-1931, 81 y.o.   MRN: [id number]  HPI 81 year old gentleman with moderately severe dementia and COPD. He was treated on 924 and exacerbation of his COPD but symptoms of upper respiratory congestion persist. Those are sinus pain and pressure with rhinorrhea and general headache. I note that his blood pressure is much higher today systolically than over the last 3 months.    Review of Systems  Constitutional: Negative.   HENT: Positive for congestion, postnasal drip, rhinorrhea and sinus pressure.   Respiratory: Positive for cough.   Cardiovascular: Negative.   Genitourinary: Negative.   Neurological: Negative.   Psychiatric/Behavioral: Positive for decreased concentration and agitation.   Patient Active Problem List   Diagnosis Date Noted  . Acute urinary retention 05/30/2014  . Hematuria 05/29/2014  . Elevated lactic acid level 05/26/2014  . Hypokalemia 05/26/2014  . Acute gout   . Encephalopathy acute 05/06/2014  . UTI (lower urinary tract infection) 05/06/2014  . Acute renal insufficiency 05/06/2014  . Dehydration 05/06/2014  . FTT (failure to thrive) in adult 05/06/2014  . Renal insufficiency   . Diabetes mellitus due to underlying condition without complications (HCC)   . COPD exacerbation (HCC) 04/11/2014  . Chronic cough 04/10/2014  . Carotid artery occlusion 04/10/2014  . CVA (cerebral vascular accident) (HCC) 04/10/2014  . Anemia 04/10/2014  . Dementia with behavioral disturbance 11/15/2013  . Benign paroxysmal positional vertigo 01/30/2013  . Adjustment reaction of adult life 01/30/2013  . Need for prophylactic vaccination and inoculation against influenza 12/09/2012  . Seizure (HCC) 05/14/2011  . CAD (coronary artery disease) of artery bypass graft   . Hyperlipidemia   . Peripheral vascular disease (HCC)   . Occlusion and stenosis of carotid artery without mention of cerebral infarction  02/11/2011   Outpatient Encounter Prescriptions as of 12/05/2014  Medication Sig  . albuterol (PROVENTIL) (2.5 MG/3ML) 0.083% nebulizer solution Take 3 mLs (2.5 mg total) by nebulization every 6 (six) hours as needed for wheezing or shortness of breath.  . allopurinol (ZYLOPRIM) 300 MG tablet TAKE 1 TABLET DAILY  . aspirin EC 81 MG tablet Take 81 mg by mouth 2 (two) times daily.  . clopidogrel (PLAVIX) 75 MG tablet Take 75 mg by mouth daily.  Marland Kitchen donepezil (ARICEPT) 10 MG tablet TAKE 1 TABLET AT BEDTIME  . finasteride (PROSCAR) 5 MG tablet Take 1 tablet (5 mg total) by mouth daily.  . furosemide (LASIX) 20 MG tablet Take 1 tablet (20 mg total) by mouth daily as needed for fluid.  Marland Kitchen glyBURIDE (DIABETA) 2.5 MG tablet Take 1 tablet (2.5 mg total) by mouth 2 (two) times daily with a meal.  . ibuprofen (ADVIL,MOTRIN) 200 MG tablet Take 200 mg by mouth every 6 (six) hours as needed for mild pain.  Marland Kitchen LORazepam (ATIVAN) 0.5 MG tablet Take 1 tablet (0.5 mg total) by mouth 2 (two) times daily.  . memantine (NAMENDA) 10 MG tablet Take 10 mg by mouth 2 (two) times daily.  . metoprolol (LOPRESSOR) 50 MG tablet Take 0.5 tablets (25 mg total) by mouth 2 (two) times daily.  . QUEtiapine (SEROQUEL) 50 MG tablet TAKE 2 TABLETS IN THE MORNING AND 3 AT BEDTIME  . Umeclidinium Bromide (INCRUSE ELLIPTA) 62.5 MCG/INH AEPB Inhale 1 Inhaler into the lungs daily.  . [DISCONTINUED] azithromycin (ZITHROMAX) 250 MG tablet Take 2 the first day and then one each day after.  . [DISCONTINUED] predniSONE (DELTASONE) 20 MG tablet  2 po at same time daily for 5 days   No facility-administered encounter medications on file as of 12/05/2014.       Objective:   Physical Exam  Constitutional: He appears well-developed and well-nourished.  HENT:  Head: Normocephalic.  Sinuses are tender to percussion in the maxillary area  Cardiovascular: Normal rate and regular rhythm.   Pulmonary/Chest: Effort normal and breath sounds normal.     Waters view of sinuses shows no opacity or suggestion of infection       Assessment & Plan:  1. Sinus congestion Without infection on x-ray I think his symptoms are more related to allergic rhinitis. Will treat symptomatically with Zyrtec. - DG SinUS 1-2 Views; Future   2. Dementia with behavioral disturbance Thought processes are little worse today given fact that he is not at baseline in terms of his blood pressure and sinus symptoms. Regarding his blood pressure will increase metoprolol from 50 mg to 100 mg. This may be causing his systolic hypertension which he has not had before  Frederica Kuster MD

## 2014-12-06 ENCOUNTER — Telehealth: Payer: Self-pay | Admitting: Family Medicine

## 2014-12-06 NOTE — Telephone Encounter (Signed)
We could try Hycodan one or 2 teaspoons at bedtime or 4 times a day as needed cough 4 ounces

## 2014-12-06 NOTE — Telephone Encounter (Signed)
Pt is c/o headache and cough has gotten worse and he is using nebulizer and zyrtec but wants to know if he could have something sent to [facility], please advise.

## 2014-12-07 MED ORDER — HYDROCODONE-HOMATROPINE 5-1.5 MG/5ML PO SYRP: 5.0000 mL | ORAL_SOLUTION | Freq: Three times a day (TID) | ORAL | Status: DC | PRN

## 2014-12-07 NOTE — Telephone Encounter (Signed)
Pt aware rx for hycodan needs to be picked up at office. Since Dr.Miller is out today Dr.Moore is covering, so will have him sign rx and take up front.

## 2014-12-11 ENCOUNTER — Other Ambulatory Visit: Payer: Self-pay | Admitting: Family Medicine

## 2014-12-11 MED ORDER — METOPROLOL TARTRATE 50 MG PO TABS: 50.0000 mg | ORAL_TABLET | Freq: Two times a day (BID) | ORAL | Status: AC

## 2014-12-11 MED ORDER — METOPROLOL TARTRATE 50 MG PO TABS: 50.0000 mg | ORAL_TABLET | Freq: Two times a day (BID) | ORAL | Status: DC

## 2014-12-14 ENCOUNTER — Other Ambulatory Visit: Payer: Self-pay | Admitting: Cardiology

## 2014-12-14 ENCOUNTER — Other Ambulatory Visit: Payer: Self-pay | Admitting: Family Medicine

## 2014-12-17 NOTE — Telephone Encounter (Signed)
Last seen 12/05/14  Dr Miller 

## 2014-12-19 ENCOUNTER — Other Ambulatory Visit: Payer: Self-pay | Admitting: *Deleted

## 2014-12-19 ENCOUNTER — Telehealth: Payer: Self-pay | Admitting: Family Medicine

## 2014-12-19 MED ORDER — METOPROLOL TARTRATE 50 MG PO TABS: 50.0000 mg | ORAL_TABLET | Freq: Two times a day (BID) | ORAL | Status: DC

## 2014-12-19 NOTE — Telephone Encounter (Signed)
Left detailed message on patients voicemail that corrected rx sent to pharmacy

## 2015-01-04 ENCOUNTER — Other Ambulatory Visit: Payer: Self-pay | Admitting: Family Medicine

## 2015-01-04 MED ORDER — LORAZEPAM 0.5 MG PO TABS: 0.5000 mg | ORAL_TABLET | Freq: Two times a day (BID) | ORAL | Status: DC

## 2015-01-04 NOTE — Telephone Encounter (Signed)
Prescription placed up front, patient informed 

## 2015-01-04 NOTE — Telephone Encounter (Signed)
Okay to print

## 2015-01-04 NOTE — Telephone Encounter (Signed)
Please approve / or not -- if you do it will print and pt will have to mail this RX into the mail order pharm.

## 2015-01-16 ENCOUNTER — Ambulatory Visit (INDEPENDENT_AMBULATORY_CARE_PROVIDER_SITE_OTHER): Payer: Medicare Other | Admitting: Family Medicine

## 2015-01-16 ENCOUNTER — Encounter: Payer: Self-pay | Admitting: Family Medicine

## 2015-01-16 VITALS — BP 147/50 | HR 41 | Temp 96.9°F | Ht 71.0 in | Wt 219.0 lb

## 2015-01-16 DIAGNOSIS — I6523 Occlusion and stenosis of bilateral carotid arteries: Secondary | ICD-10-CM

## 2015-01-16 DIAGNOSIS — E785 Hyperlipidemia, unspecified: Secondary | ICD-10-CM | POA: Diagnosis not present

## 2015-01-16 DIAGNOSIS — E089 Diabetes mellitus due to underlying condition without complications: Secondary | ICD-10-CM

## 2015-01-16 LAB — POCT GLYCOSYLATED HEMOGLOBIN (HGB A1C): Hemoglobin A1C: 8

## 2015-01-16 MED ORDER — METFORMIN HCL 1000 MG PO TABS: 1000.0000 mg | ORAL_TABLET | Freq: Two times a day (BID) | ORAL | Status: AC

## 2015-01-16 MED ORDER — HYDROCODONE-HOMATROPINE 5-1.5 MG/5ML PO SYRP
5.0000 mL | ORAL_SOLUTION | Freq: Three times a day (TID) | ORAL | Status: DC | PRN
Start: 2015-01-16 — End: 2015-02-12

## 2015-01-16 MED ORDER — UMECLIDINIUM BROMIDE 62.5 MCG/INH IN AEPB: 1.0000 | INHALATION_SPRAY | Freq: Every day | RESPIRATORY_TRACT | Status: AC

## 2015-01-16 MED ORDER — HYDROCODONE-HOMATROPINE 5-1.5 MG/5ML PO SYRP: 5.0000 mL | ORAL_SOLUTION | Freq: Four times a day (QID) | ORAL | Status: DC | PRN

## 2015-01-16 NOTE — Progress Notes (Signed)
Subjective:    Patient ID: [name], male    DOB: 01-05-1932, 81 y.o.   MRN: [id number]  HPI 81 year old gentleman here to follow-up dementia, diabetes and hypertension and COPD. His dementia seems stable. There are behavioral disturbances mostly when he is around family members such as cursing and stubbornness not wanting to do what he is asked. There are problems with bathing. No recent history of wandering. He frequently does stay awake at night. He uses Seroquel in the a.m. and p.m. and also as needed Ativan. Line in his insurance had denied Spiriva and he feels like that helps his breathing. We will make an effort to resubmit request for that medication.  Patient Active Problem List   Diagnosis Date Noted  . Acute urinary retention 05/30/2014  . Hematuria 05/29/2014  . Elevated lactic acid level 05/26/2014  . Hypokalemia 05/26/2014  . Acute gout   . Encephalopathy acute 05/06/2014  . UTI (lower urinary tract infection) 05/06/2014  . Acute renal insufficiency 05/06/2014  . Dehydration 05/06/2014  . FTT (failure to thrive) in adult 05/06/2014  . Renal insufficiency   . Diabetes mellitus due to underlying condition without complications (HCC)   . COPD exacerbation (HCC) 04/11/2014  . Chronic cough 04/10/2014  . Carotid artery occlusion 04/10/2014  . CVA (cerebral vascular accident) (HCC) 04/10/2014  . Anemia 04/10/2014  . Dementia with behavioral disturbance 11/15/2013  . Benign paroxysmal positional vertigo 01/30/2013  . Adjustment reaction of adult life 01/30/2013  . Need for prophylactic vaccination and inoculation against influenza 12/09/2012  . Seizure (HCC) 05/14/2011  . CAD (coronary artery disease) of artery bypass graft   . Hyperlipidemia   . Peripheral vascular disease (HCC)   . Occlusion and stenosis of carotid artery without mention of cerebral infarction 02/11/2011   Outpatient Encounter Prescriptions as of 01/16/2015  Medication Sig  . albuterol  (PROVENTIL) (2.5 MG/3ML) 0.083% nebulizer solution Take 3 mLs (2.5 mg total) by nebulization every 6 (six) hours as needed for wheezing or shortness of breath.  . allopurinol (ZYLOPRIM) 300 MG tablet TAKE 1 TABLET DAILY  . aspirin EC 81 MG tablet Take 81 mg by mouth 2 (two) times daily.  . clopidogrel (PLAVIX) 75 MG tablet Take 75 mg by mouth daily.  Marland Kitchen. donepezil (ARICEPT) 10 MG tablet TAKE 1 TABLET AT BEDTIME  . finasteride (PROSCAR) 5 MG tablet Take 1 tablet (5 mg total) by mouth daily.  . furosemide (LASIX) 20 MG tablet Take 1 tablet (20 mg total) by mouth daily as needed for fluid.  Marland Kitchen. glyBURIDE (DIABETA) 2.5 MG tablet Take 1 tablet (2.5 mg total) by mouth 2 (two) times daily with a meal.  . HYDROcodone-homatropine (HYCODAN) 5-1.5 MG/5ML syrup Take 5 mLs by mouth every 8 (eight) hours as needed for cough.  Marland Kitchen. ibuprofen (ADVIL,MOTRIN) 200 MG tablet Take 200 mg by mouth every 6 (six) hours as needed for mild pain.  Marland Kitchen. LORazepam (ATIVAN) 0.5 MG tablet Take 1 tablet (0.5 mg total) by mouth 2 (two) times daily.  . memantine (NAMENDA) 10 MG tablet Take 10 mg by mouth 2 (two) times daily.  . metoprolol (LOPRESSOR) 50 MG tablet Take 1 tablet (50 mg total) by mouth 2 (two) times daily.  . metoprolol (LOPRESSOR) 50 MG tablet Take 1 tablet (50 mg total) by mouth 2 (two) times daily.  . QUEtiapine (SEROQUEL) 50 MG tablet TAKE 2 TABLETS IN THE MORNING AND 3 TABLETS AT BEDTIME  . Umeclidinium Bromide (INCRUSE ELLIPTA) 62.5 MCG/INH AEPB Inhale  1 Inhaler into the lungs daily.   No facility-administered encounter medications on file as of 01/16/2015.      Review of Systems  Constitutional: Negative.   HENT: Negative.   Respiratory: Positive for cough and shortness of breath.   Cardiovascular: Negative.   Genitourinary: Negative.   Neurological: Negative.   Psychiatric/Behavioral: Positive for behavioral problems, confusion and agitation.       Objective:   Physical Exam  Constitutional: He is  oriented to person, place, and time. He appears well-developed and well-nourished.  Cardiovascular: Normal rate and regular rhythm.   Pulmonary/Chest: Effort normal and breath sounds normal.  Neurological: He is alert and oriented to person, place, and time.  Psychiatric: He has a normal mood and affect. His behavior is normal. Thought content normal.          Assessment & Plan:  1. Hyperlipidemia He is not on statin at this time - Microalbumin, urine  2. Diabetes mellitus due to underlying condition without complication, without long-term current use of insulin [facility]) Patient eats pretty much what he wants and has gained weight. Would expect A1c to not be at goal. In reviewing his medicines he has been taking glyburide. I would like to substitute metformin for glyburide in hopes that we can get better control. The dosing as far as twice a day will be the same - Microalbumin, urine  Frederica Kuster MD - POCT glycosylated hemoglobin (Hb A1C)

## 2015-01-17 LAB — MICROALBUMIN, URINE: Microalbumin, Urine: 40.4 ug/mL

## 2015-01-18 NOTE — Progress Notes (Signed)
Lmtcb/ww 11/18

## 2015-01-25 ENCOUNTER — Telehealth: Payer: Self-pay | Admitting: Family Medicine

## 2015-01-25 ENCOUNTER — Other Ambulatory Visit: Payer: Self-pay | Admitting: Family Medicine

## 2015-01-25 NOTE — Telephone Encounter (Signed)
Patient advised that we would need to see him or they could try and call the urgent care and see if they would give him one.

## 2015-02-06 ENCOUNTER — Other Ambulatory Visit: Payer: Self-pay | Admitting: Cardiology

## 2015-02-06 NOTE — Telephone Encounter (Signed)
REFILL 

## 2015-02-08 ENCOUNTER — Other Ambulatory Visit: Payer: Self-pay | Admitting: Family Medicine

## 2015-02-08 MED ORDER — ALBUTEROL SULFATE (2.5 MG/3ML) 0.083% IN NEBU: 2.5000 mg | INHALATION_SOLUTION | Freq: Four times a day (QID) | RESPIRATORY_TRACT | Status: DC | PRN

## 2015-02-08 NOTE — Telephone Encounter (Signed)
done

## 2015-02-12 ENCOUNTER — Ambulatory Visit (INDEPENDENT_AMBULATORY_CARE_PROVIDER_SITE_OTHER): Payer: Medicare Other

## 2015-02-12 ENCOUNTER — Encounter: Payer: Self-pay | Admitting: Family Medicine

## 2015-02-12 ENCOUNTER — Ambulatory Visit (INDEPENDENT_AMBULATORY_CARE_PROVIDER_SITE_OTHER): Payer: Medicare Other | Admitting: Family Medicine

## 2015-02-12 VITALS — BP 149/72 | HR 59 | Temp 97.0°F | Ht 71.0 in | Wt 210.0 lb

## 2015-02-12 DIAGNOSIS — R059 Cough, unspecified: Secondary | ICD-10-CM

## 2015-02-12 DIAGNOSIS — R05 Cough: Secondary | ICD-10-CM

## 2015-02-12 DIAGNOSIS — I6523 Occlusion and stenosis of bilateral carotid arteries: Secondary | ICD-10-CM

## 2015-02-12 MED ORDER — HYDROCODONE-HOMATROPINE 5-1.5 MG/5ML PO SYRP: 5.0000 mL | ORAL_SOLUTION | Freq: Four times a day (QID) | ORAL | Status: DC | PRN

## 2015-02-12 MED ORDER — LORAZEPAM 0.5 MG PO TABS: 0.5000 mg | ORAL_TABLET | Freq: Two times a day (BID) | ORAL | Status: DC

## 2015-02-12 MED ORDER — ALBUTEROL SULFATE (2.5 MG/3ML) 0.083% IN NEBU: 2.5000 mg | INHALATION_SOLUTION | Freq: Four times a day (QID) | RESPIRATORY_TRACT | Status: AC | PRN

## 2015-02-12 MED ORDER — DOXYCYCLINE HYCLATE 100 MG PO TABS: 100.0000 mg | ORAL_TABLET | Freq: Two times a day (BID) | ORAL | Status: DC

## 2015-02-12 NOTE — Progress Notes (Signed)
   Subjective:    Patient ID: [name], male    DOB: [birth year], 81 y.o.   MRN: [id number]  HPI a 81-year-old male with a three-week history of cough and congestion. He was seen in urgent care about 2 weeks ago and given Z-Pak but symptoms have persisted. Cough is productive of yellow sputum he feels like there is congestion in both his head and chest. There've been no fever or chills.    Review of Systems  Constitutional: Negative.   HENT: Positive for congestion.   Respiratory: Positive for cough.   Cardiovascular: Negative.   Psychiatric/Behavioral: Negative.       BP 149/72 mmHg  Pulse 59  Temp(Src) 97 F (36.1 C) (Oral)  Ht 5\' 11"  (1.803 m)  Wt 210 lb (95.255 kg)  BMI 29.30 kg/m2  Objective:   Physical Exam  Constitutional: He is oriented to person, place, and time. He appears well-developed.  HENT:  Mouth/Throat: Oropharynx is clear and moist.  Cardiovascular: Normal rate and regular rhythm.   Pulmonary/Chest: Effort normal and breath sounds normal.  Neurological: He is alert and oriented to person, place, and time.          Assessment & Plan:  1. Cough Chest x-ray is clear with no evidence of pneumonia. Resume cough and congestion is related to sinus infection that was not responsive to Zithromax. Will try doxycycline along with Hycodan for cough. - DG Chest 2 View; Future   Frederica KusterStephen M Warda Mcqueary MD

## 2015-02-17 ENCOUNTER — Other Ambulatory Visit: Payer: Self-pay | Admitting: Family Medicine

## 2015-02-18 ENCOUNTER — Telehealth: Payer: Self-pay | Admitting: Family Medicine

## 2015-02-18 MED ORDER — AMOXICILLIN 500 MG PO CAPS: 500.0000 mg | ORAL_CAPSULE | Freq: Three times a day (TID) | ORAL | Status: DC

## 2015-02-18 NOTE — Telephone Encounter (Signed)
Pt was put on doxycycline last week and it is making him vomit a lot and he wants something else.

## 2015-02-18 NOTE — Telephone Encounter (Signed)
Discontinue doxycycline and go with amoxicillin 500 3 times daily for 10 days. Make sure that he was taking the doxycycline with food because if you take it on an empty stomach it can cause nausea. If he starts the amoxicillin he should definitely discontinue the doxycycline

## 2015-02-18 NOTE — Telephone Encounter (Signed)
Pt aware.

## 2015-02-19 ENCOUNTER — Other Ambulatory Visit: Payer: Self-pay | Admitting: *Deleted

## 2015-02-19 MED ORDER — AMOXICILLIN 500 MG PO CAPS: 500.0000 mg | ORAL_CAPSULE | Freq: Three times a day (TID) | ORAL | Status: DC

## 2015-02-19 NOTE — Progress Notes (Signed)
RX sent into [facility] in error Called [facility] to cancel RX sent into [facility] per pt request Okayed per Dr Christell ConstantMoore

## 2015-02-22 ENCOUNTER — Other Ambulatory Visit: Payer: Self-pay | Admitting: Family Medicine

## 2015-02-25 ENCOUNTER — Other Ambulatory Visit: Payer: Self-pay | Admitting: Family Medicine

## 2015-02-25 ENCOUNTER — Inpatient Hospital Stay (HOSPITAL_COMMUNITY)
Admission: EM | Admit: 2015-02-25 | Discharge: 2015-02-27 | DRG: 152 | Disposition: A | Discharge status: Home / Self Care | Payer: Medicare Other | Attending: Internal Medicine | Admitting: Internal Medicine

## 2015-02-25 ENCOUNTER — Emergency Department (HOSPITAL_COMMUNITY): Payer: Medicare Other

## 2015-02-25 ENCOUNTER — Inpatient Hospital Stay (HOSPITAL_COMMUNITY): Payer: Medicare Other

## 2015-02-25 ENCOUNTER — Encounter (HOSPITAL_COMMUNITY): Payer: Self-pay

## 2015-02-25 DIAGNOSIS — J0110 Acute frontal sinusitis, unspecified: Secondary | ICD-10-CM | POA: Diagnosis present

## 2015-02-25 DIAGNOSIS — R509 Fever, unspecified: Secondary | ICD-10-CM

## 2015-02-25 DIAGNOSIS — R531 Weakness: Secondary | ICD-10-CM | POA: Diagnosis present

## 2015-02-25 DIAGNOSIS — E785 Hyperlipidemia, unspecified: Secondary | ICD-10-CM | POA: Diagnosis present

## 2015-02-25 DIAGNOSIS — Z87891 Personal history of nicotine dependence: Secondary | ICD-10-CM

## 2015-02-25 DIAGNOSIS — N183 Chronic kidney disease, stage 3 (moderate): Secondary | ICD-10-CM | POA: Diagnosis present

## 2015-02-25 DIAGNOSIS — E11649 Type 2 diabetes mellitus with hypoglycemia without coma: Secondary | ICD-10-CM | POA: Diagnosis present

## 2015-02-25 DIAGNOSIS — M1990 Unspecified osteoarthritis, unspecified site: Secondary | ICD-10-CM | POA: Diagnosis present

## 2015-02-25 DIAGNOSIS — H9190 Unspecified hearing loss, unspecified ear: Secondary | ICD-10-CM | POA: Diagnosis present

## 2015-02-25 DIAGNOSIS — I10 Essential (primary) hypertension: Secondary | ICD-10-CM | POA: Diagnosis present

## 2015-02-25 DIAGNOSIS — R6510 Systemic inflammatory response syndrome (SIRS) of non-infectious origin without acute organ dysfunction: Secondary | ICD-10-CM | POA: Diagnosis not present

## 2015-02-25 DIAGNOSIS — K219 Gastro-esophageal reflux disease without esophagitis: Secondary | ICD-10-CM | POA: Diagnosis present

## 2015-02-25 DIAGNOSIS — N390 Urinary tract infection, site not specified: Secondary | ICD-10-CM | POA: Diagnosis present

## 2015-02-25 DIAGNOSIS — E089 Diabetes mellitus due to underlying condition without complications: Secondary | ICD-10-CM

## 2015-02-25 DIAGNOSIS — G9340 Encephalopathy, unspecified: Secondary | ICD-10-CM

## 2015-02-25 DIAGNOSIS — N179 Acute kidney failure, unspecified: Secondary | ICD-10-CM

## 2015-02-25 DIAGNOSIS — Z7902 Long term (current) use of antithrombotics/antiplatelets: Secondary | ICD-10-CM | POA: Diagnosis not present

## 2015-02-25 DIAGNOSIS — Z833 Family history of diabetes mellitus: Secondary | ICD-10-CM | POA: Diagnosis not present

## 2015-02-25 DIAGNOSIS — J0100 Acute maxillary sinusitis, unspecified: Principal | ICD-10-CM | POA: Diagnosis present

## 2015-02-25 DIAGNOSIS — Z7982 Long term (current) use of aspirin: Secondary | ICD-10-CM | POA: Diagnosis not present

## 2015-02-25 DIAGNOSIS — J40 Bronchitis, not specified as acute or chronic: Secondary | ICD-10-CM

## 2015-02-25 DIAGNOSIS — W19XXXA Unspecified fall, initial encounter: Secondary | ICD-10-CM

## 2015-02-25 DIAGNOSIS — Z79899 Other long term (current) drug therapy: Secondary | ICD-10-CM

## 2015-02-25 DIAGNOSIS — Z7984 Long term (current) use of oral hypoglycemic drugs: Secondary | ICD-10-CM | POA: Diagnosis not present

## 2015-02-25 DIAGNOSIS — Z791 Long term (current) use of non-steroidal anti-inflammatories (NSAID): Secondary | ICD-10-CM

## 2015-02-25 DIAGNOSIS — N289 Disorder of kidney and ureter, unspecified: Secondary | ICD-10-CM

## 2015-02-25 DIAGNOSIS — R51 Headache: Secondary | ICD-10-CM

## 2015-02-25 DIAGNOSIS — E1122 Type 2 diabetes mellitus with diabetic chronic kidney disease: Secondary | ICD-10-CM | POA: Diagnosis present

## 2015-02-25 DIAGNOSIS — Z8673 Personal history of transient ischemic attack (TIA), and cerebral infarction without residual deficits: Secondary | ICD-10-CM

## 2015-02-25 DIAGNOSIS — R519 Headache, unspecified: Secondary | ICD-10-CM

## 2015-02-25 DIAGNOSIS — I129 Hypertensive chronic kidney disease with stage 1 through stage 4 chronic kidney disease, or unspecified chronic kidney disease: Secondary | ICD-10-CM | POA: Diagnosis present

## 2015-02-25 LAB — CBC WITH DIFFERENTIAL/PLATELET
BASOS ABS: 0 10*3/uL (ref 0.0–0.1)
BASOS PCT: 1 %
EOS ABS: 0.1 10*3/uL (ref 0.0–0.7)
Eosinophils Relative: 1 %
HCT: 36.7 % — ABNORMAL LOW (ref 39.0–52.0)
HEMOGLOBIN: 12.1 g/dL — AB (ref 13.0–17.0)
Lymphocytes Relative: 20 %
Lymphs Abs: 1.6 10*3/uL (ref 0.7–4.0)
MCH: 31.1 pg (ref 26.0–34.0)
MCHC: 33 g/dL (ref 30.0–36.0)
MCV: 94.3 fL (ref 78.0–100.0)
MONOS PCT: 7 %
Monocytes Absolute: 0.5 10*3/uL (ref 0.1–1.0)
NEUTROS PCT: 71 %
Neutro Abs: 5.5 10*3/uL (ref 1.7–7.7)
Platelets: 191 10*3/uL (ref 150–400)
RBC: 3.89 MIL/uL — ABNORMAL LOW (ref 4.22–5.81)
RDW: 15.1 % (ref 11.5–15.5)
WBC: 7.7 10*3/uL (ref 4.0–10.5)

## 2015-02-25 LAB — COMPREHENSIVE METABOLIC PANEL
ALBUMIN: 3.5 g/dL (ref 3.5–5.0)
ALK PHOS: 44 U/L (ref 38–126)
ALT: 19 U/L (ref 17–63)
ANION GAP: 10 (ref 5–15)
AST: 18 U/L (ref 15–41)
BUN: 29 mg/dL — ABNORMAL HIGH (ref 6–20)
CALCIUM: 8.4 mg/dL — AB (ref 8.9–10.3)
CO2: 26 mmol/L (ref 22–32)
Chloride: 100 mmol/L — ABNORMAL LOW (ref 101–111)
Creatinine, Ser: 1.93 mg/dL — ABNORMAL HIGH (ref 0.61–1.24)
GFR calc Af Amer: 36 mL/min — ABNORMAL LOW (ref >60)
GFR calc non Af Amer: 31 mL/min — ABNORMAL LOW (ref >60)
GLUCOSE: 99 mg/dL (ref 65–99)
POTASSIUM: 4.2 mmol/L (ref 3.5–5.1)
SODIUM: 136 mmol/L (ref 135–145)
Total Bilirubin: 0.3 mg/dL (ref 0.3–1.2)
Total Protein: 6.7 g/dL (ref 6.5–8.1)

## 2015-02-25 LAB — PROCALCITONIN

## 2015-02-25 LAB — URINALYSIS, ROUTINE W REFLEX MICROSCOPIC
BILIRUBIN URINE: NEGATIVE
Glucose, UA: NEGATIVE mg/dL
HGB URINE DIPSTICK: NEGATIVE
Leukocytes, UA: NEGATIVE
Nitrite: NEGATIVE
PROTEIN: NEGATIVE mg/dL
Specific Gravity, Urine: 1.02 (ref 1.005–1.030)
pH: 5.5 (ref 5.0–8.0)

## 2015-02-25 LAB — TROPONIN I: Troponin I: <0.03 ng/mL (ref <0.031)

## 2015-02-25 LAB — PROTIME-INR
INR: 1.11 (ref 0.00–1.49)
PROTHROMBIN TIME: 14.4 s (ref 11.6–15.2)

## 2015-02-25 LAB — LACTIC ACID, PLASMA
LACTIC ACID, VENOUS: 2 mmol/L (ref 0.5–2.0)
Lactic Acid, Venous: 1.7 mmol/L (ref 0.5–2.0)
Lactic Acid, Venous: 0.9 mmol/L (ref 0.5–2.0)

## 2015-02-25 LAB — APTT: aPTT: 30 seconds (ref 24–37)

## 2015-02-25 MED ORDER — QUETIAPINE FUMARATE 100 MG PO TABS
100.0000 mg | ORAL_TABLET | Freq: Two times a day (BID) | ORAL | Status: DC
  Administered 2015-02-25 – 2015-02-27 (×5): 100 mg via ORAL
  Filled 2015-02-25 (×5): qty 1

## 2015-02-25 MED ORDER — VANCOMYCIN HCL 10 G IV SOLR
1250.0000 mg | INTRAVENOUS | Status: DC
  Filled 2015-02-25: qty 1250

## 2015-02-25 MED ORDER — HYDROMORPHONE HCL 1 MG/ML IJ SOLN: 0.5000 mg | INTRAMUSCULAR | Status: DC | PRN

## 2015-02-25 MED ORDER — ACETAMINOPHEN 325 MG PO TABS
650.0000 mg | ORAL_TABLET | Freq: Once | ORAL | Status: AC
  Administered 2015-02-25: 650 mg via ORAL
  Filled 2015-02-25: qty 2

## 2015-02-25 MED ORDER — ACETAMINOPHEN 325 MG PO TABS
325.0000 mg | ORAL_TABLET | Freq: Once | ORAL | Status: AC
  Administered 2015-02-25: 325 mg via ORAL
  Filled 2015-02-25: qty 1

## 2015-02-25 MED ORDER — MEMANTINE HCL 10 MG PO TABS
10.0000 mg | ORAL_TABLET | Freq: Two times a day (BID) | ORAL | Status: DC
  Administered 2015-02-25 – 2015-02-27 (×5): 10 mg via ORAL
  Filled 2015-02-25 (×5): qty 1

## 2015-02-25 MED ORDER — ALUM & MAG HYDROXIDE-SIMETH 200-200-20 MG/5ML PO SUSP: 30.0000 mL | Freq: Four times a day (QID) | ORAL | Status: DC | PRN

## 2015-02-25 MED ORDER — FENTANYL CITRATE (PF) 100 MCG/2ML IJ SOLN
25.0000 ug | Freq: Once | INTRAMUSCULAR | Status: AC
  Administered 2015-02-25: 25 ug via INTRAVENOUS
  Filled 2015-02-25: qty 2

## 2015-02-25 MED ORDER — ALLOPURINOL 300 MG PO TABS
300.0000 mg | ORAL_TABLET | Freq: Every day | ORAL | Status: DC
  Administered 2015-02-25 – 2015-02-27 (×3): 300 mg via ORAL
  Filled 2015-02-25 (×3): qty 1

## 2015-02-25 MED ORDER — DEXTROSE 5 % IV SOLN
2.0000 g | Freq: Once | INTRAVENOUS | Status: AC
  Administered 2015-02-25: 2 g via INTRAVENOUS
  Filled 2015-02-25: qty 2

## 2015-02-25 MED ORDER — METOPROLOL TARTRATE 50 MG PO TABS
50.0000 mg | ORAL_TABLET | Freq: Two times a day (BID) | ORAL | Status: DC
  Administered 2015-02-25 – 2015-02-27 (×5): 50 mg via ORAL
  Filled 2015-02-25 (×5): qty 1

## 2015-02-25 MED ORDER — DONEPEZIL HCL 5 MG PO TABS
10.0000 mg | ORAL_TABLET | Freq: Every day | ORAL | Status: DC
  Administered 2015-02-25 – 2015-02-26 (×2): 10 mg via ORAL
  Filled 2015-02-25 (×2): qty 2

## 2015-02-25 MED ORDER — FINASTERIDE 5 MG PO TABS
5.0000 mg | ORAL_TABLET | Freq: Every day | ORAL | Status: DC
  Administered 2015-02-25 – 2015-02-27 (×3): 5 mg via ORAL
  Filled 2015-02-25 (×5): qty 1

## 2015-02-25 MED ORDER — OXYCODONE HCL 5 MG PO TABS
5.0000 mg | ORAL_TABLET | ORAL | Status: DC | PRN
  Administered 2015-02-26 (×3): 5 mg via ORAL
  Filled 2015-02-25 (×3): qty 1

## 2015-02-25 MED ORDER — SODIUM CHLORIDE 0.9 % IV SOLN
INTRAVENOUS | Status: AC
  Administered 2015-02-25: 17:00:00 via INTRAVENOUS

## 2015-02-25 MED ORDER — ASPIRIN EC 81 MG PO TBEC
81.0000 mg | DELAYED_RELEASE_TABLET | Freq: Two times a day (BID) | ORAL | Status: DC
  Administered 2015-02-25 – 2015-02-27 (×5): 81 mg via ORAL
  Filled 2015-02-25 (×5): qty 1

## 2015-02-25 MED ORDER — CLOPIDOGREL BISULFATE 75 MG PO TABS
75.0000 mg | ORAL_TABLET | Freq: Every day | ORAL | Status: DC
  Administered 2015-02-25 – 2015-02-27 (×3): 75 mg via ORAL
  Filled 2015-02-25 (×3): qty 1

## 2015-02-25 MED ORDER — ONDANSETRON HCL 4 MG PO TABS: 4.0000 mg | ORAL_TABLET | Freq: Four times a day (QID) | ORAL | Status: DC | PRN

## 2015-02-25 MED ORDER — SODIUM CHLORIDE 0.9 % IV BOLUS (SEPSIS)
1000.0000 mL | INTRAVENOUS | Status: AC
  Administered 2015-02-25 (×2): 1000 mL via INTRAVENOUS

## 2015-02-25 MED ORDER — ACETAMINOPHEN 650 MG RE SUPP: 650.0000 mg | Freq: Four times a day (QID) | RECTAL | Status: DC | PRN

## 2015-02-25 MED ORDER — IBUPROFEN 400 MG PO TABS
400.0000 mg | ORAL_TABLET | Freq: Once | ORAL | Status: AC
  Administered 2015-02-25: 400 mg via ORAL
  Filled 2015-02-25: qty 1

## 2015-02-25 MED ORDER — ACETAMINOPHEN 325 MG PO TABS
650.0000 mg | ORAL_TABLET | Freq: Four times a day (QID) | ORAL | Status: DC | PRN
  Administered 2015-02-26: 650 mg via ORAL
  Filled 2015-02-25: qty 2

## 2015-02-25 MED ORDER — ONDANSETRON HCL 4 MG/2ML IJ SOLN: 4.0000 mg | Freq: Four times a day (QID) | INTRAMUSCULAR | Status: DC | PRN

## 2015-02-25 MED ORDER — VANCOMYCIN HCL 10 G IV SOLR
1500.0000 mg | Freq: Once | INTRAVENOUS | Status: AC
  Administered 2015-02-25: 1500 mg via INTRAVENOUS
  Filled 2015-02-25: qty 1500

## 2015-02-25 MED ORDER — ENOXAPARIN SODIUM 40 MG/0.4ML ~~LOC~~ SOLN
40.0000 mg | SUBCUTANEOUS | Status: DC
  Administered 2015-02-25 – 2015-02-27 (×3): 40 mg via SUBCUTANEOUS
  Filled 2015-02-25 (×2): qty 0.4

## 2015-02-25 MED ORDER — SODIUM CHLORIDE 0.9 % IV BOLUS (SEPSIS)
500.0000 mL | INTRAVENOUS | Status: AC
  Administered 2015-02-25: 500 mL via INTRAVENOUS

## 2015-02-25 MED ORDER — DEXTROSE 5 % IV SOLN
2.0000 g | INTRAVENOUS | Status: DC
  Administered 2015-02-25 – 2015-02-26 (×2): 2 g via INTRAVENOUS
  Filled 2015-02-25 (×3): qty 2

## 2015-02-25 NOTE — ED Provider Notes (Signed)
CSN: [id number]     Arrival date & time 02/25/15  0308 History   First MD Initiated Contact with Patient 02/25/15 0327     Chief Complaint  Patient presents with  . Fever     (Consider location/radiation/quality/duration/timing/severity/associated sxs/prior Treatment) HPI patient presents to the emergency department via EMS of his son stating he started feeling ill 3 months ago. He states he's sleeping all the time. He's had a cough off and on and has yellow sometimes green mucus production. He denies sore throat but states he has clear rhinorrhea and sneezing. He denies chest pain and states sometimes he has shortness of breath and wheezing. He does use a nebulizer at home for COPD. He denies abdominal pain, nausea, vomiting, diarrhea, or dysuria. He does report increasing urinary frequency. His son states he has episodes where he gets sweaty and shaky however they do not check his temperature. His son states last time he had shaking episodes he had a urinary tract infection. He has been seen by his PCP they state 3-4 times has been on several rounds of antibiotics that they cannot name. He has had a normal appetite and he has his baseline confusion from his dementia that is not worse. Tonight however patient was having difficulty walking because he was so weak and that prompted them to call EMS.  [facility] in [place]   Past Medical History  Diagnosis Date  . Diabetes mellitus type 2   . GERD (gastroesophageal reflux disease)   . Hyperlipidemia     takes Niacin nightly  . DJD (degenerative joint disease)   . Hypertension     takes Metoprolol and Ramipril daily  . Dizziness   . Gout     hx of--49yrs ago  . Bruises easily     pt is on Plavix and ASA  . Shortness of breath     wakes pt up during night with occ shortness of breathe;states it happens about once a month  . Impaired hearing   . Urinary frequency   . UTI (lower urinary tract infection)     hx of--03/2010  .  Anemia     takes Glyburide bid  . CAD (coronary artery disease) of artery bypass graft   . Pneumonia     "at least twice that I know of"  . Seizures (HCC) 05/14/11    "first time was today"  . CVA (cerebral vascular accident) [facility]) ~ 2009    right arm/leg occ hurts  . Carotid artery occlusion 2013    s/p bilateral endarterectomy  . Chronic headache   . Chronic neck pain   . Dementia with behavioral disturbance   . Dyspnea     chronic, daily   Past Surgical History  Procedure Laterality Date  . Appendectomy  1968  . Colonoscopy    . Endarterectomy  04/09/2011    Procedure: ENDARTERECTOMY CAROTID;  Surgeon: Chuck Hint, MD;  Location: [facility] OR;  Service: Vascular;  Laterality: Left;  Left Carotid endarterectomy With Dacron Patch Angioplasty  . Cardiac catheterization  1998    Dr. Antoine Poche  . Endarterectomy  05/08/2011    Procedure: ENDARTERECTOMY CAROTID;  Surgeon: Chuck Hint, MD;  Location: [facility] OR;  Service: Vascular;  Laterality: Right;  with [place] Patch Angioplasty  . Cataract extraction w/ intraocular lens  implant, bilateral  2012  . Coronary artery bypass graft  1998    LIMA to LAD, SVG to diagonal, SVG to PDA, SVG to posterior lateral.   .  Eye surgery  1953    right ; "piece of metal removed"  . Eye surgery  2012    Cataract- Bilateral   Family History  Problem Relation Age of Onset  . Diabetes Sister   . Cancer Brother   . Diabetes Brother   . Anesthesia problems Neg Hx   . Hypotension Neg Hx   . Malignant hyperthermia Neg Hx   . Pseudochol deficiency Neg Hx   . Hearing loss Father    Social History  Substance Use Topics  . Smoking status: Former Smoker -- 1.00 packs/day for 20 years    Types: Cigarettes    Quit date: 03/02/1974  . Smokeless tobacco: Never Used  . Alcohol Use: No     Comment: quit in 49  son lives with patient  Review of Systems  All other systems reviewed and are negative.     Allergies  Fexofenadine hcl;  Flonase; Nasal spray; and Nasal spray  Home Medications   Prior to Admission medications   Medication Sig Start Date End Date Taking? Authorizing Provider  albuterol (PROVENTIL) (2.5 MG/3ML) 0.083% nebulizer solution Take 3 mLs (2.5 mg total) by nebulization every 6 (six) hours as needed for wheezing or shortness of breath. 02/12/15   Frederica Kuster, MD  allopurinol (ZYLOPRIM) 300 MG tablet TAKE 1 TABLET DAILY 02/22/15   Frederica Kuster, MD  amoxicillin (AMOXIL) 500 MG capsule Take 1 capsule (500 mg total) by mouth 3 (three) times daily. 02/19/15   Ernestina Penna, MD  aspirin EC 81 MG tablet Take 81 mg by mouth 2 (two) times daily.    Historical Provider, MD  clopidogrel (PLAVIX) 75 MG tablet TAKE 1 TABLET DAILY 02/06/15   Rollene Rotunda, MD  donepezil (ARICEPT) 10 MG tablet TAKE 1 TABLET AT BEDTIME 01/25/15   Frederica Kuster, MD  doxycycline (VIBRA-TABS) 100 MG tablet Take 1 tablet (100 mg total) by mouth 2 (two) times daily. 02/12/15   Frederica Kuster, MD  finasteride (PROSCAR) 5 MG tablet TAKE 1 TABLET DAILY 02/18/15   Frederica Kuster, MD  furosemide (LASIX) 20 MG tablet Take 1 tablet (20 mg total) by mouth daily as needed for fluid. 10/10/14   Frederica Kuster, MD  glyBURIDE (DIABETA) 2.5 MG tablet Take 1 tablet (2.5 mg total) by mouth 2 (two) times daily with a meal. 12/04/14   Frederica Kuster, MD  HYDROcodone-homatropine [facility]) 5-1.5 MG/5ML syrup Take 5 mLs by mouth every 6 (six) hours as needed for cough. 02/12/15   Frederica Kuster, MD  ibuprofen (ADVIL,MOTRIN) 200 MG tablet Take 200 mg by mouth every 6 (six) hours as needed for mild pain.    Historical Provider, MD  LORazepam (ATIVAN) 0.5 MG tablet Take 1 tablet (0.5 mg total) by mouth 2 (two) times daily. 02/12/15   Frederica Kuster, MD  memantine (NAMENDA) 10 MG tablet Take 10 mg by mouth 2 (two) times daily.    Historical Provider, MD  metFORMIN (GLUCOPHAGE) 1000 MG tablet Take 1 tablet (1,000 mg total) by mouth 2 (two) times  daily with a meal. 01/16/15   Frederica Kuster, MD  metoprolol (LOPRESSOR) 50 MG tablet Take 1 tablet (50 mg total) by mouth 2 (two) times daily. 12/11/14   Frederica Kuster, MD  QUEtiapine (SEROQUEL) 50 MG tablet TAKE 2 TABLETS IN THE MORNING AND 3 TABLETS AT BEDTIME 12/18/14   Frederica Kuster, MD  Umeclidinium Bromide (INCRUSE ELLIPTA) 62.5 MCG/INH AEPB Inhale 1 Inhaler into  the lungs daily. 01/16/15   Frederica Kuster, MD   BP 136/78 mmHg  Pulse 106  Temp(Src) 101.7 F (38.7 C) (Oral)  Resp 28  SpO2 95%  Vital signs normal suffered tachycardia and fever  Physical Exam  Constitutional: He is oriented to person, place, and time. He appears well-developed and well-nourished.  Non-toxic appearance. He does not appear ill. No distress.  HENT:  Head: Normocephalic and atraumatic.  Right Ear: External ear normal.  Left Ear: External ear normal.  Nose: Nose normal. No mucosal edema or rhinorrhea.  Mouth/Throat: Oropharynx is clear and moist and mucous membranes are normal. No dental abscesses or uvula swelling.  Eyes: Conjunctivae and EOM are normal. Pupils are equal, round, and reactive to light.  Neck: Normal range of motion and full passive range of motion without pain. Neck supple.  Cardiovascular: Normal rate, regular rhythm and normal heart sounds.  Exam reveals no gallop and no friction rub.   No murmur heard. Pulmonary/Chest: Effort normal. No respiratory distress. He has wheezes. He has no rhonchi. He has no rales. He exhibits no tenderness and no crepitus.  Few scattered wheezes, however his son states "he always has wheezing". They state they did a nebulizer treatment prior to calling EMS.  Abdominal: Soft. Normal appearance and bowel sounds are normal. He exhibits no distension. There is no tenderness. There is no rebound and no guarding.  Musculoskeletal: Normal range of motion. He exhibits no edema or tenderness.  Moves all extremities well.   Neurological: He is alert and  oriented to person, place, and time. He has normal strength. No cranial nerve deficit.  Skin: Skin is warm, dry and intact. No rash noted. No erythema. No pallor.  Psychiatric: He has a normal mood and affect. His speech is normal and behavior is normal. His mood appears not anxious.  Nursing note and vitals reviewed.   ED Course  Procedures (including critical care time)  Medications  sodium chloride 0.9 % bolus 1,000 mL (1,000 mLs Intravenous New Bag/Given 02/25/15 0511)    Followed by  sodium chloride 0.9 % bolus 500 mL (not administered)  ibuprofen (ADVIL,MOTRIN) tablet 400 mg (not administered)  acetaminophen (TYLENOL) tablet 650 mg (650 mg Oral Given 02/25/15 0326)  cefTRIAXone (ROCEPHIN) 2 g in dextrose 5 % 50 mL IVPB (0 g Intravenous Stopped 02/25/15 0444)  acetaminophen (TYLENOL) tablet 325 mg (325 mg Oral Given 02/25/15 0523)   Sepsis protocol started except for calling code sepsis. Patient was given a 2500 mL bolus of IV fluids. He was started on Rocephin for presumed urinary tract infection.  View of patient's labs are unrevealing as to the source of his weakness and fever. Reviewing his chart he was put on doxycycline earlier in the month, most recently this past week he was placed on amoxicillin by his PCP. However family states tonight patient was too weak to walk. I will talk to the hospitalist about admission.  5:40 AM Dr. Maryella Shivers, hospitalist, admit to med-surgery.  Labs Review Results for orders placed or performed during the hospital encounter of 02/25/15  Blood Culture (routine x 2)  Result Value Ref Range   Specimen Description RIGHT ANTECUBITAL    Special Requests BOTTLES DRAWN AEROBIC AND ANAEROBIC 6CC    Culture PENDING    Report Status PENDING   Blood Culture (routine x 2)  Result Value Ref Range   Specimen Description RIGHT ANTECUBITAL    Special Requests BOTTLES DRAWN AEROBIC AND ANAEROBIC 6CC    Culture  PENDING    Report Status PENDING   CBC  with Differential  Result Value Ref Range   WBC 7.7 4.0 - 10.5 K/uL   RBC 3.89 (L) 4.22 - 5.81 MIL/uL   Hemoglobin 12.1 (L) 13.0 - 17.0 g/dL   HCT 57.836.7 (L) 46.939.0 - 62.952.0 %   MCV 94.3 78.0 - 100.0 fL   MCH 31.1 26.0 - 34.0 pg   MCHC 33.0 30.0 - 36.0 g/dL   RDW 52.815.1 41.311.5 - 24.415.5 %   Platelets 191 150 - 400 K/uL   Neutrophils Relative % 71 %   Neutro Abs 5.5 1.7 - 7.7 K/uL   Lymphocytes Relative 20 %   Lymphs Abs 1.6 0.7 - 4.0 K/uL   Monocytes Relative 7 %   Monocytes Absolute 0.5 0.1 - 1.0 K/uL   Eosinophils Relative 1 %   Eosinophils Absolute 0.1 0.0 - 0.7 K/uL   Basophils Relative 1 %   Basophils Absolute 0.0 0.0 - 0.1 K/uL  Comprehensive metabolic panel  Result Value Ref Range   Sodium 136 135 - 145 mmol/L   Potassium 4.2 3.5 - 5.1 mmol/L   Chloride 100 (L) 101 - 111 mmol/L   CO2 26 22 - 32 mmol/L   Glucose, Bld 99 65 - 99 mg/dL   BUN 29 (H) 6 - 20 mg/dL   Creatinine, Ser 0.101.93 (H) 0.61 - 1.24 mg/dL   Calcium 8.4 (L) 8.9 - 10.3 mg/dL   Total Protein 6.7 6.5 - 8.1 g/dL   Albumin 3.5 3.5 - 5.0 g/dL   AST 18 15 - 41 U/L   ALT 19 17 - 63 U/L   Alkaline Phosphatase 44 38 - 126 U/L   Total Bilirubin 0.3 0.3 - 1.2 mg/dL   GFR calc non Af Amer 31 (L) >60 mL/min   GFR calc Af Amer 36 (L) >60 mL/min   Anion gap 10 5 - 15  Urinalysis, Routine w reflex microscopic (not at [facility])  Result Value Ref Range   Color, Urine YELLOW YELLOW   APPearance CLEAR CLEAR   Specific Gravity, Urine 1.020 1.005 - 1.030   pH 5.5 5.0 - 8.0   Glucose, UA NEGATIVE NEGATIVE mg/dL   Hgb urine dipstick NEGATIVE NEGATIVE   Bilirubin Urine NEGATIVE NEGATIVE   Ketones, ur TRACE (A) NEGATIVE mg/dL   Protein, ur NEGATIVE NEGATIVE mg/dL   Nitrite NEGATIVE NEGATIVE   Leukocytes, UA NEGATIVE NEGATIVE  Lactic acid, plasma  Result Value Ref Range   Lactic Acid, Venous 2.0 0.5 - 2.0 mmol/L  Troponin I  Result Value Ref Range   Troponin I <0.03 <0.031 ng/mL   Laboratory interpretation all normal except new  renal insufficiency c/w dehydration     Imaging Review Dg Chest Port 1 View  02/25/2015  CLINICAL DATA:  Acute onset of fever. Found on floor. Status post unwitnessed fall when attempting to get up to go to the bathroom. Initial encounter. EXAM: PORTABLE CHEST 1 VIEW COMPARISON:  Chest radiograph performed 02/12/2015 FINDINGS: The lungs are well-aerated and clear. There is no evidence of focal opacification, pleural effusion or pneumothorax. The cardiomediastinal silhouette is borderline normal in size. The patient is status post median sternotomy, with evidence of prior CABG. There are chronic fractures of multiple sternal wires. No acute osseous abnormalities are seen. IMPRESSION: No acute cardiopulmonary process seen. No displaced rib fractures identified. Electronically Signed   By: Roanna RaiderJeffery  Chang M.D.   On: 02/25/2015 04:34   I have personally reviewed and evaluated these images  and lab results as part of my medical decision-making.   EKG Interpretation   Date/Time:  Monday February 25 2015 03:12:57 EST Ventricular Rate:  108 PR Interval:  164 QRS Duration: 101 QT Interval:  333 QTC Calculation: 446 R Axis:   23 Text Interpretation:  Sinus tachycardia Low voltage, precordial leads  Anteroseptal infarct , old Since last tracing rate slower (25 May 2014)  Confirmed by Bekim Werntz  MD-I, Sundee Garland ([id number]) on 02/25/2015 3:28:54 AM      MDM   Final diagnoses:  Weakness  Fever, unspecified fever cause  Bronchitis  Acute renal insufficiency   Plan admission  Devoria Albe, MD, Concha Pyo, MD 02/25/15 [phone]

## 2015-02-25 NOTE — Progress Notes (Signed)
PT Cancellation Note  Patient Details Name: [name] JarvisSylvester H [name] MRN: [id number] DOB: 1931-08-27   Cancelled Treatment:    Reason Eval/Treat Not Completed: Fatigue/lethargy limiting ability to participate   Virgina OrganCynthia Mckinzee Spirito, PT CLT [phone] 02/25/2015, 11:36 AM

## 2015-02-25 NOTE — Progress Notes (Signed)
ANTIBIOTIC CONSULT NOTE - INITIAL  Pharmacy Consult for Cefepime Indication: UTI  Allergies  Allergen Reactions  . Fexofenadine Hcl Other (See Comments)    Heart attack symptoms  . Flonase [Fluticasone Propionate] Other (See Comments)    Heart attack symptoms  . Nasal Spray Other (See Comments)    Heart attack symptoms   . Nasal Spray     CAN NOT TOLERATE ANY NASAL SPRAYS!!!!!  Causes heart attack symptoms    Patient Measurements: Height: 6' (182.9 cm) Weight: 209 lb 11.2 oz (95.119 kg) IBW/kg (Calculated) : 77.6 Adjusted Body Weight:   Vital Signs: Temp: 98.9 F (37.2 C) (12/26 0518) Temp Source: Oral (12/26 4098) BP: 112/37 mmHg (12/26 0648) Pulse Rate: 72 (12/26 0648) Intake/Output from previous day: 12/25 0701 - 12/26 0700 In: 2000 [I.V.:2000] Out: 200 [Urine:200] Intake/Output from this shift:    Labs:  Recent Labs  02/25/15 0035  WBC 7.7  HGB 12.1*  PLT 191  CREATININE 1.93*   Estimated Creatinine Clearance: 35.9 mL/min (by C-G formula based on Cr of 1.93). No results for input(s): VANCOTROUGH, VANCOPEAK, VANCORANDOM, GENTTROUGH, GENTPEAK, GENTRANDOM, TOBRATROUGH, TOBRAPEAK, TOBRARND, AMIKACINPEAK, AMIKACINTROU, AMIKACIN in the last 72 hours.   Microbiology: Recent Results (from the past 720 hour(s))  Blood Culture (routine x 2)     Status: None (Preliminary result)   Collection Time: 02/25/15  3:35 AM  Result Value Ref Range Status   Specimen Description RIGHT ANTECUBITAL  Final   Special Requests BOTTLES DRAWN AEROBIC AND ANAEROBIC 6CC  Final   Culture PENDING  Incomplete   Report Status PENDING  Incomplete  Blood Culture (routine x 2)     Status: None (Preliminary result)   Collection Time: 02/25/15  4:17 AM  Result Value Ref Range Status   Specimen Description RIGHT ANTECUBITAL  Final   Special Requests BOTTLES DRAWN AEROBIC AND ANAEROBIC 6CC  Final   Culture PENDING  Incomplete   Report Status PENDING  Incomplete    Medical  History: Past Medical History  Diagnosis Date  . Diabetes mellitus type 2   . GERD (gastroesophageal reflux disease)   . Hyperlipidemia     takes Niacin nightly  . DJD (degenerative joint disease)   . Hypertension     takes Metoprolol and Ramipril daily  . Dizziness   . Gout     hx of--54yrs ago  . Bruises easily     pt is on Plavix and ASA  . Shortness of breath     wakes pt up during night with occ shortness of breathe;states it happens about once a month  . Impaired hearing   . Urinary frequency   . UTI (lower urinary tract infection)     hx of--03/2010  . Anemia     takes Glyburide bid  . CAD (coronary artery disease) of artery bypass graft   . Pneumonia     "at least twice that I know of"  . Seizures (HCC) 05/14/11    "first time was today"  . CVA (cerebral vascular accident) [facility]) ~ 2009    right arm/leg occ hurts  . Carotid artery occlusion 2013    s/p bilateral endarterectomy  . Chronic headache   . Chronic neck pain   . Dementia with behavioral disturbance   . Dyspnea     chronic, daily    Medications:  Scheduled:  . allopurinol  300 mg Oral Daily  . aspirin EC  81 mg Oral BID  . ceFEPime (MAXIPIME) IV  2 g Intravenous  Q24H  . clopidogrel  75 mg Oral Daily  . donepezil  10 mg Oral QHS  . enoxaparin (LOVENOX) injection  40 mg Subcutaneous Q24H  . finasteride  5 mg Oral Daily  . memantine  10 mg Oral BID  . metoprolol  50 mg Oral BID  . QUEtiapine  100 mg Oral BID   Assessment: 81 y.o. male with a history CVA, Seizures, HTN, Chronic Headaches, Chronic Bronchitis, and Dementia who was brought to the ED with complaints of increased weakness and fever. Patient had 2 courses of outpatient antibiotics for a UTI, first with Doxycycline followed by Amoxicillin.  Ceftriaxone 2 GM IV for empiric treatment given in ED Antibiotic changed to IV Cefepime  Reduced renal function  Goal of Therapy:  Eradicate infection  Plan:  Cefepime 2 GM IV every 24  hours Monitor renal function Labs per protocol  Raquel JamesPittman, Kaedan Richert Bennett 02/25/2015,8:40 AM

## 2015-02-25 NOTE — Progress Notes (Addendum)
[facility] PROGRESS NOTE  [name] YTK:[id number] DOB: 01/15/1932    PCP:   Elige Radon Dettinger, MD   HPI:  [name] is a 81 y.o. male with a history CVA, Seizures, HTN, Chronic Headaches, Chronic Bronchitis, and Dementia admitted by Dr Lovell Sheehan as he was found on the floor trying to go to the BR.  He lives with his son and likely will return home.  He was found to have a UTI, and was given IV rocephin.  He was still weak today and unable to work with PT.   Rewiew of Systems:  Constitutional: Negative for malaise, fever and chills. No significant weight loss or weight gain Eyes: Negative for eye pain, redness and discharge, diplopia, visual changes, or flashes of light. ENMT: Negative for ear pain, hoarseness, nasal congestion, sinus pressure and sore throat. No headaches; tinnitus, drooling, or problem swallowing. Cardiovascular: Negative for chest pain, palpitations, diaphoresis, dyspnea and peripheral edema. ; No orthopnea, PND Respiratory: Negative for cough, hemoptysis, wheezing and stridor. No pleuritic chestpain. Gastrointestinal: Negative for nausea, vomiting, diarrhea, constipation, abdominal pain, melena, blood in stool, hematemesis, jaundice and rectal bleeding.    Genitourinary: Negative for frequency, dysuria, incontinence,flank pain and hematuria; Musculoskeletal: Negative for back pain and neck pain. Negative for swelling and trauma.;  Skin: . Negative for pruritus, rash, abrasions, bruising and skin lesion.; ulcerations Neuro: Negative for headache, lightheadedness and neck stiffness. Negativ altered level of consciousness , altered mental status, extremity weakness, burning feet, involuntary movement, seizure and syncope.  Psych: negative for anxiety, depression, insomnia, tearfulness, panic attacks, hallucinations, paranoia, suicidal or homicidal ideation    Past Medical History  Diagnosis Date  . Diabetes mellitus type 2   . GERD  (gastroesophageal reflux disease)   . Hyperlipidemia     takes Niacin nightly  . DJD (degenerative joint disease)   . Hypertension     takes Metoprolol and Ramipril daily  . Dizziness   . Gout     hx of--86yrs ago  . Bruises easily     pt is on Plavix and ASA  . Shortness of breath     wakes pt up during night with occ shortness of breathe;states it happens about once a month  . Impaired hearing   . Urinary frequency   . UTI (lower urinary tract infection)     hx of--03/2010  . Anemia     takes Glyburide bid  . CAD (coronary artery disease) of artery bypass graft   . Pneumonia     "at least twice that I know of"  . Seizures (HCC) 05/14/11    "first time was today"  . CVA (cerebral vascular accident) [facility]) ~ 2009    right arm/leg occ hurts  . Carotid artery occlusion 2013    s/p bilateral endarterectomy  . Chronic headache   . Chronic neck pain   . Dementia with behavioral disturbance   . Dyspnea     chronic, daily    Past Surgical History  Procedure Laterality Date  . Appendectomy  1968  . Colonoscopy    . Endarterectomy  04/09/2011    Procedure: ENDARTERECTOMY CAROTID;  Surgeon: Chuck Hint, MD;  Location: [facility] OR;  Service: Vascular;  Laterality: Left;  Left Carotid endarterectomy With Dacron Patch Angioplasty  . Cardiac catheterization  1998    Dr. Antoine Poche  . Endarterectomy  05/08/2011    Procedure: ENDARTERECTOMY CAROTID;  Surgeon: Chuck Hint, MD;  Location: [facility] OR;  Service: Vascular;  Laterality:  Right;  with [place] Patch Angioplasty  . Cataract extraction w/ intraocular lens  implant, bilateral  2012  . Coronary artery bypass graft  1998    LIMA to LAD, SVG to diagonal, SVG to PDA, SVG to posterior lateral.   . Eye surgery  1953    right ; "piece of metal removed"  . Eye surgery  2012    Cataract- Bilateral    Medications:  HOME MEDS: Prior to Admission medications   Medication Sig Start Date End Date Taking? Authorizing Provider   albuterol (PROVENTIL) (2.5 MG/3ML) 0.083% nebulizer solution Take 3 mLs (2.5 mg total) by nebulization every 6 (six) hours as needed for wheezing or shortness of breath. 02/12/15  Yes Frederica Kuster, MD  allopurinol (ZYLOPRIM) 300 MG tablet TAKE 1 TABLET DAILY 02/22/15  Yes Frederica Kuster, MD  amoxicillin (AMOXIL) 500 MG capsule Take 1 capsule (500 mg total) by mouth 3 (three) times daily. 02/19/15  Yes Ernestina Penna, MD  aspirin EC 81 MG tablet Take 81 mg by mouth 2 (two) times daily.   Yes Historical Provider, MD  clopidogrel (PLAVIX) 75 MG tablet TAKE 1 TABLET DAILY 02/06/15  Yes Rollene Rotunda, MD  donepezil (ARICEPT) 10 MG tablet TAKE 1 TABLET AT BEDTIME 01/25/15  Yes Frederica Kuster, MD  finasteride (PROSCAR) 5 MG tablet TAKE 1 TABLET DAILY 02/18/15  Yes Frederica Kuster, MD  furosemide (LASIX) 20 MG tablet Take 1 tablet (20 mg total) by mouth daily as needed for fluid. 10/10/14  Yes Frederica Kuster, MD  glyBURIDE (DIABETA) 2.5 MG tablet Take 1 tablet (2.5 mg total) by mouth 2 (two) times daily with a meal. 12/04/14  Yes Frederica Kuster, MD  HYDROcodone-homatropine [facility]) 5-1.5 MG/5ML syrup Take 5 mLs by mouth every 6 (six) hours as needed for cough. 02/12/15  Yes Frederica Kuster, MD  ibuprofen (ADVIL,MOTRIN) 200 MG tablet Take 200 mg by mouth every 6 (six) hours as needed for headache or mild pain.    Yes Historical Provider, MD  LORazepam (ATIVAN) 0.5 MG tablet Take 1 tablet (0.5 mg total) by mouth 2 (two) times daily. Patient taking differently: Take 0.5 mg by mouth 2 (two) times daily as needed for anxiety.  02/12/15  Yes Frederica Kuster, MD  memantine (NAMENDA) 10 MG tablet Take 10 mg by mouth 2 (two) times daily.   Yes Historical Provider, MD  metFORMIN (GLUCOPHAGE) 1000 MG tablet Take 1 tablet (1,000 mg total) by mouth 2 (two) times daily with a meal. 01/16/15  Yes Frederica Kuster, MD  metoprolol (LOPRESSOR) 50 MG tablet Take 1 tablet (50 mg total) by mouth 2 (two) times  daily. 12/11/14  Yes Frederica Kuster, MD  QUEtiapine (SEROQUEL) 50 MG tablet TAKE 2 TABLETS IN THE MORNING AND 3 TABLETS AT BEDTIME 12/18/14  Yes Frederica Kuster, MD  Umeclidinium Bromide (INCRUSE ELLIPTA) 62.5 MCG/INH AEPB Inhale 1 Inhaler into the lungs daily. 01/16/15  Yes Frederica Kuster, MD     Allergies:  Allergies  Allergen Reactions  . Fexofenadine Hcl Other (See Comments)    Heart attack symptoms  . Flonase [Fluticasone Propionate] Other (See Comments)    Heart attack symptoms  . Nasal Spray Other (See Comments)    Heart attack symptoms   . Nasal Spray     CAN NOT TOLERATE ANY NASAL SPRAYS!!!!!  Causes heart attack symptoms    Social History:   reports that he quit smoking about 41 years ago. His  smoking use included Cigarettes. He has a 20 pack-year smoking history. He has never used smokeless tobacco. He reports that he does not drink alcohol or use illicit drugs.  Family History: Family History  Problem Relation Age of Onset  . Diabetes Sister   . Cancer Brother   . Diabetes Brother   . Anesthesia problems Neg Hx   . Hypotension Neg Hx   . Malignant hyperthermia Neg Hx   . Pseudochol deficiency Neg Hx   . Hearing loss Father      Physical Exam: Filed Vitals:   02/25/15 0600 02/25/15 0648 02/25/15 1009 02/25/15 1442  BP: 122/51 112/37 118/54 116/48  Pulse:  72 72 72  Temp:    97.4 F (36.3 C)  TempSrc:  Oral  Oral  Resp: Height:  6' (1.829 m)    Weight:  95.119 kg (209 lb 11.2 oz)    SpO2:  96%  100%   Blood pressure 116/48, pulse 72, temperature 97.4 F (36.3 C), temperature source Oral, resp. rate 20, height 6' (1.829 m), weight 95.119 kg (209 lb 11.2 oz), SpO2 100 %.  GEN:  Pleasant patient lying in the stretcher in no acute distress; cooperative with exam. PSYCH:  alert and oriented x4; does not appear anxious or depressed; affect is appropriate. HEENT: Mucous membranes pink and anicteric; PERRLA; EOM intact; no cervical  lymphadenopathy nor thyromegaly or carotid bruit; no JVD; There were no stridor. Neck is very supple. Breasts:: Not examined CHEST WALL: No tenderness CHEST: Normal respiration, clear to auscultation bilaterally.  HEART: Regular rate and rhythm.  There are no murmur, rub, or gallops.   BACK: No kyphosis or scoliosis; no CVA tenderness ABDOMEN: soft and non-tender; no masses, no organomegaly, normal abdominal bowel sounds; no pannus; no intertriginous candida. There is no rebound and no distention. Rectal Exam: Not done EXTREMITIES: No bone or joint deformity; age-appropriate arthropathy of the hands and knees; no edema; no ulcerations.  There is no calf tenderness. Genitalia: not examined PULSES: 2+ and symmetric SKIN: Normal hydration no rash or ulceration CNS: Cranial nerves 2-12 grossly intact no focal lateralizing neurologic deficit.  Speech is fluent; uvula elevated with phonation, facial symmetry and tongue midline. DTR are normal bilaterally, cerebella exam is intact, barbinski is negative and strengths are equaled bilaterally.  No sensory loss.   Labs on Admission:  Basic Metabolic Panel:  Recent Labs Lab 02/25/15 0035  NA 136  K 4.2  CL 100*  CO2 26  GLUCOSE 99  BUN 29*  CREATININE 1.93*  CALCIUM 8.4*   Liver Function Tests:  Recent Labs Lab 02/25/15 0035  AST 18  ALT 19  ALKPHOS 44  BILITOT 0.3  PROT 6.7  ALBUMIN 3.5   CBC:  Recent Labs Lab 02/25/15 0035  WBC 7.7  NEUTROABS 5.5  HGB 12.1*  HCT 36.7*  MCV 94.3  PLT 191   Cardiac Enzymes:  Recent Labs Lab 02/25/15 0335  TROPONINI <0.03   Radiological Exams on Admission: Dg Chest Port 1 View  02/25/2015  CLINICAL DATA:  Acute onset of fever. Found on floor. Status post unwitnessed fall when attempting to get up to go to the bathroom. Initial encounter. EXAM: PORTABLE CHEST 1 VIEW COMPARISON:  Chest radiograph performed 02/12/2015 FINDINGS: The lungs are well-aerated and clear. There is no evidence  of focal opacification, pleural effusion or pneumothorax. The cardiomediastinal silhouette is borderline normal in size. The patient is status post median sternotomy, with evidence of prior CABG.  There are chronic fractures of multiple sternal wires. No acute osseous abnormalities are seen. IMPRESSION: No acute cardiopulmonary process seen. No displaced rib fractures identified. Electronically Signed   By: Roanna RaiderJeffery  Chang M.D.   On: 02/25/2015 04:34   Assessment/Plan Present on Admission:  . SIRS (systemic inflammatory response syndrome) (HCC) . Urinary tract infection, site not specified . Diabetes mellitus due to underlying condition without complications (HCC) . Encephalopathy acute . Fall . Hypertension . AKI (acute kidney injury) (HCC)  PLAN:  Urinary tract infection, site not specified Urine C+S IV Cefepime    AKI  (acute kidney injury) (HCC) Gentle IVFs Monitor IVFs Hold lasix Rx   Fall Fall precautions Physical Therapy evaluation  Diabetes mellitus due to underlying condition without complications (HCC) Hold Oral DM2 Meds SSI coverage PRN Check Hba1C   Hypertension Monitor BPs    Dementia Continue Aricept and Namenda Rx    DVT Prophylaxis Lovenox    Code Status: FULL CODE  Family Communication: No Family Present  Disposition Plan: Inpatient Status   Cynthia Cogle, MD.  FACP [facility] Pager [phone] 7pm to 7am.  02/25/2015, 3:57 PM   ADDENDUM:  Son was upset that no definite source of his fever was found.  I explained that it is not easy when patient has fever of unclear etiology.  Closer hx was obtained.  No GI or GU  symptoms.  Bifrontal HA and Max sinus HA and tenderness.  Heart with no murmur.  No decubitus seen.  No stigmata for endocarditis.  [facility], but after speaking with family, since he is stable. Will obtain sinus CT and abdominal pelvic CT.  Follow BC and urine culture, obtain PSA (prostatitis?), along with broaden coverage by adding Vancomycin.  Note that his lactic acid was and calcitonin was negative.

## 2015-02-25 NOTE — ED Notes (Signed)
Pt arrives via [place] [facility] with c/o fever at home.  Per ems, family members found pt on the floor by his bed where he states he tried to get up to go to the bathroom.  Pt is awake and alert, c/o headache.

## 2015-02-25 NOTE — Care Management Note (Signed)
Case Management Note  Patient Details  Name: [name] JarvisSylvester H [name] MRN: [id number] Date of Birth: [birth year]  Subjective/Objective:                  Pt admitted from home with SIRS. Pt lives with his son and will return home at discharge. Pt is fairly independent with ADL's. Pt has a cane and walker for prn use. Pt has an aide that comes in 3-4 days a week to assist with cleaning.  Action/Plan: Will continue to follow for discharge planning needs. PT eval pending.  Expected Discharge Date:                  Expected Discharge Plan:  Home w Home Health Services  In-House Referral:  NA  Discharge planning Services  CM Consult  Post Acute Care Choice:    Choice offered to:     DME Arranged:    DME Agency:     HH Arranged:    HH Agency:     Status of Service:  In process, will continue to follow  Medicare Important Message Given:    Date Medicare IM Given:    Medicare IM give by:    Date Additional Medicare IM Given:    Additional Medicare Important Message give by:     If discussed at Long Length of Stay Meetings, dates discussed:    Additional Comments:  Cheryl FlashBlackwell, Najir Roop Crowder, RN 02/25/2015, 3:06 PM

## 2015-02-25 NOTE — Progress Notes (Signed)
ANTIBIOTIC CONSULT NOTE - INITIAL  Pharmacy Consult for Vancomycin Indication: UTI & Fever Unknown Origin  Allergies  Allergen Reactions  . Fexofenadine Hcl Other (See Comments)    Heart attack symptoms  . Flonase [Fluticasone Propionate] Other (See Comments)    Heart attack symptoms  . Nasal Spray Other (See Comments)    Heart attack symptoms   . Nasal Spray     CAN NOT TOLERATE ANY NASAL SPRAYS!!!!!  Causes heart attack symptoms    Patient Measurements: Height: 6' (182.9 cm) Weight: 209 lb 11.2 oz (95.119 kg) IBW/kg (Calculated) : 77.6 Adjusted Body Weight:   Vital Signs: Temp: 97.4 F (36.3 C) (12/26 1442) Temp Source: Oral (12/26 1442) BP: 116/48 mmHg (12/26 1442) Pulse Rate: 72 (12/26 1442) Intake/Output from previous day: 12/25 0701 - 12/26 0700 In: 2000 [I.V.:2000] Out: 200 [Urine:200] Intake/Output from this shift:    Labs:  Recent Labs  02/25/15 0035  WBC 7.7  HGB 12.1*  PLT 191  CREATININE 1.93*   Estimated Creatinine Clearance: 35.9 mL/min (by C-G formula based on Cr of 1.93). No results for input(s): VANCOTROUGH, VANCOPEAK, VANCORANDOM, GENTTROUGH, GENTPEAK, GENTRANDOM, TOBRATROUGH, TOBRAPEAK, TOBRARND, AMIKACINPEAK, AMIKACINTROU, AMIKACIN in the last 72 hours.   Microbiology: Recent Results (from the past 720 hour(s))  Blood Culture (routine x 2)     Status: None (Preliminary result)   Collection Time: 02/25/15  3:35 AM  Result Value Ref Range Status   Specimen Description RIGHT ANTECUBITAL  Final   Special Requests BOTTLES DRAWN AEROBIC AND ANAEROBIC 6CC  Final   Culture NO GROWTH < 12 HOURS  Final   Report Status PENDING  Incomplete  Blood Culture (routine x 2)     Status: None (Preliminary result)   Collection Time: 02/25/15  4:17 AM  Result Value Ref Range Status   Specimen Description RIGHT ANTECUBITAL  Final   Special Requests BOTTLES DRAWN AEROBIC AND ANAEROBIC 6CC  Final   Culture NO GROWTH < 12 HOURS  Final   Report Status  PENDING  Incomplete    Medical History: Past Medical History  Diagnosis Date  . Diabetes mellitus type 2   . GERD (gastroesophageal reflux disease)   . Hyperlipidemia     takes Niacin nightly  . DJD (degenerative joint disease)   . Hypertension     takes Metoprolol and Ramipril daily  . Dizziness   . Gout     hx of--5223yrs ago  . Bruises easily     pt is on Plavix and ASA  . Shortness of breath     wakes pt up during night with occ shortness of breathe;states it happens about once a month  . Impaired hearing   . Urinary frequency   . UTI (lower urinary tract infection)     hx of--03/2010  . Anemia     takes Glyburide bid  . CAD (coronary artery disease) of artery bypass graft   . Pneumonia     "at least twice that I know of"  . Seizures (HCC) 05/14/11    "first time was today"  . CVA (cerebral vascular accident) [facility](HCC) ~ 2009    right arm/leg occ hurts  . Carotid artery occlusion 2013    s/p bilateral endarterectomy  . Chronic headache   . Chronic neck pain   . Dementia with behavioral disturbance   . Dyspnea     chronic, daily    Medications:  Scheduled:  . allopurinol  300 mg Oral Daily  . aspirin EC  81  mg Oral BID  . ceFEPime (MAXIPIME) IV  2 g Intravenous Q24H  . clopidogrel  75 mg Oral Daily  . donepezil  10 mg Oral QHS  . enoxaparin (LOVENOX) injection  40 mg Subcutaneous Q24H  . finasteride  5 mg Oral Daily  . memantine  10 mg Oral BID  . metoprolol  50 mg Oral BID  . QUEtiapine  100 mg Oral BID  . vancomycin  1,250 mg Intravenous Q24H  . vancomycin  1,500 mg Intravenous Once   Assessment: 81 y.o. male with a history CVA, Seizures, HTN, Chronic Headaches, Chronic Bronchitis, and Dementia who was brought to the ED with complaints of increased weakness and fever. Patient had 2 courses of outpatient antibiotics for a UTI, first with Doxycycline followed by Amoxicillin.  Ceftriaxone 2 GM IV for empiric treatment given in ED Antibiotic changed to IV  Cefepime  Reduced renal function Vancomycin to be added  Goal of Therapy:  Eradicate infection Vancomycin trough 15-20  Plan:  Vancomycin 1500 mg IV loading dose, then 1250 mg IV every 24 hours Monitor renal function Labs per protocol  Raquel James, Sunny Aguon Bennett 02/25/2015,6:27 PM

## 2015-02-25 NOTE — H&P (Addendum)
[facility] Admission History and Physical       [name] WUJ:[id number] DOB: [birth year] DOA: 02/25/2015  Referring physician: EDP PCP: Elige Radon Dettinger, MD Dr. Jacalyn Lefevre Specialists:   Chief Complaint:   Weakness and Fever  HPI: [name] is a 81 y.o. male with a history CVA, Seizures, HTN, Chronic  Headaches, Chronic Bronchitis, and Dementia who was brought to the ED with complaints of increased weakness and fever over the past few days.   He reports that he has not felt well for a few weeks.  He had been found on the floor by his family this evening after he tried to go use the bathroom.    He has had 2 courses of outpatient  antibiotics for a UTI, first with Doxycycline followed by Amoxicllin.   His family reports that he had fever to 102 at home, and he was given Tylenol.   In the ED, his temperature was 101.7.   A Sepsis workup was initiated and he was administered 2 grams of IV Rocephin for empiric treatment for a partially treated UTI.   He was referred for admission.        Review of Systems: Unable to Obtain from the Patient  Past Medical History  Diagnosis Date  . Diabetes mellitus type 2   . GERD (gastroesophageal reflux disease)   . Hyperlipidemia     takes Niacin nightly  . DJD (degenerative joint disease)   . Hypertension     takes Metoprolol and Ramipril daily  . Dizziness   . Gout     hx of--73yrs ago  . Bruises easily     pt is on Plavix and ASA  . Shortness of breath     wakes pt up during night with occ shortness of breathe;states it happens about once a month  . Impaired hearing   . Urinary frequency   . UTI (lower urinary tract infection)     hx of--03/2010  . Anemia     takes Glyburide bid  . CAD (coronary artery disease) of artery bypass graft   . Pneumonia     "at least twice that I know of"  . Seizures (HCC) 05/14/11    "first time was today"  . CVA (cerebral vascular accident) [facility]) ~ 2009    right arm/leg  occ hurts  . Carotid artery occlusion 2013    s/p bilateral endarterectomy  . Chronic headache   . Chronic neck pain   . Dementia with behavioral disturbance   . Dyspnea     chronic, daily     Past Surgical History  Procedure Laterality Date  . Appendectomy  1968  . Colonoscopy    . Endarterectomy  04/09/2011    Procedure: ENDARTERECTOMY CAROTID;  Surgeon: Chuck Hint, MD;  Location: [facility] OR;  Service: Vascular;  Laterality: Left;  Left Carotid endarterectomy With Dacron Patch Angioplasty  . Cardiac catheterization  1998    Dr. Antoine Poche  . Endarterectomy  05/08/2011    Procedure: ENDARTERECTOMY CAROTID;  Surgeon: Chuck Hint, MD;  Location: [facility] OR;  Service: Vascular;  Laterality: Right;  with [place] Patch Angioplasty  . Cataract extraction w/ intraocular lens  implant, bilateral  2012  . Coronary artery bypass graft  1998    LIMA to LAD, SVG to diagonal, SVG to PDA, SVG to posterior lateral.   . Eye surgery  1953    right ; "piece of metal removed"  . Eye surgery  2012  Cataract- Bilateral      Prior to Admission medications   Medication Sig Start Date End Date Taking? Authorizing Provider  albuterol (PROVENTIL) (2.5 MG/3ML) 0.083% nebulizer solution Take 3 mLs (2.5 mg total) by nebulization every 6 (six) hours as needed for wheezing or shortness of breath. 02/12/15   Frederica Kuster, MD  allopurinol (ZYLOPRIM) 300 MG tablet TAKE 1 TABLET DAILY 02/22/15   Frederica Kuster, MD  amoxicillin (AMOXIL) 500 MG capsule Take 1 capsule (500 mg total) by mouth 3 (three) times daily. 02/19/15   Ernestina Penna, MD  aspirin EC 81 MG tablet Take 81 mg by mouth 2 (two) times daily.    Historical Provider, MD  clopidogrel (PLAVIX) 75 MG tablet TAKE 1 TABLET DAILY 02/06/15   Rollene Rotunda, MD  donepezil (ARICEPT) 10 MG tablet TAKE 1 TABLET AT BEDTIME 01/25/15   Frederica Kuster, MD  doxycycline (VIBRA-TABS) 100 MG tablet Take 1 tablet (100 mg total) by mouth 2 (two) times  daily. 02/12/15   Frederica Kuster, MD  finasteride (PROSCAR) 5 MG tablet TAKE 1 TABLET DAILY 02/18/15   Frederica Kuster, MD  furosemide (LASIX) 20 MG tablet Take 1 tablet (20 mg total) by mouth daily as needed for fluid. 10/10/14   Frederica Kuster, MD  glyBURIDE (DIABETA) 2.5 MG tablet Take 1 tablet (2.5 mg total) by mouth 2 (two) times daily with a meal. 12/04/14   Frederica Kuster, MD  HYDROcodone-homatropine [facility]) 5-1.5 MG/5ML syrup Take 5 mLs by mouth every 6 (six) hours as needed for cough. 02/12/15   Frederica Kuster, MD  ibuprofen (ADVIL,MOTRIN) 200 MG tablet Take 200 mg by mouth every 6 (six) hours as needed for mild pain.    Historical Provider, MD  LORazepam (ATIVAN) 0.5 MG tablet Take 1 tablet (0.5 mg total) by mouth 2 (two) times daily. 02/12/15   Frederica Kuster, MD  memantine (NAMENDA) 10 MG tablet Take 10 mg by mouth 2 (two) times daily.    Historical Provider, MD  metFORMIN (GLUCOPHAGE) 1000 MG tablet Take 1 tablet (1,000 mg total) by mouth 2 (two) times daily with a meal. 01/16/15   Frederica Kuster, MD  metoprolol (LOPRESSOR) 50 MG tablet Take 1 tablet (50 mg total) by mouth 2 (two) times daily. 12/11/14   Frederica Kuster, MD  QUEtiapine (SEROQUEL) 50 MG tablet TAKE 2 TABLETS IN THE MORNING AND 3 TABLETS AT BEDTIME 12/18/14   Frederica Kuster, MD  Umeclidinium Bromide (INCRUSE ELLIPTA) 62.5 MCG/INH AEPB Inhale 1 Inhaler into the lungs daily. 01/16/15   Frederica Kuster, MD     Allergies  Allergen Reactions  . Fexofenadine Hcl Other (See Comments)    Heart attack symptoms  . Flonase [Fluticasone Propionate] Other (See Comments)    Heart attack symptoms  . Nasal Spray Other (See Comments)    Heart attack symptoms   . Nasal Spray     CAN NOT TOLERATE ANY NASAL SPRAYS!!!!!  Causes heart attack symptoms    Social History:  reports that he quit smoking about 41 years ago. His smoking use included Cigarettes. He has a 20 pack-year smoking history. He has never used  smokeless tobacco. He reports that he does not drink alcohol or use illicit drugs.    Family History  Problem Relation Age of Onset  . Diabetes Sister   . Cancer Brother   . Diabetes Brother   . Anesthesia problems Neg Hx   . Hypotension Neg Hx   .  Malignant hyperthermia Neg Hx   . Pseudochol deficiency Neg Hx   . Hearing loss Father        Physical Exam:  GEN:  Pleasant Elderly Obese 81 y.o. Caucasian male examined and in no acute distress; cooperative with exam Filed Vitals:   02/25/15 0400 02/25/15 0500 02/25/15 0518 02/25/15 0530  BP: 130/84 132/47  125/55  Pulse: 101 94    Temp:   98.9 F (37.2 C)   TempSrc:   Oral   Resp: 20 25  23   SpO2: 84% 96%     Blood pressure 125/55, pulse 94, temperature 98.9 F (37.2 C), temperature source Oral, resp. rate 23, SpO2 96 %. PSYCH: He is alert and oriented x 2; does not appear anxious does not appear depressed; affect is normal HEENT: Normocephalic and Atraumatic, Mucous membranes pink; PERRLA; EOM intact; Fundi:  Benign;  No scleral icterus, Nares: Patent, Oropharynx: Clear,    Neck:  FROM, No Cervical Lymphadenopathy nor Thyromegaly or Carotid Bruit; No JVD; Breasts:: Not examined CHEST WALL: No tenderness CHEST: Normal respiration, clear to auscultation bilaterally HEART: Regular rate and rhythm; no murmurs rubs or gallops BACK: No kyphosis or scoliosis; No CVA tenderness ABDOMEN: Positive Bowel Sounds,  Obese, Soft Non-Tender, No Rebound or Guarding; No Masses, No Organomegaly. Rectal Exam: Not done EXTREMITIES: No Cyanosis, Clubbing, or Edema; No Ulcerations. Genitalia: not examined PULSES: 2+ and symmetric SKIN: Normal hydration no rash or ulceration CNS:  Alert and Oriented x 2, No Focal Deficits Vascular: pulses palpable throughout    Labs on Admission:  Basic Metabolic Panel:  Recent Labs Lab 02/25/15 0035  NA 136  K 4.2  CL 100*  CO2 26  GLUCOSE 99  BUN 29*  CREATININE 1.93*  CALCIUM 8.4*   Liver  Function Tests:  Recent Labs Lab 02/25/15 0035  AST 18  ALT 19  ALKPHOS 44  BILITOT 0.3  PROT 6.7  ALBUMIN 3.5   No results for input(s): LIPASE, AMYLASE in the last 168 hours. No results for input(s): AMMONIA in the last 168 hours. CBC:  Recent Labs Lab 02/25/15 0035  WBC 7.7  NEUTROABS 5.5  HGB 12.1*  HCT 36.7*  MCV 94.3  PLT 191   Cardiac Enzymes:  Recent Labs Lab 02/25/15 0335  TROPONINI <0.03    BNP (last 3 results)  Recent Labs  04/10/14 2108 05/28/14 2329  BNP 21.0 32.0    ProBNP (last 3 results) No results for input(s): PROBNP in the last 8760 hours.  CBG: No results for input(s): GLUCAP in the last 168 hours.  Radiological Exams on Admission: Dg Chest Port 1 View  02/25/2015  CLINICAL DATA:  Acute onset of fever. Found on floor. Status post unwitnessed fall when attempting to get up to go to the bathroom. Initial encounter. EXAM: PORTABLE CHEST 1 VIEW COMPARISON:  Chest radiograph performed 02/12/2015 FINDINGS: The lungs are well-aerated and clear. There is no evidence of focal opacification, pleural effusion or pneumothorax. The cardiomediastinal silhouette is borderline normal in size. The patient is status post median sternotomy, with evidence of prior CABG. There are chronic fractures of multiple sternal wires. No acute osseous abnormalities are seen. IMPRESSION: No acute cardiopulmonary process seen. No displaced rib fractures identified. Electronically Signed   By: Roanna RaiderJeffery  Chang M.D.   On: 02/25/2015 04:34     EKG: Independently reviewed.     Assessment/Plan:      81 y.o. male with  Principal Problem:   SIRS (systemic inflammatory response syndrome) (HCC)  Sepsis Workup   Antibiotics changed to IV Cefepime    Active Problems:   Encephalopathy acute   monitor     Weakness due to Illness   Antibiotic Rx and IVFs      Urinary tract infection, site not specified   Urine C+S   IV Cefepime      AKI (acute kidney injury)  (HCC)   Gentle IVFs   Monitor IVFs   Hold lasix Rx     Fall   Fall precautions   Physical Therapy evaluation       Diabetes mellitus due to underlying condition without complications (HCC)   Hold Oral DM2 Meds   SSI coverage PRN   Check Hba1C       Hypertension   Monitor BPs     Dementia   Continue Aricept and Namenda Rx       DVT Prophylaxis   Lovenox    Code Status:     FULL CODE      Family Communication:   No Family Present    Disposition Plan:    Inpatient Status        Time spent:  84 Minutes     Ron Parker [facility] Pager [phone]   If 7AM -7PM Please Contact the Day Rounding Team MD for [facility]  If 7PM-7AM, Please Contact Night-Floor Coverage  [email or web address] Password [facility] 02/25/2015, 5:39 AM     ADDENDUM:   Patient was seen and examined on 02/25/2015

## 2015-02-26 DIAGNOSIS — W19XXXS Unspecified fall, sequela: Secondary | ICD-10-CM

## 2015-02-26 LAB — BASIC METABOLIC PANEL
ANION GAP: 7 (ref 5–15)
BUN: 23 mg/dL — ABNORMAL HIGH (ref 6–20)
CHLORIDE: 103 mmol/L (ref 101–111)
CO2: 25 mmol/L (ref 22–32)
Calcium: 7.5 mg/dL — ABNORMAL LOW (ref 8.9–10.3)
Creatinine, Ser: 1.47 mg/dL — ABNORMAL HIGH (ref 0.61–1.24)
GFR calc non Af Amer: 43 mL/min — ABNORMAL LOW (ref >60)
GFR, EST AFRICAN AMERICAN: 50 mL/min — AB (ref >60)
Glucose, Bld: 60 mg/dL — ABNORMAL LOW (ref 65–99)
POTASSIUM: 3.8 mmol/L (ref 3.5–5.1)
Sodium: 135 mmol/L (ref 135–145)

## 2015-02-26 LAB — GLUCOSE, CAPILLARY: Glucose-Capillary: 66 mg/dL (ref 65–99)

## 2015-02-26 LAB — CBC
HEMATOCRIT: 31.9 % — AB (ref 39.0–52.0)
HEMOGLOBIN: 10.8 g/dL — AB (ref 13.0–17.0)
MCH: 32 pg (ref 26.0–34.0)
MCHC: 33.9 g/dL (ref 30.0–36.0)
MCV: 94.4 fL (ref 78.0–100.0)
Platelets: 165 10*3/uL (ref 150–400)
RBC: 3.38 MIL/uL — AB (ref 4.22–5.81)
RDW: 15.2 % (ref 11.5–15.5)
WBC: 5.1 10*3/uL (ref 4.0–10.5)

## 2015-02-26 LAB — URINE CULTURE: CULTURE: NO GROWTH

## 2015-02-26 LAB — PSA: PSA: 4.76 ng/mL — AB (ref 0.00–4.00)

## 2015-02-26 MED ORDER — DIPHENHYDRAMINE HCL 12.5 MG/5ML PO ELIX: 12.5000 mg | ORAL_SOLUTION | Freq: Every evening | ORAL | Status: DC | PRN

## 2015-02-26 MED ORDER — AMOXICILLIN-POT CLAVULANATE 875-125 MG PO TABS
1.0000 | ORAL_TABLET | Freq: Two times a day (BID) | ORAL | Status: DC
  Administered 2015-02-26 – 2015-02-27 (×2): 1 via ORAL
  Filled 2015-02-26 (×2): qty 1

## 2015-02-26 MED ORDER — ZOLPIDEM TARTRATE 5 MG PO TABS
5.0000 mg | ORAL_TABLET | Freq: Every evening | ORAL | Status: DC | PRN
  Administered 2015-02-26: 5 mg via ORAL
  Filled 2015-02-26: qty 1

## 2015-02-26 NOTE — Progress Notes (Signed)
Inpatient Diabetes Program Recommendations  AACE/ADA: New Consensus Statement on Inpatient Glycemic Control (2015)  Target Ranges:  Prepandial:   less than 140 mg/dL      Peak postprandial:   less than 180 mg/dL (1-2 hours)      Critically ill patients:  140 - 180 mg/dL   Review of Glycemic Control:  Outpatient Diabetes medications: Glyburide, Metformin Current orders for Inpatient glycemic control: none Inpatient Diabetes Program Recommendations:    Please monitor CBGs achs.  Pt hypoglycemic this morning with lab glucose 60. Thank you  Piedad ClimesGina Joury Allcorn BSN, RN,CDE Inpatient Diabetes Coordinator [phone] (team pager)

## 2015-02-26 NOTE — Evaluation (Signed)
Physical Therapy Evaluation Patient Details Name: [name] MRN: [id number] DOB: 1931/09/12 Today's Date: 02/26/2015   History of Present Illness  [name] is a 81 y.o. male with a history CVA, Seizures, HTN, Chronic Headaches, Chronic Bronchitis, and Dementia who was brought to the ED with complaints of increased weakness and fever over the past few days. He reports that he has not felt well for a few weeks. He had been found on the floor by his family this evening after he tried to go use the bathroom. He has had 2 courses of outpatient antibiotics for a UTI, first with Doxycycline followed by Amoxicllin. His family reports that he had fever to 102 at home, and he was given Tylenol. In the ED, his temperature was 101.7. A Sepsis workup was initiated and he was administered 2 grams of IV Rocephin for empiric treatment for a partially treated UTI. He was referred for admission.   Clinical Impression   Pt was seen for evaluation.  He was alert and oriented, able to follow directions but has a hx of dementia.  His daughter (with whom he lives) was present.  He was found to have generalized weakness and deconditioning.  His left thigh is painful to the touch (6/10) and he reports pain with movement of the left leg.  He was able to weight bear on the LLE without pain for about 10' but then reported mild pain in that thigh.  There did not appear to be any significant edema in the painful region.  Hip ROM was WNL.  He was only able to ambulate 20' due to weakness.  Although he would qualify for SNF the family would like to take him home at d/c.     Follow Up Recommendations Home health PT    Equipment Recommendations  Wheelchair (measurements PT);3in1 (PT)    Recommendations for Other Services   none    Precautions / Restrictions Precautions Precautions: Fall Restrictions Weight Bearing Restrictions: No      Mobility  Bed Mobility Overal bed mobility:  Modified Independent                Transfers Overall transfer level: Needs assistance Equipment used: Rolling walker (2 wheeled) Transfers: Sit to/from Stand Sit to Stand: Min guard            Ambulation/Gait Ambulation/Gait assistance: Min guard Ambulation Distance (Feet): 20 Feet Assistive device: Rolling walker (2 wheeled) Gait Pattern/deviations: Trunk flexed;Decreased stride length   Gait velocity interpretation: <1.8 ft/sec, indicative of risk for recurrent falls General Gait Details: pt began to have mild left thigh pain with weight bearing after walking about 10'  Stairs            Wheelchair Mobility    Modified Rankin (Stroke Patients Only)       Balance Overall balance assessment: Needs assistance Sitting-balance support: No upper extremity supported;Feet supported Sitting balance-Leahy Scale: Good     Standing balance support: Bilateral upper extremity supported Standing balance-Leahy Scale: Fair                               Pertinent Vitals/Pain Pain Assessment: 0-10 Pain Score: 6  Pain Location: headache Pain Descriptors / Indicators: Headache Pain Intervention(s): Limited activity within patient's tolerance;Patient requesting pain meds-RN notified;RN gave pain meds during session    Home Living Family/patient expects to be discharged to:: Private residence Living Arrangements: Children Available Help at Discharge: Family;Available  24 hours/day Type of Home: House Home Access: Ramped entrance     Home Layout: One level Home Equipment: Cane - single point;Walker - 2 wheels      Prior Function Level of Independence: Independent with assistive device(s)         Comments: 3 months ago pt was totally independent in the community.. has had a progressive decline since then per daughter     Hand Dominance        Extremity/Trunk Assessment   Upper Extremity Assessment: Generalized weakness            Lower Extremity Assessment: Generalized weakness      Cervical / Trunk Assessment: Kyphotic  Communication   Communication: No difficulties  Cognition Arousal/Alertness: Awake/alert Behavior During Therapy: WFL for tasks assessed/performed Overall Cognitive Status: History of cognitive impairments - at baseline                      General Comments      Exercises        Assessment/Plan    PT Assessment Patient needs continued PT services  PT Diagnosis Difficulty walking;Generalized weakness   PT Problem List Decreased strength;Decreased activity tolerance;Decreased balance;Decreased mobility;Pain  PT Treatment Interventions Gait training;Functional mobility training;Therapeutic exercise   PT Goals (Current goals can be found in the Care Plan section) Acute Rehab PT Goals Patient Stated Goal: daughter wants pt to come home and be stronger PT Goal Formulation: With patient/family Time For Goal Achievement: 03/12/15 Potential to Achieve Goals: Good    Frequency Min 3X/week   Barriers to discharge   none    Co-evaluation               End of Session Equipment Utilized During Treatment: Gait belt Activity Tolerance: Patient limited by fatigue Patient left: in chair;with call bell/phone within reach;with nursing/sitter in room Nurse Communication: Mobility status         Time: [id number] PT Time Calculation (min) (ACUTE ONLY): 42 min   Charges:   PT Evaluation $Initial PT Evaluation Tier I: 1 Procedure     PT G CodesMyrlene Broker:        Delphin Funes L  PT 02/26/2015, 10:33 AM [phone]

## 2015-02-26 NOTE — Progress Notes (Signed)
[facility] PROGRESS NOTE  RUCKER PRIDGEON MVH:[id number] DOB: 05-Nov-1931    PCP:   Elige Radon Dettinger, MD   HPI: [name] is an 81 y.o. male was admitted by Dr Lovell Sheehan for actually fever of unclear source, and was empirically tx for "UTI",  However, his UA was negative, and after further history, he had intermittent fever and chills, with negative CXR and UA.  He told me that he has chronic HA, and they were actually bi-frontal and bilateral maxillary sinuses.  They are tender there.  CT of the sinuses were positive for acute on chronic sinusitis.  I was going to order abd/pelvic CT for the FUO work up, but he really had no GI or GU symptoms, so it was cancelled.  Daughter in law wanted to take him home today, as he likes to be home.  He agreed to stay another day to follow his fever curve, and to try oral antibiotics to see if he has any side effects.    Rewiew of Systems:  Constitutional: Negative for malaise, fever and chills. No significant weight loss or weight gain Eyes: Negative for eye pain, redness and discharge, diplopia, visual changes, or flashes of light. ENMT: Negative for ear pain, hoarseness, nasal congestion, sinus pressure and sore throat. No headaches; tinnitus, drooling, or problem swallowing. Cardiovascular: Negative for chest pain, palpitations, diaphoresis, dyspnea and peripheral edema. ; No orthopnea, PND Respiratory: Negative for cough, hemoptysis, wheezing and stridor. No pleuritic chestpain. Gastrointestinal: Negative for nausea, vomiting, diarrhea, constipation, abdominal pain, melena, blood in stool, hematemesis, jaundice and rectal bleeding.    Genitourinary: Negative for frequency, dysuria, incontinence,flank pain and hematuria; Musculoskeletal: Negative for back pain and neck pain. Negative for swelling and trauma.;  Skin: . Negative for pruritus, rash, abrasions, bruising and skin lesion.; ulcerations Neuro: Negative for headache,  lightheadedness and neck stiffness. Negative for weakness, altered level of consciousness , altered mental status, extremity weakness, burning feet, involuntary movement, seizure and syncope.  Psych: negative for anxiety, depression, insomnia, tearfulness, panic attacks, hallucinations, paranoia, suicidal or homicidal ideation    Past Medical History  Diagnosis Date  . Diabetes mellitus type 2   . GERD (gastroesophageal reflux disease)   . Hyperlipidemia     takes Niacin nightly  . DJD (degenerative joint disease)   . Hypertension     takes Metoprolol and Ramipril daily  . Dizziness   . Gout     hx of--78yrs ago  . Bruises easily     pt is on Plavix and ASA  . Shortness of breath     wakes pt up during night with occ shortness of breathe;states it happens about once a month  . Impaired hearing   . Urinary frequency   . UTI (lower urinary tract infection)     hx of--03/2010  . Anemia     takes Glyburide bid  . CAD (coronary artery disease) of artery bypass graft   . Pneumonia     "at least twice that I know of"  . Seizures (HCC) 05/14/11    "first time was today"  . CVA (cerebral vascular accident) [facility]) ~ 2009    right arm/leg occ hurts  . Carotid artery occlusion 2013    s/p bilateral endarterectomy  . Chronic headache   . Chronic neck pain   . Dementia with behavioral disturbance   . Dyspnea     chronic, daily    Past Surgical History  Procedure Laterality Date  . Appendectomy  1968  .  Colonoscopy    . Endarterectomy  04/09/2011    Procedure: ENDARTERECTOMY CAROTID;  Surgeon: Chuck Hinthristopher S Dickson, MD;  [facility];  Service: Vascular;  Laterality: Left;  Left Carotid endarterectomy With Dacron Patch Angioplasty  . Cardiac catheterization  1998    Dr. Antoine PocheHochrein  . Endarterectomy  05/08/2011    Procedure: ENDARTERECTOMY CAROTID;  Surgeon: Chuck Hinthristopher S Dickson, MD;  [facility];  Service: Vascular;  Laterality: Right;  with [place] Patch Angioplasty  .  Cataract extraction w/ intraocular lens  implant, bilateral  2012  . Coronary artery bypass graft  1998    LIMA to LAD, SVG to diagonal, SVG to PDA, SVG to posterior lateral.   . Eye surgery  1953    right ; "piece of metal removed"  . Eye surgery  2012    Cataract- Bilateral    Medications:  HOME MEDS: Prior to Admission medications   Medication Sig Start Date End Date Taking? Authorizing Provider  albuterol (PROVENTIL) (2.5 MG/3ML) 0.083% nebulizer solution Take 3 mLs (2.5 mg total) by nebulization every 6 (six) hours as needed for wheezing or shortness of breath. 02/12/15  Yes Frederica KusterStephen M Miller, MD  allopurinol (ZYLOPRIM) 300 MG tablet TAKE 1 TABLET DAILY 02/22/15  Yes Frederica KusterStephen M Miller, MD  amoxicillin (AMOXIL) 500 MG capsule Take 1 capsule (500 mg total) by mouth 3 (three) times daily. 02/19/15  Yes Ernestina Pennaonald W Moore, MD  aspirin EC 81 MG tablet Take 81 mg by mouth 2 (two) times daily.   Yes Historical Provider, MD  clopidogrel (PLAVIX) 75 MG tablet TAKE 1 TABLET DAILY 02/06/15  Yes Rollene RotundaJames Hochrein, MD  donepezil (ARICEPT) 10 MG tablet TAKE 1 TABLET AT BEDTIME 01/25/15  Yes Frederica KusterStephen M Miller, MD  finasteride (PROSCAR) 5 MG tablet TAKE 1 TABLET DAILY 02/18/15  Yes Frederica KusterStephen M Miller, MD  glyBURIDE (DIABETA) 2.5 MG tablet Take 1 tablet (2.5 mg total) by mouth 2 (two) times daily with a meal. 12/04/14  Yes Frederica KusterStephen M Miller, MD  HYDROcodone-homatropine [facility](HYCODAN) 5-1.5 MG/5ML syrup Take 5 mLs by mouth every 6 (six) hours as needed for cough. 02/12/15  Yes Frederica KusterStephen M Miller, MD  ibuprofen (ADVIL,MOTRIN) 200 MG tablet Take 200 mg by mouth every 6 (six) hours as needed for headache or mild pain.    Yes Historical Provider, MD  LORazepam (ATIVAN) 0.5 MG tablet Take 1 tablet (0.5 mg total) by mouth 2 (two) times daily. Patient taking differently: Take 0.5 mg by mouth 2 (two) times daily as needed for anxiety.  02/12/15  Yes Frederica KusterStephen M Miller, MD  memantine (NAMENDA) 10 MG tablet Take 10 mg by mouth 2 (two)  times daily.   Yes Historical Provider, MD  metFORMIN (GLUCOPHAGE) 1000 MG tablet Take 1 tablet (1,000 mg total) by mouth 2 (two) times daily with a meal. 01/16/15  Yes Frederica KusterStephen M Miller, MD  metoprolol (LOPRESSOR) 50 MG tablet Take 1 tablet (50 mg total) by mouth 2 (two) times daily. 12/11/14  Yes Frederica KusterStephen M Miller, MD  QUEtiapine (SEROQUEL) 50 MG tablet TAKE 2 TABLETS IN THE MORNING AND 3 TABLETS AT BEDTIME 12/18/14  Yes Frederica KusterStephen M Miller, MD  Umeclidinium Bromide (INCRUSE ELLIPTA) 62.5 MCG/INH AEPB Inhale 1 Inhaler into the lungs daily. 01/16/15  Yes Frederica KusterStephen M Miller, MD  furosemide (LASIX) 20 MG tablet TAKE 1 TABLET DAILY AS NEEDED FOR FLUID 02/26/15   Frederica KusterStephen M Miller, MD     Allergies:  Allergies  Allergen Reactions  . Fexofenadine Hcl Other (See Comments)  Heart attack symptoms  . Flonase [Fluticasone Propionate] Other (See Comments)    Heart attack symptoms  . Nasal Spray Other (See Comments)    Heart attack symptoms   . Nasal Spray     CAN NOT TOLERATE ANY NASAL SPRAYS!!!!!  Causes heart attack symptoms    Social History:   reports that he quit smoking about 41 years ago. His smoking use included Cigarettes. He has a 20 pack-year smoking history. He has never used smokeless tobacco. He reports that he does not drink alcohol or use illicit drugs.  Family History: Family History  Problem Relation Age of Onset  . Diabetes Sister   . Cancer Brother   . Diabetes Brother   . Anesthesia problems Neg Hx   . Hypotension Neg Hx   . Malignant hyperthermia Neg Hx   . Pseudochol deficiency Neg Hx   . Hearing loss Father      Physical Exam: Filed Vitals:   02/25/15 2032 02/26/15 0000 02/26/15 0400 02/26/15 1442  BP:  129/64 137/66 122/50  Pulse:  98 89 81  Temp:  99.9 F (37.7 C) 99.8 F (37.7 C) 99.1 F (37.3 C)  TempSrc:  Oral Oral Oral  Resp:  Height:      Weight:      SpO2: 96% 97% 95% 97%   Blood pressure 122/50, pulse 81, temperature 99.1 F (37.3  C), temperature source Oral, resp. rate 18, height 6' (1.829 m), weight 95.119 kg (209 lb 11.2 oz), SpO2 97 %.  GEN:  Pleasant patient lying in the stretcher in no acute distress; cooperative with exam. PSYCH:  alert and oriented x4; does not appear anxious or depressed; affect is appropriate. HEENT: Mucous membranes pink and anicteric; PERRLA; EOM intact; no cervical lymphadenopathy nor thyromegaly or carotid bruit; no JVD; There were no stridor. Neck is very supple.  Bilateral sinus tenderness.  Breasts:: Not examined CHEST WALL: No tenderness CHEST: Normal respiration, clear to auscultation bilaterally.  HEART: Regular rate and rhythm.  There are no murmur, rub, or gallops.   BACK: No kyphosis or scoliosis; no CVA tenderness ABDOMEN: soft and non-tender; no masses, no organomegaly, normal abdominal bowel sounds; no pannus; no intertriginous candida. There is no rebound and no distention. Rectal Exam: Not done EXTREMITIES: No bone or joint deformity; age-appropriate arthropathy of the hands and knees; no edema; no ulcerations.  There is no calf tenderness. Genitalia: not examined PULSES: 2+ and symmetric SKIN: Normal hydration no rash or ulceration CNS: Cranial nerves 2-12 grossly intact no focal lateralizing neurologic deficit.  Speech is fluent; uvula elevated with phonation, facial symmetry and tongue midline. DTR are normal bilaterally, cerebella exam is intact, barbinski is negative and strengths are equaled bilaterally.  No sensory loss.   Labs on Admission:  Basic Metabolic Panel:  Recent Labs Lab 02/25/15 0035 02/26/15 0643  NA 136 135  K 4.2 3.8  CL 100* 103  CO2 26 25  GLUCOSE 99 60*  BUN 29* 23*  CREATININE 1.93* 1.47*  CALCIUM 8.4* 7.5*   Liver Function Tests:  Recent Labs Lab 02/25/15 0035  AST 18  ALT 19  ALKPHOS 44  BILITOT 0.3  PROT 6.7  ALBUMIN 3.5   CBC:  Recent Labs Lab 02/25/15 0035 02/26/15 0643  WBC 7.7 5.1  NEUTROABS 5.5  --   HGB 12.1*  10.8*  HCT 36.7* 31.9*  MCV 94.3 94.4  PLT 191 165   Cardiac Enzymes:  Recent Labs Lab 02/25/15 0335  TROPONINI <0.03   Radiological Exams on Admission: Dg Chest Port 1 View  02/25/2015  CLINICAL DATA:  Acute onset of fever. Found on floor. Status post unwitnessed fall when attempting to get up to go to the bathroom. Initial encounter. EXAM: PORTABLE CHEST 1 VIEW COMPARISON:  Chest radiograph performed 02/12/2015 FINDINGS: The lungs are well-aerated and clear. There is no evidence of focal opacification, pleural effusion or pneumothorax. The cardiomediastinal silhouette is borderline normal in size. The patient is status post median sternotomy, with evidence of prior CABG. There are chronic fractures of multiple sternal wires. No acute osseous abnormalities are seen. IMPRESSION: No acute cardiopulmonary process seen. No displaced rib fractures identified. Electronically Signed   By: Roanna Raider M.D.   On: 02/25/2015 04:34   Ct Maxillofacial Wo Cm  02/25/2015  CLINICAL DATA:  Frontal and maxillary headache for 2-3 months. EXAM: CT MAXILLOFACIAL WITHOUT CONTRAST TECHNIQUE: Multidetector CT imaging of the maxillofacial structures was performed. Multiplanar CT image reconstructions were also generated. A small metallic BB was placed on the right temple in order to reliably differentiate right from left. COMPARISON:  Facial radiograph dated 12/05/2014 FINDINGS: The globes and extraocular muscles appear symmetrical. No air fluid levels in the paranasal sinuses. The frontal bones, orbital rims, maxillary antral walls, nasal bones, nasal septum, nasal spine, maxilla, pterygoid plates, zygomatic arches, temporomandibular joints, and mandibles appear intact. There is polypoid mucosal thickening of the bilateral maxillary sinuses with air-fluid levels. The mastoid air cells are well aerated. IMPRESSION: Bilateral maxillary acute to subacute sinusitis. No evidence of bony or periosteal changes to suggest  longstanding process. Electronically Signed   By: Ted Mcalpine M.D.   On: 02/25/2015 20:22    Assessment/Plan Present on Admission:  . SIRS (systemic inflammatory response syndrome) (HCC) . Urinary tract infection, site not specified . Diabetes mellitus due to underlying condition without complications (HCC) . Encephalopathy acute . Fall . Hypertension . AKI (acute kidney injury) (HCC)  PLAN:  Fever of unclear etiology:  Although I am not certain the source of his fever, it is suspicious that he has chronic sinusitis.  I told him and his family that FUO is sometime difficult to pinpoint, and that history and physical are the most reliable way to approach the diagnosis.  I recommend to treat with Augmentin for at least 21 days, if not long.  Will check a PSA to be sure he doesn't have prostatitis.  I also think upon discharge, to follow up with ENT would be helpful, in case he needs "washing".   Blood cultures and urine cultures were negative thus far.  Since he has been here, no more fever and no leukocytosis, and his lactic acid was normal.  I explained that fever could be due to non infectious cause as well.    AKI:  Improved with IVF.  DM:  He did have some hypoglycemia, and will be monitored.   Weakness:  He had some pain on the mid left thigh, but negative exam, and able to bear weight.  Will follow.  No swelling. PT has been working with him.   HTN:  Controlled.   Other plans as per orders. Code Status: FULL Unk Lightning, MD.  FACP [facility] Pager [phone] 7pm to 7am.  02/26/2015, 5:39 PM

## 2015-02-27 ENCOUNTER — Other Ambulatory Visit: Payer: Self-pay | Admitting: Family Medicine

## 2015-02-27 LAB — BASIC METABOLIC PANEL
Anion gap: 8 (ref 5–15)
BUN: 21 mg/dL — ABNORMAL HIGH (ref 6–20)
CHLORIDE: 99 mmol/L — AB (ref 101–111)
CO2: 27 mmol/L (ref 22–32)
Calcium: 7.8 mg/dL — ABNORMAL LOW (ref 8.9–10.3)
Creatinine, Ser: 1.37 mg/dL — ABNORMAL HIGH (ref 0.61–1.24)
GFR, EST AFRICAN AMERICAN: 54 mL/min — AB (ref >60)
GFR, EST NON AFRICAN AMERICAN: 47 mL/min — AB (ref >60)
Glucose, Bld: 115 mg/dL — ABNORMAL HIGH (ref 65–99)
POTASSIUM: 4 mmol/L (ref 3.5–5.1)
SODIUM: 134 mmol/L — AB (ref 135–145)

## 2015-02-27 LAB — GLUCOSE, CAPILLARY
GLUCOSE-CAPILLARY: 95 mg/dL (ref 65–99)
GLUCOSE-CAPILLARY: 101 mg/dL — AB (ref 65–99)
Glucose-Capillary: 156 mg/dL — ABNORMAL HIGH (ref 65–99)

## 2015-02-27 MED ORDER — AMOXICILLIN-POT CLAVULANATE 875-125 MG PO TABS: 1.0000 | ORAL_TABLET | Freq: Two times a day (BID) | ORAL | Status: DC

## 2015-02-27 NOTE — Care Management Important Message (Signed)
Important Message  Patient Details  Name: Markus JarvisSylvester H Jasko MRN: [id number] Date of Birth: 08-Aug-1931   Medicare Important Message Given:  Yes    Cheryl FlashBlackwell, Tavien Chestnut Crowder, RN 02/27/2015, 2:46 PM

## 2015-02-27 NOTE — Discharge Summary (Addendum)
Physician Discharge Summary  [name] ZOX:[id number] DOB: Dec 11, 1931 DOA: 02/25/2015  PCP: Elige Radon Dettinger, MD  Admit date: 02/25/2015 Discharge date: 02/27/2015  Time spent: 45 minutes  Recommendations for Outpatient Follow-up:  -We'll be discharged home today. -Advised to follow-up with primary care provider in 2 weeks. -Will be discharged on a 14 day course of Augmentin for his acute frontal and maxillary sinusitis.   Discharge Diagnoses:  Principal Problem:   SIRS (systemic inflammatory response syndrome) (HCC) Active Problems:   Diabetes mellitus due to underlying condition without complications (HCC)   Encephalopathy acute   Weakness   Urinary tract infection, site not specified   Fall   Hypertension   AKI (acute kidney injury) (HCC)   Pyrexia   Discharge Condition: Stable and improved  Filed Weights   02/25/15 0648  Weight: 95.119 kg (209 lb 11.2 oz)    History of present illness:  As per Dr. Lovell Sheehan on 12/26: Luisalberto Beegle Shontz is a 81 y.o. male with a history CVA, Seizures, HTN, Chronic Headaches, Chronic Bronchitis, and Dementia who was brought to the ED with complaints of increased weakness and fever over the past few days. He reports that he has not felt well for a few weeks. He had been found on the floor by his family this evening after he tried to go use the bathroom. He has had 2 courses of outpatient antibiotics for a UTI, first with Doxycycline followed by Amoxicllin. His family reports that he had fever to 102 at home, and he was given Tylenol. In the ED, his temperature was 101.7. A Sepsis workup was initiated and he was administered 2 grams of IV Rocephin for empiric treatment for a partially treated UTI. He was referred for admission  Hospital Course:   Acute frontal and maxillary sinusitis/SIRS -We'll continue Augmentin for 14 days. -This is likely the cause of his fever, has been afebrile for the past 48  hours. -We'll discharge home today. Advise close follow-up with ENT upon discharge.  Acute renal failure on chronic kidney disease stage III -Creatinine continues to improve, down to 1.37 on discharge from a max of 1.93 on admission. -Baseline creatinine appears to be around 1.4.  Hypertension -Fair control  Type 2 diabetes -Well-controlled  Generalized weakness -Seen by physical therapy with plans for home health PT.  Dementia -At baseline, continue medications.  Procedures:  None   Consultations: None   Discharge Instructions  Discharge Instructions    Diet - low sodium heart healthy    Complete by:  As directed      Increase activity slowly    Complete by:  As directed             Medication List    STOP taking these medications        amoxicillin 500 MG capsule  Commonly known as:  AMOXIL      TAKE these medications        albuterol (2.5 MG/3ML) 0.083% nebulizer solution  Commonly known as:  PROVENTIL  Take 3 mLs (2.5 mg total) by nebulization every 6 (six) hours as needed for wheezing or shortness of breath.     allopurinol 300 MG tablet  Commonly known as:  ZYLOPRIM  TAKE 1 TABLET DAILY     amoxicillin-clavulanate 875-125 MG tablet  Commonly known as:  AUGMENTIN  Take 1 tablet by mouth every 12 (twelve) hours.     aspirin EC 81 MG tablet  Take 81 mg by mouth 2 (  two) times daily.     clopidogrel 75 MG tablet  Commonly known as:  PLAVIX  TAKE 1 TABLET DAILY     donepezil 10 MG tablet  Commonly known as:  ARICEPT  TAKE 1 TABLET AT BEDTIME     finasteride 5 MG tablet  Commonly known as:  PROSCAR  TAKE 1 TABLET DAILY     furosemide 20 MG tablet  Commonly known as:  LASIX  TAKE 1 TABLET DAILY AS NEEDED FOR FLUID     glyBURIDE 2.5 MG tablet  Commonly known as:  DIABETA  Take 1 tablet (2.5 mg total) by mouth 2 (two) times daily with a meal.     HYDROcodone-homatropine 5-1.5 MG/5ML syrup  Commonly known as:  HYCODAN  Take 5 mLs by mouth  every 6 (six) hours as needed for cough.     ibuprofen 200 MG tablet  Commonly known as:  ADVIL,MOTRIN  Take 200 mg by mouth every 6 (six) hours as needed for headache or mild pain.     LORazepam 0.5 MG tablet  Commonly known as:  ATIVAN  Take 1 tablet (0.5 mg total) by mouth 2 (two) times daily.     memantine 10 MG tablet  Commonly known as:  NAMENDA  Take 10 mg by mouth 2 (two) times daily.     metFORMIN 1000 MG tablet  Commonly known as:  GLUCOPHAGE  Take 1 tablet (1,000 mg total) by mouth 2 (two) times daily with a meal.     metoprolol 50 MG tablet  Commonly known as:  LOPRESSOR  Take 1 tablet (50 mg total) by mouth 2 (two) times daily.     QUEtiapine 50 MG tablet  Commonly known as:  SEROQUEL  TAKE 2 TABLETS IN THE MORNING AND 3 TABLETS AT BEDTIME     Umeclidinium Bromide 62.5 MCG/INH Aepb  Commonly known as:  INCRUSE ELLIPTA  Inhale 1 Inhaler into the lungs daily.       Allergies  Allergen Reactions  . Fexofenadine Hcl Other (See Comments)    Heart attack symptoms  . Flonase [Fluticasone Propionate] Other (See Comments)    Heart attack symptoms  . Nasal Spray Other (See Comments)    Heart attack symptoms   . Nasal Spray     CAN NOT TOLERATE ANY NASAL SPRAYS!!!!!  Causes heart attack symptoms       Follow-up Information    Follow up with [facility].   Contact information:   [address] [place] [address] [phone]       Follow up with Elige Radon Dettinger, MD. Schedule an appointment as soon as possible for a visit in 2 weeks.   Specialties:  Family Medicine, Cardiology   Contact information:   [address] [place] [address] [phone]        The results of significant diagnostics from this hospitalization (including imaging, microbiology, ancillary and laboratory) are listed below for reference.    Significant Diagnostic Studies: Dg Chest 2 View  02/12/2015  CLINICAL DATA:  Cough, history of coronary  artery disease, COPD EXAM: CHEST  2 VIEW COMPARISON:  PA and lateral chest x-ray of January 26, 2015 FINDINGS: The lungs are adequately inflated. There is no alveolar infiltrate. There are coarse stable lung markings in the retrocardiac region. The heart is normal in size. There are post CABG and internal mammary artery dissection changes. There is no pleural effusion. The bony thorax exhibits no acute abnormality. The 2 lower most  sternal wires are broken. This is a chronic finding. IMPRESSION: No active cardiopulmonary disease. Electronically Signed   By: David  SwazilandJordan M.D.   On: 02/12/2015 16:36   Dg Chest Port 1 View  02/25/2015  CLINICAL DATA:  Acute onset of fever. Found on floor. Status post unwitnessed fall when attempting to get up to go to the bathroom. Initial encounter. EXAM: PORTABLE CHEST 1 VIEW COMPARISON:  Chest radiograph performed 02/12/2015 FINDINGS: The lungs are well-aerated and clear. There is no evidence of focal opacification, pleural effusion or pneumothorax. The cardiomediastinal silhouette is borderline normal in size. The patient is status post median sternotomy, with evidence of prior CABG. There are chronic fractures of multiple sternal wires. No acute osseous abnormalities are seen. IMPRESSION: No acute cardiopulmonary process seen. No displaced rib fractures identified. Electronically Signed   By: Roanna RaiderJeffery  Chang M.D.   On: 02/25/2015 04:34   Ct Maxillofacial Wo Cm  02/25/2015  CLINICAL DATA:  Frontal and maxillary headache for 2-3 months. EXAM: CT MAXILLOFACIAL WITHOUT CONTRAST TECHNIQUE: Multidetector CT imaging of the maxillofacial structures was performed. Multiplanar CT image reconstructions were also generated. A small metallic BB was placed on the right temple in order to reliably differentiate right from left. COMPARISON:  Facial radiograph dated 12/05/2014 FINDINGS: The globes and extraocular muscles appear symmetrical. No air fluid levels in the paranasal sinuses.  The frontal bones, orbital rims, maxillary antral walls, nasal bones, nasal septum, nasal spine, maxilla, pterygoid plates, zygomatic arches, temporomandibular joints, and mandibles appear intact. There is polypoid mucosal thickening of the bilateral maxillary sinuses with air-fluid levels. The mastoid air cells are well aerated. IMPRESSION: Bilateral maxillary acute to subacute sinusitis. No evidence of bony or periosteal changes to suggest longstanding process. Electronically Signed   By: Ted Mcalpineobrinka  Dimitrova M.D.   On: 02/25/2015 20:22    Microbiology: Recent Results (from the past 240 hour(s))  Blood Culture (routine x 2)     Status: None (Preliminary result)   Collection Time: 02/25/15  3:35 AM  Result Value Ref Range Status   Specimen Description RIGHT ANTECUBITAL  Final   Special Requests BOTTLES DRAWN AEROBIC AND ANAEROBIC 6CC  Final   Culture NO GROWTH 2 DAYS  Final   Report Status PENDING  Incomplete  Blood Culture (routine x 2)     Status: None (Preliminary result)   Collection Time: 02/25/15  4:17 AM  Result Value Ref Range Status   Specimen Description RIGHT ANTECUBITAL  Final   Special Requests BOTTLES DRAWN AEROBIC AND ANAEROBIC 6CC  Final   Culture NO GROWTH 2 DAYS  Final   Report Status PENDING  Incomplete  Urine culture     Status: None   Collection Time: 02/25/15  4:43 AM  Result Value Ref Range Status   Specimen Description URINE, CATHETERIZED  Final   Special Requests NONE  Final   Culture   Final    NO GROWTH 1 DAY [facility]formed [facility]    Report Status 02/26/2015 FINAL  Final     Labs: Basic Metabolic Panel:  Recent Labs Lab 02/25/15 0035 02/26/15 0643 02/27/15 0631  NA 136 135 134*  K 4.2 3.8 4.0  CL 100* 103 99*  CO2 26 25 27   GLUCOSE 99 60* 115*  BUN 29* 23* 21*  CREATININE 1.93* 1.47* 1.37*  CALCIUM 8.4* 7.5* 7.8*   Liver Function Tests:  Recent Labs Lab 02/25/15 0035  AST 18  ALT 19  ALKPHOS 44  BILITOT 0.3  PROT 6.7  ALBUMIN 3.5   No results for input(s): LIPASE, AMYLASE in the last 168 hours. No results for input(s): AMMONIA in the last 168 hours. CBC:  Recent Labs Lab 02/25/15 0035 02/26/15 0643  WBC 7.7 5.1  NEUTROABS 5.5  --   HGB 12.1* 10.8*  HCT 36.7* 31.9*  MCV 94.3 94.4  PLT 191 165   Cardiac Enzymes:  Recent Labs Lab 02/25/15 0335  TROPONINI <0.03   BNP: BNP (last 3 results)  Recent Labs  04/10/14 2108 05/28/14 2329  BNP 21.0 32.0    ProBNP (last 3 results) No results for input(s): PROBNP in the last 8760 hours.  CBG:  Recent Labs Lab 02/26/15 2139 02/27/15 0453 02/27/15 0815 02/27/15 1209  GLUCAP 66 95 101* 156*       Signed:  HERNANDEZ ACOSTA,ESTELA  [facility] Pager: [phone] 02/27/2015, 2:36 PM

## 2015-02-27 NOTE — Progress Notes (Signed)
Patient with orders to be discharge home. Discharge instructions given, patient and family verbalized understanding. Patient stable. Patient left in private vehicle with family.  

## 2015-02-27 NOTE — Care Management Note (Signed)
Case Management Note  Patient Details  Name: Markus JarvisSylvester H Eisinger MRN: [id number] Date of Birth: 10-31-1931  Subjective/Objective:                   Action/Plan:   Expected Discharge Date:                  Expected Discharge Plan:  Home w Home Health Services  In-House Referral:  NA  Discharge planning Services  CM Consult  Post Acute Care Choice:  Home Health, Durable Medical Equipment Choice offered to:  [facility]  DME Arranged:  Wheelchair manual, Bedside commode DME Agency:  [facility]  HH Arr[facility]  [facility]  [facility]  Status of Service:  Completed, signed off  Medicare Important Message Given:    Date Medicare IM Given:    Medicare IM give by:    Date Additional Medicare IM Given:    Additional Medicare Important Message give by:     If discussed at Long Length of Stay Meetings, dates discussed:    Additional Comments: [facility] (per pts son/DIL choice). Alroy BailiffLinda Lothian of [facility] is aware and will collect the pts information from the chart. HH services to start within 48 hours of discharge. Pt also required w/c and BSC per PT recommendations and DME was delivered to pts room [facility]). Pts DIL requested a new neb machine. Pt had just received new neb machine within the last year and per [facility] Medicare will not pay for new neb machine except every 5 years. Pts family stated the machine works but is just "beat up'. Offered to obtain new machine for private pay, family stated they would take machine to DME company where machine was purchased to be checked out. Pt and pts nurse aware of discharge arrangements. Arlyss QueenBlackwell, Karrie Fluellen Beauregardrowder, RN 02/27/2015, 2:38 PM

## 2015-02-28 ENCOUNTER — Telehealth: Payer: Self-pay | Admitting: Family Medicine

## 2015-02-28 ENCOUNTER — Other Ambulatory Visit: Payer: Self-pay | Admitting: Nurse Practitioner

## 2015-02-28 MED ORDER — HYDROCODONE-HOMATROPINE 5-1.5 MG/5ML PO SYRP: 5.0000 mL | ORAL_SOLUTION | Freq: Four times a day (QID) | ORAL | Status: DC | PRN

## 2015-02-28 NOTE — Telephone Encounter (Signed)
Just released from hosp. Looks like he is on an antibiotic?

## 2015-02-28 NOTE — Telephone Encounter (Signed)
Left message for pt letting him know we are still working on his rx request and we will call him as soon as the provider reviews it

## 2015-02-28 NOTE — Telephone Encounter (Signed)
Family aware to come pick up rx for hycodan

## 2015-03-01 ENCOUNTER — Telehealth: Payer: Self-pay | Admitting: *Deleted

## 2015-03-01 NOTE — Telephone Encounter (Addendum)
  Call Completed and Appointment Scheduled: Yes, Date: 03/07/15 Dr Quita SkyeMiller   DISCHARGE INFORMATION Date of Discharge:02/27/15  [facility]   Principal Discharge Diagnosis: Fever  Patient and/or caregiver is knowledgeable of his/her condition(s) and treatment: No: Fever of unknown origin   MEDICATION RECONCILIATION Medication list reviewed with patient: Yes  Patient is able to obtain needed medications: Yes   ACTIVITIES OF DAILY LIVING  Is the patient able to perform his/her own ADLs: No: Family and Home Health services are aiding in this  Patient is receiving home health services: yes Physical therapy and home health nurse   PATIENT EDUCATION Questions/Concerns Discussed: Family is concerned that fever has not resolved and they are uncertain of cause. Hospital staff was not able to provide them with an answer. He was discharged with Augmentin. Urine and blood cultures were negative. Daughter-in-law reports that a sputum culture was collected but I do not see that one was resulted or even ordered. CXR and Chest CT were clear. Patient does have a cough and has a prescription for cough medication here to be picked up. According to daughter-in-law he has declined dramatically both mentally and physically over the past week. PT is coming to home this afternoon for assessment. [facility]. Since fever is persisting despite antibiotic use and he continues to decline I believe this is a good idea. An appt with his PCP is scheduled for next week. I don't feel that bringing him in sooner would be of any benefit. We can't offer any further workup that was done while he was inpatient. Daughter-in-law stated understanding and agreement to plan.

## 2015-03-02 LAB — CULTURE, BLOOD (ROUTINE X 2)
CULTURE: NO GROWTH
Culture: NO GROWTH

## 2015-03-05 ENCOUNTER — Encounter: Payer: Self-pay | Admitting: *Deleted

## 2015-03-07 ENCOUNTER — Other Ambulatory Visit: Payer: Self-pay | Admitting: Family Medicine

## 2015-03-07 ENCOUNTER — Ambulatory Visit (INDEPENDENT_AMBULATORY_CARE_PROVIDER_SITE_OTHER): Payer: Medicare Other | Admitting: Family Medicine

## 2015-03-07 ENCOUNTER — Encounter: Payer: Self-pay | Admitting: Family Medicine

## 2015-03-07 VITALS — BP 144/82 | HR 105 | Temp 98.4°F | Ht 72.0 in | Wt 204.6 lb

## 2015-03-07 DIAGNOSIS — I10 Essential (primary) hypertension: Secondary | ICD-10-CM | POA: Diagnosis not present

## 2015-03-07 DIAGNOSIS — N390 Urinary tract infection, site not specified: Secondary | ICD-10-CM

## 2015-03-07 DIAGNOSIS — R509 Fever, unspecified: Secondary | ICD-10-CM

## 2015-03-07 LAB — POCT UA - MICROSCOPIC ONLY
BACTERIA, U MICROSCOPIC: NEGATIVE
CRYSTALS, UR, HPF, POC: NEGATIVE
Casts, Ur, LPF, POC: NEGATIVE
MUCUS UA: NEGATIVE
WBC, UR, HPF, POC: NEGATIVE
Yeast, UA: NEGATIVE

## 2015-03-07 MED ORDER — DONEPEZIL HCL 10 MG PO TABS: 10.0000 mg | ORAL_TABLET | Freq: Every day | ORAL | Status: DC

## 2015-03-07 NOTE — Progress Notes (Signed)
Subjective:    Patient ID: [name] JarvisSylvester H [name], male    DOB: 1932-02-02, 82 y.o.   MRN: [id number]  HPI 82 year old gentleman with dementia who was recently hospitalized with sinusitis acute kidney failure SIRS. Hospitalization was from 1226 to 02/27/2015. He was discharged on Augmentin for sinus infection. He continues to have headaches, increased confusion, and falls. There is some early irritation on elbows and sacrum secondary to bed rest and pressure over these areas. Daughter-in-law is using barrier cream appropriately. Home care has been out and hospital bed bedside commode wheelchair have been ordered and he will be tested to see if oxygen use is appropriate.  Patient Active Problem List   Diagnosis Date Noted  . Weakness 02/25/2015  . SIRS (systemic inflammatory response syndrome) (HCC) 02/25/2015  . Urinary tract infection, site not specified 02/25/2015  . Fall 02/25/2015  . Hypertension 02/25/2015  . AKI (acute kidney injury) (HCC) 02/25/2015  . Pyrexia   . Acute urinary retention 05/30/2014  . Hematuria 05/29/2014  . Elevated lactic acid level 05/26/2014  . Hypokalemia 05/26/2014  . Acute gout   . Encephalopathy acute 05/06/2014  . UTI (lower urinary tract infection) 05/06/2014  . Acute renal insufficiency 05/06/2014  . Dehydration 05/06/2014  . FTT (failure to thrive) in adult 05/06/2014  . Renal insufficiency   . Diabetes mellitus due to underlying condition without complications (HCC)   . COPD exacerbation (HCC) 04/11/2014  . Chronic cough 04/10/2014  . Carotid artery occlusion 04/10/2014  . CVA (cerebral vascular accident) (HCC) 04/10/2014  . Anemia 04/10/2014  . Dementia with behavioral disturbance 11/15/2013  . Benign paroxysmal positional vertigo 01/30/2013  . Adjustment reaction of adult life 01/30/2013  . Need for prophylactic vaccination and inoculation against influenza 12/09/2012  . Seizure (HCC) 05/14/2011  . CAD (coronary artery disease) of artery  bypass graft   . Hyperlipidemia   . Peripheral vascular disease (HCC)   . Occlusion and stenosis of carotid artery without mention of cerebral infarction 02/11/2011   Outpatient Encounter Prescriptions as of 03/07/2015  Medication Sig  . albuterol (PROVENTIL) (2.5 MG/3ML) 0.083% nebulizer solution Take 3 mLs (2.5 mg total) by nebulization every 6 (six) hours as needed for wheezing or shortness of breath.  . allopurinol (ZYLOPRIM) 300 MG tablet TAKE 1 TABLET DAILY  . amoxicillin-clavulanate (AUGMENTIN) 875-125 MG tablet Take 1 tablet by mouth every 12 (twelve) hours.  Marland Kitchen. aspirin EC 81 MG tablet Take 81 mg by mouth 2 (two) times daily.  . clopidogrel (PLAVIX) 75 MG tablet TAKE 1 TABLET DAILY  . donepezil (ARICEPT) 10 MG tablet TAKE 1 TABLET AT BEDTIME  . finasteride (PROSCAR) 5 MG tablet TAKE 1 TABLET DAILY  . furosemide (LASIX) 20 MG tablet TAKE 1 TABLET DAILY AS NEEDED FOR FLUID  . glyBURIDE (DIABETA) 2.5 MG tablet Take 1 tablet (2.5 mg total) by mouth 2 (two) times daily with a meal.  . HYDROcodone-homatropine (HYCODAN) 5-1.5 MG/5ML syrup Take 5 mLs by mouth every 6 (six) hours as needed for cough.  Marland Kitchen. ibuprofen (ADVIL,MOTRIN) 200 MG tablet Take 200 mg by mouth every 6 (six) hours as needed for headache or mild pain.   Marland Kitchen. LORazepam (ATIVAN) 0.5 MG tablet Take 1 tablet (0.5 mg total) by mouth 2 (two) times daily. (Patient taking differently: Take 0.5 mg by mouth 2 (two) times daily as needed for anxiety. )  . memantine (NAMENDA) 10 MG tablet Take 10 mg by mouth 2 (two) times daily.  . metFORMIN (GLUCOPHAGE) 1000 MG tablet Take  1 tablet (1,000 mg total) by mouth 2 (two) times daily with a meal.  . metoprolol (LOPRESSOR) 50 MG tablet Take 1 tablet (50 mg total) by mouth 2 (two) times daily.  . QUEtiapine (SEROQUEL) 50 MG tablet TAKE 2 TABLETS IN THE MORNING AND 3 TABLETS AT BEDTIME  . Umeclidinium Bromide (INCRUSE ELLIPTA) 62.5 MCG/INH AEPB Inhale 1 Inhaler into the lungs daily.   No  facility-administered encounter medications on file as of 03/07/2015.      Review of Systems  Constitutional: Positive for activity change and unexpected weight change.  Respiratory: Negative.   Cardiovascular: Negative.   Neurological: Positive for weakness and headaches.       Objective:   Physical Exam  Constitutional: He appears well-developed and well-nourished.  Patient appears weaker, less per night with less eye contact. There has been decline since his last visit here several weeks ago. He also has anorexia and has had a 5-6 pound weight loss in the past 2 weeks  HENT:  Mouth/Throat: Oropharynx is clear and moist.  Cardiovascular: Normal rate and regular rhythm.   Pulmonary/Chest: Effort normal and breath sounds normal.  Abdominal: Soft.  Skin:  Or areas of erythema over the buttocks as well as elbows and probably could be called stage I breakdown. Barrier cream has been recommended and is being used.          Assessment & Plan:  1. Essential hypertension Blood pressures are okay on current regimen  2. UTI (lower urinary tract infection) Head urinary tracts infections in the past like to check today to see if this has cleared - CBC with Differential/Platelet - POCT urinalysis dipstick  3. Fever, unspecified fever cause Assume this is related to ongoing sinus infection. There is no evidence of respiratory infection daughter-in-law is holding Seroquel and I have told her she can use that or the Ativan as needed - CBC with Differential/Platelet - POCT urinalysis dipstick

## 2015-03-07 NOTE — Addendum Note (Signed)
Addended by: Gwenith DailyHUDY, Falicia Lizotte N on: 03/07/2015 01:48 PM   Modules accepted: Level of Service

## 2015-03-07 NOTE — Addendum Note (Signed)
Addended by: Tommas OlpHANDY, Tarnesha Ulloa N on: 03/07/2015 11:14 AM   Modules accepted: Orders

## 2015-03-07 NOTE — Progress Notes (Signed)
Today's visit is for Transitional Care Management.  The [facility] was [facility] on [facility]/15 with a primary diagnosis of fever.   Contact with the patient and/or caregiver, by a clinical staff member, was made on 03/01/15 and was documented as a telephone encounter within the EMR.  Through chart review and discussion with the patient I have determined that management of their condition is of moderate complexity.

## 2015-03-07 NOTE — Addendum Note (Signed)
Addended by: HOLT, CATHY on: 03/07/2015 11:00 AM   Modules accepted: Orders  

## 2015-03-07 NOTE — Progress Notes (Deleted)
Today's visit is for Transitional Care Management.  The patient was discharged from [facility] on 02/27/15 with a primary diagnosis of fever.   Contact with the patient and/or caregiver, by a clinical staff member, was made on 03/01/15 and was documented as a telephone encounter within the EMR.  Through chart review and discussion with the patient I have determined that management of their condition is of moderate complexity.   

## 2015-03-08 ENCOUNTER — Telehealth: Payer: Self-pay | Admitting: Family Medicine

## 2015-03-08 LAB — CBC WITH DIFFERENTIAL/PLATELET
BASOS: 1 %
Basophils Absolute: 0.1 10*3/uL (ref 0.0–0.2)
EOS (ABSOLUTE): 0.1 10*3/uL (ref 0.0–0.4)
EOS: 1 %
HEMATOCRIT: 37 % — AB (ref 37.5–51.0)
Hemoglobin: 12.4 g/dL — ABNORMAL LOW (ref 12.6–17.7)
IMMATURE GRANULOCYTES: 1 %
Immature Grans (Abs): 0.1 10*3/uL (ref 0.0–0.1)
LYMPHS ABS: 1.2 10*3/uL (ref 0.7–3.1)
Lymphs: 12 %
MCH: 30.5 pg (ref 26.6–33.0)
MCHC: 33.5 g/dL (ref 31.5–35.7)
MCV: 91 fL (ref 79–97)
MONOS ABS: 0.7 10*3/uL (ref 0.1–0.9)
Monocytes: 6 %
NEUTROS ABS: 8.6 10*3/uL — AB (ref 1.4–7.0)
NEUTROS PCT: 79 %
Platelets: 264 10*3/uL (ref 150–379)
RBC: 4.07 x10E6/uL — ABNORMAL LOW (ref 4.14–5.80)
RDW: 16.3 % — AB (ref 12.3–15.4)
WBC: 10.7 10*3/uL (ref 3.4–10.8)

## 2015-03-08 LAB — URINE CULTURE: Organism ID, Bacteria: NO GROWTH

## 2015-03-08 NOTE — Telephone Encounter (Signed)
Patients family aware of results

## 2015-03-12 ENCOUNTER — Ambulatory Visit: Payer: Medicare Other | Admitting: Family Medicine

## 2015-03-25 ENCOUNTER — Telehealth: Payer: Self-pay | Admitting: Family Medicine

## 2015-03-25 NOTE — Telephone Encounter (Signed)
Stp and she was seen at the urgent care Saturday and she was told she had type A flu and given tamiflu and given cough meds but now [name] has it and wants something called in for him. Please advise.

## 2015-03-25 NOTE — Telephone Encounter (Signed)
Dr. Hyacinth Meeker should probably address this

## 2015-03-26 NOTE — Telephone Encounter (Signed)
If he tested positive for flu Tamiflu is the only appropriate antibiotic to take.

## 2015-03-27 NOTE — Telephone Encounter (Signed)
[name] went to urgent care and was given tamiflu and cough medicine.

## 2015-04-10 ENCOUNTER — Other Ambulatory Visit: Payer: Self-pay | Admitting: Family Medicine

## 2015-04-10 MED ORDER — LORAZEPAM 0.5 MG PO TABS: 0.5000 mg | ORAL_TABLET | Freq: Two times a day (BID) | ORAL | Status: AC | PRN

## 2015-04-10 NOTE — Telephone Encounter (Signed)
Written Rx given to daughter-in-law while granddaughter was being seen by Dr. Louanne Skye.

## 2015-04-10 NOTE — Telephone Encounter (Signed)
They will pickup this afternoon to mail. Last filled 02/12/15, last seen 03/07/15

## 2015-04-22 ENCOUNTER — Ambulatory Visit: Payer: Medicare Other | Admitting: Family Medicine

## 2015-04-23 ENCOUNTER — Ambulatory Visit (INDEPENDENT_AMBULATORY_CARE_PROVIDER_SITE_OTHER): Payer: Medicare Other | Admitting: Family Medicine

## 2015-04-23 ENCOUNTER — Encounter: Payer: Self-pay | Admitting: Family Medicine

## 2015-04-23 ENCOUNTER — Other Ambulatory Visit: Payer: Self-pay | Admitting: Family Medicine

## 2015-04-23 VITALS — BP 130/57 | HR 44 | Temp 97.0°F | Ht 72.0 in | Wt 193.0 lb

## 2015-04-23 DIAGNOSIS — E785 Hyperlipidemia, unspecified: Secondary | ICD-10-CM

## 2015-04-23 DIAGNOSIS — E089 Diabetes mellitus due to underlying condition without complications: Secondary | ICD-10-CM

## 2015-04-23 DIAGNOSIS — I10 Essential (primary) hypertension: Secondary | ICD-10-CM | POA: Diagnosis not present

## 2015-04-23 LAB — POCT GLYCOSYLATED HEMOGLOBIN (HGB A1C): HEMOGLOBIN A1C: 6.5

## 2015-04-23 NOTE — Progress Notes (Signed)
Subjective:    Patient ID: [name], male    DOB: September 16, 1931, 82 y.o.   MRN: [id number]  HPI Pt here for follow up and management of chronic medical problems which includes diabetes, hypertension and hyperlipidemia. He is taking medications regularly. Main problem is dementia with some behavioral disturbances. He really is uncooperative at home with family but behaviors are better in public. Per his daughter-in-law he is having some problems with the toileting and wandering around the house at night when every one else is trying to sleep. He thinks his appetite is okay but he has lost 12 pounds in the last month. He is requiring frequent usage of lorazepam for agitation. He also takes Seroquel as well as Aricept and Namenda.      Patient Active Problem List   Diagnosis Date Noted  . Weakness 02/25/2015  . SIRS (systemic inflammatory response syndrome) (HCC) 02/25/2015  . Urinary tract infection, site not specified 02/25/2015  . Fall 02/25/2015  . Hypertension 02/25/2015  . AKI (acute kidney injury) (HCC) 02/25/2015  . Pyrexia   . Acute urinary retention 05/30/2014  . Hematuria 05/29/2014  . Elevated lactic acid level 05/26/2014  . Hypokalemia 05/26/2014  . Acute gout   . Encephalopathy acute 05/06/2014  . UTI (lower urinary tract infection) 05/06/2014  . Acute renal insufficiency 05/06/2014  . Dehydration 05/06/2014  . FTT (failure to thrive) in adult 05/06/2014  . Renal insufficiency   . Diabetes mellitus due to underlying condition without complications (HCC)   . COPD exacerbation (HCC) 04/11/2014  . Chronic cough 04/10/2014  . Carotid artery occlusion 04/10/2014  . CVA (cerebral vascular accident) (HCC) 04/10/2014  . Anemia 04/10/2014  . Dementia with behavioral disturbance 11/15/2013  . Benign paroxysmal positional vertigo 01/30/2013  . Adjustment reaction of adult life 01/30/2013  . Need for prophylactic vaccination and inoculation against influenza  12/09/2012  . Seizure (HCC) 05/14/2011  . CAD (coronary artery disease) of artery bypass graft   . Hyperlipidemia   . Peripheral vascular disease (HCC)   . Occlusion and stenosis of carotid artery without mention of cerebral infarction 02/11/2011   Outpatient Encounter Prescriptions as of 04/23/2015  Medication Sig  . albuterol (PROVENTIL) (2.5 MG/3ML) 0.083% nebulizer solution Take 3 mLs (2.5 mg total) by nebulization every 6 (six) hours as needed for wheezing or shortness of breath.  . allopurinol (ZYLOPRIM) 300 MG tablet TAKE 1 TABLET DAILY  . aspirin EC 81 MG tablet Take 81 mg by mouth 2 (two) times daily.  . clopidogrel (PLAVIX) 75 MG tablet TAKE 1 TABLET DAILY  . donepezil (ARICEPT) 10 MG tablet Take 1 tablet (10 mg total) by mouth at bedtime.  . finasteride (PROSCAR) 5 MG tablet TAKE 1 TABLET DAILY  . furosemide (LASIX) 20 MG tablet TAKE 1 TABLET DAILY AS NEEDED FOR FLUID  . glyBURIDE (DIABETA) 2.5 MG tablet Take 1 tablet (2.5 mg total) by mouth 2 (two) times daily with a meal.  . ibuprofen (ADVIL,MOTRIN) 200 MG tablet Take 200 mg by mouth every 6 (six) hours as needed for headache or mild pain.   Marland Kitchen LORazepam (ATIVAN) 0.5 MG tablet Take 1 tablet (0.5 mg total) by mouth 2 (two) times daily as needed for anxiety.  . memantine (NAMENDA) 10 MG tablet Take 10 mg by mouth 2 (two) times daily.  . metFORMIN (GLUCOPHAGE) 1000 MG tablet Take 1 tablet (1,000 mg total) by mouth 2 (two) times daily with a meal.  . metoprolol (LOPRESSOR) 50 MG tablet Take  1 tablet (50 mg total) by mouth 2 (two) times daily.  . QUEtiapine (SEROQUEL) 50 MG tablet TAKE 2 TABLETS IN THE MORNING AND 3 TABLETS AT BEDTIME  . Umeclidinium Bromide (INCRUSE ELLIPTA) 62.5 MCG/INH AEPB Inhale 1 Inhaler into the lungs daily.  . [DISCONTINUED] amoxicillin-clavulanate (AUGMENTIN) 875-125 MG tablet Take 1 tablet by mouth every 12 (twelve) hours.  . [DISCONTINUED] HYDROcodone-homatropine (HYCODAN) 5-1.5 MG/5ML syrup Take 5 mLs by  mouth every 6 (six) hours as needed for cough.   No facility-administered encounter medications on file as of 04/23/2015.      Review of Systems  Constitutional: Negative.   HENT: Positive for postnasal drip.   Eyes: Negative.   Respiratory: Negative.   Cardiovascular: Negative.   Gastrointestinal: Negative.   Endocrine: Negative.   Genitourinary: Negative.   Musculoskeletal: Negative.   Skin: Negative.   Allergic/Immunologic: Negative.   Neurological: Positive for headaches.  Hematological: Negative.   Psychiatric/Behavioral: Positive for agitation. The patient is nervous/anxious (worse at night).        Objective:   Physical Exam  Constitutional: He is oriented to person, place, and time. He appears well-developed and well-nourished.  He appears pale today including conjunctiva and nailbeds  HENT:  Head: Normocephalic.  Eyes: Pupils are equal, round, and reactive to light.  Cardiovascular: Normal rate, regular rhythm and normal heart sounds.   Pulmonary/Chest: Effort normal and breath sounds normal.  Neurological: He is alert and oriented to person, place, and time.  Psychiatric:  One could easily be confused during the course of an office visit. His answers are generally appropriate    BP 130/57 mmHg  Pulse 44  Temp(Src) 97 F (36.1 C) (Oral)  Ht 6' (1.829 m)  Wt 193 lb (87.544 kg)  BMI 26.17 kg/m2       Assessment & Plan:  1. Essential hypertension Blood pressures are okay. Continue same regimen - CBC with Differential/Platelet  2. Diabetes mellitus due to underlying condition without complication, without long-term current use of insulin (HCC) He eats pretty much what he wants but given his weight loss and dementia I think if we can keep his A1c from 7-8 , this is satisfactory - POCT glycosylated hemoglobin (Hb A1C) - CBC with Differential/Platelet  3. Hyperlipidemia Last LDL was at goal. He is not on statin.  Frederica Kuster MD - CBC with  Differential/Platelet

## 2015-04-23 NOTE — Patient Instructions (Signed)
Medicare Annual Wellness Visit  [place] and the medical providers at [facility] strive to bring you the best medical care.  In doing so we not only want to address your current medical conditions and concerns but also to detect new conditions early and prevent illness, disease and health-related problems.    Medicare offers a yearly Wellness Visit which allows our clinical staff to assess your need for preventative services including immunizations, lifestyle education, counseling to decrease risk of preventable diseases and screening for fall risk and other medical concerns.    This visit is provided free of charge (no copay) for all Medicare recipients. The clinical pharmacists at [facility] have begun to conduct these Wellness Visits which will also include a thorough review of all your medications.    As you primary medical provider recommend that you make an appointment for your Annual Wellness Visit if you have not done so already this year.  You may set up this appointment before you leave today or you may call back ([phone]) and schedule an appointment.  Please make sure when you call that you mention that you are scheduling your Annual Wellness Visit with the clinical pharmacist so that the appointment may be made for the proper length of time.     Continue current medications. Continue good therapeutic lifestyle changes which include good diet and exercise. Fall precautions discussed with patient. If an FOBT was given today- please return it to our front desk. If you are over 50 years old - you may need Prevnar 13 or the adult Pneumonia vaccine.  **Flu shots are available--- please call and schedule a FLU-CLINIC appointment**  After your visit with us today you will receive a survey in the mail or online from [employer] regarding your care with us. Please take a moment to fill this out. Your feedback is very  important to us as you can help us better understand your patient needs as well as improve your experience and satisfaction. WE CARE ABOUT YOU!!!    

## 2015-04-30 ENCOUNTER — Encounter: Payer: Self-pay | Admitting: Vascular Surgery

## 2015-05-08 ENCOUNTER — Ambulatory Visit (INDEPENDENT_AMBULATORY_CARE_PROVIDER_SITE_OTHER): Payer: Medicare Other | Admitting: Vascular Surgery

## 2015-05-08 ENCOUNTER — Ambulatory Visit (INDEPENDENT_AMBULATORY_CARE_PROVIDER_SITE_OTHER): Payer: Medicare Other | Admitting: Pediatrics

## 2015-05-08 ENCOUNTER — Ambulatory Visit (HOSPITAL_COMMUNITY)
Admission: RE | Admit: 2015-05-08 | Discharge: 2015-05-08 | Disposition: A | Discharge status: Home / Self Care | Payer: Medicare Other | Source: Ambulatory Visit | Attending: Vascular Surgery | Admitting: Vascular Surgery

## 2015-05-08 ENCOUNTER — Encounter: Payer: Self-pay | Admitting: Pediatrics

## 2015-05-08 ENCOUNTER — Encounter: Payer: Self-pay | Admitting: Vascular Surgery

## 2015-05-08 VITALS — BP 137/76 | HR 124 | Temp 97.6°F | Resp 16 | Ht 71.0 in | Wt 186.0 lb

## 2015-05-08 VITALS — BP 136/76 | HR 123 | Temp 96.8°F | Ht 71.0 in | Wt 189.2 lb

## 2015-05-08 DIAGNOSIS — I10 Essential (primary) hypertension: Secondary | ICD-10-CM | POA: Insufficient documentation

## 2015-05-08 DIAGNOSIS — I739 Peripheral vascular disease, unspecified: Secondary | ICD-10-CM

## 2015-05-08 DIAGNOSIS — E119 Type 2 diabetes mellitus without complications: Secondary | ICD-10-CM | POA: Diagnosis not present

## 2015-05-08 DIAGNOSIS — R6889 Other general symptoms and signs: Secondary | ICD-10-CM

## 2015-05-08 DIAGNOSIS — Z48812 Encounter for surgical aftercare following surgery on the circulatory system: Secondary | ICD-10-CM | POA: Diagnosis not present

## 2015-05-08 DIAGNOSIS — J209 Acute bronchitis, unspecified: Secondary | ICD-10-CM

## 2015-05-08 DIAGNOSIS — I779 Disorder of arteries and arterioles, unspecified: Secondary | ICD-10-CM | POA: Diagnosis not present

## 2015-05-08 DIAGNOSIS — I6523 Occlusion and stenosis of bilateral carotid arteries: Secondary | ICD-10-CM

## 2015-05-08 DIAGNOSIS — E785 Hyperlipidemia, unspecified: Secondary | ICD-10-CM | POA: Insufficient documentation

## 2015-05-08 LAB — VERITOR FLU A/B WAIVED
INFLUENZA A: NEGATIVE
Influenza B: NEGATIVE

## 2015-05-08 MED ORDER — AZITHROMYCIN 250 MG PO TABS: ORAL_TABLET | ORAL | Status: DC

## 2015-05-08 MED ORDER — AZITHROMYCIN 250 MG PO TABS: ORAL_TABLET | ORAL | Status: AC

## 2015-05-08 NOTE — Progress Notes (Signed)
Subjective:    Patient ID: [name], male    DOB: 09/01/1931, [age] y.o.   MRN: [id number]  CC: Chest Congestion; Headache; and Emesis   HPI: [name] JarvisSylvester H Tineo is a 82 y.o. male presenting for Chest Congestion; Headache; and Emesis  Chest congestion ongoing for a week No fevers Cough not getting better Threw up today after eating lunch, coughing No nausea  Having headaches every day, ibuprofen multiple times a day This was ongoing before he got current illness  Here with his granddaughter   [facility] 05/08/2015 04/23/2015 03/07/2015 11/30/2014 11/20/2014  Decreased Interest 0 0 3 2 2   Down, Depressed, Hopeless 1 1 3 2 2   PHQ - 2 Score 1 1 6 4 4   Altered sleeping - - 3 2 2   Tired, decreased energy - - 3 2 2   Change in appetite - - 3 2 1   Feeling bad or failure about yourself  - - 3 1 1   Trouble concentrating - - 3 2 3   Moving slowly or fidgety/restless - - 3 2 2   Suicidal thoughts - - 3 0 1  PHQ-9 Score - - 27 15 16   Difficult doing work/chores - - - - Somewhat difficult     Relevant past medical, surgical, family and social history reviewed and updated as indicated. Interim medical history since our last visit reviewed. Allergies and medications reviewed and updated.    ROS: Per HPI unless specifically indicated above  History  Smoking status  . Former Smoker -- 1.00 packs/day for 20 years  . Types: Cigarettes  . Quit date: 03/02/1974  Smokeless tobacco  . Never Used    Past Medical History Patient Active Problem List   Diagnosis Date Noted  . Weakness 02/25/2015  . SIRS (systemic inflammatory response syndrome) (HCC) 02/25/2015  . Urinary tract infection, site not specified 02/25/2015  . Fall 02/25/2015  . Hypertension 02/25/2015  . AKI (acute kidney injury) (HCC) 02/25/2015  . Pyrexia   . Acute urinary retention 05/30/2014  . Hematuria 05/29/2014  . Elevated lactic acid level 05/26/2014  . Hypokalemia 05/26/2014  . Acute  gout   . Encephalopathy acute 05/06/2014  . UTI (lower urinary tract infection) 05/06/2014  . Acute renal insufficiency 05/06/2014  . Dehydration 05/06/2014  . FTT (failure to thrive) in adult 05/06/2014  . Renal insufficiency   . Diabetes mellitus due to underlying condition without complications (HCC)   . COPD exacerbation (HCC) 04/11/2014  . Chronic cough 04/10/2014  . Carotid artery occlusion 04/10/2014  . CVA (cerebral vascular accident) (HCC) 04/10/2014  . Anemia 04/10/2014  . Dementia with behavioral disturbance 11/15/2013  . Benign paroxysmal positional vertigo 01/30/2013  . Adjustment reaction of adult life 01/30/2013  . Need for prophylactic vaccination and inoculation against influenza 12/09/2012  . Seizure (HCC) 05/14/2011  . CAD (coronary artery disease) of artery bypass graft   . Hyperlipidemia   . Peripheral vascular disease (HCC)   . Occlusion and stenosis of carotid artery without mention of cerebral infarction 02/11/2011        Objective:    BP 136/76 mmHg  Pulse 123  Temp(Src) 96.8 F (36 C) (Oral)  Ht 5\' 11"  (1.803 m)  Wt 189 lb 3.2 oz (85.821 kg)  BMI 26.40 kg/m2  Wt Readings from Last 3 Encounters:  05/08/15 189 lb 3.2 oz (85.821 kg)  05/08/15 186 lb (84.369 kg)  04/23/15 193 lb (87.544 kg)    Gen: NAD, alert, cooperative with exam,  NCAT EYES: EOMI, no scleral injection or icterus ENT:  TMs dull gray b/l, OP with milderythema LYMPH: no cervical LAD CV: NRRR, normal S1/S2, no murmur, distal pulses 2+ b/l Resp: CTABL, no wheezes, normal WOB Abd: +BS, soft, NTND. no guarding or organomegaly Ext: No edema, warm Neuro: Alert and oriented MSK: normal muscle bulk     Assessment & Plan:    Zyair was seen today for chest congestion, headache and emesis, flu negative.   Diagnoses and all orders for this visit:  Flu-like symptoms -     Veritor Flu A/B Waived  Acute bronchitis, unspecified organism -     azithromycin (ZITHROMAX) 250 MG  tablet; Take 2 the first day and then one each day after.   Follow up plan: Return in about 1 week (around 05/15/2015) for needs appt for headache eval if headache is not improved after this illness.  Rex Kras, MD [facility] 05/08/2015, 5:48 PM

## 2015-05-08 NOTE — Progress Notes (Signed)
[facility]  Patient name: [name] MRN: [id number] DOB: Nov 03, 1931 Sex: male  REASON FOR VISIT: Follow up of carotid disease.  HPI: [name] is a 82 y.o. male who underwent a right carotid endarterectomy in March 2013. He previously had a left carotid endarterectomy in February 2013. He comes in for yearly follow up visit. I last saw him in April 2015.  Since that time, he denies any history of stroke, TIAs, expressive or receptive aphasia, or amaurosis fugax.  He has had a productive cough recently but denies fever.  Past Medical History  Diagnosis Date  . Diabetes mellitus type 2   . GERD (gastroesophageal reflux disease)   . Hyperlipidemia     takes Niacin nightly  . DJD (degenerative joint disease)   . Hypertension     takes Metoprolol and Ramipril daily  . Dizziness   . Gout     hx of--67yrs ago  . Bruises easily     pt is on Plavix and ASA  . Shortness of breath     wakes pt up during night with occ shortness of breathe;states it happens about once a month  . Impaired hearing   . Urinary frequency   . UTI (lower urinary tract infection)     hx of--03/2010  . Anemia     takes Glyburide bid  . CAD (coronary artery disease) of artery bypass graft   . Pneumonia     "at least twice that I know of"  . Seizures (HCC) 05/14/11    "first time was today"  . CVA (cerebral vascular accident) [facility] ~ [facility]    right arm/leg occ hurts  . Carotid artery occlusion 2013    s/p bilateral endarterectomy  . Chronic headache   . Chronic neck pain   . Dementia with behavioral disturbance   . Dyspnea     chronic, daily    Family History  Problem Relation Age of Onset  . Diabetes Sister   . Cancer Brother   . Diabetes Brother   . Anesthesia problems Neg Hx   . Hypotension Neg Hx   . Malignant hyperthermia Neg Hx   . Pseudochol deficiency Neg Hx   . Hearing loss Father     SOCIAL HISTORY: Social History  Substance Use  Topics  . Smoking status: Former Smoker -- 1.00 packs/day for 20 years    Types: Cigarettes    Quit date: 03/02/1974  . Smokeless tobacco: Never Used  . Alcohol Use: No     Comment: quit in 1981    Allergies  Allergen Reactions  . Fexofenadine Hcl Other (See Comments)    Heart attack symptoms  . Flonase [Fluticasone Propionate] Other (See Comments)    Heart attack symptoms  . Nasal Spray Other (See Comments)    Heart attack symptoms   . Nasal Spray     CAN NOT TOLERATE ANY NASAL SPRAYS!!!!!  Causes heart attack symptoms    Current Outpatient Prescriptions  Medication Sig Dispense Refill  . albuterol (PROVENTIL) (2.5 MG/3ML) 0.083% nebulizer solution Take 3 mLs (2.5 mg total) by nebulization every 6 (six) hours as needed for wheezing or shortness of breath. 160 mL 0  . allopurinol (ZYLOPRIM) 300 MG tablet TAKE 1 TABLET DAILY 90 tablet 1  . aspirin EC 81 MG tablet Take 81 mg by mouth 2 (two) times daily.    . clopidogrel (PLAVIX) 75 MG tablet TAKE 1 TABLET DAILY 90 tablet 2  .  donepezil (ARICEPT) 10 MG tablet TAKE 1 TABLET AT BEDTIME 90 tablet 0  . finasteride (PROSCAR) 5 MG tablet TAKE 1 TABLET DAILY 90 tablet 1  . furosemide (LASIX) 20 MG tablet TAKE 1 TABLET DAILY AS NEEDED FOR FLUID 90 tablet 1  . glyBURIDE (DIABETA) 2.5 MG tablet Take 1 tablet (2.5 mg total) by mouth 2 (two) times daily with a meal. 20 tablet 0  . ibuprofen (ADVIL,MOTRIN) 200 MG tablet Take 200 mg by mouth every 6 (six) hours as needed for headache or mild pain.     Marland Kitchen LORazepam (ATIVAN) 0.5 MG tablet Take 1 tablet (0.5 mg total) by mouth 2 (two) times daily as needed for anxiety. 180 tablet 0  . memantine (NAMENDA) 10 MG tablet Take 10 mg by mouth 2 (two) times daily.    . metFORMIN (GLUCOPHAGE) 1000 MG tablet Take 1 tablet (1,000 mg total) by mouth 2 (two) times daily with a meal. 180 tablet 3  . metoprolol (LOPRESSOR) 50 MG tablet Take 1 tablet (50 mg total) by mouth 2 (two) times daily. 14 tablet 0  .  QUEtiapine (SEROQUEL) 50 MG tablet TAKE 2 TABLETS IN THE MORNING AND 3 TABLETS AT BEDTIME 150 tablet 2  . Umeclidinium Bromide (INCRUSE ELLIPTA) 62.5 MCG/INH AEPB Inhale 1 Inhaler into the lungs daily. 1 each 2   No current facility-administered medications for this visit.    REVIEW OF SYSTEMS:   denotes positive finding,  denotes negative finding Cardiac  Comments:  Chest pain or chest pressure:    Shortness of breath upon exertion:    Short of breath when lying flat:    Irregular heart rhythm:        Vascular    Pain in calf, thigh, or hip brought on by ambulation:    Pain in feet at night that wakes you up from your sleep:     Blood clot in your veins:    Leg swelling:         Pulmonary    Oxygen at home:    Productive cough:  X   Wheezing:         Neurologic    Sudden weakness in arms or legs:     Sudden numbness in arms or legs:     Sudden onset of difficulty speaking or slurred speech:    Temporary loss of vision in one eye:     Problems with dizziness:         Gastrointestinal    Blood in stool:     Vomited blood:         Genitourinary    Burning when urinating:     Blood in urine:        Psychiatric    Major depression:         Hematologic    Bleeding problems:    Problems with blood clotting too easily:        Skin    Rashes or ulcers:        Constitutional    Fever or chills:      PHYSICAL EXAM: Filed Vitals:   05/08/15 1131 05/08/15 1137  BP: 138/73 137/76  Pulse: 125 124  Temp: 97.6 F (36.4 C)   Resp: 16   Height:  (1.803 m)   Weight: 186 lb (84.369 kg)   SpO2: 96%     GENERAL: The patient is a well-nourished male, in no acute distress. The vital signs are documented above. CARDIAC: There is a  regular rate and rhythm.  VASCULAR: I do not detect carotid bruits. PULMONARY: There is good air exchange bilaterally without wheezing.  ABDOMEN: Soft and non-tender with normal pitched bowel sounds.  MUSCULOSKELETAL: There are no  major deformities or cyanosis. NEUROLOGIC: No focal weakness or paresthesias are detected. SKIN: There are no ulcers or rashes noted. PSYCHIATRIC: The patient has a normal affect.  DATA:   CAROTID DUPLEX: I have been the [facility] interpreted his carotid duplex scan. Both carotid endarterectomy sites are widely patent without evidence of restenosis. Both for Teeple arteries are patent with antegrade flow.  MEDICAL ISSUES:  STATUS POST BILATERAL CAROTID ENDARTERECTOMIES: the patient underwent staged bilateral carotid endarterectomies in 2013. He comes in for yearly follow up visit and both carotid endarterectomy sites are widely patent. He is on aspirin and is on Plavix. He does not take a statin.  I will order to follow carotid duplex scan in 1 year and I'll see him back at that time. He will see the nurse practitioner at that time.  Waverly Ferrariickson, [place] [facility] [place]: [phone]

## 2015-05-08 NOTE — Addendum Note (Signed)
Addended by: Dannielle KarvonenMANESS-HARRISON, Jacie Tristan C on: 05/08/2015 02:56 PM   Modules accepted: Orders

## 2015-05-16 ENCOUNTER — Other Ambulatory Visit: Payer: Self-pay | Admitting: Cardiology

## 2015-05-16 NOTE — Telephone Encounter (Signed)
REFILL 

## 2015-06-01 DEATH — deceased

## 2016-05-13 ENCOUNTER — Ambulatory Visit: Payer: Medicare Other | Admitting: Family

## 2016-05-13 ENCOUNTER — Encounter (HOSPITAL_COMMUNITY): Payer: Medicare Other
